# Patient Record
Sex: Male | Born: 1940 | Race: White | Hispanic: No | Marital: Married | Smoking: Former smoker
Health system: Southern US, Community
[De-identification: ages and names within clinical notes are randomized; demographics above are authoritative.]

## PROBLEM LIST (undated history)

## (undated) DIAGNOSIS — E785 Hyperlipidemia, unspecified: Secondary | ICD-10-CM

## (undated) DIAGNOSIS — E669 Obesity, unspecified: Secondary | ICD-10-CM

## (undated) DIAGNOSIS — E119 Type 2 diabetes mellitus without complications: Secondary | ICD-10-CM

## (undated) DIAGNOSIS — I6529 Occlusion and stenosis of unspecified carotid artery: Secondary | ICD-10-CM

## (undated) DIAGNOSIS — T4145XA Adverse effect of unspecified anesthetic, initial encounter: Secondary | ICD-10-CM

## (undated) DIAGNOSIS — I10 Essential (primary) hypertension: Secondary | ICD-10-CM

## (undated) DIAGNOSIS — I2510 Atherosclerotic heart disease of native coronary artery without angina pectoris: Secondary | ICD-10-CM

## (undated) DIAGNOSIS — T8859XA Other complications of anesthesia, initial encounter: Secondary | ICD-10-CM

## (undated) DIAGNOSIS — N529 Male erectile dysfunction, unspecified: Secondary | ICD-10-CM

## (undated) DIAGNOSIS — E1159 Type 2 diabetes mellitus with other circulatory complications: Secondary | ICD-10-CM

## (undated) DIAGNOSIS — I219 Acute myocardial infarction, unspecified: Secondary | ICD-10-CM

## (undated) DIAGNOSIS — I483 Typical atrial flutter: Secondary | ICD-10-CM

## (undated) DIAGNOSIS — I480 Paroxysmal atrial fibrillation: Secondary | ICD-10-CM

## (undated) DIAGNOSIS — G4733 Obstructive sleep apnea (adult) (pediatric): Secondary | ICD-10-CM

## (undated) HISTORY — DX: Obstructive sleep apnea (adult) (pediatric): G47.33

## (undated) HISTORY — DX: Occlusion and stenosis of unspecified carotid artery: I65.29

## (undated) HISTORY — PX: TONSILLECTOMY: SUR1361

## (undated) HISTORY — DX: Typical atrial flutter: I48.3

## (undated) HISTORY — PX: CARDIAC CATHETERIZATION: SHX172

## (undated) HISTORY — DX: Male erectile dysfunction, unspecified: N52.9

## (undated) HISTORY — DX: Atherosclerotic heart disease of native coronary artery without angina pectoris: I25.10

## (undated) HISTORY — DX: Hyperlipidemia, unspecified: E78.5

## (undated) HISTORY — DX: Essential (primary) hypertension: I10

## (undated) HISTORY — DX: Type 2 diabetes mellitus without complications: E11.9

## (undated) HISTORY — DX: Paroxysmal atrial fibrillation: I48.0

## (undated) HISTORY — DX: Obesity, unspecified: E66.9

## (undated) HISTORY — DX: Type 2 diabetes mellitus with other circulatory complications: E11.59

---
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
## 1963-09-03 HISTORY — PX: HAND SURGERY: SHX662

## 1991-09-03 HISTORY — PX: ANKLE FRACTURE SURGERY: SHX122

## 2000-09-02 HISTORY — PX: COLONOSCOPY: SHX174

## 2001-04-13 ENCOUNTER — Ambulatory Visit (HOSPITAL_COMMUNITY): Admission: RE | Admit: 2001-04-13 | Discharge: 2001-04-13 | Payer: Self-pay | Admitting: Internal Medicine

## 2005-05-14 ENCOUNTER — Ambulatory Visit: Payer: Self-pay | Admitting: Internal Medicine

## 2005-06-24 ENCOUNTER — Ambulatory Visit: Payer: Self-pay | Admitting: Internal Medicine

## 2005-09-02 DIAGNOSIS — I219 Acute myocardial infarction, unspecified: Secondary | ICD-10-CM

## 2005-09-02 HISTORY — DX: Acute myocardial infarction, unspecified: I21.9

## 2006-06-09 ENCOUNTER — Ambulatory Visit: Payer: Self-pay | Admitting: Internal Medicine

## 2006-06-09 LAB — CONVERTED CEMR LAB
ALT: 25 units/L (ref 0–40)
AST: 23 units/L (ref 0–37)
Albumin: 4.1 g/dL (ref 3.5–5.2)
Alkaline Phosphatase: 62 units/L (ref 39–117)
BUN: 10 mg/dL (ref 6–23)
Bacteria, U Microscopic: NEGATIVE /hpf
Basophils Absolute: 0 10*3/uL (ref 0.0–0.1)
Basophils Relative: 0.6 % (ref 0.0–1.0)
Bilirubin Urine: NEGATIVE
CO2: 29 meq/L (ref 19–32)
Calcium: 9.6 mg/dL (ref 8.4–10.5)
Chloride: 105 meq/L (ref 96–112)
Chol/HDL Ratio, serum: 4.6
Cholesterol: 181 mg/dL (ref 0–200)
Creatinine, Ser: 1.1 mg/dL (ref 0.4–1.5)
Crystals: NEGATIVE
Eosinophil percent: 1.2 % (ref 0.0–5.0)
GFR calc non Af Amer: 71 mL/min
Glomerular Filtration Rate, Af Am: 86 mL/min/{1.73_m2}
Glucose, Bld: 122 mg/dL — ABNORMAL HIGH (ref 70–99)
HCT: 46.4 % (ref 39.0–52.0)
HDL: 39.7 mg/dL (ref >39.0)
Hemoglobin, Urine: NEGATIVE
Hemoglobin: 15.3 g/dL (ref 13.0–17.0)
Hgb A1c MFr Bld: 6.1 % — ABNORMAL HIGH (ref 4.6–6.0)
Ketones, ur: NEGATIVE mg/dL
LDL Cholesterol: 108 mg/dL — ABNORMAL HIGH (ref 0–99)
Lymphocytes Relative: 31 % (ref 12.0–46.0)
MCHC: 32.9 g/dL (ref 30.0–36.0)
MCV: 88 fL (ref 78.0–100.0)
Monocytes Absolute: 0.7 10*3/uL (ref 0.2–0.7)
Monocytes Relative: 8.7 % (ref 3.0–11.0)
Neutro Abs: 4.7 10*3/uL (ref 1.4–7.7)
Neutrophils Relative %: 58.5 % (ref 43.0–77.0)
Nitrite: NEGATIVE
PSA: 1.52 ng/mL (ref 0.10–4.00)
Platelets: 247 10*3/uL (ref 150–400)
Potassium: 4 meq/L (ref 3.5–5.1)
RBC / HPF: NONE SEEN
RBC: 5.28 M/uL (ref 4.22–5.81)
RDW: 13.6 % (ref 11.5–14.6)
Sodium: 144 meq/L (ref 135–145)
Specific Gravity, Urine: 1.025 (ref 1.000–1.03)
TSH: 1.95 microintl units/mL (ref 0.35–5.50)
Total Bilirubin: 0.7 mg/dL (ref 0.3–1.2)
Total Protein, Urine: NEGATIVE mg/dL
Total Protein: 7.1 g/dL (ref 6.0–8.3)
Triglyceride fasting, serum: 167 mg/dL — ABNORMAL HIGH (ref 0–149)
Urine Glucose: NEGATIVE mg/dL
Urobilinogen, UA: 0.2 (ref 0.0–1.0)
VLDL: 33 mg/dL (ref 0–40)
WBC: 8 10*3/uL (ref 4.5–10.5)
pH: 5.5 (ref 5.0–8.0)

## 2006-06-30 ENCOUNTER — Ambulatory Visit: Payer: Self-pay | Admitting: Internal Medicine

## 2006-07-07 ENCOUNTER — Ambulatory Visit: Payer: Self-pay | Admitting: Internal Medicine

## 2006-07-08 ENCOUNTER — Ambulatory Visit: Payer: Self-pay | Admitting: Internal Medicine

## 2006-07-09 ENCOUNTER — Ambulatory Visit: Payer: Self-pay | Admitting: Internal Medicine

## 2006-07-09 ENCOUNTER — Inpatient Hospital Stay (HOSPITAL_BASED_OUTPATIENT_CLINIC_OR_DEPARTMENT_OTHER): Admission: RE | Admit: 2006-07-09 | Discharge: 2006-07-09 | Payer: Self-pay | Admitting: Internal Medicine

## 2006-07-17 ENCOUNTER — Ambulatory Visit: Payer: Self-pay | Admitting: Cardiology

## 2006-07-22 ENCOUNTER — Encounter (HOSPITAL_COMMUNITY): Admission: RE | Admit: 2006-07-22 | Discharge: 2006-10-20 | Payer: Self-pay | Admitting: Internal Medicine

## 2006-07-28 ENCOUNTER — Ambulatory Visit: Payer: Self-pay | Admitting: Internal Medicine

## 2006-08-11 ENCOUNTER — Ambulatory Visit: Payer: Self-pay | Admitting: Internal Medicine

## 2006-09-09 ENCOUNTER — Ambulatory Visit: Payer: Self-pay | Admitting: Internal Medicine

## 2006-10-13 ENCOUNTER — Ambulatory Visit: Payer: Self-pay | Admitting: Internal Medicine

## 2006-10-13 LAB — CONVERTED CEMR LAB
ALT: 31 units/L (ref 0–40)
AST: 26 units/L (ref 0–37)
Albumin: 3.8 g/dL (ref 3.5–5.2)
Alkaline Phosphatase: 54 units/L (ref 39–117)
Bilirubin, Direct: 0.1 mg/dL (ref 0.0–0.3)
Cholesterol: 100 mg/dL (ref 0–200)
HDL: 37.3 mg/dL — ABNORMAL LOW (ref >39.0)
LDL Cholesterol: 42 mg/dL (ref 0–99)
Total Bilirubin: 0.9 mg/dL (ref 0.3–1.2)
Total CHOL/HDL Ratio: 2.7
Total Protein: 6.6 g/dL (ref 6.0–8.3)
Triglycerides: 103 mg/dL (ref 0–149)
VLDL: 21 mg/dL (ref 0–40)

## 2006-10-20 ENCOUNTER — Ambulatory Visit: Payer: Self-pay | Admitting: Internal Medicine

## 2006-10-21 ENCOUNTER — Encounter (HOSPITAL_COMMUNITY): Admission: RE | Admit: 2006-10-21 | Discharge: 2006-10-27 | Payer: Self-pay | Admitting: Internal Medicine

## 2006-10-24 ENCOUNTER — Ambulatory Visit: Payer: Self-pay | Admitting: Internal Medicine

## 2006-10-24 LAB — CONVERTED CEMR LAB
BUN: 16 mg/dL (ref 6–23)
CO2: 28 meq/L (ref 19–32)
Calcium: 9.1 mg/dL (ref 8.4–10.5)
Chloride: 106 meq/L (ref 96–112)
Creatinine, Ser: 0.9 mg/dL (ref 0.4–1.5)
GFR calc Af Amer: 109 mL/min
GFR calc non Af Amer: 90 mL/min
Glucose, Bld: 115 mg/dL — ABNORMAL HIGH (ref 70–99)
Hgb A1c MFr Bld: 6.2 % — ABNORMAL HIGH (ref 4.6–6.0)
Potassium: 4 meq/L (ref 3.5–5.1)
Sodium: 140 meq/L (ref 135–145)

## 2006-10-28 ENCOUNTER — Encounter (HOSPITAL_COMMUNITY): Admission: RE | Admit: 2006-10-28 | Discharge: 2007-01-26 | Payer: Self-pay | Admitting: Internal Medicine

## 2006-11-03 ENCOUNTER — Ambulatory Visit: Payer: Self-pay | Admitting: Internal Medicine

## 2007-01-20 ENCOUNTER — Ambulatory Visit: Payer: Self-pay | Admitting: Internal Medicine

## 2007-01-21 ENCOUNTER — Ambulatory Visit: Payer: Self-pay | Admitting: Internal Medicine

## 2007-02-02 ENCOUNTER — Encounter (HOSPITAL_COMMUNITY): Admission: RE | Admit: 2007-02-02 | Discharge: 2007-05-03 | Payer: Self-pay | Admitting: Internal Medicine

## 2007-02-08 LAB — CONVERTED CEMR LAB
ALT: 29 units/L (ref 0–40)
AST: 25 units/L (ref 0–37)
Albumin: 4 g/dL (ref 3.5–5.2)
Alkaline Phosphatase: 56 units/L (ref 39–117)
BUN: 12 mg/dL (ref 6–23)
Bilirubin, Direct: 0.1 mg/dL (ref 0.0–0.3)
CO2: 27 meq/L (ref 19–32)
Calcium: 8.8 mg/dL (ref 8.4–10.5)
Chloride: 111 meq/L (ref 96–112)
Cholesterol: 111 mg/dL (ref 0–200)
Creatinine, Ser: 0.9 mg/dL (ref 0.4–1.5)
GFR calc Af Amer: 109 mL/min
GFR calc non Af Amer: 90 mL/min
Glucose, Bld: 100 mg/dL — ABNORMAL HIGH (ref 70–99)
HDL: 33.5 mg/dL — ABNORMAL LOW (ref >39.0)
Hgb A1c MFr Bld: 6.1 % — ABNORMAL HIGH (ref 4.6–6.0)
LDL Cholesterol: 54 mg/dL (ref 0–99)
Potassium: 4.1 meq/L (ref 3.5–5.1)
Sodium: 143 meq/L (ref 135–145)
Total Bilirubin: 0.9 mg/dL (ref 0.3–1.2)
Total CHOL/HDL Ratio: 3.3
Total Protein: 6.8 g/dL (ref 6.0–8.3)
Triglycerides: 118 mg/dL (ref 0–149)
VLDL: 24 mg/dL (ref 0–40)

## 2007-02-19 ENCOUNTER — Ambulatory Visit: Payer: Self-pay | Admitting: Internal Medicine

## 2007-02-19 DIAGNOSIS — E8881 Metabolic syndrome: Secondary | ICD-10-CM | POA: Insufficient documentation

## 2007-02-19 DIAGNOSIS — I2510 Atherosclerotic heart disease of native coronary artery without angina pectoris: Secondary | ICD-10-CM | POA: Insufficient documentation

## 2007-05-04 ENCOUNTER — Encounter (HOSPITAL_COMMUNITY): Admission: RE | Admit: 2007-05-04 | Discharge: 2007-08-02 | Payer: Self-pay | Admitting: Internal Medicine

## 2007-05-07 ENCOUNTER — Ambulatory Visit: Payer: Self-pay | Admitting: Internal Medicine

## 2007-05-07 ENCOUNTER — Ambulatory Visit: Payer: Self-pay

## 2007-05-07 ENCOUNTER — Encounter: Payer: Self-pay | Admitting: Internal Medicine

## 2007-05-11 ENCOUNTER — Ambulatory Visit: Payer: Self-pay | Admitting: Internal Medicine

## 2007-05-11 ENCOUNTER — Ambulatory Visit: Payer: Self-pay

## 2007-05-29 ENCOUNTER — Encounter (INDEPENDENT_AMBULATORY_CARE_PROVIDER_SITE_OTHER): Payer: Self-pay | Admitting: *Deleted

## 2007-05-29 LAB — CONVERTED CEMR LAB
ALT: 35 units/L (ref 0–53)
AST: 29 units/L (ref 0–37)
Albumin: 3.9 g/dL (ref 3.5–5.2)
Alkaline Phosphatase: 61 units/L (ref 39–117)
BUN: 11 mg/dL (ref 6–23)
Bilirubin, Direct: 0.2 mg/dL (ref 0.0–0.3)
CO2: 29 meq/L (ref 19–32)
Calcium: 9.3 mg/dL (ref 8.4–10.5)
Chloride: 108 meq/L (ref 96–112)
Cholesterol: 108 mg/dL (ref 0–200)
Creatinine, Ser: 0.9 mg/dL (ref 0.4–1.5)
GFR calc Af Amer: 109 mL/min
GFR calc non Af Amer: 90 mL/min
Glucose, Bld: 105 mg/dL — ABNORMAL HIGH (ref 70–99)
HDL: 43.3 mg/dL (ref >39.0)
LDL Cholesterol: 47 mg/dL (ref 0–99)
Potassium: 4.1 meq/L (ref 3.5–5.1)
Sodium: 142 meq/L (ref 135–145)
Total Bilirubin: 0.9 mg/dL (ref 0.3–1.2)
Total CHOL/HDL Ratio: 2.5
Total Protein: 6.6 g/dL (ref 6.0–8.3)
Triglycerides: 90 mg/dL (ref 0–149)
VLDL: 18 mg/dL (ref 0–40)

## 2007-06-11 ENCOUNTER — Ambulatory Visit: Payer: Self-pay

## 2007-06-14 LAB — CONVERTED CEMR LAB
Hgb A1c MFr Bld: 6 % (ref 4.6–6.0)
PSA: 1.29 ng/mL (ref 0.10–4.00)

## 2007-06-15 ENCOUNTER — Encounter (INDEPENDENT_AMBULATORY_CARE_PROVIDER_SITE_OTHER): Payer: Self-pay | Admitting: *Deleted

## 2007-07-23 ENCOUNTER — Ambulatory Visit: Payer: Self-pay | Admitting: Internal Medicine

## 2007-07-23 DIAGNOSIS — F528 Other sexual dysfunction not due to a substance or known physiological condition: Secondary | ICD-10-CM

## 2007-07-23 DIAGNOSIS — N410 Acute prostatitis: Secondary | ICD-10-CM | POA: Insufficient documentation

## 2007-07-23 DIAGNOSIS — R300 Dysuria: Secondary | ICD-10-CM | POA: Insufficient documentation

## 2007-07-23 HISTORY — DX: Other sexual dysfunction not due to a substance or known physiological condition: F52.8

## 2007-07-23 LAB — CONVERTED CEMR LAB
Bilirubin Urine: NEGATIVE
Glucose, Urine, Semiquant: NEGATIVE
Ketones, urine, test strip: NEGATIVE
Nitrite: NEGATIVE
Protein, U semiquant: NEGATIVE
Specific Gravity, Urine: 1.02
Urobilinogen, UA: NEGATIVE
pH: 5

## 2007-07-24 ENCOUNTER — Encounter: Payer: Self-pay | Admitting: Internal Medicine

## 2007-07-28 ENCOUNTER — Telehealth (INDEPENDENT_AMBULATORY_CARE_PROVIDER_SITE_OTHER): Payer: Self-pay | Admitting: *Deleted

## 2007-08-03 ENCOUNTER — Encounter (HOSPITAL_COMMUNITY): Admission: RE | Admit: 2007-08-03 | Discharge: 2007-09-01 | Payer: Self-pay | Admitting: Internal Medicine

## 2007-08-10 ENCOUNTER — Ambulatory Visit: Payer: Self-pay | Admitting: Internal Medicine

## 2007-08-10 DIAGNOSIS — N390 Urinary tract infection, site not specified: Secondary | ICD-10-CM | POA: Insufficient documentation

## 2007-08-10 LAB — CONVERTED CEMR LAB

## 2007-08-11 ENCOUNTER — Encounter: Payer: Self-pay | Admitting: Internal Medicine

## 2007-08-17 ENCOUNTER — Ambulatory Visit: Payer: Self-pay | Admitting: Internal Medicine

## 2007-09-03 ENCOUNTER — Encounter (HOSPITAL_COMMUNITY): Admission: RE | Admit: 2007-09-03 | Discharge: 2007-11-26 | Payer: Self-pay | Admitting: Internal Medicine

## 2007-09-09 ENCOUNTER — Ambulatory Visit: Payer: Self-pay | Admitting: Internal Medicine

## 2007-09-09 LAB — CONVERTED CEMR LAB
ALT: 34 units/L (ref 0–53)
AST: 31 units/L (ref 0–37)
Albumin: 4.2 g/dL (ref 3.5–5.2)
Alkaline Phosphatase: 60 units/L (ref 39–117)
BUN: 13 mg/dL (ref 6–23)
Bilirubin, Direct: 0.1 mg/dL (ref 0.0–0.3)
CO2: 27 meq/L (ref 19–32)
Calcium: 9.3 mg/dL (ref 8.4–10.5)
Chloride: 105 meq/L (ref 96–112)
Cholesterol: 121 mg/dL (ref 0–200)
Creatinine, Ser: 1 mg/dL (ref 0.4–1.5)
GFR calc Af Amer: 96 mL/min
GFR calc non Af Amer: 79 mL/min
Glucose, Bld: 102 mg/dL — ABNORMAL HIGH (ref 70–99)
HDL: 44.7 mg/dL (ref >39.0)
Hgb A1c MFr Bld: 6.1 % — ABNORMAL HIGH (ref 4.6–6.0)
LDL Cholesterol: 49 mg/dL (ref 0–99)
Potassium: 3.9 meq/L (ref 3.5–5.1)
Sodium: 140 meq/L (ref 135–145)
Total Bilirubin: 1.2 mg/dL (ref 0.3–1.2)
Total CHOL/HDL Ratio: 2.7
Total Protein: 7 g/dL (ref 6.0–8.3)
Triglycerides: 137 mg/dL (ref 0–149)
VLDL: 27 mg/dL (ref 0–40)

## 2007-09-14 ENCOUNTER — Ambulatory Visit: Payer: Self-pay | Admitting: Internal Medicine

## 2007-09-28 ENCOUNTER — Encounter: Payer: Self-pay | Admitting: Internal Medicine

## 2007-12-02 ENCOUNTER — Encounter (HOSPITAL_COMMUNITY): Admission: RE | Admit: 2007-12-02 | Discharge: 2008-03-01 | Payer: Self-pay | Admitting: Internal Medicine

## 2007-12-14 ENCOUNTER — Ambulatory Visit: Payer: Self-pay | Admitting: Internal Medicine

## 2007-12-14 LAB — CONVERTED CEMR LAB
ALT: 26 units/L (ref 0–53)
AST: 23 units/L (ref 0–37)
Albumin: 3.8 g/dL (ref 3.5–5.2)
Alkaline Phosphatase: 54 units/L (ref 39–117)
BUN: 14 mg/dL (ref 6–23)
Bilirubin, Direct: 0.1 mg/dL (ref 0.0–0.3)
CO2: 28 meq/L (ref 19–32)
Calcium: 9.1 mg/dL (ref 8.4–10.5)
Chloride: 107 meq/L (ref 96–112)
Cholesterol: 96 mg/dL (ref 0–200)
Creatinine, Ser: 1 mg/dL (ref 0.4–1.5)
GFR calc Af Amer: 96 mL/min
GFR calc non Af Amer: 79 mL/min
Glucose, Bld: 111 mg/dL — ABNORMAL HIGH (ref 70–99)
HDL: 38.4 mg/dL — ABNORMAL LOW (ref >39.0)
LDL Cholesterol: 43 mg/dL (ref 0–99)
Potassium: 4.2 meq/L (ref 3.5–5.1)
Sodium: 143 meq/L (ref 135–145)
Total Bilirubin: 0.9 mg/dL (ref 0.3–1.2)
Total CHOL/HDL Ratio: 2.5
Total Protein: 6.6 g/dL (ref 6.0–8.3)
Triglycerides: 74 mg/dL (ref 0–149)
VLDL: 15 mg/dL (ref 0–40)

## 2007-12-18 ENCOUNTER — Ambulatory Visit: Payer: Self-pay | Admitting: Internal Medicine

## 2008-01-08 ENCOUNTER — Ambulatory Visit: Payer: Self-pay | Admitting: Internal Medicine

## 2008-01-08 DIAGNOSIS — S3981XA Other specified injuries of abdomen, initial encounter: Secondary | ICD-10-CM | POA: Insufficient documentation

## 2008-01-08 DIAGNOSIS — K439 Ventral hernia without obstruction or gangrene: Secondary | ICD-10-CM | POA: Insufficient documentation

## 2008-01-08 HISTORY — DX: Ventral hernia without obstruction or gangrene: K43.9

## 2008-03-02 ENCOUNTER — Encounter (HOSPITAL_COMMUNITY): Admission: RE | Admit: 2008-03-02 | Discharge: 2008-05-31 | Payer: Self-pay | Admitting: Internal Medicine

## 2008-03-21 ENCOUNTER — Ambulatory Visit: Payer: Self-pay | Admitting: Internal Medicine

## 2008-03-21 LAB — CONVERTED CEMR LAB
ALT: 32 units/L (ref 0–53)
AST: 26 units/L (ref 0–37)
Albumin: 3.9 g/dL (ref 3.5–5.2)
Alkaline Phosphatase: 67 units/L (ref 39–117)
BUN: 13 mg/dL (ref 6–23)
Bilirubin, Direct: 0.1 mg/dL (ref 0.0–0.3)
CO2: 29 meq/L (ref 19–32)
Calcium: 9 mg/dL (ref 8.4–10.5)
Chloride: 108 meq/L (ref 96–112)
Cholesterol: 109 mg/dL (ref 0–200)
Creatinine, Ser: 0.9 mg/dL (ref 0.4–1.5)
Direct LDL: 50.7 mg/dL
GFR calc Af Amer: 108 mL/min
GFR calc non Af Amer: 89 mL/min
Glucose, Bld: 117 mg/dL — ABNORMAL HIGH (ref 70–99)
HDL: 38.9 mg/dL — ABNORMAL LOW (ref >39.0)
Hgb A1c MFr Bld: 6.1 % — ABNORMAL HIGH (ref 4.6–6.0)
PSA: 1.44 ng/mL (ref 0.10–4.00)
Potassium: 4.1 meq/L (ref 3.5–5.1)
Sodium: 142 meq/L (ref 135–145)
Total Bilirubin: 1.2 mg/dL (ref 0.3–1.2)
Total CHOL/HDL Ratio: 2.8
Total Protein: 6.7 g/dL (ref 6.0–8.3)
Triglycerides: 94 mg/dL (ref 0–149)
VLDL: 19 mg/dL (ref 0–40)

## 2008-03-22 ENCOUNTER — Ambulatory Visit: Payer: Self-pay | Admitting: Internal Medicine

## 2008-03-24 ENCOUNTER — Ambulatory Visit: Payer: Self-pay | Admitting: Internal Medicine

## 2008-06-02 ENCOUNTER — Encounter (HOSPITAL_COMMUNITY): Admission: RE | Admit: 2008-06-02 | Discharge: 2008-08-29 | Payer: Self-pay | Admitting: Internal Medicine

## 2008-07-21 ENCOUNTER — Ambulatory Visit: Payer: Self-pay | Admitting: Internal Medicine

## 2008-07-21 LAB — CONVERTED CEMR LAB
ALT: 27 units/L (ref 0–53)
AST: 24 units/L (ref 0–37)
Albumin: 4 g/dL (ref 3.5–5.2)
Alkaline Phosphatase: 62 units/L (ref 39–117)
BUN: 13 mg/dL (ref 6–23)
Bilirubin, Direct: 0.1 mg/dL (ref 0.0–0.3)
CO2: 29 meq/L (ref 19–32)
Calcium: 9.3 mg/dL (ref 8.4–10.5)
Chloride: 106 meq/L (ref 96–112)
Creatinine, Ser: 0.9 mg/dL (ref 0.4–1.5)
GFR calc Af Amer: 108 mL/min
GFR calc non Af Amer: 89 mL/min
Glucose, Bld: 95 mg/dL (ref 70–99)
Potassium: 4.1 meq/L (ref 3.5–5.1)
Sodium: 141 meq/L (ref 135–145)
Total Bilirubin: 0.9 mg/dL (ref 0.3–1.2)
Total Protein: 6.7 g/dL (ref 6.0–8.3)

## 2008-07-25 ENCOUNTER — Ambulatory Visit: Payer: Self-pay | Admitting: Internal Medicine

## 2008-07-29 LAB — CONVERTED CEMR LAB
ALT: 27 units/L (ref 0–53)
AST: 24 units/L (ref 0–37)
Albumin: 4 g/dL (ref 3.5–5.2)
Alkaline Phosphatase: 62 units/L (ref 39–117)
BUN: 13 mg/dL (ref 6–23)
Bilirubin, Direct: 0.1 mg/dL (ref 0.0–0.3)
CO2: 29 meq/L (ref 19–32)
Calcium: 9.3 mg/dL (ref 8.4–10.5)
Chloride: 106 meq/L (ref 96–112)
Cholesterol: 108 mg/dL (ref 0–200)
Creatinine, Ser: 0.9 mg/dL (ref 0.4–1.5)
Direct LDL: 39.9 mg/dL
GFR calc Af Amer: 108 mL/min
GFR calc non Af Amer: 89 mL/min
Glucose, Bld: 95 mg/dL (ref 70–99)
HDL: 42.4 mg/dL (ref >39.0)
LDL Cholesterol: 41 mg/dL (ref 0–99)
Potassium: 4.1 meq/L (ref 3.5–5.1)
Sodium: 141 meq/L (ref 135–145)
Total Bilirubin: 0.9 mg/dL (ref 0.3–1.2)
Total CHOL/HDL Ratio: 2.5
Total Protein: 6.7 g/dL (ref 6.0–8.3)
Triglycerides: 123 mg/dL (ref 0–149)
VLDL: 25 mg/dL (ref 0–40)

## 2008-09-02 ENCOUNTER — Encounter (HOSPITAL_COMMUNITY): Admission: RE | Admit: 2008-09-02 | Discharge: 2008-12-01 | Payer: Self-pay | Admitting: Internal Medicine

## 2008-10-17 ENCOUNTER — Ambulatory Visit: Payer: Self-pay | Admitting: Internal Medicine

## 2008-10-20 ENCOUNTER — Ambulatory Visit: Payer: Self-pay | Admitting: Internal Medicine

## 2008-10-20 ENCOUNTER — Encounter: Payer: Self-pay | Admitting: Internal Medicine

## 2008-10-20 DIAGNOSIS — I10 Essential (primary) hypertension: Secondary | ICD-10-CM | POA: Insufficient documentation

## 2008-10-20 DIAGNOSIS — I2510 Atherosclerotic heart disease of native coronary artery without angina pectoris: Secondary | ICD-10-CM | POA: Insufficient documentation

## 2008-10-20 DIAGNOSIS — E785 Hyperlipidemia, unspecified: Secondary | ICD-10-CM | POA: Insufficient documentation

## 2008-10-20 HISTORY — DX: Atherosclerotic heart disease of native coronary artery without angina pectoris: I25.10

## 2008-10-20 HISTORY — DX: Hyperlipidemia, unspecified: E78.5

## 2008-11-14 LAB — CONVERTED CEMR LAB
ALT: 23 units/L (ref 0–53)
AST: 24 units/L (ref 0–37)
Albumin: 3.9 g/dL (ref 3.5–5.2)
Alkaline Phosphatase: 53 units/L (ref 39–117)
BUN: 12 mg/dL (ref 6–23)
Bilirubin, Direct: 0.1 mg/dL (ref 0.0–0.3)
CO2: 29 meq/L (ref 19–32)
Calcium: 9.3 mg/dL (ref 8.4–10.5)
Chloride: 108 meq/L (ref 96–112)
Cholesterol: 104 mg/dL (ref 0–200)
Creatinine, Ser: 0.9 mg/dL (ref 0.4–1.5)
Creatinine,U: 170.6 mg/dL
GFR calc Af Amer: 108 mL/min
GFR calc non Af Amer: 89 mL/min
Glucose, Bld: 111 mg/dL — ABNORMAL HIGH (ref 70–99)
HDL: 39.4 mg/dL (ref >39.0)
Hgb A1c MFr Bld: 6.1 % — ABNORMAL HIGH (ref 4.6–6.0)
LDL Cholesterol: 42 mg/dL (ref 0–99)
Microalb Creat Ratio: 4.7 mg/g (ref 0.0–30.0)
Microalb, Ur: 0.8 mg/dL (ref 0.0–1.9)
Potassium: 4.3 meq/L (ref 3.5–5.1)
Sodium: 143 meq/L (ref 135–145)
Total Bilirubin: 0.9 mg/dL (ref 0.3–1.2)
Total CHOL/HDL Ratio: 2.6
Total Protein: 6.8 g/dL (ref 6.0–8.3)
Triglycerides: 111 mg/dL (ref 0–149)
VLDL: 22 mg/dL (ref 0–40)

## 2008-12-01 ENCOUNTER — Ambulatory Visit: Payer: Self-pay | Admitting: Internal Medicine

## 2008-12-01 LAB — CONVERTED CEMR LAB
BUN: 14 mg/dL (ref 6–23)
CO2: 29 meq/L (ref 19–32)
Calcium: 8.8 mg/dL (ref 8.4–10.5)
Chloride: 106 meq/L (ref 96–112)
Creatinine, Ser: 0.9 mg/dL (ref 0.4–1.5)
GFR calc non Af Amer: 89.21 mL/min (ref >60)
Glucose, Bld: 116 mg/dL — ABNORMAL HIGH (ref 70–99)
Potassium: 3.7 meq/L (ref 3.5–5.1)
Sodium: 141 meq/L (ref 135–145)

## 2008-12-02 ENCOUNTER — Encounter (HOSPITAL_COMMUNITY): Admission: RE | Admit: 2008-12-02 | Discharge: 2009-03-02 | Payer: Self-pay | Admitting: Internal Medicine

## 2008-12-10 ENCOUNTER — Encounter: Payer: Self-pay | Admitting: Internal Medicine

## 2009-01-04 ENCOUNTER — Ambulatory Visit: Payer: Self-pay | Admitting: Internal Medicine

## 2009-01-04 LAB — CONVERTED CEMR LAB
ALT: 22 units/L (ref 0–53)
AST: 25 units/L (ref 0–37)
Albumin: 3.8 g/dL (ref 3.5–5.2)
Alkaline Phosphatase: 70 units/L (ref 39–117)
BUN: 12 mg/dL (ref 6–23)
Bilirubin, Direct: 0.1 mg/dL (ref 0.0–0.3)
CO2: 28 meq/L (ref 19–32)
Calcium: 8.8 mg/dL (ref 8.4–10.5)
Chloride: 111 meq/L (ref 96–112)
Cholesterol: 107 mg/dL (ref 0–200)
Creatinine, Ser: 0.8 mg/dL (ref 0.4–1.5)
GFR calc non Af Amer: 102.17 mL/min (ref >60)
Glucose, Bld: 106 mg/dL — ABNORMAL HIGH (ref 70–99)
HDL: 47.4 mg/dL (ref >39.00)
LDL Cholesterol: 42 mg/dL (ref 0–99)
PSA: 1.7 ng/mL (ref 0.10–4.00)
Potassium: 4.2 meq/L (ref 3.5–5.1)
Sodium: 144 meq/L (ref 135–145)
Total Bilirubin: 1.1 mg/dL (ref 0.3–1.2)
Total CHOL/HDL Ratio: 2
Total Protein: 6.9 g/dL (ref 6.0–8.3)
Triglycerides: 88 mg/dL (ref 0.0–149.0)
VLDL: 17.6 mg/dL (ref 0.0–40.0)

## 2009-01-05 ENCOUNTER — Encounter (INDEPENDENT_AMBULATORY_CARE_PROVIDER_SITE_OTHER): Payer: Self-pay | Admitting: *Deleted

## 2009-01-06 ENCOUNTER — Encounter: Payer: Self-pay | Admitting: Internal Medicine

## 2009-01-09 ENCOUNTER — Encounter (INDEPENDENT_AMBULATORY_CARE_PROVIDER_SITE_OTHER): Payer: Self-pay | Admitting: *Deleted

## 2009-01-09 ENCOUNTER — Telehealth (INDEPENDENT_AMBULATORY_CARE_PROVIDER_SITE_OTHER): Payer: Self-pay | Admitting: *Deleted

## 2009-01-09 ENCOUNTER — Ambulatory Visit: Payer: Self-pay | Admitting: Internal Medicine

## 2009-02-16 ENCOUNTER — Encounter: Payer: Self-pay | Admitting: Internal Medicine

## 2009-03-03 ENCOUNTER — Encounter (HOSPITAL_COMMUNITY): Admission: RE | Admit: 2009-03-03 | Discharge: 2009-06-01 | Payer: Self-pay | Admitting: Internal Medicine

## 2009-04-17 ENCOUNTER — Ambulatory Visit: Payer: Self-pay | Admitting: Internal Medicine

## 2009-04-20 LAB — CONVERTED CEMR LAB
ALT: 25 units/L (ref 0–53)
AST: 25 units/L (ref 0–37)
Albumin: 3.8 g/dL (ref 3.5–5.2)
Alkaline Phosphatase: 57 units/L (ref 39–117)
BUN: 16 mg/dL (ref 6–23)
Bilirubin, Direct: 0.1 mg/dL (ref 0.0–0.3)
CO2: 28 meq/L (ref 19–32)
Calcium: 9 mg/dL (ref 8.4–10.5)
Chloride: 109 meq/L (ref 96–112)
Cholesterol: 101 mg/dL (ref 0–200)
Creatinine, Ser: 0.9 mg/dL (ref 0.4–1.5)
GFR calc non Af Amer: 89.11 mL/min (ref >60)
Glucose, Bld: 106 mg/dL — ABNORMAL HIGH (ref 70–99)
HDL: 41.7 mg/dL (ref >39.00)
Hgb A1c MFr Bld: 6 % (ref 4.6–6.5)
LDL Cholesterol: 43 mg/dL (ref 0–99)
Potassium: 4.1 meq/L (ref 3.5–5.1)
Sodium: 142 meq/L (ref 135–145)
Total Bilirubin: 1.2 mg/dL (ref 0.3–1.2)
Total CHOL/HDL Ratio: 2
Total Protein: 6.9 g/dL (ref 6.0–8.3)
Triglycerides: 80 mg/dL (ref 0.0–149.0)
VLDL: 16 mg/dL (ref 0.0–40.0)

## 2009-04-21 ENCOUNTER — Encounter: Payer: Self-pay | Admitting: Internal Medicine

## 2009-05-15 ENCOUNTER — Ambulatory Visit: Payer: Self-pay | Admitting: Internal Medicine

## 2009-06-02 ENCOUNTER — Encounter (HOSPITAL_COMMUNITY): Admission: RE | Admit: 2009-06-02 | Discharge: 2009-08-31 | Payer: Self-pay | Admitting: Internal Medicine

## 2009-08-21 ENCOUNTER — Telehealth (INDEPENDENT_AMBULATORY_CARE_PROVIDER_SITE_OTHER): Payer: Self-pay | Admitting: *Deleted

## 2009-09-02 ENCOUNTER — Encounter (HOSPITAL_COMMUNITY): Admission: RE | Admit: 2009-09-02 | Discharge: 2009-12-01 | Payer: Self-pay | Admitting: Internal Medicine

## 2009-09-04 ENCOUNTER — Ambulatory Visit: Payer: Self-pay | Admitting: Internal Medicine

## 2009-09-11 ENCOUNTER — Ambulatory Visit: Payer: Self-pay | Admitting: Internal Medicine

## 2009-09-15 ENCOUNTER — Ambulatory Visit: Payer: Self-pay | Admitting: Internal Medicine

## 2009-09-15 LAB — CONVERTED CEMR LAB
ALT: 30 units/L (ref 0–53)
AST: 27 units/L (ref 0–37)
Albumin: 3.9 g/dL (ref 3.5–5.2)
Alkaline Phosphatase: 60 units/L (ref 39–117)
BUN: 11 mg/dL (ref 6–23)
Bilirubin, Direct: 0.1 mg/dL (ref 0.0–0.3)
CO2: 27 meq/L (ref 19–32)
Calcium: 9.3 mg/dL (ref 8.4–10.5)
Chloride: 108 meq/L (ref 96–112)
Cholesterol: 113 mg/dL (ref 0–200)
Creatinine, Ser: 1 mg/dL (ref 0.4–1.5)
GFR calc non Af Amer: 78.82 mL/min (ref >60)
Glucose, Bld: 106 mg/dL — ABNORMAL HIGH (ref 70–99)
HDL: 45.2 mg/dL (ref >39.00)
Hgb A1c MFr Bld: 6.2 % (ref 4.6–6.5)
LDL Cholesterol: 43 mg/dL (ref 0–99)
Potassium: 4 meq/L (ref 3.5–5.1)
Sodium: 144 meq/L (ref 135–145)
Total Bilirubin: 1 mg/dL (ref 0.3–1.2)
Total CHOL/HDL Ratio: 3
Total Protein: 7.2 g/dL (ref 6.0–8.3)
Triglycerides: 125 mg/dL (ref 0.0–149.0)
VLDL: 25 mg/dL (ref 0.0–40.0)

## 2009-09-19 ENCOUNTER — Encounter: Payer: Self-pay | Admitting: Internal Medicine

## 2009-11-01 ENCOUNTER — Telehealth (INDEPENDENT_AMBULATORY_CARE_PROVIDER_SITE_OTHER): Payer: Self-pay | Admitting: *Deleted

## 2009-12-02 ENCOUNTER — Encounter (HOSPITAL_COMMUNITY): Admission: RE | Admit: 2009-12-02 | Discharge: 2010-03-02 | Payer: Self-pay | Admitting: Internal Medicine

## 2009-12-25 ENCOUNTER — Ambulatory Visit: Payer: Self-pay | Admitting: Internal Medicine

## 2010-01-01 ENCOUNTER — Ambulatory Visit: Payer: Self-pay | Admitting: Internal Medicine

## 2010-01-01 DIAGNOSIS — I498 Other specified cardiac arrhythmias: Secondary | ICD-10-CM | POA: Insufficient documentation

## 2010-01-01 LAB — CONVERTED CEMR LAB
ALT: 21 units/L (ref 0–53)
AST: 22 units/L (ref 0–37)
Albumin: 4 g/dL (ref 3.5–5.2)
Alkaline Phosphatase: 63 units/L (ref 39–117)
BUN: 12 mg/dL (ref 6–23)
Bilirubin, Direct: 0 mg/dL (ref 0.0–0.3)
CO2: 26 meq/L (ref 19–32)
Calcium: 8.9 mg/dL (ref 8.4–10.5)
Chloride: 108 meq/L (ref 96–112)
Cholesterol: 100 mg/dL (ref 0–200)
Creatinine, Ser: 0.9 mg/dL (ref 0.4–1.5)
GFR calc non Af Amer: 88.93 mL/min (ref >60)
Glucose, Bld: 104 mg/dL — ABNORMAL HIGH (ref 70–99)
HDL: 44.5 mg/dL (ref >39.00)
Hgb A1c MFr Bld: 6.2 % (ref 4.6–6.5)
LDL Cholesterol: 29 mg/dL (ref 0–99)
Potassium: 3.9 meq/L (ref 3.5–5.1)
Sodium: 143 meq/L (ref 135–145)
Total Bilirubin: 0.8 mg/dL (ref 0.3–1.2)
Total CHOL/HDL Ratio: 2
Total Protein: 6.5 g/dL (ref 6.0–8.3)
Triglycerides: 132 mg/dL (ref 0.0–149.0)
VLDL: 26.4 mg/dL (ref 0.0–40.0)

## 2010-01-08 ENCOUNTER — Telehealth (INDEPENDENT_AMBULATORY_CARE_PROVIDER_SITE_OTHER): Payer: Self-pay | Admitting: *Deleted

## 2010-01-09 ENCOUNTER — Encounter (HOSPITAL_COMMUNITY): Admission: RE | Admit: 2010-01-09 | Discharge: 2010-03-02 | Payer: Self-pay | Admitting: Internal Medicine

## 2010-01-09 ENCOUNTER — Encounter: Payer: Self-pay | Admitting: Internal Medicine

## 2010-01-09 ENCOUNTER — Ambulatory Visit: Payer: Self-pay | Admitting: Cardiology

## 2010-01-09 ENCOUNTER — Ambulatory Visit: Payer: Self-pay

## 2010-01-11 ENCOUNTER — Telehealth: Payer: Self-pay | Admitting: Internal Medicine

## 2010-01-11 ENCOUNTER — Encounter: Payer: Self-pay | Admitting: Internal Medicine

## 2010-01-15 ENCOUNTER — Ambulatory Visit: Payer: Self-pay | Admitting: Internal Medicine

## 2010-01-15 LAB — CONVERTED CEMR LAB
BUN: 16 mg/dL (ref 6–23)
Basophils Absolute: 0 10*3/uL (ref 0.0–0.1)
Basophils Relative: 0.6 % (ref 0.0–3.0)
CO2: 29 meq/L (ref 19–32)
Calcium: 9.1 mg/dL (ref 8.4–10.5)
Chloride: 107 meq/L (ref 96–112)
Creatinine, Ser: 0.8 mg/dL (ref 0.4–1.5)
Eosinophils Absolute: 0.2 10*3/uL (ref 0.0–0.7)
Eosinophils Relative: 3 % (ref 0.0–5.0)
GFR calc non Af Amer: 108.07 mL/min (ref >60)
Glucose, Bld: 107 mg/dL — ABNORMAL HIGH (ref 70–99)
HCT: 41 % (ref 39.0–52.0)
Hemoglobin: 14 g/dL (ref 13.0–17.0)
INR: 1.1 — ABNORMAL HIGH (ref 0.8–1.0)
Lymphocytes Relative: 31.2 % (ref 12.0–46.0)
Lymphs Abs: 2.4 10*3/uL (ref 0.7–4.0)
MCHC: 34.2 g/dL (ref 30.0–36.0)
MCV: 89.1 fL (ref 78.0–100.0)
Monocytes Absolute: 0.9 10*3/uL (ref 0.1–1.0)
Monocytes Relative: 12 % (ref 3.0–12.0)
Neutro Abs: 4.1 10*3/uL (ref 1.4–7.7)
Neutrophils Relative %: 53.2 % (ref 43.0–77.0)
Platelets: 189 10*3/uL (ref 150.0–400.0)
Potassium: 4 meq/L (ref 3.5–5.1)
Prothrombin Time: 11.6 s (ref 9.1–11.7)
RBC: 4.6 M/uL (ref 4.22–5.81)
RDW: 14 % (ref 11.5–14.6)
Sodium: 142 meq/L (ref 135–145)
WBC: 7.8 10*3/uL (ref 4.5–10.5)

## 2010-01-16 ENCOUNTER — Telehealth (INDEPENDENT_AMBULATORY_CARE_PROVIDER_SITE_OTHER): Payer: Self-pay | Admitting: *Deleted

## 2010-01-19 ENCOUNTER — Inpatient Hospital Stay (HOSPITAL_BASED_OUTPATIENT_CLINIC_OR_DEPARTMENT_OTHER): Admission: RE | Admit: 2010-01-19 | Discharge: 2010-01-19 | Payer: Self-pay | Admitting: Internal Medicine

## 2010-01-19 ENCOUNTER — Ambulatory Visit: Payer: Self-pay | Admitting: Internal Medicine

## 2010-01-19 ENCOUNTER — Encounter: Payer: Self-pay | Admitting: Internal Medicine

## 2010-01-31 ENCOUNTER — Ambulatory Visit: Payer: Self-pay | Admitting: Internal Medicine

## 2010-01-31 DIAGNOSIS — I4949 Other premature depolarization: Secondary | ICD-10-CM

## 2010-01-31 HISTORY — DX: Other premature depolarization: I49.49

## 2010-03-03 ENCOUNTER — Encounter (HOSPITAL_COMMUNITY): Admission: RE | Admit: 2010-03-03 | Discharge: 2010-06-01 | Payer: Self-pay | Admitting: Internal Medicine

## 2010-04-19 ENCOUNTER — Telehealth (INDEPENDENT_AMBULATORY_CARE_PROVIDER_SITE_OTHER): Payer: Self-pay | Admitting: *Deleted

## 2010-06-01 ENCOUNTER — Ambulatory Visit: Payer: Self-pay | Admitting: Internal Medicine

## 2010-06-02 ENCOUNTER — Encounter (HOSPITAL_COMMUNITY)
Admission: RE | Admit: 2010-06-02 | Discharge: 2010-08-31 | Payer: Self-pay | Source: Home / Self Care | Attending: Internal Medicine | Admitting: Internal Medicine

## 2010-06-05 ENCOUNTER — Encounter: Payer: Self-pay | Admitting: Internal Medicine

## 2010-06-07 ENCOUNTER — Ambulatory Visit: Payer: Self-pay | Admitting: Internal Medicine

## 2010-06-07 DIAGNOSIS — R0609 Other forms of dyspnea: Secondary | ICD-10-CM | POA: Insufficient documentation

## 2010-06-07 DIAGNOSIS — R0989 Other specified symptoms and signs involving the circulatory and respiratory systems: Secondary | ICD-10-CM

## 2010-07-13 LAB — CONVERTED CEMR LAB
ALT: 23 units/L (ref 0–53)
AST: 26 units/L (ref 0–37)
Albumin: 4.1 g/dL (ref 3.5–5.2)
Alkaline Phosphatase: 67 units/L (ref 39–117)
BUN: 17 mg/dL (ref 6–23)
Bilirubin, Direct: 0.2 mg/dL (ref 0.0–0.3)
CO2: 27 meq/L (ref 19–32)
Calcium: 9 mg/dL (ref 8.4–10.5)
Chloride: 108 meq/L (ref 96–112)
Cholesterol: 96 mg/dL (ref 0–200)
Creatinine, Ser: 1 mg/dL (ref 0.4–1.5)
GFR calc non Af Amer: 82.44 mL/min (ref >60)
Glucose, Bld: 109 mg/dL — ABNORMAL HIGH (ref 70–99)
HDL: 41.3 mg/dL (ref >39.00)
Hemoglobin: 13.5 g/dL (ref 13.0–17.0)
LDL Cholesterol: 38 mg/dL (ref 0–99)
Potassium: 4.1 meq/L (ref 3.5–5.1)
Sodium: 142 meq/L (ref 135–145)
Total Bilirubin: 0.9 mg/dL (ref 0.3–1.2)
Total CHOL/HDL Ratio: 2
Total Protein: 6.6 g/dL (ref 6.0–8.3)
Triglycerides: 85 mg/dL (ref 0.0–149.0)
VLDL: 17 mg/dL (ref 0.0–40.0)

## 2010-07-16 ENCOUNTER — Ambulatory Visit: Payer: Self-pay | Admitting: Internal Medicine

## 2010-07-16 LAB — CONVERTED CEMR LAB
Creatinine,U: 165.6 mg/dL
Hgb A1c MFr Bld: 6.2 % (ref 4.6–6.5)
Microalb Creat Ratio: 0.5 mg/g (ref 0.0–30.0)
Microalb, Ur: 0.9 mg/dL (ref 0.0–1.9)

## 2010-07-19 ENCOUNTER — Ambulatory Visit: Payer: Self-pay | Admitting: Internal Medicine

## 2010-07-19 DIAGNOSIS — G4733 Obstructive sleep apnea (adult) (pediatric): Secondary | ICD-10-CM

## 2010-07-19 HISTORY — DX: Obstructive sleep apnea (adult) (pediatric): G47.33

## 2010-07-20 ENCOUNTER — Encounter: Payer: Self-pay | Admitting: Internal Medicine

## 2010-07-20 ENCOUNTER — Ambulatory Visit (HOSPITAL_BASED_OUTPATIENT_CLINIC_OR_DEPARTMENT_OTHER)
Admission: RE | Admit: 2010-07-20 | Discharge: 2010-07-20 | Payer: Self-pay | Source: Home / Self Care | Admitting: Internal Medicine

## 2010-07-23 ENCOUNTER — Ambulatory Visit: Payer: Self-pay | Admitting: Internal Medicine

## 2010-07-23 DIAGNOSIS — R7303 Prediabetes: Secondary | ICD-10-CM | POA: Insufficient documentation

## 2010-07-28 ENCOUNTER — Ambulatory Visit: Payer: Self-pay | Admitting: Internal Medicine

## 2010-08-28 ENCOUNTER — Ambulatory Visit: Payer: Self-pay | Admitting: Internal Medicine

## 2010-09-04 ENCOUNTER — Encounter (HOSPITAL_COMMUNITY)
Admission: RE | Admit: 2010-09-04 | Discharge: 2010-10-02 | Payer: Self-pay | Source: Home / Self Care | Attending: Internal Medicine | Admitting: Internal Medicine

## 2010-09-18 ENCOUNTER — Encounter: Payer: Self-pay | Admitting: Internal Medicine

## 2010-09-24 ENCOUNTER — Encounter: Payer: Self-pay | Admitting: Internal Medicine

## 2010-09-30 LAB — CONVERTED CEMR LAB: Hgb A1c MFr Bld: 6 % (ref 4.6–6.5)

## 2010-10-01 ENCOUNTER — Encounter: Payer: Self-pay | Admitting: Internal Medicine

## 2010-10-04 ENCOUNTER — Encounter (HOSPITAL_COMMUNITY): Payer: Medicare Other | Attending: Internal Medicine

## 2010-10-04 DIAGNOSIS — E663 Overweight: Secondary | ICD-10-CM | POA: Insufficient documentation

## 2010-10-04 DIAGNOSIS — Z7982 Long term (current) use of aspirin: Secondary | ICD-10-CM | POA: Insufficient documentation

## 2010-10-04 DIAGNOSIS — Z9861 Coronary angioplasty status: Secondary | ICD-10-CM | POA: Insufficient documentation

## 2010-10-04 DIAGNOSIS — E785 Hyperlipidemia, unspecified: Secondary | ICD-10-CM | POA: Insufficient documentation

## 2010-10-04 DIAGNOSIS — I209 Angina pectoris, unspecified: Secondary | ICD-10-CM | POA: Insufficient documentation

## 2010-10-04 DIAGNOSIS — Z5189 Encounter for other specified aftercare: Secondary | ICD-10-CM | POA: Insufficient documentation

## 2010-10-04 DIAGNOSIS — I2510 Atherosclerotic heart disease of native coronary artery without angina pectoris: Secondary | ICD-10-CM | POA: Insufficient documentation

## 2010-10-04 DIAGNOSIS — R0602 Shortness of breath: Secondary | ICD-10-CM | POA: Insufficient documentation

## 2010-10-04 DIAGNOSIS — I10 Essential (primary) hypertension: Secondary | ICD-10-CM | POA: Insufficient documentation

## 2010-10-04 DIAGNOSIS — E8881 Metabolic syndrome: Secondary | ICD-10-CM | POA: Insufficient documentation

## 2010-10-04 DIAGNOSIS — Z87891 Personal history of nicotine dependence: Secondary | ICD-10-CM | POA: Insufficient documentation

## 2010-10-04 NOTE — Consult Note (Signed)
[facility]: [facility]   Imported By: Freddy Jaksch 10/09/2007 09:45:48  _____________________________________________________________________  External Attachment:    Type:   Image     Comment:   External Document

## 2010-10-04 NOTE — Progress Notes (Signed)
Summary: Nuclear Pre-Procedure  Phone Note Outgoing Call   Call placed by: Milana Na, EMT-P,  Jan 08, 2010 3:56 PM Summary of Call: Left message with information on [employer] Information Sheet (see scanned document for details).      Nuclear Med Background Indications for Stress Test: Evaluation for Ischemia   History: Echo, Heart Catheterization, Myocardial Perfusion Study  History Comments: 11/07 MPS sig. ischemia inf-lat wall from base to apex EF 49% Heart Cath M-V CAD med Tx '08 ECHO EF 55-65% mild MR     Nuclear Pre-Procedure Cardiac Risk Factors: Carotid Disease, History of Smoking, Hypertension, Lipids, NIDDM Height (in): 68  Nuclear Med Study Referring MD:  D.Bensimhon

## 2010-10-04 NOTE — Progress Notes (Signed)
Summary: returning call   Phone Note Call from Patient Call back at Work Phone [phone]   Caller: Patient Reason for Call: Talk to Nurse Summary of Call: returning call about prescription Initial call taken by: Migdalia Dk,  November 01, 2009 11:53 AM  Follow-up for Phone Call        pt returned call concerning Niaspan -- he is actually taking 1000mg  1/2 once daily... sent prescription thru for 1 tab once daily as directed Follow-up by: Hardin Negus, RMA,  November 01, 2009 3:50 PM

## 2010-10-04 NOTE — Letter (Signed)
Summary: Generic Letter  [facility], Main Office  [address]   [place], [place] [address]   Phone: [phone]  Fax: [phone]        Jan 09, 2009 MRN: [id number]    KIERRE HINTZ [address] [place], [place]  [address]    Dear Mr. Bjorkman,  Your hemoglobin A1C was normal at 6.1    Sincerely,  Meredith Staggers, RN  This letter has been electronically signed by your physician.

## 2010-10-04 NOTE — Assessment & Plan Note (Signed)
Summary: BAD COUGH X2WKS/RH.....   Vital Signs:  Patient profile:   70 year old male Height:      68 inches Weight:      196 pounds O2 Sat:      97 % on Room air Temp:     98.4 degrees F oral Pulse rate:   61 / minute Resp:     16 per minute BP sitting:   150 / 70  (left arm)  Vitals Entered By: Jeremy Johann CMA (September 11, 2009 3:21 PM)  O2 Flow:  Room air CC: dry cough x8days Comments REVIEWED MED LIST, PATIENT AGREED DOSE AND INSTRUCTION CORRECT    Primary Care Provider:  Marga Melnick MD  CC:  dry cough x8days.  History of Present Illness: Onset 09/03/2009 as ST, laryngitis  followed by NP cough. Rx: Neti pot , NSAIDS  Allergies: 1)  ! Tylox  Review of Systems General:  Denies chills, fever, and sweats. ENT:  Complains of nasal congestion and sinus pressure; denies ear discharge and earache; No frontal headache, facial pain , or purulence . Resp:  Complains of wheezing; denies chest pain with inspiration, coughing up blood, shortness of breath, and sputum productive; No PMH of asthma.  Physical Exam  General:  well-nourished,in no acute distress; alert,appropriate and cooperative throughout examination Ears:  TMs dull; small amount wax on R Nose:  External nasal examination shows no deformity or inflammation. Nasal mucosa are  dry without lesions or exudates. Mouth:  Oral mucosa and oropharynx without lesions or exudates.  Hoarse Lungs:  Normal respiratory effort, chest expands symmetrically. Lungs are clear to auscultation, no crackles or wheezes. decreased BS; brassy cough Heart:  regular rhythm and bradycardia.   Cervical Nodes:  No lymphadenopathy noted Axillary Nodes:  No palpable lymphadenopathy   Impression & Recommendations:  Problem # 1:  BRONCHITIS-ACUTE (ICD-466.0)  His updated medication list for this problem includes:    Clarithromycin 500 Mg Xr24h-tab (Clarithromycin) .Marland Kitchen... 2 once daily with a meal(hold simvastatin until completed)  Benzonatate 200 Mg Caps (Benzonatate) .Marland Kitchen... 1 q 6-8 as needed cough  Complete Medication List: 1)  Metformin Hcl 500 Mg Tabs (Metformin hcl) .Marland Kitchen.. 1 tab once daily 2)  Plavix 75 Mg Tabs (Clopidogrel bisulfate) .Marland Kitchen.. 1 by mouth qd 3)  Coreg 12.5 Mg Tabs (Carvedilol) .Marland Kitchen.. 1 1/2 tabs two times a day 4)  Simvastatin 40 Mg Tabs (Simvastatin) .Marland Kitchen.. 1 by mouth qpm 5)  Lisinopril 20 Mg Tabs (Lisinopril) .Marland Kitchen.. 1 by mouth qd 6)  Adult Aspirin Ec Low Strength 81 Mg Tbec (Aspirin) .Marland Kitchen.. 1 by mouth qd 7)  Fish Oil  .... Once daily 8)  Saw Palmetto  .... Two times a day 9)  Niaspan 500 Mg Cr-tabs (Niacin (antihyperlipidemic)) .Marland Kitchen.. 1 tab at bedtime 10)  Clarithromycin 500 Mg Xr24h-tab (Clarithromycin) .... 2 once daily with a meal(hold simvastatin until completed) 11)  Benzonatate 200 Mg Caps (Benzonatate) .Marland Kitchen.. 1 q 6-8 as needed cough  Patient Instructions: 1)  Drink as much fluid as you can tolerate for the next few days. Veramyst 1 spray two times a day after Neti pot use. Stop Simvastatin until antibiotic completed. Prescriptions: BENZONATATE 200 MG CAPS (BENZONATATE) 1 q 6-8 as needed cough  #21 x 0   Entered and Authorized by:   Marga Melnick MD   Signed by:   Marga Melnick MD on 09/11/2009   Method used:   Faxed to ...       [id number]* (retail)  [address] [place], [place]  [address]       Ph: [phone]       Fax: [phone]   RxID:   [id number] CLARITHROMYCIN 500 MG XR24H-TAB (CLARITHROMYCIN) 2 once daily with a meal(HOLD Simvastatin until completed)  #20 x 0   Entered and Authorized by:   Marga Melnick MD   Signed by:   Marga Melnick MD on 09/11/2009   Method used:   Faxed to ...       [facility] [address] (retail)       [address]       [place], [place]  [address]       Ph: [phone]       Fax: [phone]   RxID:   [id number]

## 2010-10-04 NOTE — Assessment & Plan Note (Signed)
Summary: 4 mo f/u      Allergies Added:   Visit Type:  Follow-up Primary Provider:  Marga Melnick MD   History of Present Illness: Jacob Curry is a very pleasant 70 year old male with a history of multiple multivessel coronary artery disease being treated medically. He also has a history of hypertension, hyperlipidemia and diabetes.  He returns today for routine followup. Overall he is doing very well he continues with a maintenance program at cardiac rehabilitation 3 days a week. He has not had any exertional chest pain or dyspnea.  On check in at cardiac rehabilitation his systolic blood pressures usually in the 110-120 range. When he forgot to take meds last week was 150. He is not having orthopnea PND or lower extremity edema. No problem with meds.No further PVCs.   Chol 101 TG 80 HDL 42 LDL 43.  Current Medications (verified): 1)  Metformin Hcl 500 Mg  Tabs (Metformin Hcl) .Marland Kitchen.. 1 Tab Once Daily 2)  Plavix 75 Mg  Tabs (Clopidogrel Bisulfate) .Marland Kitchen.. 1 By Mouth Qd 3)  Coreg 12.5 Mg  Tabs (Carvedilol) .Marland Kitchen.. 1 1/2 Tabs Two Times A Day 4)  Simvastatin 40 Mg  Tabs (Simvastatin) .Marland Kitchen.. 1 By Mouth Qpm 5)  Lisinopril 20 Mg  Tabs (Lisinopril) .Marland Kitchen.. 1 By Mouth Qd 6)  Adult Aspirin Ec Low Strength 81 Mg  Tbec (Aspirin) .Marland Kitchen.. 1 By Mouth Qd 7)  Fish Oil .... Once Daily 8)  Saw Palmetto .... Two Times A Day 9)  Niaspan 500 Mg Cr-Tabs (Niacin (Antihyperlipidemic)) .Marland Kitchen.. 1 Tab At Bedtime  Allergies (verified): 1)  ! Tylox  Past History:  Past Medical History: Last updated: 10/20/2008 1) CAD      --cath11/07: LM 40% LAD: 40%, D1 90% [id number] in midsection filling from L to L collaterals                          RCA: codominant with 80-90& mid; RV branch 90% EF 50-55%. -> Medical rx 2) HTN 3) HL 4) Diabetes 5) h/o UTI and prostatitis 6) Erectile dysfunction 7) Carotid stenosis, very mild      --0-39% bilaterally 9/08 8) Obesity  Review of Systems       As per HPI and past medical history;  otherwise all systems negative.   Vital Signs:  Patient profile:   70 year old male Height:      68 inches Weight:      190 pounds BMI:     28.99 Pulse rate:   66 / minute BP sitting:   128 / 74  (left arm)  Vitals Entered By: Laurance Flatten CMA (May 15, 2009 2:13 PM)  Physical Exam  General:  Well appearing. Ambulates clinic without any respiratory difficulty. HEENT: normal Neck: supple. no JVD. Carotids 2+ bilaterally without bruits. No lymphadenopathy or thryomegaly appreciated. Cor: PMI nondisplaced. Regulare rate and rhythm. No murmurs, rubs or gallops Lungs: clear to auscultation Abdomen: soft, nontender, nondistended. No hepatosplenomegaly. No bruits or masses. Good bowel sounds. Extremities: no cyanosis, clubbing, edema. No rash Neuro: alert and oriented x3, cranial nerves grossly intact. moves all 4 extremities without difficulty. affect pleasant    Impression & Recommendations:  Problem # 1:  CAD, NATIVE VESSEL (ICD-414.01) Curry. No evidence of ischemia. Continue current regimen. Continue cardiac rehab maintenance program. May be worth considering f/u nuke study at some point.  Problem # 2:  HYPERTENSION, BENIGN (ICD-401.1) Well controlled. Continue current regimen.   Problem # 3:  HYPERLIPIDEMIA-MIXED (ICD-272.4) Lipids look great. LDL well under 70. Continue current regimen.  Other Orders: EKG w/ Interpretation (93000)  Patient Instructions: 1)  Labs in 4 months--bmet, liver, lipid, hgb a1c 2)  Follow up in 4 months

## 2010-10-04 NOTE — Medication Information (Signed)
Summary: [facility]  [facility]   Imported By: Sherian Rein 09/24/2010 10:12:40  _____________________________________________________________________  External Attachment:    Type:   Image     Comment:   External Document

## 2010-10-04 NOTE — Progress Notes (Signed)
Summary: CHECK EMAIL  Phone Note Call from Patient Call back at Work Phone [phone]   Caller: Patient Call For: Marga Melnick MD Reason for Call: Talk to Doctor Summary of Call: PATIENT CALLING, STATES HE SENT DR. HOPPER AN EMAIL.  DR. HOPPER, PATIENT REQUESTS YOU PLEASE CHECK THE EMAIL HE SENT YOU. Initial call taken by: Magdalen Spatz [facility],  August 21, 2009 8:53 AM  Follow-up for Phone Call        done Sat 12/18 Follow-up by: Marga Melnick MD,  August 21, 2009 9:09 AM  Additional Follow-up for Phone Call Additional follow up Details #1::        this is different request ; can we work in his wife today?  Additional Follow-up by: Marga Melnick MD,  August 21, 2009 9:20 AM    Additional Follow-up for Phone Call Additional follow up Details #2::    I called Mr.Hemp and informed him Dr.Hopper's schedule is completely full and we will see if another Dr. can see her and he said they prefer to see Dr.Hopper. Patient to see Dr.Hopper tomorrow at 12pm. Patient's husband not sure if patient will have a way because she doesnt drive but he requested that I go ahead and save the appointment date and time for her and if anything changes he will call back to cancle./[facility]  August 21, 2009 9:36 AM

## 2010-10-04 NOTE — Progress Notes (Signed)
   Walk in Patient Form Recieved " Pt wants to schedule labs before Oct 6th appt" sent to [name]  April 19, 2010 1:36 PM

## 2010-10-04 NOTE — Assessment & Plan Note (Signed)
Summary: Cardiology Office Visit   History of Present Illness: Jacob Curry is a very pleasant 70 year old male with a history of multiple multivessel coronary artery disease being treated medically. He also has a history of hypertension, hyperlipidemia and diabetes.  He returns today for routine followup. Overall he is doing very well he continues with a maintenance program at cardiac rehabilitation 3 days a week. He has not had any exertional chest pain or dyspnea. He does note that his energy levels but a little bit low over the past few months. On check in at cardiac rehabilitation his blood pressures usually in the 105-110 range have her checked out he can go up as high as 135 or 140. he has not had problems with bleeding. He is not having orthopnea PND or Lotrimin edema     Prior Medications Reviewed Using: List Brought by Patient  Updated Prior Medication List: METFORMIN HCL 500 MG  TABS (METFORMIN HCL) 1 tab once daily PLAVIX 75 MG  TABS (CLOPIDOGREL BISULFATE) 1 by mouth qd COREG 12.5 MG  TABS (CARVEDILOL) 1 1/2 tabs two times a day SIMVASTATIN 40 MG  TABS (SIMVASTATIN) 1 by mouth qpm LISINOPRIL 20 MG  TABS (LISINOPRIL) 1 by mouth qd ADULT ASPIRIN EC LOW STRENGTH 81 MG  TBEC (ASPIRIN) 1 by mouth qd * FISH OIL once daily * SAW PALMETTO two times a day NIASPAN 500 MG CR-TABS (NIACIN (ANTIHYPERLIPIDEMIC)) 1 tab at bedtime  Current Allergies (reviewed today): ! TYLOX Past Medical History:       1) CAD            --cath11/07: LM 40% LAD: 40%, D1 90% [id number] in midsection filling from L to L collaterals                                RCA: codominant with 80-90& mid; RV branch 90% EF 50-55%. -> Medical rx       2) HTN       3) HL       4) Diabetes       5) h/o UTI and prostatitis       6) Erectile dysfunction       7) Carotid stenosis, very mild            --0-39% bilaterally 9/08       8) Obesity   Review of Systems       As per HPI and past medical history; otherwise all  systems negative.    Vital Signs:  Patient Profile:   70 Years Old Male Weight:      188 pounds Pulse rate:   57 / minute BP sitting:   156 / 78  (right arm)  Vitals Entered By: Meredith Staggers, RN (October 20, 2008 5:11 PM)                Physical Exam  General:     Well appearing. Ambulates clinic without any respiratory difficulty. HEENT: normal Neck: supple. no JVD. Carotids 2+ bilaterally without bruits. No lymphadenopathy or thryomegaly appreciated. Cor: PMI nondisplaced. Regulare rate and rhythm. No murmurs, rubs or gallops Lungs: clear to auscultation Abdomen: soft, nontender, nondistended. No hepatosplenomegaly. No bruits or masses. Good bowel sounds. Extremities: no cyanosis, clubbing, edema. No rash Neuro: alert and oriented x3, cranial nerves grossly intact. moves all 4 extremities without difficulty. affect pleasant     Impression & Recommendations:  Problem # 1:  CAD, NATIVE VESSEL (  ICD-414.01) He continues to do well. There is no evidence of ongoing ischemia. Continue medical therapy.  Problem # 2:  HYPERLIPIDEMIA-MIXED (ICD-272.4) Most recent lipid panel looks great total cholesterol is 104 triglycerides 111 HDL 39 LDL 42 continue current regimen.  Problem # 3:  HYPERTENSION, BENIGN (ICD-401.1) Blood pressure appears reasonably well-controlled at cardiac rehabilitation. However every time he comes here the late afternoon his blood pressure seems to be elevated. I've asked him to check his home blood pressures at night and report this to me. I suspect we may need to increase his antihypertensive regimen. I want to keep his systolic blood pressures under 130.  Medications Added to Medication List This Visit: 1)  Coreg 12.5 Mg Tabs (Carvedilol) .Marland Kitchen.. 1 1/2 tabs two times a day 2)  Saw Palmetto  .... Two times a day 3)  Niaspan 500 Mg Cr-tabs (Niacin (antihyperlipidemic)) .Marland Kitchen.. 1 tab at bedtime   Patient Instructions: 1)  Overall doing well. Will check  nighttime blood pressures. Return to clinic in 3 months.

## 2010-10-04 NOTE — Assessment & Plan Note (Signed)
Summary: glucose monitor instruction.cbs   Vital Signs:  Patient Profile:   70 Years Old Male Weight:      181.13 pounds Pulse rate:   64 / minute Pulse rhythm:   regular BP sitting:   130 / 70  (left arm) Cuff size:   large  Pt. in pain?   no  Vitals Entered By: Wendall Stade (February 19, 2007 1:23 PM)                Chief Complaint:  teaching glucometer and Type 2 diabetes mellitus follow-up.  History of Present Illness: A1c staying @ 6.1%; on metformin  Type 2 Diabetes Mellitus Follow-Up      This is a 70 year old man who presents for Type 2 diabetes mellitus follow-up.  Wt loss 15# with diet; no DM retinopathy.  The patient reports weight loss, but denies polyuria, polydipsia, blurred vision, self managed hypoglycemia, hypoglycemia requiring help, weight gain, and numbness of extremities.  The patient denies the following symptoms: neuropathic pain, chest pain, vomiting, orthostatic symptoms, poor wound healing, intermittent claudication, vision loss, and foot ulcer.  Since the last visit the patient reports good dietary compliance and exercising regularly.  Since the last visit, the patient reports having had eye care by an ophthalmologist.    Current Allergies (reviewed today): ! TYLOX Updated/Current Medications (including changes made in today's visit):  METFORMIN HCL 500 MG  TABS (METFORMIN HCL) 1 tab once daily PLAVIX 75 MG  TABS (CLOPIDOGREL BISULFATE) 1 by mouth qd COREG 12.5 MG  TABS (CARVEDILOL) 1 1/2 tabs daily SIMVASTATIN 40 MG  TABS (SIMVASTATIN) 1 by mouth qpm LISINOPRIL 20 MG  TABS (LISINOPRIL) 1 by mouth qd ADULT ASPIRIN EC LOW STRENGTH 81 MG  TBEC (ASPIRIN) 1 by mouth qd       Physical Exam  General:     Well-developed,well-nourished,in no acute distress; alert,appropriate and cooperative throughout examination Heart:     Normal rate and regular rhythm. S1 and S2 normal without gallop, murmur, click, rub or other extra sounds.No carotid  bruits Pulses:     decreased DPP Extremities:     No clubbing, cyanosis, edema, or deformity noted with normal full range of motion of all joints.   Skin:     Intact without suspicious lesions or rashes    Impression & Recommendations:  Problem # 1:  C A D (ICD-414.00)  His updated medication list for this problem includes:    Plavix 75 Mg Tabs (Clopidogrel bisulfate) .Marland Kitchen... 1 by mouth qd    Coreg 12.5 Mg Tabs (Carvedilol) .Marland Kitchen... 1 1/2 tabs daily    Lisinopril 20 Mg Tabs (Lisinopril) .Marland Kitchen... 1 by mouth qd    Adult Aspirin Ec Low Strength 81 Mg Tbec (Aspirin) .Marland Kitchen... 1 by mouth qd   Problem # 2:  METABOLIC SYNDROME X (ICD-277.7)  Medications Added to Medication List This Visit: 1)  Plavix 75 Mg Tabs (Clopidogrel bisulfate) .Marland Kitchen.. 1 by mouth qd 2)  Coreg 12.5 Mg Tabs (Carvedilol) .Marland Kitchen.. 1 1/2 tabs daily 3)  Simvastatin 40 Mg Tabs (Simvastatin) .Marland Kitchen.. 1 by mouth qpm 4)  Lisinopril 20 Mg Tabs (Lisinopril) .Marland Kitchen.. 1 by mouth qd 5)  Adult Aspirin Ec Low Strength 81 Mg Tbec (Aspirin) .Marland Kitchen.. 1 by mouth qd   Patient Instructions: 1)  Establish baseline glucose control & dietary contribution. Goals as discussed. A1c in 6 mos.

## 2010-10-04 NOTE — Letter (Signed)
Summary: Cardiac Catheterization Instructions- [facility]  [employer], Main Office  [address]   [place], [place] [address]   Phone: [phone]  Fax: [phone]     01/11/2010 MRN: [id number]  Jacob Curry [address] [place], [place]  [address]  Dear Mr. Zellman,   You are scheduled for a Cardiac Catheterization on Friday 01/19/10 with Dr.Eleen Litz  Please arrive to the 1st floor of the Heart and Vascular Center at [facility] at 7:30 am  on the day of your procedure. Please do not arrive before 6:30 a.m. Call the Heart and Vascular Center at [phone] if you are unable to make your appointmnet. The Code to get into the parking garage under the building is 0010. Take the elevators to the 1st floor. You must have someone to drive you home. Someone must be with you for the first 24 hours after you arrive home. Please wear clothes that are easy to get on and off and wear slip-on shoes. Do not eat or drink after midnight except water with your medications that morning. Bring all your medications and current insurance cards with you.  _XX__ DO NOT take these medications before your procedure:  Do not take Metformin am of test or for 2 days after  __xx_ Make sure you take your aspirin.     The usual length of stay after your procedure is 2 to 3 hours. This can vary.  If you have any questions, please call the office at the number listed above.   Meredith Staggers, RN

## 2010-10-04 NOTE — Letter (Signed)
Summary: Results Follow up Letter   at [facility]  [address] [place], [place] [address]   Phone: [phone]  Fax: [phone]    06/15/2007 MRN: [id number]  Jacob Curry [address] [place], [place]  [address]  Dear Mr. Pohle,  The following are the results of your recent test(s):  Test         Result    Pap Smear:        Normal _____  Not Normal _____ Comments: ______________________________________________________ Cholesterol: LDL(Bad cholesterol):         Your goal is less than:         HDL (Good cholesterol):       Your goal is more than: Comments:  ______________________________________________________ Mammogram:        Normal _____  Not Normal _____ Comments:  ___________________________________________________________________ Hemoccult:        Normal _____  Not normal _______ Comments:    _____________________________________________________________________ Other Tests:  Please see attached results and comments   We routinely do not discuss normal results over the telephone.  If you desire a copy of the results, or you have any questions about this information we can discuss them at your next office visit.   Sincerely,

## 2010-10-04 NOTE — Miscellaneous (Signed)
Summary: Orders Update  Clinical Lists Changes  Problems: Added new problem of UTI (ICD-599.0) Orders: Added new Test order of TLB-Udip w/ Micro (81001-URINE) - Signed Added new Test order of T-Culture, Urine ([id number]) - Signed

## 2010-10-04 NOTE — Assessment & Plan Note (Signed)
Summary: shingles inj /cbs  Nurse Visit    Prior Medications: METFORMIN HCL 500 MG  TABS (METFORMIN HCL) 1 tab once daily PLAVIX 75 MG  TABS (CLOPIDOGREL BISULFATE) 1 by mouth qd COREG 12.5 MG  TABS (CARVEDILOL) 1 1/2 tabs daily SIMVASTATIN 40 MG  TABS (SIMVASTATIN) 1 by mouth qpm LISINOPRIL 20 MG  TABS (LISINOPRIL) 1 by mouth qd ADULT ASPIRIN EC LOW STRENGTH 81 MG  TBEC (ASPIRIN) 1 by mouth qd FISH OIL () once daily SAW PALMETTO () once daily LEVITRA 20 MG  TABS (VARDENAFIL HCL) 1/2 -1 once daily prn LEVAQUIN 500 MG  TABS (LEVOFLOXACIN) 1 qd Current Allergies: ! TYLOX   Zostavax # 1    Vaccine Type: Zostavax    Site: right deltoid    Mfr: Merck    Dose: 0.29mL    Route: IM    Given by: Floydene Flock CMA    Exp. Date: 07/25/2009    Lot #: [id number]   Orders Added: 1)  Zoster (Shingles) Vaccine Live [90736] 2)  Admin 1st Vaccine [email or web address]    ]

## 2010-10-04 NOTE — Progress Notes (Signed)
Summary: question regarding cardiac rehab   Phone Note Call from Patient Call back at Home Phone [phone]   Caller: Spouse Reason for Call: Talk to Nurse, Talk to Doctor Summary of Call: pt is to have a cath or ablation on Friday 5/20 spuse wants to know if he needs to still do cardiac rehab on Thursday prior to cath or ablation Initial call taken by: Omer Jack,  Jan 16, 2010 8:46 AM  Follow-up for Phone Call        I spoke with patient's wife about cardiac rehab.  I told her it was okay to do cardiac rehab on Thursday before his cath on Friday 5/20.  He is not having any chest pain or discomfort with rehab.  She also states they have not received their instructions about the cath.  I reviewed the letter with them and will mail it out today. Lisabeth Devoid RN

## 2010-10-04 NOTE — Letter (Signed)
Summary: [facility]  [facility]   Imported By: Lanelle Bal 02/27/2009 11:06:33  _____________________________________________________________________  External Attachment:    Type:   Image     Comment:   External Document

## 2010-10-04 NOTE — Assessment & Plan Note (Signed)
Summary: eph/jml   Primary Provider:  Marga Melnick MD  CC:  ROV after cardiac cath; no complaints.  History of Present Illness: Jacob Curry is a very pleasant 70 year old male with a history of multiple multivessel coronary artery disease being treated medically. He also has a history of hypertension, hyperlipidemia and diabetes.  Recently had a f/u Myoview with large infero-lateral reversible defect. F/u cath:  1. Coronary artery disease with chronic total occlusion of a large     left circumflex, which is collateralized by the left anterior     descending coronary artery.  This is unchanged from previous.     Otherwise catheterization notable for branch vessel coronary artery     disease. 2. Left ventricular ejection fraction was 50-55% with lateral     hypokinesis.  We discussed possibility of opening his CTO.   He returns today very anxious about what tod do. He says he feels great and does not have any CP or dyspnea. Very anxious to get back to cardiac rehab. Max HR in cardiac rehab < 105.    Current Medications (verified): 1)  Metformin Hcl 500 Mg  Tabs (Metformin Hcl) .Marland Kitchen.. 1 Two Times A Day With 2 Largest Meals 2)  Plavix 75 Mg  Tabs (Clopidogrel Bisulfate) .Marland Kitchen.. 1 By Mouth Qd 3)  Coreg 12.5 Mg  Tabs (Carvedilol) .Marland Kitchen.. 1 Tabs Two Times A Day 4)  Simvastatin 40 Mg  Tabs (Simvastatin) .Marland Kitchen.. 1 By Mouth Qpm 5)  Lisinopril 20 Mg  Tabs (Lisinopril) .... 2 By Mouth Once Daily 6)  Adult Aspirin Ec Low Strength 81 Mg  Tbec (Aspirin) .Marland Kitchen.. 1 By Mouth Qd 7)  Fish Oil .... Once Daily 8)  Saw Palmetto .... Two Times A Day 9)  Niaspan 500 Mg Cr-Tabs (Niacin (Antihyperlipidemic)) .Marland Kitchen.. 1 By Mouth Daily 10)  Nitrostat 0.4 Mg Subl (Nitroglycerin) .Marland Kitchen.. 1 Tablet Under Tongue At Onset of Chest Pain; You May Repeat Every 5 Minutes For Up To 3 Doses.  Allergies: 1)  ! Tylox  Past History:  Past Medical History: Last updated: 10/20/2008 1) CAD      --cath11/07: LM 40% LAD: 40%, D1 90% [id number]  in midsection filling from L to L collaterals                          RCA: codominant with 80-90& mid; RV branch 90% EF 50-55%. -> Medical rx 2) HTN 3) HL 4) Diabetes 5) h/o UTI and prostatitis 6) Erectile dysfunction 7) Carotid stenosis, very mild      --0-39% bilaterally 9/08 8) Obesity  Review of Systems       As per HPI and past medical history; otherwise all systems negative.   Vital Signs:  Patient profile:   70 year old male Height:      68 inches Weight:      191.75 pounds BMI:     29.26 Pulse rate:   57 / minute BP sitting:   150 / 68  (left arm) Cuff size:   regular  Vitals Entered By: Stanton Kidney, EMT-P (January 31, 2010 1:41 PM)  Physical Exam  General:  Gen: well appearing. no resp difficulty HEENT: normal Neck: supple. no JVD. Carotids 2+ bilat; no bruits. No lymphadenopathy or thryomegaly appreciated. Cor: PMI nondisplaced. Regular rate & rhythm. No rubs, gallops, murmur. Lungs: clear Abdomen: soft, nontender, nondistended. No hepatosplenomegaly. No bruits or masses. Good bowel sounds. Extremities: no cyanosis, clubbing, rash, edema . groin ok Neuro:  alert & orientedx3, cranial nerves grossly intact. moves all 4 extremities w/o difficulty. affect pleasant    Impression & Recommendations:  Problem # 1:  CAD, NATIVE VESSEL (ICD-414.01) We had a long talk about the options regarding continued medical therapy vs attempt at PCI of his CTO. Given that he is totally asx and has been Curry for 3.5 years would favor continued medical therapy. He is in agreement about this.  Problem # 2:  HYPERTENSION, BENIGN (ICD-401.1) Blood pressure mildly elevated. Increase Coreg back to 18.75 two times a day.   Problem # 3:  PREMATURE VENTRICULAR CONTRACTIONS, FREQUENT (ICD-427.69) Curry. benign. No further w/u at this point.   Patient Instructions: 1)  Increase Carvedilol to 1 & 1/2 tab two times a day  2)  Follow up in 4 months  Prevention & Chronic  Care Immunizations   Influenza vaccine: Not documented    Tetanus booster: Not documented    Pneumococcal vaccine: Pneumovax (Medicare)  (07/23/2007)    H. zoster vaccine: 03/22/2008: Zostavax  Colorectal Screening   Hemoccult: Not documented    Colonoscopy: Not documented  Other Screening   PSA: 1.70  (01/04/2009)   Smoking status: quit  (02/18/2007)  Lipids   Total Cholesterol: 100  (12/25/2009)   LDL: 29  (12/25/2009)   LDL Direct: 39.9  (07/22/2008)   HDL: 44.50  (12/25/2009)   Triglycerides: 132.0  (12/25/2009)    SGOT (AST): 22  (12/25/2009)   SGPT (ALT): 21  (12/25/2009)   Alkaline phosphatase: 63  (12/25/2009)   Total bilirubin: 0.8  (12/25/2009)  Hypertension   Last Blood Pressure: 150 / 68  (01/31/2010)   Serum creatinine: 0.8  (01/15/2010)   Serum potassium 4.0  (01/15/2010)  Self-Management Support :    Hypertension self-management support: Not documented    Lipid self-management support: Not documented

## 2010-10-04 NOTE — Letter (Signed)
Summary: E Mail from Patient Regarding Stents  E Mail from Patient Regarding Stents   Imported By: Lanelle Bal 02/02/2010 09:23:54  _____________________________________________________________________  External Attachment:    Type:   Image     Comment:   External Document

## 2010-10-04 NOTE — Miscellaneous (Signed)
Summary: [facility] Progress Note  [facility] Note   Imported By: Roderic Ovens 09/29/2009 10:05:06  _____________________________________________________________________  External Attachment:    Type:   Image     Comment:   External Document

## 2010-10-04 NOTE — Assessment & Plan Note (Signed)
Summary: 1 month return/mhh   Primary Provider/Referring Provider:  Marga Melnick MD  CC:  1 month follow up visit-[facility].  History of Present Illness:  History of Present Illness: July 19, 2010- 69 yoM here w/ wife on kind referal from Dr Gala Romney concerned about possible sleep apnea. He is noted to snore, occasionally pause, and to be sleepy in the daytime. Wife says snoting has gone on for years, worse when on his back. 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 Does not fall asleep at work, even when quiet and able to sit. Only decaf coffee. Bedtime 11Pm latency 2 minutes/ no meds. Wakes once during night then up at 530AM.  Staff reported he was hard to wake after anesthesia for foot surgery in the past.  Hx MI 2008, no thyroid or lung disease. ENT- only tonsils.  August 28, 2010- [facility] Nurse-CC: 1 month follow up visit-[facility] NPSG 07/20/10- AHI 20.2/hr, to try CPAP 6 Moderate obstructive sleep apnea. He was very uncomfortable with CPAP at the sleep  center because of claustrophobia. He says he has talked to people who don't like or wear their CPAP.     Preventive Screening-Counseling & Management  Alcohol-Tobacco     Smoking Status: quit     Packs/Day: 1.0     Year Started: age 70     Year Quit: 1992  Current Medications (verified): 1)  Metformin Hcl 500 Mg  Tabs (Metformin Hcl) .... Take 1 By Mouth Once Daily 2)  Plavix 75 Mg  Tabs (Clopidogrel Bisulfate) .Marland Kitchen.. 1 By Mouth Qd 3)  Coreg 12.5 Mg  Tabs (Carvedilol) .Marland Kitchen.. 1 & 1/2  Tabs Two Times A Day 4)  Simvastatin 40 Mg  Tabs (Simvastatin) .Marland Kitchen.. 1 By Mouth Qpm 5)  Lisinopril 20 Mg  Tabs (Lisinopril) .... 2 By Mouth Once Daily 6)  Adult Aspirin Ec Low Strength 81 Mg  Tbec (Aspirin) .Marland Kitchen.. 1 By Mouth Qd 7)  Fish Oil 1000 Mg Caps (Omega-3 Fatty Acids) .... Take 1 By Mouth Once Daily 8)  Saw Palmetto .... Two Times A Day 9)  Niaspan 500 Mg Cr-Tabs (Niacin (Antihyperlipidemic)) .Marland Kitchen.. 1 By Mouth Daily 10)  Nitrostat 0.4 Mg Subl (Nitroglycerin) .Marland Kitchen..  1 Tablet Under Tongue At Onset of Chest Pain; You May Repeat Every 5 Minutes For Up To 3 Doses.  Allergies (verified): 1)  ! Tylox 2)  ! 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 -> Medical rx 2) HTN 3) HL 4) Diabetes 5) h/o UTI and prostatitis 6) Erectile dysfunction 7) Carotid stenosis, very mild      --0-39% bilaterally 9/08 8) Obesity 9) Obstructive sleep apnea -NPSG  111/18/11-  AHI 20.2/ hr titrated to cpap 6  Review of Systems      See HPI  The patient denies shortness of breath with activity, shortness of breath at rest, productive cough, non-productive cough, coughing up blood, chest pain, irregular heartbeats, acid heartburn, indigestion, loss of appetite, weight change, abdominal pain, difficulty swallowing, sore throat, tooth/dental problems, headaches, nasal congestion/difficulty breathing through nose, sneezing, itching, anxiety, rash, change in color of mucus, and fever.    Vital Signs:  Patient profile:   70 year old male Height:      68 inches Weight:      195.38 pounds BMI:     29.81 O2 Sat:      98 % on Room air Pulse rate:   61 / minute BP sitting:   138 / 80  (left arm) Cuff size:   regular  Vitals  Entered By: Reynaldo Minium CMA (August 28, 2010 3:08 PM)  O2 Flow:  Room air CC: 1 month follow up visit-[facility]   Physical Exam  Additional Exam:  General: A/Ox3; pleasant and cooperative, NAD, overweight SKIN: no rash, lesions NODES: no lymphadenopathy HEENT: [place]/AT, EOM- WNL, Conjuctivae- clear, PERRLA, TM-WNL, Nose- clear, Throat- clear and wnl, Mallampati  II-III NECK: Supple w/ fair ROM, JVD- none, normal carotid impulses w/o bruits Thyroid- normal to palpation CHEST: Clear to P&A HEART: RRR, no m/g/r heard ABDOMEN:, abdominal obesisty ZDG:UYQI, nl pulses, no edema  NEURO: Grossly intact to observation       Impression & Recommendations:  Problem # 1:  OBSTRUCTIVE SLEEP APNEA (ICD-327.23)  He has moderate OSA and needs to lose weight. He doubts he could learn to tolerate CPAP. We discussed this vs  oral appliance referral and surgeries. he did have some central apneas and limb jerks, whic are not likely to be siginficant therapeutic targets.  Other Orders: Est. Patient Level III (34742)  Patient Instructions: 1)  Please schedule a follow-up appointment as needed. 2)  Consider asking orthodontist Dr Althea Grimmer for information about oral appliances for treating sleep apnea and snoring.  3)  If you decide you would like to meet with a Durable Medical Equipment company about CPAP options, we can help you with that.

## 2010-10-04 NOTE — Letter (Signed)
Summary: Custom - Lipid  [facility], Main Office  [address]   [place], [place] [address]   Phone: [phone]  Fax: [phone]     Jan 05, 2009 MRN: [id number]   Jacob Curry [address] [place], [place]  [address]   Dear [name],  We have reviewed your cholesterol results.  They are as follows:     Total Cholesterol:    107 (Desirable: less than 200)       HDL  Cholesterol:     47.40  (Desirable: greater than 40 for men and 50 for women)       LDL Cholesterol:       42  (Desirable: less than 100 for low risk and less than 70 for moderate to high risk)       Triglycerides:       88.0  (Desirable: less than 150)  Our recommendations include:  Looks [place]!!   Call our office at the number listed above if you have any questions.  Lowering your LDL cholesterol is important, but it is only one of a large number of "risk factors" that may indicate that you are at risk for heart disease, stroke or other complications of hardening of the arteries.  Other risk factors include:   A.  Cigarette Smoking* B.  High Blood Pressure* C.  Obesity* D.   Low HDL Cholesterol (see yours above)* E.   Diabetes Mellitus (higher risk if your is uncontrolled) F.  Family history of premature heart disease G.  Previous history of stroke or cardiovascular disease    *These are risk factors YOU HAVE CONTROL OVER.  For more information, visit .  There is now evidence that lowering the TOTAL CHOLESTEROL AND LDL CHOLESTEROL can reduce the risk of heart disease.  The American Heart Association recommends the following guidelines for the treatment of elevated cholesterol:  1.  If there is now current heart disease and less than two risk factors, TOTAL CHOLESTEROL should be less than 200 and LDL CHOLESTEROL should be less than 100. 2.  If there is current heart disease or two or more risk factors, TOTAL CHOLESTEROL should be less than 200 and LDL CHOLESTEROL should be  less than 70.  A diet low in cholesterol, saturated fat, and calories is the cornerstone of treatment for elevated cholesterol.  Cessation of smoking and exercise are also important in the management of elevated cholesterol and preventing vascular disease.  Studies have shown that 30 to 60 minutes of physical activity most days can help lower blood pressure, lower cholesterol, and keep your weight at a healthy level.  Drug therapy is used when cholesterol levels do not respond to therapeutic lifestyle changes (smoking cessation, diet, and exercise) and remains unacceptably high.  If medication is started, it is important to have you levels checked periodically to evaluate the need for further treatment options.  Thank you,    [employer] Team

## 2010-10-04 NOTE — Assessment & Plan Note (Signed)
Summary: possible uti /cbs   Vital Signs:  Patient Profile:   70 Years Old Male Weight:      185.13 pounds Temp:     97.4 degrees F oral Pulse rate:   60 / minute Pulse rhythm:   regular Resp:     16 per minute BP sitting:   120 / 60  (left arm) Cuff size:   large  Pt. in pain?   no  Vitals Entered By: Wendall Stade (July 23, 2007 2:16 PM)                  Chief Complaint:  possible uti and Dysuria.  History of Present Illness: painful urination, scant amount of urine for five days, denies back pain and no fever,chills, sweats  Dysuria      This is a 70 year old man who presents with Dysuria.  The patient reports burning with urination and urinary frequency, but denies urgency, hematuria, and penile discharge.  The patient denies the following associated symptoms: nausea, vomiting, fever, shaking chills, flank pain, abdominal pain, back pain, pelvic pain, and arthralgias.  Risk factors for urinary tract infection include diabetes.  The patient denies the following risk factors: prior antibiotics, immunosuppression, history of GU anomaly, history of pyelonephritis, history of STD, and analgesic abuse.    Current Allergies (reviewed today): ! TYLOX     Review of Systems  GU      Complains of decreased libido and erectile dysfunction.      Denies hematuria and incontinence.   Physical Exam  Genitalia:     Testes bilaterally descended without nodularity, tenderness. No penis lesions or urethral discharge.R hydrocele.   Prostate:     2+ enlarged. w/o nodules      Impression & Recommendations:  Problem # 1:  DYSURIA (ICD-788.1)  His updated medication list for this problem includes:    Amoxil 500 Mg Caps (Amoxicillin) .Marland Kitchen... 1 three times a day   Problem # 2:  ACUTE PROSTATITIS (ICD-601.0)  Problem # 3:  ERECTILE DYSFUNCTION (ICD-302.72)  His updated medication list for this problem includes:    Levitra 20 Mg Tabs (Vardenafil hcl) .Marland Kitchen... 1/2 -1 once  daily prn   Complete Medication List: 1)  Metformin Hcl 500 Mg Tabs (Metformin hcl) .Marland Kitchen.. 1 tab once daily 2)  Plavix 75 Mg Tabs (Clopidogrel bisulfate) .Marland Kitchen.. 1 by mouth qd 3)  Coreg 12.5 Mg Tabs (Carvedilol) .Marland Kitchen.. 1 1/2 tabs daily 4)  Simvastatin 40 Mg Tabs (Simvastatin) .Marland Kitchen.. 1 by mouth qpm 5)  Lisinopril 20 Mg Tabs (Lisinopril) .Marland Kitchen.. 1 by mouth qd 6)  Adult Aspirin Ec Low Strength 81 Mg Tbec (Aspirin) .Marland Kitchen.. 1 by mouth qd 7)  Fish Oil  .... Once daily 8)  Saw Palmetto  .... Once daily 9)  Amoxil 500 Mg Caps (Amoxicillin) .Marland Kitchen.. 1 three times a day 10)  Levitra 20 Mg Tabs (Vardenafil hcl) .... 1/2 -1 once daily prn  Other Orders: T-Culture, Urine ([id number]) UA Dipstick w/o Micro (85277) T-Culture, Urine ([id number]) Pneumoccal Vaccine Adm- Medicare (G0009) Admin 1st Vaccine (43154)   Patient Instructions: 1)  Complete stool cards    Prescriptions: LEVITRA 20 MG  TABS (VARDENAFIL HCL) 1/2 -1 once daily prn  #6 x 5   Entered and Authorized by:   Marga Melnick MD   Signed by:   Marga Melnick MD on 07/23/2007   Method used:   Print then Give to Patient   RxID:   [id number] AMOXIL 500 MG  CAPS (AMOXICILLIN)  1 three times a day  #30 x 2   Entered and Authorized by:   Marga Melnick MD   Signed by:   Marga Melnick MD on 07/23/2007   Method used:   Print then Give to Patient   RxID:   [id number]  ] Laboratory Results   Urine Tests  Date/Time Recieved: July 23, 2007 2:21 PM   Routine Urinalysis   Color: yellow Appearance: Hazy Glucose: negative   (Normal Range: Negative) Bilirubin: negative   (Normal Range: Negative) Ketone: negative   (Normal Range: Negative) Spec. Gravity: 1.020   (Normal Range: 1.003-1.035) Blood: small   (Normal Range: Negative) pH: 5.0   (Normal Range: 5.0-8.0) Protein: negative   (Normal Range: Negative) Urobilinogen: negative   (Normal Range: 0-1) Nitrite: negative   (Normal Range: Negative) Leukocyte Esterace: small   (Normal  Range: Negative)    Comments: sent for culture      Pneumovax Vaccine    Vaccine Type: Pneumovax (Medicare)    Site: left deltoid    Mfr: Merck    Dose: 0.5 ml    Route: IM    Given by: Wendall Stade    Exp. Date: 01/22/2009    Lot #: [id number]

## 2010-10-04 NOTE — Assessment & Plan Note (Signed)
Summary: Cardiology Nuclear Study  Nuclear Med Background Indications for Stress Test: Evaluation for Ischemia   History: Echo, Heart Catheterization, Myocardial Perfusion Study  History Comments: 11/07 MPS sig. ischemia inf-lat wall from base to apex EF 49% Heart Cath M-V CAD med Tx '08 ECHO EF 55-65% mild MR  Symptoms: DOE, Palpitations, SOB    Nuclear Pre-Procedure Cardiac Risk Factors: Carotid Disease, History of Smoking, Hypertension, Lipids, NIDDM Caffeine/Decaff Intake: none NPO After: 7:00 AM Lungs: clear IV 0.9% NS with Angio Cath: 18g     IV Site: (R) AC IV Started by: Stanton Kidney EMT-P Chest Size (in) 42     Height (in): 68 Weight (lb): 189 BMI: 28.84 Tech Comments: Carvedilol taken last night @ 7:30 pm, per Patient.  Nuclear Med Study 1 or 2 day study:  1 day     Stress Test Type:  Stress Reading MD:  Olga Millers, MD     Referring MD:  D.Bensimhon  Resting Radionuclide:  Technetium 58m Tetrofosmin     Resting Radionuclide Dose:  10.9 mCi  Stress Radionuclide:  Technetium 58m Tetrofosmin     Stress Radionuclide Dose:  33 mCi   Stress Protocol Exercise Time (min):  8:30 min     Max HR:  131 bpm     Predicted Max HR:  151 bpm  Max Systolic BP: 197 mm Hg     Percent Max HR:  86.75 %     METS: 10.10 Rate Pressure Product:  [address]    Stress Test Technologist:  Milana Na EMT-P     Nuclear Technologist:  Domenic Polite CNMT  Rest Procedure  Myocardial perfusion imaging was performed at rest 45 minutes following the intravenous administration of Myoview Technetium 23m Tetrofosmin.  Stress Procedure  The patient exercised for 8:30. The patient stopped due to fatigue, sob, and chest discomfort.  There were no significant ST-T wave changes and occ multifocal pvcs/rare pacs.  Myoview was injected at peak exercise and myocardial perfusion imaging was performed after a brief delay.  QPS Raw Data Images:  Acuisition technically good; normal left ventricular  size. Stress Images:  There is decreased uptake in the inferolateral wall. Rest Images:  There is decreased uptake in the inferolateral wall, less prominent compared to the stress images. Subtraction (SDS):  These findings are consistent with severe ischemia in the inferolateral wall. Transient Ischemic Dilatation:  1.16  (Normal <1.22)  Lung/Heart Ratio:  .32  (Normal <0.45)  Quantitative Gated Spect Images QGS EDV:  114 ml QGS ESV:  51 ml QGS EF:  55 % QGS cine images:  Mild hypokinesis of the inferolateral wall.   Overall Impression  Exercise Capacity: Fair exercise capacity. BP Response: Normal blood pressure response. Clinical Symptoms: There is chest pain ECG Impression: Significant ST abnormalities consistent with ischemia. Overall Impression: Abnormal stress nuclear study with severe ischemia in the inferolateral wall; probable transient ischemic dilatation; this is felt to be a high risk study.  Appended Document: Cardiology Nuclear Study per Dr Gala Romney pt needs cath, have called pt and left mess to call back  Appended Document: Cardiology Nuclear Study pt aware, see phone mess 5/12  Appended Document: Cardiology Nuclear Study agree. planning cath.

## 2010-10-04 NOTE — Assessment & Plan Note (Signed)
Summary: REVIEW LAB/CBS   Vital Signs:  Patient profile:   70 year old male Weight:      189.9 pounds BMI:     28.98 Pulse rate:   56 / minute Resp:     14 per minute BP sitting:   126 / 70  (left arm) Cuff size:   large  Vitals Entered By: Shonna Chock CMA (July 23, 2010 10:28 AM) CC: Review labs (copy given) , Type 2 diabetes mellitus follow-up   Primary Care Hydia Copelin:  Marga Melnick MD  CC:  Review labs (copy given)  and Type 2 diabetes mellitus follow-up.  History of Present Illness: Type 2 Diabetes Mellitus Follow-Up      This is a 70 year old man who presents for Type 2 diabetes mellitus follow-up.  The patient denies polyuria, polydipsia, blurred vision, self managed hypoglycemia, weight loss, weight gain, and numbness of extremities.  The patient denies the following symptoms: neuropathic pain, chest pain, vomiting, orthostatic symptoms, poor wound healing, intermittent claudication, vision loss, and foot ulcer.  Since the last visit the patient reports poor- fair  dietary compliance, exercising regularly as Cardiac Rehab 3x/ week for 75 min, and not monitoring blood glucose.  Since the last visit, the patient reports having had no eye care.   A1c 6.2% ( average sugar 132; risk 24%).  Allergies: 1)  ! Tylox 2)  ! Acetaminophen  Physical Exam  General:  well-nourished;alert,appropriate and cooperative throughout examination Lungs:  Normal respiratory effort, chest expands symmetrically. Lungs are clear to auscultation, no crackles or wheezes. Heart:  regular rhythm, no murmur, no gallop, no rub, no JVD, and bradycardia.  S4 with slurring Pulses:  R and L carotid,radial  and posterior tibial pulses are full and equal bilaterally. Decreased DPP w/o ischemia Extremities:  No clubbing, cyanosis, edema. Good nail health Neurologic:  alert & oriented X3 and sensation intact to light touch over feet.   Skin:  Intact without suspicious lesions or rashes Psych:  memory intact  for recent and remote, normally interactive, and good eye contact.     Impression & Recommendations:  Problem # 1:  DIABETES MELLITUS, CONTROLLED (ICD-250.00) Assessment Unchanged A1c 6.2% His updated medication list for this problem includes:    Metformin Hcl 500 Mg Tabs (Metformin hcl) .Marland Kitchen... Take 1 by mouth once daily    Lisinopril 20 Mg Tabs (Lisinopril) .Marland Kitchen... 2 by mouth once daily    Adult Aspirin Ec Low Strength 81 Mg Tbec (Aspirin) .Marland Kitchen... 1 by mouth qd  Complete Medication List: 1)  Metformin Hcl 500 Mg Tabs (Metformin hcl) .... Take 1 by mouth once daily 2)  Plavix 75 Mg Tabs (Clopidogrel bisulfate) .Marland Kitchen.. 1 by mouth qd 3)  Coreg 12.5 Mg Tabs (Carvedilol) .Marland Kitchen.. 1 & 1/2  tabs two times a day 4)  Simvastatin 40 Mg Tabs (Simvastatin) .Marland Kitchen.. 1 by mouth qpm 5)  Lisinopril 20 Mg Tabs (Lisinopril) .... 2 by mouth once daily 6)  Adult Aspirin Ec Low Strength 81 Mg Tbec (Aspirin) .Marland Kitchen.. 1 by mouth qd 7)  Fish Oil 1000 Mg Caps (Omega-3 fatty acids) .... Take 1 by mouth once daily 8)  Saw Palmetto  .... Two times a day 9)  Niaspan 500 Mg Cr-tabs (Niacin (antihyperlipidemic)) .Marland Kitchen.. 1 by mouth daily 10)  Nitrostat 0.4 Mg Subl (Nitroglycerin) .Marland Kitchen.. 1 tablet under tongue at onset of chest pain; you may repeat every 5 minutes for up to 3 doses.  Patient Instructions: 1)  It is important that your Diabetic  A1c level is checked every  6 months. Consume as little High Fructose Corn Syrup sugar as possible. 2)  See your eye doctor yearly to check for diabetic eye damage. 3)  Check your feet each night for sore areas, calluses or signs of infection. Prescriptions: METFORMIN HCL 500 MG  TABS (METFORMIN HCL) Take 1 by mouth once daily  #90 x 1   Entered and Authorized by:   Marga Melnick MD   Signed by:   Marga Melnick MD on 07/23/2010   Method used:   Print then Give to Patient   RxID:   [id number]    Orders Added: 1)  Est. Patient Level III [46962]

## 2010-10-04 NOTE — Cardiovascular Report (Signed)
Summary: Outpatient Coinsurance Notice   Outpatient Coinsurance Notice   Imported By: Roderic Ovens 01/15/2010 12:29:40  _____________________________________________________________________  External Attachment:    Type:   Image     Comment:   External Document

## 2010-10-04 NOTE — Letter (Signed)
Summary: Results Follow up Letter  [place] [facility]  [address] [place], [place] [address]   Phone: [phone]  Fax: [phone]    05/29/2007 MRN: [id number]  Jacob Curry [address] [place], [place]  [address]  Dear Jacob Curry,  The following are the results of your recent test(s):  Test         Result    Pap Smear:        Normal _____  Not Normal _____ Comments: ______________________________________________________ Cholesterol: LDL(Bad cholesterol):         Your goal is less than:         HDL (Good cholesterol):       Your goal is more than: Comments:  ______________________________________________________ Mammogram:        Normal _____  Not Normal _____ Comments:  ___________________________________________________________________ Hemoccult:        Normal _____  Not normal _______ Comments:    _____________________________________________________________________ Other Tests:  Please see attached results and comments   We routinely do not discuss normal results over the telephone.  If you desire a copy of the results, or you have any questions about this information we can discuss them at your next office visit.   Sincerely,

## 2010-10-04 NOTE — Progress Notes (Signed)
Summary: CELL # [id number]  Phone Note Other Incoming   Call placed by: Daine Gip,  July 28, 2007 10:00 AM Summary of Call: CELL PHONE # [phone]  ANOTHER CONTACT #  INSERTED IN IDX ALSO...................................................................Marland KitchenDaine Gip  July 28, 2007 10:01 AM Initial call taken by: Daine Gip,  July 28, 2007 10:01 AM

## 2010-10-04 NOTE — Assessment & Plan Note (Signed)
Summary: f53m  Medications Added SIMVASTATIN 40 MG  TABS (SIMVASTATIN) 1 by mouth qpm --- on hold while on antibiotic LISINOPRIL 20 MG  TABS (LISINOPRIL) 2 by mouth once daily PREDNISONE 20 MG TABS (PREDNISONE) once daily VERAMYST 27.5 MCG/SPRAY SUSP (FLUTICASONE FUROATE) once daily      Allergies Added:   Primary Provider:  Marga Melnick MD  CC:  f/u.  History of Present Illness: Jacob Curry is a very pleasant 70 year old male with a history of multiple multivessel coronary artery disease being treated medically. He also has a history of hypertension, hyperlipidemia and diabetes.  He returns today for routine followup. Overall he is doing very well he continues with a maintenance program at cardiac rehabilitation 3 days a week. Recently had bad URI and started on abx and prednisone.   He has not had any exertional chest pain or dyspnea.  On check in at cardiac rehabilitation his systolic blood pressures usually in the 110-130 range.  He is not having orthopnea PND or lower extremity edema. No problem with meds.  TC 113 TG 125 HDL 45 LDL 43 HgbA1c 6.2    Current Medications (verified): 1)  Metformin Hcl 500 Mg  Tabs (Metformin Hcl) .Marland Kitchen.. 1 Tab Once Daily 2)  Plavix 75 Mg  Tabs (Clopidogrel Bisulfate) .Marland Kitchen.. 1 By Mouth Qd 3)  Coreg 12.5 Mg  Tabs (Carvedilol) .Marland Kitchen.. 1 1/2 Tabs Two Times A Day 4)  Simvastatin 40 Mg  Tabs (Simvastatin) .Marland Kitchen.. 1 By Mouth Qpm --- On Hold While On Antibiotic 5)  Lisinopril 20 Mg  Tabs (Lisinopril) .... 2 By Mouth Once Daily 6)  Adult Aspirin Ec Low Strength 81 Mg  Tbec (Aspirin) .Marland Kitchen.. 1 By Mouth Qd 7)  Fish Oil .... Once Daily 8)  Saw Palmetto .... Two Times A Day 9)  Niaspan 500 Mg Cr-Tabs (Niacin (Antihyperlipidemic)) .Marland Kitchen.. 1 Tab At Bedtime 10)  Clarithromycin 500 Mg Xr24h-Tab (Clarithromycin) .... 2 Once Daily With A Meal(Hold Simvastatin Until Completed) 11)  Prednisone 20 Mg Tabs (Prednisone) .... Once Daily 12)  Veramyst 27.5 Mcg/spray Susp (Fluticasone  Furoate) .... Once Daily  Allergies (verified): 1)  ! Tylox  Past History:  Past Medical History: Last updated: 10/20/2008 1) CAD      --cath11/07: LM 40% LAD: 40%, D1 90% ZOX:WRUEAVWU in midsection filling from L to L collaterals                          RCA: codominant with 80-90& mid; RV branch 90% EF 50-55%. -> Medical rx 2) HTN 3) HL 4) Diabetes 5) h/o UTI and prostatitis 6) Erectile dysfunction 7) Carotid stenosis, very mild      --0-39% bilaterally 9/08 8) Obesity  Review of Systems       As per HPI and past medical history; otherwise all systems negative.   Vital Signs:  Patient profile:   70 year old male Height:      68 inches Weight:      192 pounds BMI:     29.30 Pulse rate:   63 / minute BP sitting:   154 / 82  (right arm) Cuff size:   regular  Vitals Entered By: Hardin Negus, RMA (September 15, 2009 10:34 AM)  Physical Exam  General:  Gen: well appearing. no resp difficulty HEENT: normal Neck: supple. no JVD. Carotids 2+ bilat; no bruits. No lymphadenopathy or thryomegaly appreciated. Cor: PMI nondisplaced. Regular rate & rhythm. No rubs, gallops, murmur. Lungs: clear Abdomen: soft,  nontender, nondistended. No hepatosplenomegaly. No bruits or masses. Good bowel sounds. Extremities: no cyanosis, clubbing, rash, edema Neuro: alert & orientedx3, cranial nerves grossly intact. moves all 4 extremities w/o difficulty. affect pleasant    Impression & Recommendations:  Problem # 1:  CAD, NATIVE VESSEL (ICD-414.01) Curry. No evidence of ischemia. Continue current regimen.  Problem # 2:  HYPERTENSION, BENIGN (ICD-401.1) BP doing very well. Has white coat syndrome. Continue current regimen.  Problem # 3:  HYPERLIPIDEMIA-MIXED (ICD-272.4) Lipids followed by [facility]. Looks great. Goal LDL < 70.Continue current regimen.  Other Orders: EKG w/ Interpretation (93000)  Patient Instructions: 1)  Follow up in 4 months

## 2010-10-04 NOTE — Progress Notes (Signed)
Summary: [facility]---RX  Phone Note Refill Request   Refills Requested: Medication #1:  AMOXIL 500 MG [facility]--[phone]  Initial call taken by: Freddy Jaksch,  Jan 09, 2009 9:30 AM  Follow-up for Phone Call        PT DID NOT REQUEST THIS RX PT SAYS Follow-up by: Kandice Hams,  Jan 10, 2009 12:03 PM

## 2010-10-04 NOTE — Letter (Signed)
[facility]   Imported By: Marylou Mccoy 06/20/2010 11:57:56  _____________________________________________________________________  External Attachment:    Type:   Image     Comment:   External Document

## 2010-10-04 NOTE — Assessment & Plan Note (Signed)
Summary: PER DR.HOPPER//VGJ   Vital Signs:  Patient Profile:   70 Years Old Male Weight:      185.50 pounds Temp:     97.2 degrees F oral Pulse rate:   72 / minute Pulse rhythm:   regular Resp:     18 per minute BP sitting:   146 / 82  (left arm)  Pt. in pain?   yes    Location:   R mid abd    Intensity:   4-10    Type:       tender to touch  Vitals Entered By: Wendall Stade (Jan 08, 2008 8:09 AM)                  Chief Complaint:  pt has hernia in his abdomen - pain 4/10- pt very aware of its presence - pt taking no otc for pain .  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 TYLOX     Review of Systems  General      Denies chills, fever, sweats, and weight loss.  GI      See HPI      Denies bloody stools, change in bowel habits, dark tarry stools, and indigestion.      Diarrhea from antibiotics better with Align   Physical Exam  General:     Well-developed,well-nourished,in no acute distress; alert,appropriate and cooperative throughout examination Lungs:     Normal respiratory effort, chest expands symmetrically. Lungs are clear to auscultation, no crackles or wheezes. Heart:     Normal rate and regular rhythm. S1 and S2 normal without gallop, murmur, click, rub or other extra sounds. Abdomen:     Bowel sounds positive,abdomen soft and minimally tender to R of midline below umbilical line without masses, organomegaly. Ventral hernia. 8X8.5 cm faint ecchymosis above tender area. 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 1 tab  once daily 2)  Plavix 75 Mg Tabs (Clopidogrel bisulfate) .Marland Kitchen.. 1 by mouth qd 3)  Coreg 12.5 Mg Tabs (Carvedilol) .Marland Kitchen.. 1 1/2 tabs daily 4)  Simvastatin 40 Mg Tabs (Simvastatin) .Marland Kitchen.. 1 by mouth qpm 5)  Lisinopril 20 Mg Tabs (Lisinopril) .Marland Kitchen.. 1 by mouth qd 6)  Adult Aspirin Ec Low Strength 81 Mg Tbec (Aspirin) .Marland Kitchen.. 1 by mouth qd 7)  Fish Oil  .... Once daily 8)  Saw Palmetto  .... Once daily 9)  Levitra 20 Mg Tabs (Vardenafil hcl) .... 1/2 -1 once daily prn 10)  Levaquin 500 Mg Tabs (Levofloxacin) .Marland Kitchen.. 1 qd   Patient Instructions: 1)  Avoid straining . Warm moist compresses three times a day as needed.   ]

## 2010-10-04 NOTE — Assessment & Plan Note (Signed)
Summary: f27m/need hbg a1c was not drawn on 01-05-09    Primary Diany Formosa:  Marga Melnick MD   History of Present Illness: Jacob Curry is a very pleasant 70 year old male with a history of multiple multivessel coronary artery disease being treated medically. He also has a history of hypertension, hyperlipidemia and diabetes.  He returns today for routine followup. Overall he is doing very well he continues with a maintenance program at cardiac rehabilitation 3 days a week. He has not had any exertional chest pain or dyspnea. He does note that his energy levels but a little bit low over the past few months. On check in at cardiac rehabilitation his blood pressures usually in the 105-110 range have her checked out he can go up as high as 135 or 140. he has not had problems with bleeding. He is not having orthopnea PND or lower extremity edema. No problem with meds. Occasional PVCs at rehab.  Chol 107 TG 88 HDL 47 LDL 42.  Current Medications (verified): 1)  Metformin Hcl 500 Mg  Tabs (Metformin Hcl) .Marland Kitchen.. 1 Tab Once Daily 2)  Plavix 75 Mg  Tabs (Clopidogrel Bisulfate) .Marland Kitchen.. 1 By Mouth Qd 3)  Coreg 12.5 Mg  Tabs (Carvedilol) .Marland Kitchen.. 1 1/2 Tabs Two Times A Day 4)  Simvastatin 40 Mg  Tabs (Simvastatin) .Marland Kitchen.. 1 By Mouth Qpm 5)  Lisinopril 20 Mg  Tabs (Lisinopril) .Marland Kitchen.. 1 By Mouth Qd 6)  Adult Aspirin Ec Low Strength 81 Mg  Tbec (Aspirin) .Marland Kitchen.. 1 By Mouth Qd 7)  Fish Oil .... Once Daily 8)  Saw Palmetto .... Two Times A Day 9)  Niaspan 500 Mg Cr-Tabs (Niacin (Antihyperlipidemic)) .Marland Kitchen.. 1 Tab At Bedtime  Allergies: 1)  ! Tylox  Past History:  Past Medical History:    1) CAD         --cath11/07: LM 40% LAD: 40%, D1 90% ZOX:WRUEAVWU in midsection filling from L to L collaterals                             RCA: codominant with 80-90& mid; RV branch 90% EF 50-55%. -> Medical rx    2) HTN    3) HL    4) Diabetes    5) h/o UTI and prostatitis    6) Erectile dysfunction    7) Carotid stenosis, very mild   --0-39% bilaterally 9/08    8) Obesity     (10/20/2008)  Review of Systems       As per HPI and past medical history; otherwise all systems negative.   Vital Signs:  Patient profile:   70 year old male Height:      68 inches Weight:      185 pounds BMI:     28.23 Pulse rate:   56 / minute BP sitting:   126 / 82  (right arm)  Physical Exam  General:  Well appearing. Ambulates clinic without any respiratory difficulty. HEENT: normal Neck: supple. no JVD. Carotids 2+ bilaterally without bruits. No lymphadenopathy or thryomegaly appreciated. Cor: PMI nondisplaced. Regulare rate and rhythm. No murmurs, rubs or gallops Lungs: clear to auscultation Abdomen: soft, nontender, nondistended. No hepatosplenomegaly. No bruits or masses. Good bowel sounds. Extremities: no cyanosis, clubbing, edema. No rash Neuro: alert and oriented x3, cranial nerves grossly intact. moves all 4 extremities without difficulty. affect pleasant    Impression & Recommendations:  Problem # 1:  CAD, NATIVE VESSEL (ICD-414.01) Curry. No evidence of ischemia.  Continue current regimen.  Problem # 2:  HYPERTENSION, BENIGN (ICD-401.1) BP mildly elevated here but well controlled at cardiac rehab. Continue current regimen.   Problem # 3:  HYPERLIPIDEMIA-MIXED (ICD-272.4) Looks great. Continue current regimen.  Other Orders: TLB-A1C / Hgb A1C (Glycohemoglobin) (83036-A1C)  Patient Instructions: 1)  Labs today-hgbA1C 2)  Labs in 3months- bmet, liver, lipid, hgbA1C 414.01, 401.1, 272.0, v58.69 3)  Follow up in 4 months

## 2010-10-04 NOTE — Assessment & Plan Note (Signed)
Summary: followup on u/a  /alr   Vital Signs:  Patient Profile:   70 Years Old Male Weight:      186 pounds Pulse rate:   64 / minute Pulse rhythm:   regular Resp:     14 per minute BP sitting:   112 / 60  (left arm) Cuff size:   large  Pt. in pain?   no  Vitals Entered By: Wendall Stade (August 17, 2007 10:48 AM)                  Chief Complaint:  follow up.  History of Present Illness: No dysuria but having frequency, 8-10 X/day. BMs X 5 08/16/07 w/o diarrhea.No F,C, sweats. Glucoses not checked. S/P 10 days of Macrobid & 7 days of Levaquin , 2 days left.  Current Allergies (reviewed today): ! TYLOX  Past Medical History:    Reviewed history and no changes required:       UTI       acute prostatitis       erectile dysfunction       dysuria       metabolic syndrome       CAD       FAMILY HISTORY 1st degree relative  Past Surgical History:    Reviewed history from 02/18/2007 and no changes required:       Appendectomy-age 35       Broken right ankle sx x 11-1991       Hand trauma-1965       Colonoscopy- neg -2002 due 2012    Family History:    Reviewed history from 02/18/2007 and no changes required:       Family History Diabetes 1st degree relative       Fam hx Colon CA       FAther -bypass age 64  Social History:    Reviewed history from 02/18/2007 and no changes required:       Former Smoker       Alcohol use-no    Review of Systems  GU      Complains of urinary frequency and urinary hesitancy.      Denies discharge, dysuria, genital sores, hematuria, incontinence, and nocturia.   Physical Exam  General:     Well-developed,well-nourished,in no acute distress; alert,appropriate and cooperative throughout examination Abdomen:     Bowel sounds positive,abdomen soft and non-tender without masses, organomegaly or hernias noted. No flank tenderness Skin:     Minor excoriated lesions of chest Cervical Nodes:     No lymphadenopathy  noted Axillary Nodes:     No palpable lymphadenopathy    Impression & Recommendations:  Problem # 1:  UTI (ICD-599.0)  The following medications were removed from the medication list:    Amoxil 500 Mg Caps (Amoxicillin) .Marland Kitchen... 1 three times a day  His updated medication list for this problem includes:    Levaquin 500 Mg Tabs (Levofloxacin) .Marland Kitchen... 1 qd  Orders: Urology Referral (Urology)   Complete Medication List: 1)  Metformin Hcl 500 Mg Tabs (Metformin hcl) .Marland Kitchen.. 1 tab once daily 2)  Plavix 75 Mg Tabs (Clopidogrel bisulfate) .Marland Kitchen.. 1 by mouth qd 3)  Coreg 12.5 Mg Tabs (Carvedilol) .Marland Kitchen.. 1 1/2 tabs daily 4)  Simvastatin 40 Mg Tabs (Simvastatin) .Marland Kitchen.. 1 by mouth qpm 5)  Lisinopril 20 Mg Tabs (Lisinopril) .Marland Kitchen.. 1 by mouth qd 6)  Adult Aspirin Ec Low Strength 81 Mg Tbec (Aspirin) .Marland Kitchen.. 1 by mouth qd 7)  Fish Oil  .Marland KitchenMarland KitchenMarland Kitchen  Once daily 8)  Saw Palmetto  .... Once daily 9)  Levitra 20 Mg Tabs (Vardenafil hcl) .... 1/2 -1 once daily prn 10)  Levaquin 500 Mg Tabs (Levofloxacin) .Marland Kitchen.. 1 qd   Patient Instructions: 1)  Monitor glucoses. Report diarrhea; take Probiotic once daily  2)  Drink as much fluid as you can tolerate for the next few days.    ]

## 2010-10-04 NOTE — Cardiovascular Report (Signed)
Summary: Pre Cath Orders   Pre Cath Orders   Imported By: Roderic Ovens 02/07/2010 15:25:18  _____________________________________________________________________  External Attachment:    Type:   Image     Comment:   External Document

## 2010-10-04 NOTE — Assessment & Plan Note (Signed)
Summary: snoring, sleep evaluation//jwr   Primary Provider/Referring Provider:  Marga Melnick MD  CC:  Sleep Consult-Dr. Gala Romney; pt's wife states that the patient snores and ? stop breathing at times(few seconds at a time).  History of Present Illness: July 19, 2010- 70 yoM here w/ wife on kind referal from Dr Gala Romney concerned about possible sleep apnea. He is noted to snore, occasionally pause, and to be sleepy in the daytime. Wife says snoting has gone on for years, worse when on his back. 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 Does not fall asleep at work, even when quiet and able to sit. Only decaf coffee. Bedtime 11Pm latency 2 minutes/ no meds. Wakes once during night then up at 530AM.  Staff reported he was hard to wake after anesthesia for foot surgery in the past.  Hx MI 2008, no thyroid or lung disease. ENT- only tonsils.  Preventive Screening-Counseling & Management  Alcohol-Tobacco     Smoking Status: quit     Packs/Day: 1.0     Year Started: age 70     Year Quit: 1992  Current Medications (verified): 1)  Metformin Hcl 500 Mg  Tabs (Metformin Hcl) .... Take 1 By Mouth Once Daily 2)  Plavix 75 Mg  Tabs (Clopidogrel Bisulfate) .Marland Kitchen.. 1 By Mouth Qd 3)  Coreg 12.5 Mg  Tabs (Carvedilol) .Marland Kitchen.. 1 & 1/2  Tabs Two Times A Day 4)  Simvastatin 40 Mg  Tabs (Simvastatin) .Marland Kitchen.. 1 By Mouth Qpm 5)  Lisinopril 20 Mg  Tabs (Lisinopril) .... 2 By Mouth Once Daily 6)  Adult Aspirin Ec Low Strength 81 Mg  Tbec (Aspirin) .Marland Kitchen.. 1 By Mouth Qd 7)  Fish Oil 1000 Mg Caps (Omega-3 Fatty Acids) .... Take 1 By Mouth Once Daily 8)  Saw Palmetto .... Two Times A Day 9)  Niaspan 500 Mg Cr-Tabs (Niacin (Antihyperlipidemic)) .Marland Kitchen.. 1 By Mouth Daily 10)  Nitrostat 0.4 Mg Subl (Nitroglycerin) .Marland Kitchen.. 1 Tablet Under Tongue At Onset of Chest Pain; You May Repeat Every 5 Minutes For Up To 3 Doses.  Allergies: 1)  ! Tylox 2)  ! Acetaminophen  Past History:  Past Medical History: 1) CAD      -- Mi 2008  --cath11/07: LM 40% LAD: 40%, D1 90% [id number] in midsection filling from L to L collaterals                          RCA: codominant with 80-90& mid; RV branch 90% EF 50-55%. 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  The patient denies shortness of breath at rest, productive cough, non-productive cough, coughing up blood, chest pain, irregular heartbeats, acid heartburn, indigestion, loss of appetite, weight change, abdominal pain, difficulty swallowing, sore throat, tooth/dental problems, headaches, nasal congestion/difficulty breathing through nose, sneezing, itching, ear ache, anxiety,  depression, hand/feet swelling, joint stiffness or pain, rash, change in color of mucus, and fever.    Vital Signs:  Patient profile:   70 year old male Height:      68 inches Weight:      194.38 pounds BMI:     29.66 O2 Sat:      97 % on Room air Pulse rate:   60 / minute BP sitting:   122 / 76  (left arm) Cuff size:   regular  Vitals Entered By: Reynaldo Minium CMA (July 19, 2010 2:43 PM)  O2 Flow:  Room air CC: Sleep Consult-Dr. Bensimhon; pt's wife states that the patient snores and ? stop breathing at times(few seconds at a time)   Physical Exam  Additional Exam:  General: A/Ox3; pleasant and cooperative, NAD, overweight SKIN: no rash, lesions NODES: no  lymphadenopathy HEENT: [place]/AT, EOM- WNL, Conjuctivae- clear, PERRLA, TM-WNL, Nose- clear, Throat- clear and wnl, Mallampati  II-III NECK: Supple w/ fair ROM, JVD- none, normal carotid impulses w/o bruits Thyroid- normal to palpation CHEST: Clear to P&A HEART: RRR, no m/g/r heard ABDOMEN: Soft and nl; nml bowel sounds; no organomegaly or masses noted, abdominal obesisty JYN:WGNF, nl pulses, no edema  NEURO: Grossly intact to observation       Impression & Recommendations:  Problem # 1:  SNORING (ICD-786.09)  Probable obstructive sleep apnea, but hard to estimate severity. We discussed the physiology, medical concerns and diagnostic evaluation. He agrees to go forward with a sleep study. His updated medication list for this problem includes:    Coreg 12.5 Mg Tabs (Carvedilol) .Marland Kitchen... 1 & 1/2  tabs two times a day    Lisinopril 20 Mg Tabs (Lisinopril) .Marland Kitchen... 2 by mouth once daily  Medications Added to Medication List This Visit: 1)  Fish Oil 1000 Mg Caps (Omega-3 fatty acids) .... Take 1 by mouth once daily  Other Orders: Consultation Level III (62130) Sleep Disorder Referral (Sleep Disorder)  Patient Instructions: 1)  Please schedule a follow-up appointment in 1 month. 2)  See [facility] to schedule sleep study

## 2010-10-04 NOTE — Letter (Signed)
Summary: [facility]  [facility]   Imported By: Lanelle Bal 08/03/2010 11:07:25  _____________________________________________________________________  External Attachment:    Type:   Image     Comment:   External Document

## 2010-10-04 NOTE — Assessment & Plan Note (Signed)
Summary: 4 month rov/sl  Medications Added COREG 12.5 MG  TABS (CARVEDILOL) 1 tabs two times a day SIMVASTATIN 40 MG  TABS (SIMVASTATIN) 1 by mouth qpm NIASPAN 500 MG CR-TABS (NIACIN (ANTIHYPERLIPIDEMIC)) 1 by mouth daily NITROSTAT 0.4 MG SUBL (NITROGLYCERIN) 1 tablet under tongue at onset of chest pain; you may repeat every 5 minutes for up to 3 doses.      Allergies Added:   Visit Type:  Follow-up Primary Provider:  Marga Melnick MD  CC:  no complaints.  History of Present Illness: Jacob Curry is a very pleasant 70 year old male with a history of multiple multivessel coronary artery disease being treated medically. He also has a history of hypertension, hyperlipidemia and diabetes.  He returns today for routine followup. Overall he is doing very well he continues with a maintenance program at cardiac rehabilitation 3 days a week.  He has not had any exertional chest pain or dyspnea.   When exercising cannot get his HR over 95 despite good effort. On check in at cardiac rehabilitation his systolic blood pressures usually in the 110-120 range.  Otherwise very active can do anything he wants. Using chainsaw, planting a garden, etc. He is not having orthopnea PND or lower extremity edema. No problem with meds.  TC 100 TG 132 HDL 45 LDL 29 HgbA1c 6.2   Current Medications (verified): 1)  Metformin Hcl 500 Mg  Tabs (Metformin Hcl) .Marland Kitchen.. 1 Two Times A Day With 2 Largest Meals 2)  Plavix 75 Mg  Tabs (Clopidogrel Bisulfate) .Marland Kitchen.. 1 By Mouth Qd 3)  Coreg 12.5 Mg  Tabs (Carvedilol) .Marland Kitchen.. 1 1/2 Tabs Two Times A Day 4)  Simvastatin 40 Mg  Tabs (Simvastatin) .Marland Kitchen.. 1 By Mouth Qpm 5)  Lisinopril 20 Mg  Tabs (Lisinopril) .... 2 By Mouth Once Daily 6)  Adult Aspirin Ec Low Strength 81 Mg  Tbec (Aspirin) .Marland Kitchen.. 1 By Mouth Qd 7)  Fish Oil .... Once Daily 8)  Saw Palmetto .... Two Times A Day 9)  Niaspan 500 Mg Cr-Tabs (Niacin (Antihyperlipidemic)) .Marland Kitchen.. 1 By Mouth Daily  Allergies (verified): 1)  !  Tylox  Past History:  Past Medical History: Last updated: 10/20/2008 1) CAD      --cath11/07: LM 40% LAD: 40%, D1 90% [id number] in midsection filling from L to L collaterals                          RCA: codominant with 80-90& mid; RV branch 90% EF 50-55%. -> Medical rx 2) HTN 3) HL 4) Diabetes 5) h/o UTI and prostatitis 6) Erectile dysfunction 7) Carotid stenosis, very mild      --0-39% bilaterally 9/08 8) Obesity  Review of Systems       As per HPI and past medical history; otherwise all systems negative.   Vital Signs:  Patient profile:   70 year old male Height:      68 inches Weight:      190 pounds BMI:     28.99 Pulse rate:   62 / minute Resp:     16 per minute BP sitting:   130 / 70  (right arm)  Vitals Entered By: Marrion Coy, CNA (Jan 01, 2010 9:51 AM)  Physical Exam  General:  Gen: well appearing. no resp difficulty HEENT: normal Neck: supple. no JVD. Carotids 2+ bilat; no bruits. No lymphadenopathy or thryomegaly appreciated. Cor: PMI nondisplaced. Regular rate & rhythm. No rubs, gallops, murmur. Lungs: clear Abdomen: soft,  nontender, nondistended. No hepatosplenomegaly. No bruits or masses. Good bowel sounds. Extremities: no cyanosis, clubbing, rash, edema Neuro: alert & orientedx3, cranial nerves grossly intact. moves all 4 extremities w/o difficulty. affect pleasant    Impression & Recommendations:  Problem # 1:  CAD, NATIVE VESSEL (ICD-414.01) Doing very well. Previously CAD was relatively asx. Now almost 4 years out from cath, will get ETT/Myoview to re-evalaute ischemic burden.   Problem # 2:  HYPERTENSION, BENIGN (ICD-401.1) Blood pressure well controlled. Continue current regimen.  Problem # 3:  HYPERLIPIDEMIA-MIXED (ICD-272.4) Lipids look very good. Reassured him that LDL is not too low.  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 Would like to see HR > 110-110 with exercise. Cut coreg back to 12.5 two  times a day and do not take before exercise. If BP drifts up may need to titrate other agents.  Other Orders: Nuclear Stress Test (Nuc Stress Test)  Patient Instructions: 1)  Decrease Carvedilol to 12.5mg  two times a day  2)  Your physician has requested that you have an exercise stress myoview.  For further information please visit [email or web address]  Please follow instruction sheet, as given. 3)  Follow up in 4 months Prescriptions: NITROSTAT 0.4 MG SUBL (NITROGLYCERIN) 1 tablet under tongue at onset of chest pain; you may repeat every 5 minutes for up to 3 doses.  #25 x 3   Entered by:   Meredith Staggers, RN   Authorized by:   Dolores Patty, MD, [facility]   Signed by:   Meredith Staggers, RN on 01/01/2010   Method used:   Electronically to        [facility] [address]* (retail)       [address]       [place], [place]  [address]       Ph: [phone]       Fax: [phone]   RxID:   [id number]

## 2010-10-04 NOTE — Letter (Signed)
Summary: Custom - Lipid  [facility], Main Office  [address]   [place], [place] [address]   Phone: [phone]  Fax: [phone]     April 21, 2009 MRN: [id number]   [name] [address] [place], [place]  [address]   Dear Mr. Hagerman,  We have reviewed your cholesterol results.  They are as follows:     Total Cholesterol:    101 (Desirable: less than 200)       HDL  Cholesterol:     41.70  (Desirable: greater than 40 for men and 50 for women)       LDL Cholesterol:       43  (Desirable: less than 100 for low risk and less than 70 for moderate to high risk)       Triglycerides:       80.0  (Desirable: less than 150)  Our recommendations include:  Looks Great, we will see you 8/24   Call our office at the number listed above if you have any questions.  Lowering your LDL cholesterol is important, but it is only one of a large number of "risk factors" that may indicate that you are at risk for heart disease, stroke or other complications of hardening of the arteries.  Other risk factors include:   A.  Cigarette Smoking* B.  High Blood Pressure* C.  Obesity* D.   Low HDL Cholesterol (see yours above)* E.   Diabetes Mellitus (higher risk if your is uncontrolled) F.  Family history of premature heart disease G.  Previous history of stroke or cardiovascular disease    *These are risk factors YOU HAVE CONTROL OVER.  For more information, visit .  There is now evidence that lowering the TOTAL CHOLESTEROL AND LDL CHOLESTEROL can reduce the risk of heart disease.  The American Heart Association recommends the following guidelines for the treatment of elevated cholesterol:  1.  If there is now current heart disease and less than two risk factors, TOTAL CHOLESTEROL should be less than 200 and LDL CHOLESTEROL should be less than 100. 2.  If there is current heart disease or two or more risk factors, TOTAL CHOLESTEROL should be less than 200 and  LDL CHOLESTEROL should be less than 70.  A diet low in cholesterol, saturated fat, and calories is the cornerstone of treatment for elevated cholesterol.  Cessation of smoking and exercise are also important in the management of elevated cholesterol and preventing vascular disease.  Studies have shown that 30 to 60 minutes of physical activity most days can help lower blood pressure, lower cholesterol, and keep your weight at a healthy level.  Drug therapy is used when cholesterol levels do not respond to therapeutic lifestyle changes (smoking cessation, diet, and exercise) and remains unacceptably high.  If medication is started, it is important to have you levels checked periodically to evaluate the need for further treatment options.  Thank you,    [employer] Team

## 2010-10-04 NOTE — Assessment & Plan Note (Signed)
Summary: 4 mo f/u ./cy      Allergies Added:   Visit Type:  Follow-up Primary Provider:  Marga Melnick MD  CC:  no compliants.  History of Present Illness: Jacob Curry is a very pleasant 70 year old male with a history of multiple multivessel coronary artery disease being treated medically. He also has a history of hypertension, hyperlipidemia and diabetes.  Recently had a f/u Myoview with large infero-lateral reversible defect. F/u cath in 5/11:  1. Coronary artery disease with chronic total occlusion of a large     left circumflex, which is collateralized by the left anterior     descending coronary artery.  This is unchanged from previous.     Otherwise catheterization notable for branch vessel coronary artery     disease. 2. Left ventricular ejection fraction was 50-55% with lateral     hypokinesis.  We discussed possibility of opening his CTO. But felt it was high risk and given lack of symptoms we are proceeding with medical therapy.   Doing great. Continues to go to [facility]. No CP. Heart rate stays 70-80 with exercise with RPE 12. Has stopped cardvedilol and made no difference. On stress test able to get up to 128 bpm. No orthopnea or PND.  Recent lipids TC 96 TG 85 HDL 41 LDL 38. Having problems with his prostate. Snores alot and wife says he stops breathing. fall asleep at any time.   Current Medications (verified): 1)  Metformin Hcl 500 Mg  Tabs (Metformin Hcl) .... Take 1 By Mouth Once Daily 2)  Plavix 75 Mg  Tabs (Clopidogrel Bisulfate) .Marland Kitchen.. 1 By Mouth Qd 3)  Coreg 12.5 Mg  Tabs (Carvedilol) .Marland Kitchen.. 1 & 1/2  Tabs Two Times A Day 4)  Simvastatin 40 Mg  Tabs (Simvastatin) .Marland Kitchen.. 1 By Mouth Qpm 5)  Lisinopril 20 Mg  Tabs (Lisinopril) .... 2 By Mouth Once Daily 6)  Adult Aspirin Ec Low Strength 81 Mg  Tbec (Aspirin) .Marland Kitchen.. 1 By Mouth Qd 7)  Fish Oil .... Once Daily 8)  Saw Palmetto .... Two Times A Day 9)  Niaspan 500 Mg Cr-Tabs (Niacin (Antihyperlipidemic)) .Marland Kitchen.. 1 By Mouth Daily 10)   Nitrostat 0.4 Mg Subl (Nitroglycerin) .Marland Kitchen.. 1 Tablet Under Tongue At Onset of Chest Pain; You May Repeat Every 5 Minutes For Up To 3 Doses.  Allergies (verified): 1)  ! Tylox  Review of Systems       As per HPI and past medical history; otherwise all systems negative.    Vital Signs:  Patient profile:   70 year old male Height:      68 inches Weight:      192 pounds BMI:     29.30 Pulse rate:   55 / minute BP sitting:   122 / 64  (left arm) Cuff size:   regular  Vitals Entered By: Hardin Negus, RMA (June 07, 2010 8:24 AM)  Physical Exam  General:  Well appearing. no resp difficulty HEENT: normal Neck: supple. no JVD. Carotids 2+ bilat; no bruits. No lymphadenopathy or thryomegaly appreciated. Cor: PMI nondisplaced. Regular rate & rhythm. No rubs, gallops, murmur. Lungs: clear Abdomen: soft, nontender, nondistended. No hepatosplenomegaly. No bruits or masses. Good bowel sounds. Extremities: no cyanosis, clubbing, rash, edema . groin ok Neuro: alert & orientedx3, cranial nerves grossly intact. moves all 4 extremities w/o difficulty. affect pleasant    Impression & Recommendations:  Problem # 1:  CAD, NATIVE VESSEL (ICD-414.01) Curry. Doing well. Continue medical rx. Asked him to try  to walk on TM 1-2x/week to see if he can get HR up to 90-100 range.   Problem # 2:  HYPERLIPIDEMIA-MIXED (ICD-272.4) Lipids look very good. Reassured him that LDL is not too low.  Problem # 3:  Snoring Very concerning for OSA. Refer to Dr. Maple Hudson for sleep eval.  Other Orders: EKG w/ Interpretation (93000) Misc. Referral (Misc. Ref)  Patient Instructions: 1)  Your physician recommends that you schedule a follow-up appointment in: 6 months 2)  Your physician recommends that you continue on your current medications as directed. Please refer to the Current Medication list given to you today. 3)  You have been referred to [facility] for evaluation for your pulmonary function and for a  sleep study.

## 2010-10-04 NOTE — Progress Notes (Signed)
Summary: returning call**LMTCB*** cath 01-19-10   Phone Note Call from Patient Call back at Work Phone [phone]   Caller: Patient Reason for Call: Talk to Nurse Summary of Call: returning call Initial call taken by: Migdalia Dk,  Jan 11, 2010 3:57 PM  Follow-up for Phone Call        pt aware of myoview results, will plan cath for Fri 5/20, will have mess nurse tom sch time and call pt w/time and instructions Meredith Staggers, RN  May 12, 2:011 4:10 PM  JV cath scheduled for 01/19/10 8:30am--LMTCB Katina Dung, RN, BSN  Jan 12, 2010 8:41 AM  need to schedule lab prior to cath Katina Dung, RN, BSN  Jan 12, 2010 8:41 AM   Additional Follow-up for Phone Call Additional follow up Details #1::        talked with pt by telephone--JV cath 01/19/10 at 8:30am--lab 01-15-10

## 2010-10-05 ENCOUNTER — Encounter (HOSPITAL_COMMUNITY): Payer: Medicare Other

## 2010-10-09 ENCOUNTER — Encounter (HOSPITAL_COMMUNITY): Payer: Medicare Other

## 2010-10-11 ENCOUNTER — Encounter (HOSPITAL_COMMUNITY): Payer: Medicare Other

## 2010-10-12 ENCOUNTER — Encounter (HOSPITAL_COMMUNITY): Payer: Medicare Other

## 2010-10-16 ENCOUNTER — Encounter (HOSPITAL_COMMUNITY): Payer: Medicare Other

## 2010-10-18 ENCOUNTER — Encounter (HOSPITAL_COMMUNITY): Payer: Medicare Other

## 2010-10-18 NOTE — Letter (Signed)
Summary: Eye Exam/[facility]  Eye Exam/[facility]   Imported By: Maryln Gottron 10/09/2010 10:45:03  _____________________________________________________________________  External Attachment:    Type:   Image     Comment:   External Document

## 2010-10-18 NOTE — Miscellaneous (Signed)
Summary: [facility]   [facility]   Imported By: Roderic Ovens 10/08/2010 14:26:15  _____________________________________________________________________  External Attachment:    Type:   Image     Comment:   External Document

## 2010-10-19 ENCOUNTER — Encounter (HOSPITAL_COMMUNITY): Payer: Medicare Other

## 2010-10-23 ENCOUNTER — Encounter (HOSPITAL_COMMUNITY): Payer: Medicare Other

## 2010-10-25 ENCOUNTER — Encounter (HOSPITAL_COMMUNITY): Payer: Medicare Other

## 2010-10-26 ENCOUNTER — Encounter (HOSPITAL_COMMUNITY): Payer: Medicare Other

## 2010-10-30 ENCOUNTER — Encounter (HOSPITAL_COMMUNITY): Payer: Medicare Other

## 2010-11-01 ENCOUNTER — Encounter (HOSPITAL_COMMUNITY): Payer: Medicare Other | Attending: Internal Medicine

## 2010-11-01 DIAGNOSIS — Z87891 Personal history of nicotine dependence: Secondary | ICD-10-CM | POA: Insufficient documentation

## 2010-11-01 DIAGNOSIS — E663 Overweight: Secondary | ICD-10-CM | POA: Insufficient documentation

## 2010-11-01 DIAGNOSIS — Z9861 Coronary angioplasty status: Secondary | ICD-10-CM | POA: Insufficient documentation

## 2010-11-01 DIAGNOSIS — I2510 Atherosclerotic heart disease of native coronary artery without angina pectoris: Secondary | ICD-10-CM | POA: Insufficient documentation

## 2010-11-01 DIAGNOSIS — Z5189 Encounter for other specified aftercare: Secondary | ICD-10-CM | POA: Insufficient documentation

## 2010-11-01 DIAGNOSIS — R0602 Shortness of breath: Secondary | ICD-10-CM | POA: Insufficient documentation

## 2010-11-01 DIAGNOSIS — I10 Essential (primary) hypertension: Secondary | ICD-10-CM | POA: Insufficient documentation

## 2010-11-01 DIAGNOSIS — E8881 Metabolic syndrome: Secondary | ICD-10-CM | POA: Insufficient documentation

## 2010-11-01 DIAGNOSIS — I209 Angina pectoris, unspecified: Secondary | ICD-10-CM | POA: Insufficient documentation

## 2010-11-01 DIAGNOSIS — E785 Hyperlipidemia, unspecified: Secondary | ICD-10-CM | POA: Insufficient documentation

## 2010-11-01 DIAGNOSIS — Z7982 Long term (current) use of aspirin: Secondary | ICD-10-CM | POA: Insufficient documentation

## 2010-11-02 ENCOUNTER — Encounter (HOSPITAL_COMMUNITY): Payer: Medicare Other

## 2010-11-06 ENCOUNTER — Encounter (HOSPITAL_COMMUNITY): Payer: Medicare Other

## 2010-11-08 ENCOUNTER — Encounter (HOSPITAL_COMMUNITY): Payer: Medicare Other

## 2010-11-09 ENCOUNTER — Encounter (HOSPITAL_COMMUNITY): Payer: Medicare Other

## 2010-11-13 ENCOUNTER — Encounter (HOSPITAL_COMMUNITY): Payer: Medicare Other

## 2010-11-15 ENCOUNTER — Encounter (HOSPITAL_COMMUNITY): Payer: Medicare Other

## 2010-11-16 ENCOUNTER — Encounter (HOSPITAL_COMMUNITY): Payer: Medicare Other

## 2010-11-19 LAB — POCT I-STAT GLUCOSE
Glucose, Bld: 138 mg/dL — ABNORMAL HIGH (ref 70–99)
Operator id: 133881

## 2010-11-20 ENCOUNTER — Encounter (HOSPITAL_COMMUNITY): Payer: Medicare Other

## 2010-11-22 ENCOUNTER — Encounter (HOSPITAL_COMMUNITY): Payer: Medicare Other

## 2010-11-23 ENCOUNTER — Encounter (HOSPITAL_COMMUNITY): Payer: Medicare Other

## 2010-11-27 ENCOUNTER — Telehealth: Payer: Self-pay | Admitting: Internal Medicine

## 2010-11-27 ENCOUNTER — Encounter (HOSPITAL_COMMUNITY): Payer: Medicare Other

## 2010-11-27 ENCOUNTER — Encounter: Payer: Self-pay | Admitting: Internal Medicine

## 2010-11-27 NOTE — Telephone Encounter (Signed)
Left message to call back  

## 2010-11-28 ENCOUNTER — Telehealth: Payer: Self-pay | Admitting: Internal Medicine

## 2010-11-28 NOTE — Telephone Encounter (Signed)
Spoke w/pt wants to know if we can check hgb A1C for him and his wife w/there labwork b/c they are seeing pcp soon also, lab added

## 2010-11-28 NOTE — Telephone Encounter (Signed)
Done in phone mess dated 3/28

## 2010-11-29 ENCOUNTER — Encounter (HOSPITAL_COMMUNITY): Payer: Medicare Other

## 2010-11-30 ENCOUNTER — Encounter (HOSPITAL_COMMUNITY): Payer: Medicare Other

## 2010-12-04 ENCOUNTER — Encounter (HOSPITAL_COMMUNITY): Payer: Medicare Other | Attending: Internal Medicine

## 2010-12-04 DIAGNOSIS — Z7982 Long term (current) use of aspirin: Secondary | ICD-10-CM | POA: Insufficient documentation

## 2010-12-04 DIAGNOSIS — I2510 Atherosclerotic heart disease of native coronary artery without angina pectoris: Secondary | ICD-10-CM | POA: Insufficient documentation

## 2010-12-04 DIAGNOSIS — E8881 Metabolic syndrome: Secondary | ICD-10-CM | POA: Insufficient documentation

## 2010-12-04 DIAGNOSIS — Z5189 Encounter for other specified aftercare: Secondary | ICD-10-CM | POA: Insufficient documentation

## 2010-12-04 DIAGNOSIS — R0602 Shortness of breath: Secondary | ICD-10-CM | POA: Insufficient documentation

## 2010-12-04 DIAGNOSIS — I209 Angina pectoris, unspecified: Secondary | ICD-10-CM | POA: Insufficient documentation

## 2010-12-04 DIAGNOSIS — Z9861 Coronary angioplasty status: Secondary | ICD-10-CM | POA: Insufficient documentation

## 2010-12-04 DIAGNOSIS — E663 Overweight: Secondary | ICD-10-CM | POA: Insufficient documentation

## 2010-12-04 DIAGNOSIS — I10 Essential (primary) hypertension: Secondary | ICD-10-CM | POA: Insufficient documentation

## 2010-12-04 DIAGNOSIS — Z87891 Personal history of nicotine dependence: Secondary | ICD-10-CM | POA: Insufficient documentation

## 2010-12-04 DIAGNOSIS — E785 Hyperlipidemia, unspecified: Secondary | ICD-10-CM | POA: Insufficient documentation

## 2010-12-06 ENCOUNTER — Encounter (HOSPITAL_COMMUNITY): Payer: Medicare Other

## 2010-12-07 ENCOUNTER — Encounter (HOSPITAL_COMMUNITY): Payer: Medicare Other

## 2010-12-10 ENCOUNTER — Other Ambulatory Visit (INDEPENDENT_AMBULATORY_CARE_PROVIDER_SITE_OTHER): Payer: Medicare Other | Admitting: *Deleted

## 2010-12-10 DIAGNOSIS — I2510 Atherosclerotic heart disease of native coronary artery without angina pectoris: Secondary | ICD-10-CM

## 2010-12-10 DIAGNOSIS — Z79899 Other long term (current) drug therapy: Secondary | ICD-10-CM

## 2010-12-10 DIAGNOSIS — E7800 Pure hypercholesterolemia, unspecified: Secondary | ICD-10-CM

## 2010-12-10 LAB — BASIC METABOLIC PANEL
BUN: 14 mg/dL (ref 6–23)
CO2: 26 mEq/L (ref 19–32)
Calcium: 8.8 mg/dL (ref 8.4–10.5)
Chloride: 106 mEq/L (ref 96–112)
Creatinine, Ser: 0.9 mg/dL (ref 0.4–1.5)
GFR: 85.39 mL/min (ref >60.00)
Glucose, Bld: 101 mg/dL — ABNORMAL HIGH (ref 70–99)
Potassium: 4.1 mEq/L (ref 3.5–5.1)
Sodium: 139 mEq/L (ref 135–145)

## 2010-12-10 LAB — HEPATIC FUNCTION PANEL
ALT: 24 U/L (ref 0–53)
AST: 23 U/L (ref 0–37)
Albumin: 3.8 g/dL (ref 3.5–5.2)
Alkaline Phosphatase: 59 U/L (ref 39–117)
Bilirubin, Direct: 0.2 mg/dL (ref 0.0–0.3)
Total Bilirubin: 0.9 mg/dL (ref 0.3–1.2)
Total Protein: 6.5 g/dL (ref 6.0–8.3)

## 2010-12-10 LAB — LIPID PANEL
Cholesterol: 102 mg/dL (ref 0–200)
HDL: 41.3 mg/dL (ref >39.00)
LDL Cholesterol: 41 mg/dL (ref 0–99)
Total CHOL/HDL Ratio: 2
Triglycerides: 100 mg/dL (ref 0.0–149.0)
VLDL: 20 mg/dL (ref 0.0–40.0)

## 2010-12-10 LAB — HEMOGLOBIN A1C: Hgb A1c MFr Bld: 6.3 % (ref 4.6–6.5)

## 2010-12-11 ENCOUNTER — Encounter (HOSPITAL_COMMUNITY): Payer: Medicare Other

## 2010-12-13 ENCOUNTER — Encounter (HOSPITAL_COMMUNITY): Payer: Medicare Other

## 2010-12-14 ENCOUNTER — Encounter (HOSPITAL_COMMUNITY): Payer: Medicare Other

## 2010-12-15 ENCOUNTER — Encounter: Payer: Self-pay | Admitting: Internal Medicine

## 2010-12-18 ENCOUNTER — Ambulatory Visit (INDEPENDENT_AMBULATORY_CARE_PROVIDER_SITE_OTHER): Payer: Medicare Other | Admitting: Internal Medicine

## 2010-12-18 ENCOUNTER — Encounter: Payer: Self-pay | Admitting: Internal Medicine

## 2010-12-18 ENCOUNTER — Encounter (HOSPITAL_COMMUNITY): Payer: Medicare Other

## 2010-12-18 VITALS — BP 134/68 | HR 59 | Ht 68.0 in | Wt 184.0 lb

## 2010-12-18 DIAGNOSIS — E785 Hyperlipidemia, unspecified: Secondary | ICD-10-CM

## 2010-12-18 DIAGNOSIS — I2510 Atherosclerotic heart disease of native coronary artery without angina pectoris: Secondary | ICD-10-CM

## 2010-12-18 DIAGNOSIS — I10 Essential (primary) hypertension: Secondary | ICD-10-CM

## 2010-12-18 MED ORDER — NITROGLYCERIN 0.4 MG SL SUBL: 0.4000 mg | SUBLINGUAL_TABLET | SUBLINGUAL | Status: DC | PRN

## 2010-12-18 NOTE — Assessment & Plan Note (Signed)
Lipids look great. Continue current regimen.

## 2010-12-18 NOTE — Patient Instructions (Signed)
Follow up in 6 months  Continue current medications

## 2010-12-18 NOTE — Assessment & Plan Note (Signed)
No evidence of ischemia. Continue current regimen including cardiac rehab.

## 2010-12-18 NOTE — Assessment & Plan Note (Signed)
Blood pressure well controlled. Continue current regimen.  

## 2010-12-18 NOTE — Progress Notes (Signed)
HPI:  Jacob Curry is a very pleasant 70 year old male with a history of multiple multivessel coronary artery disease being treated medically. He also has a history of hypertension, hyperlipidemia and diabetes.  Recently had a f/u Myoview with large infero-lateral reversible defect. F/u cath in 5/11:  1. Coronary artery disease with chronic total occlusion of a large     left circumflex, which is collateralized by the left anterior     descending coronary artery.  This is unchanged from previous.     Otherwise catheterization notable for branch vessel coronary artery     disease. 2. Left ventricular ejection fraction was 50-55% with lateral     hypokinesis.  We discussed possibility of opening his CTO. But felt it was high risk and given lack of symptoms we are proceeding with medical therapy. At the end of 2011, diagnosed with moderate OSA (AHI 20). Unable to tolerate CPAP due to claustrophobia so now using oral appliance and snoring essentially resolved. Less fatigued in the afternoons.  Doing well from a cardiac standpoint. Continues to go to CR. No CP. Heart rate stays 80-90 with exercise with RPE 12. BP 102-125/60-70 Recent lipids TC 102 TG 100 HDL 41 LDL 40.   Doing Weight [employer] online and has lost 11 pounds.  ROS: All systems negative except as listed in HPI, PMH and Problem List.  Past Medical History  Diagnosis Date  . CAD (coronary artery disease)   . HTN (hypertension)   . HLD (hyperlipidemia)   . DM (diabetes mellitus)   . History of UTI   . ED (erectile dysfunction)   . Carotid stenosis   . Obesity   . OSA on CPAP     Current Outpatient Prescriptions  Medication Sig Dispense Refill  . aspirin 81 MG tablet Take 81 mg by mouth daily.        . carvedilol (COREG) 12.5 MG tablet Take 18.75 mg by mouth 2 (two) times daily with a meal.        . clopidogrel (PLAVIX) 75 MG tablet Take 75 mg by mouth daily.        . fish oil-omega-3 fatty acids 1000 MG capsule Take 2 g by mouth  daily.        Marland Kitchen lisinopril (PRINIVIL,ZESTRIL) 20 MG tablet Take 40 mg by mouth daily.        . metFORMIN (GLUCOPHAGE) 500 MG tablet Take 500 mg by mouth daily with breakfast.        . Multiple Vitamin (MULTIVITAMIN) capsule Take 1 capsule by mouth daily.        . niacin (NIASPAN) 500 MG CR tablet Take 500 mg by mouth at bedtime.        . nitroGLYCERIN (NITROSTAT) 0.4 MG SL tablet Place 0.4 mg under the tongue every 5 (five) minutes as needed.        . Saw Palmetto, Serenoa repens, (SAW PALMETTO PO) Take 1 tablet by mouth daily.        . simvastatin (ZOCOR) 40 MG tablet Take 40 mg by mouth at bedtime.           PHYSICAL EXAM: Filed Vitals:   12/18/10 1111  BP: 142/82  Pulse: 59   General:  Well appearing. No resp difficulty HEENT: normal Neck: supple. JVP flat. Carotids 2+ bilaterally; no bruits. No lymphadenopathy or thryomegaly appreciated. Cor: PMI normal. Regular rate & rhythm. No rubs, gallops or murmurs. Lungs: clear Abdomen: soft, nontender, nondistended. No hepatosplenomegaly. No bruits or masses. Good bowel sounds.  Extremities: no cyanosis, clubbing, rash, edema Neuro: alert & orientedx3, cranial nerves grossly intact. Moves all 4 extremities w/o difficulty. Affect pleasant.    ECG: SB 59 No ST-T wave abnormalities.     ASSESSMENT & PLAN:

## 2010-12-20 ENCOUNTER — Encounter (HOSPITAL_COMMUNITY): Payer: Medicare Other

## 2010-12-21 ENCOUNTER — Encounter (HOSPITAL_COMMUNITY): Payer: Medicare Other

## 2010-12-25 ENCOUNTER — Encounter (HOSPITAL_COMMUNITY): Payer: Medicare Other

## 2010-12-27 ENCOUNTER — Encounter (HOSPITAL_COMMUNITY): Payer: Medicare Other

## 2010-12-28 ENCOUNTER — Encounter (HOSPITAL_COMMUNITY): Payer: Medicare Other

## 2011-01-01 ENCOUNTER — Encounter (HOSPITAL_COMMUNITY): Payer: Medicare Other | Attending: Internal Medicine

## 2011-01-01 DIAGNOSIS — R0602 Shortness of breath: Secondary | ICD-10-CM | POA: Insufficient documentation

## 2011-01-01 DIAGNOSIS — Z7982 Long term (current) use of aspirin: Secondary | ICD-10-CM | POA: Insufficient documentation

## 2011-01-01 DIAGNOSIS — I2510 Atherosclerotic heart disease of native coronary artery without angina pectoris: Secondary | ICD-10-CM | POA: Insufficient documentation

## 2011-01-01 DIAGNOSIS — I10 Essential (primary) hypertension: Secondary | ICD-10-CM | POA: Insufficient documentation

## 2011-01-01 DIAGNOSIS — Z5189 Encounter for other specified aftercare: Secondary | ICD-10-CM | POA: Insufficient documentation

## 2011-01-01 DIAGNOSIS — E8881 Metabolic syndrome: Secondary | ICD-10-CM | POA: Insufficient documentation

## 2011-01-01 DIAGNOSIS — E785 Hyperlipidemia, unspecified: Secondary | ICD-10-CM | POA: Insufficient documentation

## 2011-01-01 DIAGNOSIS — E663 Overweight: Secondary | ICD-10-CM | POA: Insufficient documentation

## 2011-01-01 DIAGNOSIS — Z87891 Personal history of nicotine dependence: Secondary | ICD-10-CM | POA: Insufficient documentation

## 2011-01-01 DIAGNOSIS — Z9861 Coronary angioplasty status: Secondary | ICD-10-CM | POA: Insufficient documentation

## 2011-01-01 DIAGNOSIS — I209 Angina pectoris, unspecified: Secondary | ICD-10-CM | POA: Insufficient documentation

## 2011-01-03 ENCOUNTER — Encounter (HOSPITAL_COMMUNITY): Payer: Medicare Other

## 2011-01-04 ENCOUNTER — Encounter (HOSPITAL_COMMUNITY): Payer: Medicare Other

## 2011-01-08 ENCOUNTER — Encounter (HOSPITAL_COMMUNITY): Payer: Medicare Other

## 2011-01-10 ENCOUNTER — Encounter (HOSPITAL_COMMUNITY): Payer: Medicare Other

## 2011-01-11 ENCOUNTER — Encounter (HOSPITAL_COMMUNITY): Payer: Medicare Other

## 2011-01-15 ENCOUNTER — Encounter (HOSPITAL_COMMUNITY): Payer: Medicare Other

## 2011-01-15 NOTE — Assessment & Plan Note (Signed)
[facility] HEALTHCARE                            CARDIOLOGY OFFICE NOTE   Jacob Curry, Jacob Curry                    MRN:          [id number]  DATE:09/14/2007                            DOB:          [birth year]    PRIMARY CARE PHYSICIAN:  Titus Dubin. Alwyn Ren, MD,FACP,FCCP.   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  He also has a history of hypertension, hyperlipidemia, and metabolic  syndrome with elevated fasting glucose.  He returns today for routine  follow-up.   He is doing well.  He continues to go to the maintenance program of  cardiac rehab.  He is also doing his own walking program.  He did have  one episode of shortness of breath when walking up a hill, but this has  not recurred.  He stopped and it went away without any problems.  He has  not had any chest pressure.  Blood pressures have been well controlled  at cardiac rehab.   CURRENT MEDICATIONS:  1. Aspirin 81 mg.  2. Fish Oil.  3. Plavix 75 mg.  4. Simvastatin 40 mg.  5. Metformin 500 mg.  6. Niaspan 500 mg.  7. Carvedilol 18.75 mg b.i.d.  8. Lisinopril 20 mg a day.   PHYSICAL EXAMINATION:  GENERAL:  He is well-appearing in no acute  distress.  He ambulates around the clinic without any respiratory  difficulty.  VITAL SIGNS:  Blood pressure 148/78, heart rate 59, weight 186.  HEENT:  Normal.  NECK:  Supple.  No JVD.  Carotids are 2+ bilaterally without bruits.  No  lymphadenopathy or thyromegaly.  HEART:  PMI is nondisplaced, regular rate and rhythm, no murmurs, rubs,  or gallops.  LUNGS:  Clear.  ABDOMEN:  Soft, nontender, and nondistended.  No hepatosplenomegaly, no  bruits, no masses.  Good bowel sounds.  EXTREMITIES:  Warm with no cyanosis, clubbing, or edema.  No rash.  NEUROLOGY:  Alert and oriented x3.  Cranial nerves II-XII grossly  intact.  Moves all four extremities  without difficulty.  Affect is very  pleasant.   EKG shows sinus bradycardia at a rate of 59.  No significant ST-T wave  abnormalities.   ASSESSMENT:  1. Coronary artery disease.  He is doing good with medical therapy.      Continue aggressive risk factor modification.  2. Hypertension.  Blood pressure is elevated here, but has been doing      well at cardiac rehab.  I suspect he has a component of white coat      hypertension.  3. Hyperlipidemia.  Most recent lipid panel looked excellent.  Total      cholesterol was 121, triglycerides 137, HDL 45, and LDL 49 which      was at goal.   DISPOSITION:  I will see him back for routine follow-up in four months.     Bevelyn Buckles. Bensimhon, MD  Electronically Signed    DRB/MedQ  DD: 09/14/2007  DT: 09/15/2007  Job #: [id number]   cc:   Titus Dubin.  Alwyn Ren, MD,FACP,FCCP

## 2011-01-15 NOTE — Assessment & Plan Note (Signed)
[facility] HEALTHCARE                            CARDIOLOGY OFFICE NOTE   ROBERT, SPERL                    MRN:          [id number]  DATE:12/18/2007                            DOB:          [birth year]    PRIMARY CARE PHYSICIAN:  Dr. Marga Melnick.   INTERVAL HISTORY:  Mr. Schoenherr is a delightful 70 year old male with a  history of multivessel coronary artery disease being treated medically.  He also has a history of hypertension, hyperlipidemia and metabolic  syndrome with elevated fasting glucose. He returns today for routine  follow-up.   He is doing well. He continues to go to the maintenance program for  cardiac rehab and also doing his own walking program.  No chest pain or  shortness of breath.  Blood pressure has been well-controlled at cardiac  rehab.   CURRENT MEDICATIONS:  1. Aspirin 81.  2. Saw palmetto.  3. Fish oil 500 a day.  4. Plavix 75.  5. Simvastatin 40.  6. Metformin 500 a day.  7. Niaspan 500.  8. Carvedilol 18.75 b.i.d.  9. Lisinopril 20 a day.   PHYSICAL EXAM:  Well-appearing in no acute distress. He ambulates around  the clinic without any respiratory difficulty.  Blood pressure is 122/76, heart rate 60.  HEENT:  Normal.  NECK:  Supple.  No JVD.  Carotids 2+ bilaterally without any bruits.  There is no lymphadenopathy or thyromegaly.  CARDIAC:  PMI is nondisplaced.  Regular rate and rhythm.  No murmurs,  rubs or gallops.  LUNGS:  Clear.  ABDOMEN:  Soft, nontender, nondistended, no hepatosplenomegaly, no  bruits, no masses.  Good bowel sounds.  EXTREMITIES:  Warm with no cyanosis, clubbing or edema.  No rash.  NEURO:  Alert and oriented x3.  Cranial nerves II-XII are intact.  Moves  all four extremities without difficulty.  Affect is pleasant.   Total cholesterol is 96, triglycerides 74, HDL is 38, LDL is 43.  LFTs  are normal.   ASSESSMENT/PLAN:  1. Coronary artery disease.  He is doing well.  Continue  medical      therapy and aggressive risk factor modification.  His coronary      disease is really not amenable to percutaneous intervention given      the bifurcation lesion.  2. Hypertension.  Blood pressure is well-controlled.  3. Hyperlipidemia.  Lipids look great.  If LDL remains low could      consider bumping up his Niaspan a little bit.   DISPOSITION:  Return to clinic in 3 months.     Bevelyn Buckles. Bensimhon, MD  Electronically Signed    DRB/MedQ  DD: 12/18/2007  DT: 12/18/2007  Job #: [id number]

## 2011-01-15 NOTE — Assessment & Plan Note (Signed)
[facility] HEALTHCARE                            CARDIOLOGY OFFICE NOTE   [name], [name]                    MRN:          [id number]  DATE:01/20/2007                            DOB:          18-Sep-1940    PRIMARY CARE PHYSICIAN:  Titus Dubin. Alwyn Ren, M.D.   INTERVAL HISTORY:  Mr. Jacob Curry is a delightful 70 year old male with a  history of multivessel coronary artery disease being treated medically  who presents for routine followup.  Medical history is also notable for  hypertension, hyperlipidemia, and metabolic syndrome with elevated  fasting glucose.   He is doing Adult nurse.  He completed cardiac rehab and has now joined  the maintenance program, and is exercising 4 or 5 days a week on his own  without any chest pain or shortness of breath.  He has been taking all  his medications as prescribed and has not had any orthopnea, PND, or  lower extremity edema.  He has been following his blood pressure and  these have been well controlled as well.   CURRENT MEDICATIONS:  1. Aspirin 81 a day.  2. Fish oil.  3. Plavix 75 a day.  4. Simvastatin 40 a day.  5. Coreg 18.75 mg a day.  6. Lisinopril 20 a day.  7. Metformin 500 mg a day.   PHYSICAL EXAMINATION:  GENERAL:  He is well-appearing, no acute  distress.  Ambulates around the clinic without any respiratory  difficulty.  VITAL SIGNS:  Blood pressure is 140/70 manually with a pulse of 54.  Weight is 185, which is down 3 pounds.  HEENT:  Normal.  NECK:  Supple, no JVD.  Carotids are 2+ bilaterally without any bruits.  There is no lymphadenopathy or thyromegaly.  CARDIAC:  He is bradycardic and regular, no murmur.  There is an S4.  LUNGS:  Clear.  ABDOMEN:  Obese, nontender, nondistended.  No hepatosplenomegaly, no  bruits, no masses, good bowel sounds.  EXTREMITIES:  Warm with no cyanosis, clubbing or edema, no rash.  NEUROLOGIC:  He is alert and oriented x3.  Cranial nerves II-XII are  grossly intact.  Moves all four extremities without difficulty.  Affect  is pleasant.   EKG shows sinus bradycardia, rate of 57, with no significant ST-T wave  abnormalities.   ASSESSMENT AND PLAN:  1. Coronary artery disease.  This is quite stable.  He is doing very      well with medical therapy.  Would continue current regimen.  2. Hypertension.  This is better controlled but may not yet be      optimal.  He will continue to keep a blood pressure log for [place] and      we can increase his lisinopril to 40 mg as needed.  I would like to      see his systolic blood pressure under 409.  3. Hyperlipidemia, followed by Dr. Alwyn Ren.  LDL is at goal, although      HDL is somewhat low.  We may consider Niaspan.  4. Glucose intolerance.  He has been recently started on metformin by  Dr. Alwyn Ren.  We will check a hemoglobin A1c at his request.  5. Cardiovascular risk screening.  Given his evidence of coronary      artery disease, I do think it is reasonable to screen him for      carotid artery stenosis as well and we will get a carotid      ultrasound.   DISPOSITION:  Return to clinic in 3 months for routine followup.     Bevelyn Buckles. Bensimhon, MD  Electronically Signed    DRB/MedQ  DD: 01/20/2007  DT: 01/21/2007  Job #: [id number]   cc:   Titus Dubin. Alwyn Ren, MD,FACP,FCCP

## 2011-01-15 NOTE — Assessment & Plan Note (Signed)
[facility] HEALTHCARE                            CARDIOLOGY OFFICE NOTE   MARKHAM, DUMLAO                    MRN:          [id number]  DATE:05/11/2007                            DOB:          1941/05/25    PRIMARY CARE PHYSICIAN:  Jacob Dubin. Alwyn Ren, MD,FACP,FCCP.   INTERVAL HISTORY:  Jacob Curry is a delightful 70 year old male with a  history of multivessel coronary artery disease being treated medically  who presents today for routine followup.   PAST MEDICAL HISTORY:  1. Hypertension.  2. Hyperlipidemia.  3. Metabolic syndrome with elevated fasting glucose.   He comes back today with his wife.  He is doing quite well.  He  continues to go to cardiac rehab in the maintenance program and is also  working out on his own with a walking program.  He denies any chest pain  or shortness of breath.  He has not had any heart failure symptoms.  Blood pressure has been well controlled.   CURRENT MEDICATIONS:  1. Aspirin 81.  2. Fish oil 500 mg a day.  3. Plavix 75 a day.  4. Simvastatin 40.  5. Coreg 18.75 b.i.d.  6. Metformin 500 a day.  7. Lisinopril 20.  8. Niaspan 500.   PHYSICAL EXAMINATION:  GENERAL:  He is well-appearing, no acute  distress, ambulates around the clinic without any respiratory  difficulty.  VITAL SIGNS:  Blood pressure is 142/62, heart rate 65, weight is 180.  HEENT:  Normal.  NECK:  Supple.  No JVD.  Carotids are 2 plus bilaterally.  There is a  question of a soft left bruit.  There is no lymphadenopathy or  thyromegaly.  CARDIAC:  PMI is nondisplaced.  He has a regular rate and rhythm.  No  murmurs, rubs, or gallops.  LUNGS:  Clear.  ABDOMEN:  Soft, nontender, nondistended.  No hepatosplenomegaly.  No  bruits.  No masses.  Good bowel sounds.  EXTREMITIES:  Warm with no cyanosis, clubbing, or edema.  No rash.  NEUROLOGIC:  Alert and oriented x3.  Cranial nerves II-XII are intact.  He moves all 4 extremities without  difficulty.  Affect is very pleasant.   EKG shows normal sinus rhythm at a rate 65, no ST-T wave abnormalities.  Echocardiogram shows an ejection fraction of 55-60% with mild mitral  regurgitation.  Total cholesterol is 108, HDL is 43, LDL is 47.  Liver  functions are normal.  Glucose is 105.   ASSESSMENT/PLAN:  1. Coronary artery disease.  He is doing quite well with medical      therapy.  His disease is really not amenable to percutaneous or      surgical revascularization at this point.  We will continue      aggressive medical therapy.  I have congratulated him on his      exercise program.  2. Hypertension.  Blood pressure is mildly elevated.  Increase Coreg      to 25 mg b.i.d.  3. Hyperlipidemia.  Lipids are at goal.  Continue current therapy.  4. Glucose intolerance.  This is followed by Dr. Alwyn Curry.  5. Questionable left carotid bruit.  We will get a carotid ultrasound      to further evaluate.   DISPOSITION:  I will see him back in 3 months for routine followup.     Bevelyn Buckles. Bensimhon, MD  Electronically Signed    DRB/MedQ  DD: 05/11/2007  DT: 05/12/2007  Job #: [id number]   cc:   Jacob Dubin. Alwyn Ren, MD,FACP,FCCP

## 2011-01-15 NOTE — Assessment & Plan Note (Signed)
[facility] HEALTHCARE                            CARDIOLOGY OFFICE NOTE   TRIGG, DELAROCHA                    MRN:          [id number]  DATE:07/25/2008                            DOB:          05/10/1941    PRIMARY CARE PHYSICIAN:  Titus Dubin. Alwyn Ren, MD,FACP,FCCP   INTERVAL HISTORY:  Jacob Curry is a very pleasant 70 year old male with a  history of multivessel coronary artery disease being treated medically.  He has a history of hypertension, hyperlipidemia, metabolic syndrome.  He returns today for routine followup.   Overall, he is doing great, continues with the maintenance program for  cardiac rehab and also do his own walking program.  He has not had any  chest pain or shortness of breath.  He does have some muscle aches.  His  blood pressure has been very well controlled at  cardiac rehab.  He has  had chronic dry cough, this is not really bothering him, but his wife  does notice it.   CURRENT MEDICATIONS:  Fish oil, saw palmetto, Plavix 75 a day,  carvedilol 18.75 b.i.d., simvastatin 40 a day, metformin 500 daily,  Niaspan 500 daily, and lisinopril 20 mg a day.   PHYSICAL EXAMINATION:  GENERAL:  He is well appearing, no acute  distress.  He ambulates around the clinic without any respiratory  difficulty.  VITAL SIGNS:  Blood pressure initially 122/74, manual recheck 138/82,  heart rate is 54, weight is 189.  HEENT:  Normal.  NECK:  Supple.  No JVD.  Carotids are 2+ bilaterally without any bruits.  There is no lymphadenopathy or thyromegaly.  CARDIAC:  PMI is nondisplaced.  He is bradycardic and regular with no  murmur.  There is a soft S4.  LUNGS:  Clear.  ABDOMEN:  Obese, nontender, nondistended.  No hepatosplenomegaly, no  bruits, no masses.  Good bowel sounds.  EXTREMITIES:  Warm with no cyanosis, clubbing, or edema.  No rash.  NEUROLOGIC:  Alert and oriented x3.  Cranial nerves II through XII are  intact.  Moves all 4 extremities without  difficulty.  Affect is  pleasant.   EKG shows sinus bradycardia at a rate of 54.  No ST-T wave  abnormalities.  Cholesterol panel, total cholesterol 108, triglycerides  123, HDL 42 which is up from 38, and LDL is 41.   ASSESSMENT AND PLAN:  1. Coronary artery disease.  He is doing great.  Continue medical      therapy.  Coronary artery disease was not amenable to      revascularization on a previous catheterization.  2. Hypertension, well controlled.  3. Hyperlipidemia.  Lipids look great.  4. Dry cough.  I suspect this may be related to his lisinopril.  We      did discuss the option of      holding and seeing if it gets better or possibly just switching to      an ARB.  He says it is actually quite tolerable at this point, so      we will not make any changes.     Bevelyn Buckles. Bensimhon, MD  Electronically Signed    DRB/MedQ  DD: 07/25/2008  DT: 07/26/2008  Job #: [id number]   cc:   Titus Dubin. Alwyn Ren, MD,FACP,FCCP

## 2011-01-15 NOTE — Assessment & Plan Note (Signed)
[facility] HEALTHCARE                            CARDIOLOGY OFFICE NOTE   Jacob, Curry                    MRN:          [id number]  DATE:03/24/2008                            DOB:          [birth year]    PRIMARY CARE PHYSICIAN:  Titus Dubin. Alwyn Ren, MD, FACP, [facility]   INTERVAL HISTORY:  Jacob Curry is a delightful 70 year old male with a history  of multivessel coronary artery disease, being treated medically.  He has  a history of hypertension, hyperlipidemia, metabolic syndrome with  elevated fasting glucose.  He returns today for routine followup.   He is doing great.  He continues to go to the maintenance program for  cardiac rehab and also does his own walking program.  He has not had any  chest pain or shortness of breath.  His blood pressure has been very  well controlled at cardiac rehab with systolics in the 110-120 range.   CURRENT MEDICATIONS:  1. Aspirin 81.  2. Saw Palmetto.  3. Fish oil.  4. Plavix 75 a day.  5. Simvastatin 40 a day.  6. Niaspan 500.  7. Metformin 500 a day.  8. Coreg 18.75 mg b.i.d.  9. Lisinopril 20 a day.  10.Flomax 0.4 daily.   PHYSICAL EXAMINATION:  GENERAL:  He is well appearing, in no acute  distress.  He ambulates in the clinic without any respiratory  difficulty.  VITAL SIGNS:  Blood pressure here is 138/90, heart rate 66, and weight  is 187.  HEENT:  Normal.  NECK:  Supple.  No JVD.  Carotids are 2+ bilaterally without any bruits.  There is no lymphadenopathy or thyromegaly.  CARDIAC:  PMI is nondisplaced.  He is bradycardic and regular with no  murmurs, rubs, or gallops.  LUNGS:  Clear.  ABDOMEN:  Obese, nontender, and nondistended.  No hepatosplenomegaly.  No bruits.  No masses.  Good bowel sounds.  EXTREMITIES:  Warm with no cyanosis, clubbing, or edema.  No rash.  NEURO:  Alert and oriented x3.  Cranial nerves II-XII are intact.  Moves  all four extremities without difficulty.  Affect is pleasant.   LABORATORY DATA:  Total cholesterol of 109, triglycerides 94, and HDL is  39.  His LDL is 51.  Hemoglobin A1c is 6.1.   ASSESSMENT AND PLAN:  1. Coronary artery disease.  He is doing great.  Continue medical      therapy with aggressive risk factor modification.  2. Chronic hypertension.  Blood pressure is a little bit elevated      here, but has been normal at cardiac rehabilitation.  I suspect, he      may have a little bit of white coat syndrome.  I will continue him      where he is at.  3. Hyperlipidemia.  Lipids look great.  Continue current therapy.  We      could consider bumping his Niaspan to 1 g a day in the future.   DISPOSITION:  We will see him back in 4 months for a routine followup.     Bevelyn Buckles. Bensimhon, MD  Electronically Signed  DRB/MedQ  DD: 03/24/2008  DT: 03/25/2008  Job #: [id number]

## 2011-01-17 ENCOUNTER — Encounter (HOSPITAL_COMMUNITY): Payer: Medicare Other

## 2011-01-18 ENCOUNTER — Encounter (HOSPITAL_COMMUNITY): Payer: Medicare Other

## 2011-01-18 NOTE — Consult Note (Signed)
NAME:  LAVAL, CAFARO NO.:  [id number]   MEDICAL RECORD NO.:  [id number]            PATIENT TYPE:   LOCATION:                                 FACILITY:   PHYSICIAN:  Bevelyn Buckles. Bensimhon, MD     DATE OF BIRTH:   DATE OF CONSULTATION:  07/08/2006  DATE OF DISCHARGE:                                   CONSULTATION   REFERRING PHYSICIAN:  Dr. Lona Kettle. He is new to [facility].   REASON FOR CONSULTATION:  Abnormal stress test.   HISTORY OF PRESENT ILLNESS:  Jacob Curry is a very pleasant 70 year old male  with a history of metabolic syndrome including dyslipidemia, obesity,  hypertension, and borderline diabetes. He has no known history of heart  disease. He has never had a stress test or cardiac catheterization. He tells  me that he walks a mile to a mile and half with his wife about five days a  week without any chest pain or shortness of breath. Over the summer, he did  have some episodes of exertional dyspnea but related this to the heat. This  has resolved since the cooler weather. He was recently referred for a  screening adenosine Myoview. This showed a significant anterolateral  ischemia, and he was contacted to follow up with [place]. He denies any heart  failure symptoms. He has not had any melena or bright red blood per rectum.  He is not allergic to shellfish.   REVIEW OF SYSTEMS:  Notable for arthritis pain. Otherwise, review of  systems, all systems are negative except for HPI and problem list.   PAST MEDICAL HISTORY:  1. Metabolic syndrome.  2. Obesity.  3. Hyperlipidemia.  4. Hypertension with questionable white-coat component.  5. Borderline diabetes.  6. Previous right ankle fracture requiring multiple surgical repairs.  7. 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   MEDICATIONS:  1. Aspirin 81 a day.  2. Saw palmetto.  3. Fish oil 500 mg a day.   SOCIAL HISTORY:  He lives in [place]. He is a Paediatric nurse. He has a  history of  tobacco use, 1 pack per day x25 years, quit in 1992. No alcoholic beverages.   FAMILY HISTORY:  Mother is alive and well at 59. No coronary disease. Father  died at age 56 with history of coronary disease status post CABG in 39.  Sister is 54 and brother is 93, and they are healthy.   PHYSICAL EXAMINATION:  He is well appearing in no acute distress. He  ambulates around the clinic without any respiratory difficulty. He is a bit  anxious.  Blood pressure is 174/90, heart rate is 73.  HEENT:  Sclerae is anicteric. EOMI. There is no xanthelasma. Mucous  membranes are moist.  NECK:  Supple. No JVD. Carotids are 2+ bilaterally without bruits. There is  no lymphadenopathy or thyromegaly.  CARDIAC:  Regular rate and rhythm, positive S4. No murmur.  LUNGS:  Clear.  ABDOMEN:  Obese with small ventral hernia. There is no hepatosplenomegaly.  No bruits. No masses. Abdominal aortic impulse does not appear widened.  Good  bowel sounds.  EXTREMITIES:  Warm with no clubbing, cyanosis, or edema. He has some mild  deformity of the right ankle. DT pulses are 1+ bilaterally. Femoral pulses  are 2+ bilaterally without bruits. There is rashes.  NEUROLOGICAL:  He is alert and oriented x3. Cranial nerves II-XII are  grossly intact. He moves all four extremities without any difficulty. Affect  is bright.   EKG shows normal sinus rhythm at a rate of 73.   Adenosine Myoview shows an EF of 49% with significant anterolateral  ischemia.   ASSESSMENT AND PLAN:  Mr. Pierson Vantol is very concerning for  significant underlying coronary artery disease, though he remains  asymptomatic. We have discussed the risks and benefits of cardiac  catheterization with him and his wife at length, and they agreed to proceed.  We will perform this in the outpatient laboratory tomorrow. I told him to  continue his aspirin. We will also start him on Coreg 3.125 for his high  blood pressure as well as  simvastatin 20 mg a day. We will not start Plavix  at this time until we know his anatomy clearly. We have also given him a  prescription for p.r.n. nitroglycerin, and he is advised to call 911 should  he develop significant chest discomfort.      Bevelyn Buckles. Bensimhon, MD  Electronically Signed     DRB/MEDQ  D:  07/08/2006  T:  07/08/2006  Job:  [id number]

## 2011-01-18 NOTE — Cardiovascular Report (Signed)
NAME:  Jacob Curry, Jacob Curry NO.:  [id number]   MEDICAL RECORD NO.:  [id number]          PATIENT TYPE:  OIB   LOCATION:  1963                         FACILITY:  [facility]   PHYSICIAN:  Bevelyn Buckles. Bensimhon, MDDATE OF BIRTH:  [birth year]   DATE OF PROCEDURE:  DATE OF DISCHARGE:                              CARDIAC CATHETERIZATION   PRIMARY CARE PHYSICIAN:  Dvonte Gatliff. Alwyn Ren, MD,FACP,FCCP.   CARDIOLOGIST:  Bevelyn Buckles. Bensimhon, M.D.   PATIENT IDENTIFICATION:  Mr. Coffelt is a very pleasant 70 year old male  with a history of hypertension, hyperlipidemia and borderline diabetes.  He  was referred for routine screening Cardiolite.  This showed an EF of about  50% with severe inferolateral ischemia.  In talking with him, he walks about  a mile to a mile and a half a day without any symptoms.  He says over the  summer in the head, he did have some shortness of breath and maybe some  chest tightness but has not had any in the cooler weather.  He is referred  for catheterization in the outpatient cardiovascular lab.   PROCEDURES PERFORMED:  1. Selective coronary angiography.  2. Left heart cath.  3. Left ventriculogram.  4. Abdominal aortogram.   DESCRIPTION OF PROCEDURE:  The risks and benefits of catheterization were  explained.  Consent was signed and placed in the chart.  A 4-French sheath  was placed in the right femoral artery using modified Seldinger technique.  Standard catheters including JL-4, 3-D RCA, and angled pigtail were used for  procedure.  All catheter exchanges were made over a wire.  There were no  apparent complications.   1. Central aortic pressure is 163/85 with a mean 115.  2. LV pressure is 164/7 with an EDP of 18.  3. There is no aortic stenosis.   1. The left main was heavily calcified with a 40% stenosis throughout its      entire course and extending into the ostial LAD.  2. LAD was a long vessel wrapping the apex.  There was a significant  calcification throughout the proximal and mid portion.  There is a 40%      stenosis at the ostium contiguous with the left main lesion.  There is      a 40% in the proximal section of the takeoff of a large diagonal.      There was a single large branching first diagonal with a 40% tubular      lesion proximally and a 90% lesion in the midsection.  The distal LAD      was free of critical disease.  3. The left circumflex was a large system, also with heavy calcification.      It gave off a moderate-sized OM-1, a small branching OM-2,  and then      was totally occluded in the midsection.  There is evidence of left-to-      left collaterals filling in two large posterolaterals.  There is a 40%      lesion in the ostial OM-1.  4. The right coronary artery was a moderate-sized codominant vessel.  There was heavy calcification proximally.  In the midsection there was      an 80-90% lesion at the takeoff of the RV branch.  There was a small      PDA and a small PL.  The RV branch was a moderate-sized branching      vessel with a 90% lesion in its midsection.   LEFT VENTRICULOGRAM:  1. In the RAO position, showed an EF of 50-55% with inferior basilar      hypokinesis.  2. In the lateral position showed an EF of 55% with posterolateral      akinesis.   ABDOMINAL AORTOGRAM:  Showed patent renal arteries bilaterally with moderate  abdominal iliac plaquing with no evidence of aneurysm or high-grade  stenosis.   ASSESSMENT:  1. Severe three-vessel disease as described above with chronically      occluded left circumflex.  2. Normal ejection fraction with inferior posterolateral wall motion      abnormalities.  3. Heavy vascular calcifications.   PLAN/DISCUSSION:  Mr. Topel has severe multivessel coronary artery disease  but given that there is not a critical lesion in the LAD, I think coronary  bypass grafting is a less appealing option given the risk of his LIMA  becoming atretic.   I will review this with our interventional colleagues  regarding the possibility of bypass surgery versus multivessel stenting  versus medical therapy.      Bevelyn Buckles. Bensimhon, MD  Electronically Signed     DRB/MEDQ  D:  07/09/2006  T:  07/09/2006  Job:  [id number]   cc:   Titus Dubin. Alwyn Ren, MD,FACP,FCCP

## 2011-01-18 NOTE — Procedures (Signed)
[facility]  Patient:    Jacob Curry, Jacob Curry              MRN: [id number] Proc. Date: 04/13/01 Adm. Date:  [id number] Attending:  Mervin Hack CC:         Titus Dubin. Alwyn Ren, M.D. [facility]   Procedure Report  PROCEDURE:  Colonoscopy.  INDICATIONS:  This 70 year old white male has a positive family history of colon cancer in a paternal uncle. He himself had an episode of rectal bleeding. His bowel habits are regular. His Hemoccults were negative. Recent exam by Dr. Alwyn Ren was normal. He is undergoing his first colonoscopy for neoplastic screening.  ENDOSCOPE:  Olympus single-channel video endoscope.  SEDATION:  Versed 5 mg IV, Demerol 50 mg IV.  FINDINGS:  Olympus single-channel video endoscope passed routinely through rectum to the sigmoid colon complex. Patient was monitored by pulse oximetry. His oxygen saturations were normal.  There were small hemorrhoids in the anal canal which did not prolapse. There was no evidence of bleeding from the hemorrhoids. Rectal ampulla was normal. There were a few scattered diverticula of the sigmoid colon. ______ were normal. Mucosa was unremarkable. Colonoscope was passed easily through the descending colon, splenic flexure, transverse colon, hepatic flexure, to the ascending colon. There was some difficulty in advancing the scope around the hepatic flexure but cecal pouch was reached without difficulty and showed normal cecal pouch and ileocecal valve. The colonoscope was then retracted, the colon decompressed. The patient tolerated the procedure well.  IMPRESSION: 1. Minimal diverticulosis of the left colon. 2. No evidence of polyps. 3. Small hemorrhoids.  PLAN: 1. High fiber diet. 2. Hemoccults on a yearly basis. 3. Follow up with Dr. Alwyn Ren. Suggest repeat colonoscopy in 10 years. DD:  04/13/01 TD:  04/13/01 Job: [id number]

## 2011-01-22 ENCOUNTER — Encounter (HOSPITAL_COMMUNITY): Payer: Medicare Other

## 2011-01-23 ENCOUNTER — Other Ambulatory Visit: Payer: Self-pay | Admitting: Internal Medicine

## 2011-01-24 ENCOUNTER — Telehealth: Payer: Self-pay | Admitting: Internal Medicine

## 2011-01-24 ENCOUNTER — Encounter (HOSPITAL_COMMUNITY): Payer: Medicare Other

## 2011-01-24 NOTE — Telephone Encounter (Signed)
Pt wanted to make sure it was ok to take Clopidogrel, that is what he was given at pharmacy advised ok

## 2011-01-24 NOTE — Telephone Encounter (Signed)
Pt calling re question on med

## 2011-01-25 ENCOUNTER — Encounter (HOSPITAL_COMMUNITY): Payer: Medicare Other

## 2011-01-29 ENCOUNTER — Encounter (HOSPITAL_COMMUNITY): Payer: Medicare Other

## 2011-01-31 ENCOUNTER — Encounter (HOSPITAL_COMMUNITY): Payer: Medicare Other

## 2011-02-01 ENCOUNTER — Encounter (HOSPITAL_COMMUNITY): Payer: Medicare Other | Attending: Internal Medicine

## 2011-02-01 DIAGNOSIS — Z87891 Personal history of nicotine dependence: Secondary | ICD-10-CM | POA: Insufficient documentation

## 2011-02-01 DIAGNOSIS — E785 Hyperlipidemia, unspecified: Secondary | ICD-10-CM | POA: Insufficient documentation

## 2011-02-01 DIAGNOSIS — I10 Essential (primary) hypertension: Secondary | ICD-10-CM | POA: Insufficient documentation

## 2011-02-01 DIAGNOSIS — E663 Overweight: Secondary | ICD-10-CM | POA: Insufficient documentation

## 2011-02-01 DIAGNOSIS — Z5189 Encounter for other specified aftercare: Secondary | ICD-10-CM | POA: Insufficient documentation

## 2011-02-01 DIAGNOSIS — E8881 Metabolic syndrome: Secondary | ICD-10-CM | POA: Insufficient documentation

## 2011-02-01 DIAGNOSIS — Z9861 Coronary angioplasty status: Secondary | ICD-10-CM | POA: Insufficient documentation

## 2011-02-01 DIAGNOSIS — R0602 Shortness of breath: Secondary | ICD-10-CM | POA: Insufficient documentation

## 2011-02-01 DIAGNOSIS — I209 Angina pectoris, unspecified: Secondary | ICD-10-CM | POA: Insufficient documentation

## 2011-02-01 DIAGNOSIS — I2510 Atherosclerotic heart disease of native coronary artery without angina pectoris: Secondary | ICD-10-CM | POA: Insufficient documentation

## 2011-02-01 DIAGNOSIS — Z7982 Long term (current) use of aspirin: Secondary | ICD-10-CM | POA: Insufficient documentation

## 2011-02-05 ENCOUNTER — Encounter (HOSPITAL_COMMUNITY): Payer: Medicare Other

## 2011-02-07 ENCOUNTER — Encounter (HOSPITAL_COMMUNITY): Payer: Medicare Other

## 2011-02-08 ENCOUNTER — Encounter (HOSPITAL_COMMUNITY): Payer: Medicare Other

## 2011-02-12 ENCOUNTER — Encounter (HOSPITAL_COMMUNITY): Payer: Medicare Other

## 2011-02-14 ENCOUNTER — Encounter (HOSPITAL_COMMUNITY): Payer: Medicare Other

## 2011-02-15 ENCOUNTER — Encounter (HOSPITAL_COMMUNITY): Payer: Medicare Other

## 2011-02-19 ENCOUNTER — Encounter (HOSPITAL_COMMUNITY): Payer: Medicare Other

## 2011-02-21 ENCOUNTER — Encounter (HOSPITAL_COMMUNITY): Payer: Medicare Other

## 2011-02-22 ENCOUNTER — Encounter (HOSPITAL_COMMUNITY): Payer: Medicare Other

## 2011-02-26 ENCOUNTER — Encounter (HOSPITAL_COMMUNITY): Payer: Medicare Other

## 2011-02-28 ENCOUNTER — Encounter (HOSPITAL_COMMUNITY): Payer: Medicare Other

## 2011-03-01 ENCOUNTER — Encounter (HOSPITAL_COMMUNITY): Payer: Medicare Other

## 2011-03-04 ENCOUNTER — Encounter (HOSPITAL_COMMUNITY): Payer: Medicare Other | Attending: Internal Medicine

## 2011-03-04 DIAGNOSIS — I2510 Atherosclerotic heart disease of native coronary artery without angina pectoris: Secondary | ICD-10-CM | POA: Insufficient documentation

## 2011-03-04 DIAGNOSIS — I10 Essential (primary) hypertension: Secondary | ICD-10-CM | POA: Insufficient documentation

## 2011-03-04 DIAGNOSIS — E663 Overweight: Secondary | ICD-10-CM | POA: Insufficient documentation

## 2011-03-04 DIAGNOSIS — E8881 Metabolic syndrome: Secondary | ICD-10-CM | POA: Insufficient documentation

## 2011-03-04 DIAGNOSIS — I209 Angina pectoris, unspecified: Secondary | ICD-10-CM | POA: Insufficient documentation

## 2011-03-04 DIAGNOSIS — R0602 Shortness of breath: Secondary | ICD-10-CM | POA: Insufficient documentation

## 2011-03-04 DIAGNOSIS — Z87891 Personal history of nicotine dependence: Secondary | ICD-10-CM | POA: Insufficient documentation

## 2011-03-04 DIAGNOSIS — Z5189 Encounter for other specified aftercare: Secondary | ICD-10-CM | POA: Insufficient documentation

## 2011-03-04 DIAGNOSIS — E785 Hyperlipidemia, unspecified: Secondary | ICD-10-CM | POA: Insufficient documentation

## 2011-03-04 DIAGNOSIS — Z9861 Coronary angioplasty status: Secondary | ICD-10-CM | POA: Insufficient documentation

## 2011-03-04 DIAGNOSIS — Z7982 Long term (current) use of aspirin: Secondary | ICD-10-CM | POA: Insufficient documentation

## 2011-03-05 ENCOUNTER — Encounter (HOSPITAL_COMMUNITY): Payer: Self-pay | Attending: Internal Medicine

## 2011-03-05 DIAGNOSIS — I209 Angina pectoris, unspecified: Secondary | ICD-10-CM | POA: Insufficient documentation

## 2011-03-05 DIAGNOSIS — Z7982 Long term (current) use of aspirin: Secondary | ICD-10-CM | POA: Insufficient documentation

## 2011-03-05 DIAGNOSIS — Z87891 Personal history of nicotine dependence: Secondary | ICD-10-CM | POA: Insufficient documentation

## 2011-03-05 DIAGNOSIS — Z9861 Coronary angioplasty status: Secondary | ICD-10-CM | POA: Insufficient documentation

## 2011-03-05 DIAGNOSIS — I10 Essential (primary) hypertension: Secondary | ICD-10-CM | POA: Insufficient documentation

## 2011-03-05 DIAGNOSIS — E8881 Metabolic syndrome: Secondary | ICD-10-CM | POA: Insufficient documentation

## 2011-03-05 DIAGNOSIS — E663 Overweight: Secondary | ICD-10-CM | POA: Insufficient documentation

## 2011-03-05 DIAGNOSIS — E785 Hyperlipidemia, unspecified: Secondary | ICD-10-CM | POA: Insufficient documentation

## 2011-03-05 DIAGNOSIS — Z5189 Encounter for other specified aftercare: Secondary | ICD-10-CM | POA: Insufficient documentation

## 2011-03-05 DIAGNOSIS — R0602 Shortness of breath: Secondary | ICD-10-CM | POA: Insufficient documentation

## 2011-03-05 DIAGNOSIS — I2510 Atherosclerotic heart disease of native coronary artery without angina pectoris: Secondary | ICD-10-CM | POA: Insufficient documentation

## 2011-03-07 ENCOUNTER — Encounter (HOSPITAL_COMMUNITY): Payer: Self-pay

## 2011-03-08 ENCOUNTER — Encounter (HOSPITAL_COMMUNITY): Payer: Self-pay

## 2011-03-11 ENCOUNTER — Other Ambulatory Visit: Payer: Self-pay | Admitting: Internal Medicine

## 2011-03-12 ENCOUNTER — Encounter (HOSPITAL_COMMUNITY): Payer: Self-pay

## 2011-03-14 ENCOUNTER — Encounter (HOSPITAL_COMMUNITY): Payer: Self-pay

## 2011-03-15 ENCOUNTER — Encounter (HOSPITAL_COMMUNITY): Payer: Self-pay

## 2011-03-19 ENCOUNTER — Encounter (HOSPITAL_COMMUNITY): Payer: Self-pay

## 2011-03-21 ENCOUNTER — Encounter (HOSPITAL_COMMUNITY): Payer: Self-pay

## 2011-03-22 ENCOUNTER — Encounter (HOSPITAL_COMMUNITY): Payer: Self-pay

## 2011-03-26 ENCOUNTER — Encounter (HOSPITAL_COMMUNITY): Payer: Self-pay

## 2011-03-28 ENCOUNTER — Encounter (HOSPITAL_COMMUNITY): Payer: Self-pay

## 2011-03-29 ENCOUNTER — Encounter (HOSPITAL_COMMUNITY): Payer: Self-pay

## 2011-04-02 ENCOUNTER — Encounter (HOSPITAL_COMMUNITY): Payer: Self-pay

## 2011-04-04 ENCOUNTER — Encounter (HOSPITAL_COMMUNITY): Payer: Self-pay | Attending: Internal Medicine

## 2011-04-04 DIAGNOSIS — Z9861 Coronary angioplasty status: Secondary | ICD-10-CM | POA: Insufficient documentation

## 2011-04-04 DIAGNOSIS — I209 Angina pectoris, unspecified: Secondary | ICD-10-CM | POA: Insufficient documentation

## 2011-04-04 DIAGNOSIS — E785 Hyperlipidemia, unspecified: Secondary | ICD-10-CM | POA: Insufficient documentation

## 2011-04-04 DIAGNOSIS — I10 Essential (primary) hypertension: Secondary | ICD-10-CM | POA: Insufficient documentation

## 2011-04-04 DIAGNOSIS — Z7982 Long term (current) use of aspirin: Secondary | ICD-10-CM | POA: Insufficient documentation

## 2011-04-04 DIAGNOSIS — Z5189 Encounter for other specified aftercare: Secondary | ICD-10-CM | POA: Insufficient documentation

## 2011-04-04 DIAGNOSIS — E663 Overweight: Secondary | ICD-10-CM | POA: Insufficient documentation

## 2011-04-04 DIAGNOSIS — Z87891 Personal history of nicotine dependence: Secondary | ICD-10-CM | POA: Insufficient documentation

## 2011-04-04 DIAGNOSIS — E8881 Metabolic syndrome: Secondary | ICD-10-CM | POA: Insufficient documentation

## 2011-04-04 DIAGNOSIS — R0602 Shortness of breath: Secondary | ICD-10-CM | POA: Insufficient documentation

## 2011-04-04 DIAGNOSIS — I2510 Atherosclerotic heart disease of native coronary artery without angina pectoris: Secondary | ICD-10-CM | POA: Insufficient documentation

## 2011-04-05 ENCOUNTER — Encounter (HOSPITAL_COMMUNITY): Payer: Self-pay

## 2011-04-09 ENCOUNTER — Encounter (HOSPITAL_COMMUNITY): Payer: Self-pay

## 2011-04-11 ENCOUNTER — Encounter (HOSPITAL_COMMUNITY): Payer: Self-pay

## 2011-04-12 ENCOUNTER — Encounter (HOSPITAL_COMMUNITY): Payer: Self-pay

## 2011-04-16 ENCOUNTER — Encounter (HOSPITAL_COMMUNITY): Payer: Self-pay

## 2011-04-18 ENCOUNTER — Encounter (HOSPITAL_COMMUNITY): Payer: Self-pay

## 2011-04-19 ENCOUNTER — Encounter (HOSPITAL_COMMUNITY): Payer: Self-pay

## 2011-04-23 ENCOUNTER — Encounter (HOSPITAL_COMMUNITY): Payer: Self-pay

## 2011-04-25 ENCOUNTER — Encounter (HOSPITAL_COMMUNITY): Payer: Self-pay

## 2011-04-26 ENCOUNTER — Encounter (HOSPITAL_COMMUNITY): Payer: Self-pay

## 2011-04-30 ENCOUNTER — Encounter (HOSPITAL_COMMUNITY): Payer: Self-pay

## 2011-05-02 ENCOUNTER — Encounter (HOSPITAL_COMMUNITY): Payer: Self-pay

## 2011-05-03 ENCOUNTER — Encounter (HOSPITAL_COMMUNITY): Payer: Self-pay

## 2011-05-07 ENCOUNTER — Encounter (HOSPITAL_COMMUNITY): Payer: Self-pay | Attending: Internal Medicine

## 2011-05-07 DIAGNOSIS — Z5189 Encounter for other specified aftercare: Secondary | ICD-10-CM | POA: Insufficient documentation

## 2011-05-07 DIAGNOSIS — E8881 Metabolic syndrome: Secondary | ICD-10-CM | POA: Insufficient documentation

## 2011-05-07 DIAGNOSIS — Z9861 Coronary angioplasty status: Secondary | ICD-10-CM | POA: Insufficient documentation

## 2011-05-07 DIAGNOSIS — I10 Essential (primary) hypertension: Secondary | ICD-10-CM | POA: Insufficient documentation

## 2011-05-07 DIAGNOSIS — E663 Overweight: Secondary | ICD-10-CM | POA: Insufficient documentation

## 2011-05-07 DIAGNOSIS — E785 Hyperlipidemia, unspecified: Secondary | ICD-10-CM | POA: Insufficient documentation

## 2011-05-07 DIAGNOSIS — I209 Angina pectoris, unspecified: Secondary | ICD-10-CM | POA: Insufficient documentation

## 2011-05-07 DIAGNOSIS — R0602 Shortness of breath: Secondary | ICD-10-CM | POA: Insufficient documentation

## 2011-05-07 DIAGNOSIS — Z87891 Personal history of nicotine dependence: Secondary | ICD-10-CM | POA: Insufficient documentation

## 2011-05-07 DIAGNOSIS — Z7982 Long term (current) use of aspirin: Secondary | ICD-10-CM | POA: Insufficient documentation

## 2011-05-07 DIAGNOSIS — I2510 Atherosclerotic heart disease of native coronary artery without angina pectoris: Secondary | ICD-10-CM | POA: Insufficient documentation

## 2011-05-09 ENCOUNTER — Encounter (HOSPITAL_COMMUNITY): Payer: Self-pay

## 2011-05-10 ENCOUNTER — Encounter (HOSPITAL_COMMUNITY): Payer: Self-pay

## 2011-05-13 ENCOUNTER — Encounter: Payer: Self-pay | Admitting: Internal Medicine

## 2011-05-14 ENCOUNTER — Encounter (HOSPITAL_COMMUNITY): Payer: Self-pay

## 2011-05-16 ENCOUNTER — Encounter (HOSPITAL_COMMUNITY): Payer: Self-pay

## 2011-05-17 ENCOUNTER — Encounter (HOSPITAL_COMMUNITY): Payer: Self-pay

## 2011-05-21 ENCOUNTER — Encounter (HOSPITAL_COMMUNITY): Payer: Self-pay

## 2011-05-23 ENCOUNTER — Encounter (HOSPITAL_COMMUNITY): Payer: Self-pay

## 2011-05-24 ENCOUNTER — Encounter (HOSPITAL_COMMUNITY): Payer: Self-pay

## 2011-05-28 ENCOUNTER — Encounter (HOSPITAL_COMMUNITY): Payer: Self-pay

## 2011-05-30 ENCOUNTER — Encounter (HOSPITAL_COMMUNITY): Payer: Self-pay

## 2011-05-31 ENCOUNTER — Encounter (HOSPITAL_COMMUNITY): Payer: Self-pay

## 2011-06-04 ENCOUNTER — Encounter (HOSPITAL_COMMUNITY): Payer: Self-pay | Attending: Internal Medicine

## 2011-06-04 DIAGNOSIS — Z7982 Long term (current) use of aspirin: Secondary | ICD-10-CM | POA: Insufficient documentation

## 2011-06-04 DIAGNOSIS — E8881 Metabolic syndrome: Secondary | ICD-10-CM | POA: Insufficient documentation

## 2011-06-04 DIAGNOSIS — Z87891 Personal history of nicotine dependence: Secondary | ICD-10-CM | POA: Insufficient documentation

## 2011-06-04 DIAGNOSIS — I209 Angina pectoris, unspecified: Secondary | ICD-10-CM | POA: Insufficient documentation

## 2011-06-04 DIAGNOSIS — E785 Hyperlipidemia, unspecified: Secondary | ICD-10-CM | POA: Insufficient documentation

## 2011-06-04 DIAGNOSIS — R0602 Shortness of breath: Secondary | ICD-10-CM | POA: Insufficient documentation

## 2011-06-04 DIAGNOSIS — Z5189 Encounter for other specified aftercare: Secondary | ICD-10-CM | POA: Insufficient documentation

## 2011-06-04 DIAGNOSIS — I10 Essential (primary) hypertension: Secondary | ICD-10-CM | POA: Insufficient documentation

## 2011-06-04 DIAGNOSIS — I2510 Atherosclerotic heart disease of native coronary artery without angina pectoris: Secondary | ICD-10-CM | POA: Insufficient documentation

## 2011-06-04 DIAGNOSIS — E663 Overweight: Secondary | ICD-10-CM | POA: Insufficient documentation

## 2011-06-04 DIAGNOSIS — Z9861 Coronary angioplasty status: Secondary | ICD-10-CM | POA: Insufficient documentation

## 2011-06-05 ENCOUNTER — Encounter (HOSPITAL_COMMUNITY): Payer: Self-pay

## 2011-06-06 ENCOUNTER — Encounter (HOSPITAL_COMMUNITY): Payer: Self-pay

## 2011-06-07 ENCOUNTER — Encounter (HOSPITAL_COMMUNITY): Payer: Self-pay

## 2011-06-11 ENCOUNTER — Encounter (HOSPITAL_COMMUNITY): Payer: Self-pay

## 2011-06-13 ENCOUNTER — Encounter (HOSPITAL_COMMUNITY): Payer: Self-pay

## 2011-06-14 ENCOUNTER — Encounter (HOSPITAL_COMMUNITY): Payer: Self-pay

## 2011-06-18 ENCOUNTER — Encounter (HOSPITAL_COMMUNITY): Payer: Self-pay

## 2011-06-20 ENCOUNTER — Encounter (HOSPITAL_COMMUNITY): Payer: Self-pay

## 2011-06-21 ENCOUNTER — Encounter (HOSPITAL_COMMUNITY): Payer: Self-pay

## 2011-06-24 ENCOUNTER — Other Ambulatory Visit: Payer: Self-pay | Admitting: Internal Medicine

## 2011-06-25 ENCOUNTER — Encounter (HOSPITAL_COMMUNITY): Payer: Self-pay

## 2011-06-26 ENCOUNTER — Other Ambulatory Visit: Payer: Self-pay | Admitting: Internal Medicine

## 2011-06-27 ENCOUNTER — Encounter (HOSPITAL_COMMUNITY): Payer: Self-pay

## 2011-06-28 ENCOUNTER — Encounter (HOSPITAL_COMMUNITY): Payer: Self-pay

## 2011-07-02 ENCOUNTER — Encounter (HOSPITAL_COMMUNITY): Payer: Self-pay

## 2011-07-04 ENCOUNTER — Encounter (HOSPITAL_COMMUNITY): Payer: Self-pay

## 2011-07-04 DIAGNOSIS — Z9861 Coronary angioplasty status: Secondary | ICD-10-CM | POA: Insufficient documentation

## 2011-07-04 DIAGNOSIS — Z87891 Personal history of nicotine dependence: Secondary | ICD-10-CM | POA: Insufficient documentation

## 2011-07-04 DIAGNOSIS — R0602 Shortness of breath: Secondary | ICD-10-CM | POA: Insufficient documentation

## 2011-07-04 DIAGNOSIS — I10 Essential (primary) hypertension: Secondary | ICD-10-CM | POA: Insufficient documentation

## 2011-07-04 DIAGNOSIS — Z5189 Encounter for other specified aftercare: Secondary | ICD-10-CM | POA: Insufficient documentation

## 2011-07-04 DIAGNOSIS — E8881 Metabolic syndrome: Secondary | ICD-10-CM | POA: Insufficient documentation

## 2011-07-04 DIAGNOSIS — I209 Angina pectoris, unspecified: Secondary | ICD-10-CM | POA: Insufficient documentation

## 2011-07-04 DIAGNOSIS — Z7982 Long term (current) use of aspirin: Secondary | ICD-10-CM | POA: Insufficient documentation

## 2011-07-04 DIAGNOSIS — I2510 Atherosclerotic heart disease of native coronary artery without angina pectoris: Secondary | ICD-10-CM | POA: Insufficient documentation

## 2011-07-04 DIAGNOSIS — E785 Hyperlipidemia, unspecified: Secondary | ICD-10-CM | POA: Insufficient documentation

## 2011-07-04 DIAGNOSIS — E663 Overweight: Secondary | ICD-10-CM | POA: Insufficient documentation

## 2011-07-05 ENCOUNTER — Encounter (HOSPITAL_COMMUNITY): Payer: Self-pay

## 2011-07-09 ENCOUNTER — Encounter (HOSPITAL_COMMUNITY): Payer: Self-pay

## 2011-07-11 ENCOUNTER — Encounter (HOSPITAL_COMMUNITY): Payer: Self-pay

## 2011-07-12 ENCOUNTER — Encounter (HOSPITAL_COMMUNITY): Payer: Self-pay

## 2011-07-16 ENCOUNTER — Encounter (HOSPITAL_COMMUNITY): Payer: Self-pay

## 2011-07-18 ENCOUNTER — Encounter (HOSPITAL_COMMUNITY): Payer: Self-pay

## 2011-07-19 ENCOUNTER — Encounter (HOSPITAL_COMMUNITY)
Admission: RE | Admit: 2011-07-19 | Discharge: 2011-07-19 | Disposition: A | Discharge status: Home / Self Care | Payer: Self-pay | Source: Ambulatory Visit | Attending: Internal Medicine | Admitting: Internal Medicine

## 2011-07-23 ENCOUNTER — Encounter (HOSPITAL_COMMUNITY)
Admission: RE | Admit: 2011-07-23 | Discharge: 2011-07-23 | Disposition: A | Discharge status: Home / Self Care | Payer: Self-pay | Source: Ambulatory Visit | Attending: Internal Medicine | Admitting: Internal Medicine

## 2011-07-24 ENCOUNTER — Encounter (HOSPITAL_COMMUNITY)
Admission: RE | Admit: 2011-07-24 | Discharge: 2011-07-24 | Disposition: A | Discharge status: Home / Self Care | Payer: Self-pay | Source: Ambulatory Visit | Attending: Internal Medicine | Admitting: Internal Medicine

## 2011-07-25 ENCOUNTER — Encounter (HOSPITAL_COMMUNITY): Payer: Self-pay

## 2011-07-26 ENCOUNTER — Encounter (HOSPITAL_COMMUNITY): Payer: Self-pay

## 2011-07-30 ENCOUNTER — Encounter (HOSPITAL_COMMUNITY): Payer: Self-pay

## 2011-08-01 ENCOUNTER — Encounter (HOSPITAL_COMMUNITY): Payer: Self-pay

## 2011-08-02 ENCOUNTER — Encounter (HOSPITAL_COMMUNITY): Payer: Self-pay

## 2011-08-06 ENCOUNTER — Encounter (HOSPITAL_COMMUNITY): Payer: Self-pay

## 2011-08-06 DIAGNOSIS — I10 Essential (primary) hypertension: Secondary | ICD-10-CM | POA: Insufficient documentation

## 2011-08-06 DIAGNOSIS — Z9861 Coronary angioplasty status: Secondary | ICD-10-CM | POA: Insufficient documentation

## 2011-08-06 DIAGNOSIS — Z7982 Long term (current) use of aspirin: Secondary | ICD-10-CM | POA: Insufficient documentation

## 2011-08-06 DIAGNOSIS — E663 Overweight: Secondary | ICD-10-CM | POA: Insufficient documentation

## 2011-08-06 DIAGNOSIS — E8881 Metabolic syndrome: Secondary | ICD-10-CM | POA: Insufficient documentation

## 2011-08-06 DIAGNOSIS — Z5189 Encounter for other specified aftercare: Secondary | ICD-10-CM | POA: Insufficient documentation

## 2011-08-06 DIAGNOSIS — R0602 Shortness of breath: Secondary | ICD-10-CM | POA: Insufficient documentation

## 2011-08-06 DIAGNOSIS — E785 Hyperlipidemia, unspecified: Secondary | ICD-10-CM | POA: Insufficient documentation

## 2011-08-06 DIAGNOSIS — I2510 Atherosclerotic heart disease of native coronary artery without angina pectoris: Secondary | ICD-10-CM | POA: Insufficient documentation

## 2011-08-06 DIAGNOSIS — Z87891 Personal history of nicotine dependence: Secondary | ICD-10-CM | POA: Insufficient documentation

## 2011-08-06 DIAGNOSIS — I209 Angina pectoris, unspecified: Secondary | ICD-10-CM | POA: Insufficient documentation

## 2011-08-08 ENCOUNTER — Encounter (HOSPITAL_COMMUNITY)
Admission: RE | Admit: 2011-08-08 | Discharge: 2011-08-08 | Disposition: A | Discharge status: Home / Self Care | Payer: Self-pay | Source: Ambulatory Visit | Attending: Internal Medicine | Admitting: Internal Medicine

## 2011-08-09 ENCOUNTER — Encounter (HOSPITAL_COMMUNITY)
Admission: RE | Admit: 2011-08-09 | Discharge: 2011-08-09 | Disposition: A | Discharge status: Home / Self Care | Payer: Self-pay | Source: Ambulatory Visit | Attending: Internal Medicine | Admitting: Internal Medicine

## 2011-08-13 ENCOUNTER — Encounter (HOSPITAL_COMMUNITY): Payer: Self-pay

## 2011-08-15 ENCOUNTER — Other Ambulatory Visit: Payer: Medicare Other | Admitting: *Deleted

## 2011-08-15 ENCOUNTER — Encounter (HOSPITAL_COMMUNITY): Payer: Self-pay

## 2011-08-16 ENCOUNTER — Encounter (HOSPITAL_COMMUNITY): Payer: Self-pay

## 2011-08-19 ENCOUNTER — Ambulatory Visit (INDEPENDENT_AMBULATORY_CARE_PROVIDER_SITE_OTHER): Payer: Medicare Other | Admitting: *Deleted

## 2011-08-19 DIAGNOSIS — E785 Hyperlipidemia, unspecified: Secondary | ICD-10-CM

## 2011-08-19 DIAGNOSIS — E119 Type 2 diabetes mellitus without complications: Secondary | ICD-10-CM

## 2011-08-19 DIAGNOSIS — I10 Essential (primary) hypertension: Secondary | ICD-10-CM

## 2011-08-19 DIAGNOSIS — I2510 Atherosclerotic heart disease of native coronary artery without angina pectoris: Secondary | ICD-10-CM

## 2011-08-19 LAB — LIPID PANEL
Cholesterol: 117 mg/dL (ref 0–200)
HDL: 52.5 mg/dL (ref >39.00)
LDL Cholesterol: 35 mg/dL (ref 0–99)
Total CHOL/HDL Ratio: 2
Triglycerides: 146 mg/dL (ref 0.0–149.0)
VLDL: 29.2 mg/dL (ref 0.0–40.0)

## 2011-08-19 LAB — HEPATIC FUNCTION PANEL
ALT: 25 U/L (ref 0–53)
AST: 24 U/L (ref 0–37)
Albumin: 4.1 g/dL (ref 3.5–5.2)
Alkaline Phosphatase: 67 U/L (ref 39–117)
Bilirubin, Direct: 0.1 mg/dL (ref 0.0–0.3)
Total Bilirubin: 0.7 mg/dL (ref 0.3–1.2)
Total Protein: 7 g/dL (ref 6.0–8.3)

## 2011-08-19 LAB — HEMOGLOBIN A1C: Hgb A1c MFr Bld: 6.1 % (ref 4.6–6.5)

## 2011-08-19 LAB — BASIC METABOLIC PANEL
BUN: 14 mg/dL (ref 6–23)
CO2: 26 mEq/L (ref 19–32)
Calcium: 9.2 mg/dL (ref 8.4–10.5)
Chloride: 108 mEq/L (ref 96–112)
Creatinine, Ser: 0.8 mg/dL (ref 0.4–1.5)
GFR: 101.39 mL/min (ref >60.00)
Glucose, Bld: 114 mg/dL — ABNORMAL HIGH (ref 70–99)
Potassium: 4 mEq/L (ref 3.5–5.1)
Sodium: 143 mEq/L (ref 135–145)

## 2011-08-20 ENCOUNTER — Encounter (HOSPITAL_COMMUNITY): Payer: Self-pay

## 2011-08-22 ENCOUNTER — Encounter (HOSPITAL_COMMUNITY): Payer: Self-pay

## 2011-08-23 ENCOUNTER — Encounter (HOSPITAL_COMMUNITY): Payer: Self-pay

## 2011-08-27 ENCOUNTER — Encounter (HOSPITAL_COMMUNITY): Payer: Self-pay

## 2011-08-29 ENCOUNTER — Ambulatory Visit (INDEPENDENT_AMBULATORY_CARE_PROVIDER_SITE_OTHER): Payer: Medicare Other | Admitting: Internal Medicine

## 2011-08-29 ENCOUNTER — Encounter (HOSPITAL_COMMUNITY): Payer: Self-pay

## 2011-08-29 ENCOUNTER — Encounter: Payer: Self-pay | Admitting: Internal Medicine

## 2011-08-29 VITALS — BP 138/78 | HR 60 | Ht 68.0 in | Wt 189.1 lb

## 2011-08-29 DIAGNOSIS — I10 Essential (primary) hypertension: Secondary | ICD-10-CM

## 2011-08-29 DIAGNOSIS — I498 Other specified cardiac arrhythmias: Secondary | ICD-10-CM

## 2011-08-29 DIAGNOSIS — E119 Type 2 diabetes mellitus without complications: Secondary | ICD-10-CM

## 2011-08-29 DIAGNOSIS — E785 Hyperlipidemia, unspecified: Secondary | ICD-10-CM

## 2011-08-29 DIAGNOSIS — I2510 Atherosclerotic heart disease of native coronary artery without angina pectoris: Secondary | ICD-10-CM

## 2011-08-29 NOTE — Patient Instructions (Signed)
Your physician wants you to follow-up in: 6 months with Dr Swaziland.   You will receive a reminder letter in the mail two months in advance. If you don't receive a letter, please call our office to schedule the follow-up appointment.  Your physician recommends that you return for lab work in: 6 months (CMET, Lipid, HgbA1C)

## 2011-08-29 NOTE — Progress Notes (Signed)
HPI:  Jacob Curry is a very pleasant 70 year old male with a history of multiple multivessel coronary artery disease being treated medically. He also has a history of hypertension, hyperlipidemia and diabetes.  Recently had a f/u Myoview with large infero-lateral reversible defect. F/u cath in 5/11:  1. Coronary artery disease with chronic total occlusion of a large     left circumflex, which is collateralized by the left anterior     descending coronary artery.  This is unchanged from previous.     Otherwise catheterization notable for branch vessel coronary artery     disease. 2. Left ventricular ejection fraction was 50-55% with lateral     hypokinesis.  We discussed possibility of opening his CTO. But felt it was high risk and given lack of symptoms we are proceeding with medical therapy. At the end of 2011, diagnosed with moderate OSA (AHI 20). Unable to tolerate CPAP due to claustrophobia so now using oral appliance and snoring essentially resolved. Less fatigued in the afternoons.   Here for f/u. Since we last saw him he has retired. 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 Doing well from a cardiac standpoint. Has not gone to cardiac rehab [place] No [place]. BP well controlled. HgBA1c down to 6.1.   Last lipids look good;  Lab Results  Component Value Date   CHOL 117 08/19/2011   HDL 52.50 08/19/2011   LDLCALC 35 08/19/2011   LDLDIRECT 39.9 07/22/2008   TRIG 146.0 08/19/2011   CHOLHDL 2 08/19/2011     ROS: All systems negative except as listed in HPI, PMH and Problem List.  Past Medical History  Diagnosis Date  . CAD (coronary artery disease)   . HTN (hypertension)   . HLD (hyperlipidemia)   . DM (diabetes mellitus)   . History of UTI   . ED (erectile dysfunction)   . Carotid stenosis   . Obesity   . OSA on CPAP     Current Outpatient Prescriptions  Medication Sig Dispense Refill  . aspirin 81 MG tablet Take 81 mg by mouth daily.        . carvedilol  (COREG) 12.5 MG tablet Take 18.75 mg by mouth 2 (two) times daily with a meal.        . clopidogrel (PLAVIX) 75 MG tablet TAKE ONE TABLET BY MOUTH ONE TIME DAILY  30 tablet  8  . fish oil-omega-3 fatty acids 1000 MG capsule Take 2 g by mouth daily.        Marland Kitchen lisinopril (PRINIVIL,ZESTRIL) 20 MG tablet TAKE 2 TABLETS ONCE DAILY  180 tablet  1  . metFORMIN (GLUCOPHAGE) 500 MG tablet TAKE ONE TABLET BY MOUTH TWO TIMES A DAY WITH 2 LARGEST MEALS  180 tablet  2  . Multiple Vitamin (MULTIVITAMIN) capsule Take 1 capsule by mouth daily.        . niacin (NIASPAN) 500 MG CR tablet Take 500 mg by mouth at bedtime.        . nitroGLYCERIN (NITROSTAT) 0.4 MG SL tablet Place 1 tablet (0.4 mg total) under the tongue every 5 (five) minutes as needed.  25 tablet  prn  . Saw Palmetto, Serenoa repens, (SAW PALMETTO PO) Take 1 tablet by mouth daily.        . simvastatin (ZOCOR) 40 MG tablet TAKE 1 TABLET EVERY EVENING  90 tablet  2     PHYSICAL EXAM: Filed Vitals:   08/29/11 0843  BP: 140/76  Pulse: 60  General:  Well appearing. No resp difficulty HEENT: normal Neck: supple. JVP flat. Carotids 2+ bilaterally; no bruits. No lymphadenopathy or thryomegaly appreciated. Cor: PMI normal. Regular rate & rhythm. No rubs, gallops or murmurs. Lungs: clear Abdomen: soft, nontender, nondistended. No hepatosplenomegaly. No bruits or masses. Good bowel sounds. Extremities: no cyanosis, clubbing, rash, edema. Abrasion on LLE Neuro: alert & orientedx3, cranial nerves grossly intact. Moves all 4 extremities w/o difficulty. Affect pleasant.   ECG: SB 60 No ST-T wave abnormalities.     ASSESSMENT & PLAN:

## 2011-08-29 NOTE — Assessment & Plan Note (Signed)
No evidence of ischemia. Continue current regimen. Encouraged him to continue CR. Will follow with Dr. Swaziland.

## 2011-08-29 NOTE — Assessment & Plan Note (Signed)
Lipids look good. Continue statin. Reinforced need for weight loss.

## 2011-08-29 NOTE — Assessment & Plan Note (Signed)
BP mildly elevated here but o/w well controlled.

## 2011-08-30 ENCOUNTER — Encounter (HOSPITAL_COMMUNITY): Payer: Self-pay

## 2011-09-03 ENCOUNTER — Encounter (HOSPITAL_COMMUNITY): Payer: Self-pay

## 2011-09-05 ENCOUNTER — Encounter (HOSPITAL_COMMUNITY)
Admission: RE | Admit: 2011-09-05 | Discharge: 2011-09-05 | Disposition: A | Discharge status: Home / Self Care | Payer: Self-pay | Source: Ambulatory Visit | Attending: Internal Medicine | Admitting: Internal Medicine

## 2011-09-05 DIAGNOSIS — E8881 Metabolic syndrome: Secondary | ICD-10-CM | POA: Insufficient documentation

## 2011-09-05 DIAGNOSIS — Z87891 Personal history of nicotine dependence: Secondary | ICD-10-CM | POA: Insufficient documentation

## 2011-09-05 DIAGNOSIS — I2510 Atherosclerotic heart disease of native coronary artery without angina pectoris: Secondary | ICD-10-CM | POA: Insufficient documentation

## 2011-09-05 DIAGNOSIS — Z5189 Encounter for other specified aftercare: Secondary | ICD-10-CM | POA: Insufficient documentation

## 2011-09-05 DIAGNOSIS — E663 Overweight: Secondary | ICD-10-CM | POA: Insufficient documentation

## 2011-09-05 DIAGNOSIS — E785 Hyperlipidemia, unspecified: Secondary | ICD-10-CM | POA: Insufficient documentation

## 2011-09-05 DIAGNOSIS — I10 Essential (primary) hypertension: Secondary | ICD-10-CM | POA: Insufficient documentation

## 2011-09-05 DIAGNOSIS — Z9861 Coronary angioplasty status: Secondary | ICD-10-CM | POA: Insufficient documentation

## 2011-09-05 DIAGNOSIS — Z7982 Long term (current) use of aspirin: Secondary | ICD-10-CM | POA: Insufficient documentation

## 2011-09-05 DIAGNOSIS — R0602 Shortness of breath: Secondary | ICD-10-CM | POA: Insufficient documentation

## 2011-09-05 DIAGNOSIS — I209 Angina pectoris, unspecified: Secondary | ICD-10-CM | POA: Insufficient documentation

## 2011-09-06 ENCOUNTER — Encounter (HOSPITAL_COMMUNITY): Payer: Self-pay

## 2011-09-10 ENCOUNTER — Encounter (HOSPITAL_COMMUNITY)
Admission: RE | Admit: 2011-09-10 | Discharge: 2011-09-10 | Disposition: A | Discharge status: Home / Self Care | Payer: Self-pay | Source: Ambulatory Visit | Attending: Internal Medicine | Admitting: Internal Medicine

## 2011-09-12 ENCOUNTER — Encounter (HOSPITAL_COMMUNITY): Payer: Self-pay

## 2011-09-13 ENCOUNTER — Encounter (HOSPITAL_COMMUNITY): Payer: Self-pay

## 2011-09-17 ENCOUNTER — Encounter (HOSPITAL_COMMUNITY): Payer: Self-pay

## 2011-09-19 ENCOUNTER — Encounter (HOSPITAL_COMMUNITY)
Admission: RE | Admit: 2011-09-19 | Discharge: 2011-09-19 | Disposition: A | Discharge status: Home / Self Care | Payer: Self-pay | Source: Ambulatory Visit | Attending: Internal Medicine | Admitting: Internal Medicine

## 2011-09-20 ENCOUNTER — Encounter (HOSPITAL_COMMUNITY): Payer: Self-pay

## 2011-09-20 ENCOUNTER — Other Ambulatory Visit: Payer: Self-pay | Admitting: Cardiology

## 2011-09-20 MED ORDER — CARVEDILOL 12.5 MG PO TABS: 18.7500 mg | ORAL_TABLET | Freq: Two times a day (BID) | ORAL | Status: DC

## 2011-09-20 NOTE — Telephone Encounter (Signed)
Refilled carvedilol to [facility]

## 2011-09-20 NOTE — Telephone Encounter (Signed)
Refill   Patient would like prescription for CARVEDILOL 12.5 MG PO TABS Filled via [facility] mail order pharmacy  First refill possibly went to [facility] by mistake        Patient can be reached at hm# for any other questions

## 2011-09-24 ENCOUNTER — Encounter (HOSPITAL_COMMUNITY)
Admission: RE | Admit: 2011-09-24 | Discharge: 2011-09-24 | Disposition: A | Discharge status: Home / Self Care | Payer: Self-pay | Source: Ambulatory Visit | Attending: Internal Medicine | Admitting: Internal Medicine

## 2011-09-26 ENCOUNTER — Encounter (HOSPITAL_COMMUNITY)
Admission: RE | Admit: 2011-09-26 | Discharge: 2011-09-26 | Disposition: A | Discharge status: Home / Self Care | Payer: Self-pay | Source: Ambulatory Visit | Attending: Internal Medicine | Admitting: Internal Medicine

## 2011-09-26 ENCOUNTER — Telehealth: Payer: Self-pay | Admitting: Internal Medicine

## 2011-09-26 NOTE — Telephone Encounter (Signed)
New Problem: ° ° ° °Patient called in needing a refill of his carvedilol (COREG) 12.5 MG tablet. °

## 2011-09-27 ENCOUNTER — Encounter (HOSPITAL_COMMUNITY)
Admission: RE | Admit: 2011-09-27 | Discharge: 2011-09-27 | Disposition: A | Discharge status: Home / Self Care | Payer: Self-pay | Source: Ambulatory Visit | Attending: Internal Medicine | Admitting: Internal Medicine

## 2011-10-01 ENCOUNTER — Encounter (HOSPITAL_COMMUNITY): Payer: Self-pay

## 2011-10-03 ENCOUNTER — Encounter (HOSPITAL_COMMUNITY)
Admission: RE | Admit: 2011-10-03 | Discharge: 2011-10-03 | Disposition: A | Discharge status: Home / Self Care | Payer: Self-pay | Source: Ambulatory Visit | Attending: Internal Medicine | Admitting: Internal Medicine

## 2011-10-04 ENCOUNTER — Encounter (HOSPITAL_COMMUNITY)
Admission: RE | Admit: 2011-10-04 | Discharge: 2011-10-04 | Disposition: A | Discharge status: Home / Self Care | Payer: Self-pay | Source: Ambulatory Visit | Attending: Internal Medicine | Admitting: Internal Medicine

## 2011-10-04 DIAGNOSIS — R0602 Shortness of breath: Secondary | ICD-10-CM | POA: Insufficient documentation

## 2011-10-04 DIAGNOSIS — I2510 Atherosclerotic heart disease of native coronary artery without angina pectoris: Secondary | ICD-10-CM | POA: Insufficient documentation

## 2011-10-04 DIAGNOSIS — Z9861 Coronary angioplasty status: Secondary | ICD-10-CM | POA: Insufficient documentation

## 2011-10-04 DIAGNOSIS — E663 Overweight: Secondary | ICD-10-CM | POA: Insufficient documentation

## 2011-10-04 DIAGNOSIS — E785 Hyperlipidemia, unspecified: Secondary | ICD-10-CM | POA: Insufficient documentation

## 2011-10-04 DIAGNOSIS — E8881 Metabolic syndrome: Secondary | ICD-10-CM | POA: Insufficient documentation

## 2011-10-04 DIAGNOSIS — I10 Essential (primary) hypertension: Secondary | ICD-10-CM | POA: Insufficient documentation

## 2011-10-04 DIAGNOSIS — Z7982 Long term (current) use of aspirin: Secondary | ICD-10-CM | POA: Insufficient documentation

## 2011-10-04 DIAGNOSIS — Z87891 Personal history of nicotine dependence: Secondary | ICD-10-CM | POA: Insufficient documentation

## 2011-10-04 DIAGNOSIS — Z5189 Encounter for other specified aftercare: Secondary | ICD-10-CM | POA: Insufficient documentation

## 2011-10-04 DIAGNOSIS — I209 Angina pectoris, unspecified: Secondary | ICD-10-CM | POA: Insufficient documentation

## 2011-10-08 ENCOUNTER — Encounter (HOSPITAL_COMMUNITY)
Admission: RE | Admit: 2011-10-08 | Discharge: 2011-10-08 | Disposition: A | Discharge status: Home / Self Care | Payer: Self-pay | Source: Ambulatory Visit | Attending: Internal Medicine | Admitting: Internal Medicine

## 2011-10-10 ENCOUNTER — Encounter (HOSPITAL_COMMUNITY)
Admission: RE | Admit: 2011-10-10 | Discharge: 2011-10-10 | Disposition: A | Discharge status: Home / Self Care | Payer: Self-pay | Source: Ambulatory Visit | Attending: Internal Medicine | Admitting: Internal Medicine

## 2011-10-11 ENCOUNTER — Encounter (HOSPITAL_COMMUNITY)
Admission: RE | Admit: 2011-10-11 | Discharge: 2011-10-11 | Disposition: A | Discharge status: Home / Self Care | Payer: Self-pay | Source: Ambulatory Visit | Attending: Internal Medicine | Admitting: Internal Medicine

## 2011-10-15 ENCOUNTER — Encounter (HOSPITAL_COMMUNITY)
Admission: RE | Admit: 2011-10-15 | Discharge: 2011-10-15 | Disposition: A | Discharge status: Home / Self Care | Payer: Self-pay | Source: Ambulatory Visit | Attending: Internal Medicine | Admitting: Internal Medicine

## 2011-10-17 ENCOUNTER — Encounter (HOSPITAL_COMMUNITY): Payer: Self-pay

## 2011-10-18 ENCOUNTER — Encounter (HOSPITAL_COMMUNITY): Payer: Self-pay

## 2011-10-21 DIAGNOSIS — E119 Type 2 diabetes mellitus without complications: Secondary | ICD-10-CM | POA: Diagnosis not present

## 2011-10-21 DIAGNOSIS — H2510 Age-related nuclear cataract, unspecified eye: Secondary | ICD-10-CM | POA: Diagnosis not present

## 2011-10-22 ENCOUNTER — Encounter (HOSPITAL_COMMUNITY)
Admission: RE | Admit: 2011-10-22 | Discharge: 2011-10-22 | Disposition: A | Discharge status: Home / Self Care | Payer: Self-pay | Source: Ambulatory Visit | Attending: Internal Medicine | Admitting: Internal Medicine

## 2011-10-24 ENCOUNTER — Encounter (HOSPITAL_COMMUNITY)
Admission: RE | Admit: 2011-10-24 | Discharge: 2011-10-24 | Disposition: A | Discharge status: Home / Self Care | Payer: Self-pay | Source: Ambulatory Visit | Attending: Internal Medicine | Admitting: Internal Medicine

## 2011-10-25 ENCOUNTER — Encounter (HOSPITAL_COMMUNITY): Payer: Self-pay

## 2011-10-29 ENCOUNTER — Encounter (HOSPITAL_COMMUNITY)
Admission: RE | Admit: 2011-10-29 | Discharge: 2011-10-29 | Disposition: A | Discharge status: Home / Self Care | Payer: Self-pay | Source: Ambulatory Visit | Attending: Internal Medicine | Admitting: Internal Medicine

## 2011-10-31 ENCOUNTER — Other Ambulatory Visit: Payer: Self-pay | Admitting: Internal Medicine

## 2011-10-31 ENCOUNTER — Encounter (HOSPITAL_COMMUNITY)
Admission: RE | Admit: 2011-10-31 | Discharge: 2011-10-31 | Disposition: A | Discharge status: Home / Self Care | Payer: Self-pay | Source: Ambulatory Visit | Attending: Internal Medicine | Admitting: Internal Medicine

## 2011-11-01 ENCOUNTER — Encounter (HOSPITAL_COMMUNITY)
Admission: RE | Admit: 2011-11-01 | Discharge: 2011-11-01 | Disposition: A | Discharge status: Home / Self Care | Payer: Self-pay | Source: Ambulatory Visit | Attending: Internal Medicine | Admitting: Internal Medicine

## 2011-11-01 DIAGNOSIS — I209 Angina pectoris, unspecified: Secondary | ICD-10-CM | POA: Insufficient documentation

## 2011-11-01 DIAGNOSIS — Z5189 Encounter for other specified aftercare: Secondary | ICD-10-CM | POA: Insufficient documentation

## 2011-11-01 DIAGNOSIS — E785 Hyperlipidemia, unspecified: Secondary | ICD-10-CM | POA: Insufficient documentation

## 2011-11-01 DIAGNOSIS — Z9861 Coronary angioplasty status: Secondary | ICD-10-CM | POA: Insufficient documentation

## 2011-11-01 DIAGNOSIS — I10 Essential (primary) hypertension: Secondary | ICD-10-CM | POA: Insufficient documentation

## 2011-11-01 DIAGNOSIS — E8881 Metabolic syndrome: Secondary | ICD-10-CM | POA: Insufficient documentation

## 2011-11-01 DIAGNOSIS — Z87891 Personal history of nicotine dependence: Secondary | ICD-10-CM | POA: Insufficient documentation

## 2011-11-01 DIAGNOSIS — Z7982 Long term (current) use of aspirin: Secondary | ICD-10-CM | POA: Insufficient documentation

## 2011-11-01 DIAGNOSIS — E663 Overweight: Secondary | ICD-10-CM | POA: Insufficient documentation

## 2011-11-01 DIAGNOSIS — R0602 Shortness of breath: Secondary | ICD-10-CM | POA: Insufficient documentation

## 2011-11-01 DIAGNOSIS — I2510 Atherosclerotic heart disease of native coronary artery without angina pectoris: Secondary | ICD-10-CM | POA: Insufficient documentation

## 2011-11-04 ENCOUNTER — Encounter: Payer: Self-pay | Admitting: Internal Medicine

## 2011-11-05 ENCOUNTER — Encounter (HOSPITAL_COMMUNITY)
Admission: RE | Admit: 2011-11-05 | Discharge: 2011-11-05 | Disposition: A | Discharge status: Home / Self Care | Payer: Self-pay | Source: Ambulatory Visit | Attending: Internal Medicine | Admitting: Internal Medicine

## 2011-11-07 ENCOUNTER — Encounter (HOSPITAL_COMMUNITY)
Admission: RE | Admit: 2011-11-07 | Discharge: 2011-11-07 | Disposition: A | Discharge status: Home / Self Care | Payer: Self-pay | Source: Ambulatory Visit | Attending: Internal Medicine | Admitting: Internal Medicine

## 2011-11-08 ENCOUNTER — Encounter (HOSPITAL_COMMUNITY)
Admission: RE | Admit: 2011-11-08 | Discharge: 2011-11-08 | Disposition: A | Discharge status: Home / Self Care | Payer: Self-pay | Source: Ambulatory Visit | Attending: Internal Medicine | Admitting: Internal Medicine

## 2011-11-12 ENCOUNTER — Encounter (HOSPITAL_COMMUNITY): Payer: Self-pay

## 2011-11-14 ENCOUNTER — Encounter (HOSPITAL_COMMUNITY)
Admission: RE | Admit: 2011-11-14 | Discharge: 2011-11-14 | Disposition: A | Discharge status: Home / Self Care | Payer: Self-pay | Source: Ambulatory Visit | Attending: Internal Medicine | Admitting: Internal Medicine

## 2011-11-14 ENCOUNTER — Other Ambulatory Visit (HOSPITAL_COMMUNITY): Payer: Self-pay | Admitting: Internal Medicine

## 2011-11-15 ENCOUNTER — Encounter (HOSPITAL_COMMUNITY)
Admission: RE | Admit: 2011-11-15 | Discharge: 2011-11-15 | Disposition: A | Discharge status: Home / Self Care | Payer: Self-pay | Source: Ambulatory Visit | Attending: Internal Medicine | Admitting: Internal Medicine

## 2011-11-18 ENCOUNTER — Telehealth: Payer: Self-pay | Admitting: *Deleted

## 2011-11-18 ENCOUNTER — Other Ambulatory Visit: Payer: Self-pay | Admitting: *Deleted

## 2011-11-18 MED ORDER — NIACIN ER (ANTIHYPERLIPIDEMIC) 500 MG PO TBCR: 1000.0000 mg | EXTENDED_RELEASE_TABLET | Freq: Every day | ORAL | Status: DC

## 2011-11-18 NOTE — Telephone Encounter (Signed)
11/18/11--pt walked in  stating he needs his niacin refilled and he is having problems as he was a bensimhon pt and since he does not have CHF he has been transferred to dr jordan--he has a f/u call for appoint in June with dr Mathis Dad in to [facility] [place] --niacin 1000mg  take 1 tab po qd--#90--refill x3--pt agrees--nt

## 2011-11-19 ENCOUNTER — Encounter (HOSPITAL_COMMUNITY): Payer: Self-pay

## 2011-11-20 ENCOUNTER — Ambulatory Visit (INDEPENDENT_AMBULATORY_CARE_PROVIDER_SITE_OTHER): Payer: Medicare Other | Admitting: Internal Medicine

## 2011-11-20 VITALS — BP 144/80 | HR 70 | Temp 98.2°F | Wt 193.0 lb

## 2011-11-20 DIAGNOSIS — J069 Acute upper respiratory infection, unspecified: Secondary | ICD-10-CM

## 2011-11-20 MED ORDER — AZITHROMYCIN 250 MG PO TABS: ORAL_TABLET | ORAL | Status: AC

## 2011-11-20 NOTE — Progress Notes (Signed)
  Subjective:    Patient ID: Jacob Curry, male    DOB: 1940-09-28, 71 y.o.   MRN: [id number]  HPI Acute visit sick for the last 2 days with dry cough and sore throat.  Has been exposed to strep throat, patient is concerned.  Past Medical History  Diagnosis Date  . CAD (coronary artery disease)   . HTN (hypertension)   . HLD (hyperlipidemia)   . DM (diabetes mellitus)   . History of UTI   . ED (erectile dysfunction)   . Carotid stenosis   . Obesity   . OSA on CPAP       Review of Systems No fever chills, cough is sometimes preventing him from sleep. No nausea, vomiting, diarrhea. Mild sinus congestion, using a netty pot. No shortness of breath or wheezing.     Objective:   Physical Exam  General -- alert, well-developed, and well-nourished. NAD  Neck --no LADs HEENT -- L TM normal, R slt obscure by wax (unable to remove w/ a spoon), throat w/o redness, face symmetric and not tender to palpation. Lungs -- normal respiratory effort, no intercostal retractions, no accessory muscle use, and normal breath sounds except for few ronchi w/cough.   Heart-- normal rate, regular rhythm, no murmur, and no gallop.   Extremities-- no pretibial edema bilaterally     Assessment & Plan:  URI, Symptoms consistent with a URI, will treat conservatively. The patient has Lawyer at home, okay to use them in addition to Mucinex DM, see instructions. Will provide a Z-Pak in case it is not getting better in few days. He is allergic to oxycodone, has never tried codeine consequently will not prescribe a stronger cough syrup at this point.

## 2011-11-20 NOTE — Patient Instructions (Signed)
Rest, fluids  For cough, take Mucinex DM twice a day as needed  If the cough continue, use tessalon perles up to every 8 hours as needed If you are not improving in the next few days, then take the antibiotic as prescribed (zithromax) Call if no better in few days Call anytime if the symptoms are severe

## 2011-11-21 ENCOUNTER — Encounter: Payer: Self-pay | Admitting: Internal Medicine

## 2011-11-21 ENCOUNTER — Encounter (HOSPITAL_COMMUNITY): Payer: Medicare Other

## 2011-11-22 ENCOUNTER — Encounter (HOSPITAL_COMMUNITY): Payer: Medicare Other

## 2011-11-26 ENCOUNTER — Encounter (HOSPITAL_COMMUNITY): Payer: Medicare Other

## 2011-11-28 ENCOUNTER — Encounter (HOSPITAL_COMMUNITY): Payer: Medicare Other

## 2011-11-29 ENCOUNTER — Encounter (HOSPITAL_COMMUNITY): Payer: Medicare Other

## 2011-12-03 ENCOUNTER — Encounter (HOSPITAL_COMMUNITY): Payer: Self-pay

## 2011-12-03 DIAGNOSIS — Z87891 Personal history of nicotine dependence: Secondary | ICD-10-CM | POA: Insufficient documentation

## 2011-12-03 DIAGNOSIS — E8881 Metabolic syndrome: Secondary | ICD-10-CM | POA: Insufficient documentation

## 2011-12-03 DIAGNOSIS — R0602 Shortness of breath: Secondary | ICD-10-CM | POA: Insufficient documentation

## 2011-12-03 DIAGNOSIS — Z9861 Coronary angioplasty status: Secondary | ICD-10-CM | POA: Insufficient documentation

## 2011-12-03 DIAGNOSIS — E785 Hyperlipidemia, unspecified: Secondary | ICD-10-CM | POA: Insufficient documentation

## 2011-12-03 DIAGNOSIS — Z7982 Long term (current) use of aspirin: Secondary | ICD-10-CM | POA: Insufficient documentation

## 2011-12-03 DIAGNOSIS — I2510 Atherosclerotic heart disease of native coronary artery without angina pectoris: Secondary | ICD-10-CM | POA: Insufficient documentation

## 2011-12-03 DIAGNOSIS — I209 Angina pectoris, unspecified: Secondary | ICD-10-CM | POA: Insufficient documentation

## 2011-12-03 DIAGNOSIS — E663 Overweight: Secondary | ICD-10-CM | POA: Insufficient documentation

## 2011-12-03 DIAGNOSIS — Z5189 Encounter for other specified aftercare: Secondary | ICD-10-CM | POA: Insufficient documentation

## 2011-12-03 DIAGNOSIS — I10 Essential (primary) hypertension: Secondary | ICD-10-CM | POA: Insufficient documentation

## 2011-12-05 ENCOUNTER — Encounter (HOSPITAL_COMMUNITY)
Admission: RE | Admit: 2011-12-05 | Discharge: 2011-12-05 | Disposition: A | Discharge status: Home / Self Care | Payer: Self-pay | Source: Ambulatory Visit | Attending: Internal Medicine | Admitting: Internal Medicine

## 2011-12-06 ENCOUNTER — Encounter (HOSPITAL_COMMUNITY)
Admission: RE | Admit: 2011-12-06 | Discharge: 2011-12-06 | Disposition: A | Discharge status: Home / Self Care | Payer: Self-pay | Source: Ambulatory Visit | Attending: Internal Medicine | Admitting: Internal Medicine

## 2011-12-09 ENCOUNTER — Ambulatory Visit (INDEPENDENT_AMBULATORY_CARE_PROVIDER_SITE_OTHER): Payer: Medicare Other | Admitting: Internal Medicine

## 2011-12-09 ENCOUNTER — Ambulatory Visit (HOSPITAL_BASED_OUTPATIENT_CLINIC_OR_DEPARTMENT_OTHER)
Admission: RE | Admit: 2011-12-09 | Discharge: 2011-12-09 | Disposition: A | Discharge status: Home / Self Care | Payer: Medicare Other | Source: Ambulatory Visit | Attending: Internal Medicine | Admitting: Internal Medicine

## 2011-12-09 ENCOUNTER — Encounter: Payer: Self-pay | Admitting: Internal Medicine

## 2011-12-09 VITALS — BP 108/56 | HR 70 | Temp 98.5°F | Wt 190.8 lb

## 2011-12-09 DIAGNOSIS — R05 Cough: Secondary | ICD-10-CM | POA: Diagnosis not present

## 2011-12-09 DIAGNOSIS — R059 Cough, unspecified: Secondary | ICD-10-CM | POA: Diagnosis not present

## 2011-12-09 DIAGNOSIS — I10 Essential (primary) hypertension: Secondary | ICD-10-CM

## 2011-12-09 DIAGNOSIS — J209 Acute bronchitis, unspecified: Secondary | ICD-10-CM

## 2011-12-09 DIAGNOSIS — R918 Other nonspecific abnormal finding of lung field: Secondary | ICD-10-CM

## 2011-12-09 MED ORDER — PREDNISONE 20 MG PO TABS: 20.0000 mg | ORAL_TABLET | Freq: Two times a day (BID) | ORAL | Status: AC

## 2011-12-09 MED ORDER — HYDROCODONE-HOMATROPINE 5-1.5 MG/5ML PO SYRP: ORAL_SOLUTION | ORAL | Status: DC

## 2011-12-09 NOTE — Progress Notes (Signed)
  Subjective:    Patient ID: Jacob Curry, male    DOB: November 08, 1940, 71 y.o.   MRN: [id number]  HPI He was seen 11/20/11 for upper respiratory tract infection. He never filled the Z-Pak but felt that he had improved significantly with a cough suppressant, Mucinex  and time.  The cough exacerbated 12/06/11; his cough to the point that he has some chest wall discomfort.     Review of Systems Exposures (illness/environmental/extrinsic):no Treatments/response:Neti pot Fever/chills/sweats:temp to 101.5 this am Frontal headache:no Facial pain:no Nasal purulence:no Sore throat:no Dental pain:no Lymphadenopathy:no Wheezing/shortness of breath:no Cough/sputum/hemoptysis:scant thick clear sputum last night  Associated extrinsic/allergic symptoms:itchy eyes/ sneezing:no Past medical history: Seasonal allergies : no/asthma:no             Objective:   Physical Exam General appearance:good health ;well nourished; no acute distress or increased work of breathing is present.  No  lymphadenopathy about the head, neck, or axilla noted.   Eyes: No conjunctival inflammation or lid edema is present.   Ears:  External ear exam shows no significant lesions or deformities.  Otoscopic examination reveals clear canals, tympanic membranes are intact bilaterally without bulging, retraction, inflammation or discharge.  Nose:  External nasal examination shows no deformity or inflammation. Nasal mucosa are pink and moist without lesions or exudates. No septal dislocation or deviation.No obstruction to airflow.   Oral exam: Dental hygiene is good; lips and gums are healthy appearing.There is no oropharyngeal erythema or exudate noted.      Heart:  Normal rate and regular rhythm. S1 and S2 normal without gallop, murmur, click, rub . S 4.   Lungs:Chest clear to auscultation; no wheezes, rhonchi,rales ,or rubs present.No increased work of breathing.  Paroxysmal dry cough  Extremities:  No cyanosis,  edema, or clubbing  noted    Skin: Warm & dry          Assessment & Plan:  #1 acute bronchitis w/o bronchospasm Plan: See orders and recommendations

## 2011-12-09 NOTE — Patient Instructions (Addendum)
Nasal cleansing in the shower as discussed. Make sure that all residual soap is removed to prevent irritation. Plain Mucinex for thick secretions ;force NON dairy fluids. Use a Neti pot daily as needed for sinus congestion . Fill prescription for Z-Pak Order for x-rays entered into  the computer; these will be performed at [facility]. No appointment is necessary.

## 2011-12-10 ENCOUNTER — Encounter (HOSPITAL_COMMUNITY): Payer: Self-pay

## 2011-12-10 ENCOUNTER — Telehealth: Payer: Self-pay | Admitting: Internal Medicine

## 2011-12-10 NOTE — Telephone Encounter (Signed)
Pharmacy called & stated there were no instruction on the prescription for  Hydrocodone-Homatropine (Syrup) HYCODAN 5-1.5 MG/5ML He did go ahead & give the patient the medication without instructions, but wanted to let us know so we could call patient with instructions on how he should take Patient home# [phone]

## 2011-12-10 NOTE — Telephone Encounter (Signed)
Patient walked in this morning and I gave instructions per Dr.Hopper: 1 teaspoon every 6 hours as needed

## 2011-12-12 ENCOUNTER — Telehealth: Payer: Self-pay

## 2011-12-12 ENCOUNTER — Encounter (HOSPITAL_COMMUNITY): Payer: Self-pay

## 2011-12-12 DIAGNOSIS — R9389 Abnormal findings on diagnostic imaging of other specified body structures: Secondary | ICD-10-CM

## 2011-12-12 NOTE — Telephone Encounter (Signed)
Message copied by Maurice Small on Thu Dec 12, 2011  9:42 AM ------      Message from: Pecola Lawless      Created: Tue Dec 10, 2011  6:15 PM       The chest x-ray should be repeated 7-10 days after the antibiotics have been finished. If these upper lobe changes do not resolve; then a CT scan would be indicated. [employer]

## 2011-12-12 NOTE — Telephone Encounter (Signed)
Patient aware of instructions. Dr.Hopper spoke with patient and stated possible walking pneumonia, need to follow closely

## 2011-12-13 ENCOUNTER — Encounter (HOSPITAL_COMMUNITY): Payer: Self-pay

## 2011-12-16 ENCOUNTER — Ambulatory Visit (INDEPENDENT_AMBULATORY_CARE_PROVIDER_SITE_OTHER): Payer: Medicare Other | Admitting: Internal Medicine

## 2011-12-16 ENCOUNTER — Encounter: Payer: Self-pay | Admitting: Internal Medicine

## 2011-12-16 ENCOUNTER — Ambulatory Visit (HOSPITAL_BASED_OUTPATIENT_CLINIC_OR_DEPARTMENT_OTHER)
Admission: RE | Admit: 2011-12-16 | Discharge: 2011-12-16 | Disposition: A | Discharge status: Home / Self Care | Payer: Medicare Other | Source: Ambulatory Visit | Attending: Internal Medicine | Admitting: Internal Medicine

## 2011-12-16 VITALS — BP 158/80 | HR 67 | Temp 98.1°F | Wt 191.4 lb

## 2011-12-16 DIAGNOSIS — I10 Essential (primary) hypertension: Secondary | ICD-10-CM | POA: Diagnosis not present

## 2011-12-16 DIAGNOSIS — R9389 Abnormal findings on diagnostic imaging of other specified body structures: Secondary | ICD-10-CM

## 2011-12-16 DIAGNOSIS — J189 Pneumonia, unspecified organism: Secondary | ICD-10-CM

## 2011-12-16 DIAGNOSIS — E119 Type 2 diabetes mellitus without complications: Secondary | ICD-10-CM | POA: Diagnosis not present

## 2011-12-16 DIAGNOSIS — R918 Other nonspecific abnormal finding of lung field: Secondary | ICD-10-CM

## 2011-12-16 DIAGNOSIS — R059 Cough, unspecified: Secondary | ICD-10-CM | POA: Insufficient documentation

## 2011-12-16 DIAGNOSIS — R05 Cough: Secondary | ICD-10-CM | POA: Insufficient documentation

## 2011-12-16 MED ORDER — AZITHROMYCIN 250 MG PO TABS: ORAL_TABLET | ORAL | Status: AC

## 2011-12-16 MED ORDER — CEFUROXIME AXETIL 500 MG PO TABS: 500.0000 mg | ORAL_TABLET | Freq: Two times a day (BID) | ORAL | Status: AC

## 2011-12-16 NOTE — Patient Instructions (Signed)
Advair one inhalation every 12 hours; gargle and spit after use . Order for x-rays will be  entered into  the computer; these will be performed at [facility]. No appointment is necessary.

## 2011-12-16 NOTE — Progress Notes (Signed)
  Subjective:    Patient ID: Jacob Curry, male    DOB: 1941/01/21, 71 y.o.   MRN: [id number]  HPI He states his cough has improved but persists, mainly at night. He has completed the Zithromax. He continues to have intermittent sweats without fever or chills .   He denies sputum induction, hemoptysis, dyspnea, or wheezing.  Chest x-ray does show a persistent, irregular inhomogenous infiltrate in the right upper lobe. The density is decreased compared to the previous study but is not cleared totally.  He smoked for 28 years but  a pack a day or less. There is no personal or family history of asthma, bronchitis, tuberculosis, lung cancer.  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 He does not check his sugars . He denies polydipsia, polyphagia, or polyuria.   He's had a 10 pound weight loss which was intentional over 3 months     Objective:   Physical Exam General appearance:good health ;well nourished; no acute distress or increased work of breathing is present.  No  lymphadenopathy about the head, neck, or axilla noted.   Eyes: No conjunctival inflammation or lid edema is present. There is no scleral icterus.  Ears:  External ear exam shows no significant lesions or deformities.  Otoscopic examination reveals clear canals, tympanic membranes are intact bilaterally without bulging, retraction, inflammation or discharge.  Nose:  External nasal examination shows no deformity or inflammation. Nasal mucosa are pink and moist without lesions or exudates. No septal dislocation or deviation.No obstruction to airflow. Hyponasal speech  Oral exam: Dental hygiene is good; lips and gums are healthy appearing.There is no oropharyngeal erythema or exudate noted.      Heart:  Normal rate and regular rhythm. S1 and S2 normal without gallop, murmur, click, rub . S 4 Lungs:Chest  clear to auscultation; no wheezes, rhonchi,rales ,or rubs present.No increased work of breathing.    Extremities:  No cyanosis, edema, or clubbing  noted    Skin: Warm & dry w/o jaundice or tenting.          Assessment & Plan:  #1 community-acquired pneumonia right upper lobe; some improvement after course of Zithromax. He will be given samples of Avelox to take for one week with repeat film at that time. CAT scan will be deferred as he states that he is significantly improved and there has been some improvement radiographically.

## 2011-12-17 ENCOUNTER — Encounter (HOSPITAL_COMMUNITY): Payer: Self-pay

## 2011-12-19 ENCOUNTER — Encounter (HOSPITAL_COMMUNITY): Payer: Self-pay

## 2011-12-20 ENCOUNTER — Encounter (HOSPITAL_COMMUNITY): Payer: Self-pay

## 2011-12-23 ENCOUNTER — Ambulatory Visit (HOSPITAL_BASED_OUTPATIENT_CLINIC_OR_DEPARTMENT_OTHER)
Admission: RE | Admit: 2011-12-23 | Discharge: 2011-12-23 | Disposition: A | Discharge status: Home / Self Care | Payer: Medicare Other | Source: Ambulatory Visit | Attending: Internal Medicine | Admitting: Internal Medicine

## 2011-12-23 ENCOUNTER — Ambulatory Visit: Payer: Medicare Other | Admitting: Internal Medicine

## 2011-12-23 ENCOUNTER — Ambulatory Visit (INDEPENDENT_AMBULATORY_CARE_PROVIDER_SITE_OTHER): Payer: Medicare Other | Admitting: Internal Medicine

## 2011-12-23 ENCOUNTER — Other Ambulatory Visit: Payer: Self-pay | Admitting: Internal Medicine

## 2011-12-23 VITALS — BP 144/70 | HR 65 | Temp 97.9°F | Wt 186.0 lb

## 2011-12-23 DIAGNOSIS — J189 Pneumonia, unspecified organism: Secondary | ICD-10-CM

## 2011-12-23 DIAGNOSIS — R918 Other nonspecific abnormal finding of lung field: Secondary | ICD-10-CM

## 2011-12-23 DIAGNOSIS — R9389 Abnormal findings on diagnostic imaging of other specified body structures: Secondary | ICD-10-CM

## 2011-12-23 NOTE — Patient Instructions (Signed)
Please  blowup at least 10  balloons a day to enhance inflation of the lungs and prevent atelectasis as we discussed. Please schedule followup Medicare physical in 2-3 months

## 2011-12-23 NOTE — Progress Notes (Signed)
  Subjective:    Patient ID: Jacob Curry, male    DOB: 03/04/1941, 71 y.o.   MRN: [id number]  HPI He states his cough is "95%" improved. He does have some residual fatigue but has been sleeping well.  He specifically denies fever, chills, sweats, chest pain, dyspnea,pleuritic pain , hemoptysis or paroxysmal nocturnal dyspnea  Chest x-ray report describes a scarlike opacity in the right upper lobe. All 3 chest x-rays 4/8 -12/23/11 were reviewed with him. There had initially been an ill-defined infiltrate in right upper lobe with questionable central lucency suggestive of possible lung abscess. There appears to be linear scarring/atelectasis remaining.      Review of Systems He denies any frontal headache, facial pain, or nasal purulence. He's  developed a lesion at the base of the right naree which has scabbed over; he's been treating this with topical over-the-counter medication.     Objective:   Physical Exam General appearance:good health ;well nourished; no acute distress or increased work of breathing is present.  No  lymphadenopathy about the head, neck, or axilla noted.   Eyes: No conjunctival inflammation or lid edema is present.   Ears:  External ear exam shows no significant lesions or deformities.  Otoscopic examination reveals clear canals, tympanic membranes are intact bilaterally without bulging, retraction, inflammation or discharge.  Nose:  External nasal examination reveals healing simplex @ R nare. Nasal mucosa are dry without lesions or exudates. No septal dislocation or deviation.No obstruction to airflow.   Oral exam: Dental hygiene is good; lips and gums are healthy appearing.There is no oropharyngeal erythema or exudate noted.     Heart:  Normal rate and regular rhythm. S1 and S2 normal without gallop, murmur, click, rub or other extra sounds.   Lungs:Chest clear to auscultation; no wheezes, rhonchi,rales ,or rubs present.No increased work of breathing.     Extremities:  No cyanosis, edema, or clubbing  noted    Skin: Warm & dry         Assessment & Plan:  #1 right upper lobe community-acquired pneumonia, resolving. Residual atelectasis/scar suggested  Plan: Modified Incentive Spirometry will be recommended.

## 2011-12-24 ENCOUNTER — Encounter (HOSPITAL_COMMUNITY)
Admission: RE | Admit: 2011-12-24 | Discharge: 2011-12-24 | Disposition: A | Discharge status: Home / Self Care | Payer: Self-pay | Source: Ambulatory Visit | Attending: Internal Medicine | Admitting: Internal Medicine

## 2011-12-26 ENCOUNTER — Encounter (HOSPITAL_COMMUNITY)
Admission: RE | Admit: 2011-12-26 | Discharge: 2011-12-26 | Disposition: A | Discharge status: Home / Self Care | Payer: Self-pay | Source: Ambulatory Visit | Attending: Internal Medicine | Admitting: Internal Medicine

## 2011-12-27 ENCOUNTER — Encounter (HOSPITAL_COMMUNITY)
Admission: RE | Admit: 2011-12-27 | Discharge: 2011-12-27 | Disposition: A | Discharge status: Home / Self Care | Payer: Self-pay | Source: Ambulatory Visit | Attending: Internal Medicine | Admitting: Internal Medicine

## 2011-12-31 ENCOUNTER — Encounter (HOSPITAL_COMMUNITY)
Admission: RE | Admit: 2011-12-31 | Discharge: 2011-12-31 | Disposition: A | Discharge status: Home / Self Care | Payer: Self-pay | Source: Ambulatory Visit | Attending: Internal Medicine | Admitting: Internal Medicine

## 2012-01-02 ENCOUNTER — Encounter (HOSPITAL_COMMUNITY): Payer: Self-pay

## 2012-01-02 DIAGNOSIS — R0602 Shortness of breath: Secondary | ICD-10-CM | POA: Insufficient documentation

## 2012-01-02 DIAGNOSIS — I10 Essential (primary) hypertension: Secondary | ICD-10-CM | POA: Insufficient documentation

## 2012-01-02 DIAGNOSIS — Z9861 Coronary angioplasty status: Secondary | ICD-10-CM | POA: Insufficient documentation

## 2012-01-02 DIAGNOSIS — Z5189 Encounter for other specified aftercare: Secondary | ICD-10-CM | POA: Insufficient documentation

## 2012-01-02 DIAGNOSIS — I2510 Atherosclerotic heart disease of native coronary artery without angina pectoris: Secondary | ICD-10-CM | POA: Insufficient documentation

## 2012-01-02 DIAGNOSIS — I209 Angina pectoris, unspecified: Secondary | ICD-10-CM | POA: Insufficient documentation

## 2012-01-02 DIAGNOSIS — Z87891 Personal history of nicotine dependence: Secondary | ICD-10-CM | POA: Insufficient documentation

## 2012-01-02 DIAGNOSIS — E785 Hyperlipidemia, unspecified: Secondary | ICD-10-CM | POA: Insufficient documentation

## 2012-01-02 DIAGNOSIS — E8881 Metabolic syndrome: Secondary | ICD-10-CM | POA: Insufficient documentation

## 2012-01-02 DIAGNOSIS — E663 Overweight: Secondary | ICD-10-CM | POA: Insufficient documentation

## 2012-01-02 DIAGNOSIS — Z7982 Long term (current) use of aspirin: Secondary | ICD-10-CM | POA: Insufficient documentation

## 2012-01-03 ENCOUNTER — Encounter (HOSPITAL_COMMUNITY): Payer: Self-pay

## 2012-01-07 ENCOUNTER — Encounter (HOSPITAL_COMMUNITY): Payer: Self-pay

## 2012-01-09 ENCOUNTER — Encounter (HOSPITAL_COMMUNITY): Payer: Self-pay

## 2012-01-10 ENCOUNTER — Encounter (HOSPITAL_COMMUNITY): Payer: Self-pay

## 2012-01-14 ENCOUNTER — Encounter (HOSPITAL_COMMUNITY)
Admission: RE | Admit: 2012-01-14 | Discharge: 2012-01-14 | Disposition: A | Discharge status: Home / Self Care | Payer: Self-pay | Source: Ambulatory Visit | Attending: Internal Medicine | Admitting: Internal Medicine

## 2012-01-16 ENCOUNTER — Encounter (HOSPITAL_COMMUNITY)
Admission: RE | Admit: 2012-01-16 | Discharge: 2012-01-16 | Disposition: A | Discharge status: Home / Self Care | Payer: Self-pay | Source: Ambulatory Visit | Attending: Internal Medicine | Admitting: Internal Medicine

## 2012-01-17 ENCOUNTER — Encounter (HOSPITAL_COMMUNITY)
Admission: RE | Admit: 2012-01-17 | Discharge: 2012-01-17 | Disposition: A | Discharge status: Home / Self Care | Payer: Self-pay | Source: Ambulatory Visit | Attending: Internal Medicine | Admitting: Internal Medicine

## 2012-01-21 ENCOUNTER — Encounter (HOSPITAL_COMMUNITY): Payer: Self-pay

## 2012-01-23 ENCOUNTER — Encounter (HOSPITAL_COMMUNITY): Payer: Self-pay

## 2012-01-24 ENCOUNTER — Encounter (HOSPITAL_COMMUNITY): Payer: Self-pay

## 2012-01-28 ENCOUNTER — Encounter (HOSPITAL_COMMUNITY): Payer: Self-pay

## 2012-01-30 ENCOUNTER — Encounter (HOSPITAL_COMMUNITY): Payer: Self-pay

## 2012-01-31 ENCOUNTER — Encounter (HOSPITAL_COMMUNITY): Payer: Self-pay

## 2012-02-04 ENCOUNTER — Encounter (HOSPITAL_COMMUNITY)
Admission: RE | Admit: 2012-02-04 | Discharge: 2012-02-04 | Disposition: A | Discharge status: Home / Self Care | Payer: Self-pay | Source: Ambulatory Visit | Attending: Internal Medicine | Admitting: Internal Medicine

## 2012-02-04 DIAGNOSIS — E8881 Metabolic syndrome: Secondary | ICD-10-CM | POA: Insufficient documentation

## 2012-02-04 DIAGNOSIS — Z5189 Encounter for other specified aftercare: Secondary | ICD-10-CM | POA: Insufficient documentation

## 2012-02-04 DIAGNOSIS — I10 Essential (primary) hypertension: Secondary | ICD-10-CM | POA: Insufficient documentation

## 2012-02-04 DIAGNOSIS — I2510 Atherosclerotic heart disease of native coronary artery without angina pectoris: Secondary | ICD-10-CM | POA: Insufficient documentation

## 2012-02-04 DIAGNOSIS — Z87891 Personal history of nicotine dependence: Secondary | ICD-10-CM | POA: Insufficient documentation

## 2012-02-04 DIAGNOSIS — I209 Angina pectoris, unspecified: Secondary | ICD-10-CM | POA: Insufficient documentation

## 2012-02-04 DIAGNOSIS — R0602 Shortness of breath: Secondary | ICD-10-CM | POA: Insufficient documentation

## 2012-02-04 DIAGNOSIS — Z7982 Long term (current) use of aspirin: Secondary | ICD-10-CM | POA: Insufficient documentation

## 2012-02-04 DIAGNOSIS — E663 Overweight: Secondary | ICD-10-CM | POA: Insufficient documentation

## 2012-02-04 DIAGNOSIS — Z9861 Coronary angioplasty status: Secondary | ICD-10-CM | POA: Insufficient documentation

## 2012-02-04 DIAGNOSIS — E785 Hyperlipidemia, unspecified: Secondary | ICD-10-CM | POA: Insufficient documentation

## 2012-02-06 ENCOUNTER — Encounter (HOSPITAL_COMMUNITY): Payer: Self-pay

## 2012-02-07 ENCOUNTER — Encounter (HOSPITAL_COMMUNITY): Payer: Self-pay

## 2012-02-11 ENCOUNTER — Other Ambulatory Visit (HOSPITAL_COMMUNITY): Payer: Self-pay | Admitting: Internal Medicine

## 2012-02-11 ENCOUNTER — Encounter (HOSPITAL_COMMUNITY): Payer: Self-pay

## 2012-02-13 ENCOUNTER — Encounter (HOSPITAL_COMMUNITY)
Admission: RE | Admit: 2012-02-13 | Discharge: 2012-02-13 | Disposition: A | Discharge status: Home / Self Care | Payer: Self-pay | Source: Ambulatory Visit | Attending: Internal Medicine | Admitting: Internal Medicine

## 2012-02-14 ENCOUNTER — Encounter (HOSPITAL_COMMUNITY)
Admission: RE | Admit: 2012-02-14 | Discharge: 2012-02-14 | Disposition: A | Discharge status: Home / Self Care | Payer: Self-pay | Source: Ambulatory Visit | Attending: Internal Medicine | Admitting: Internal Medicine

## 2012-02-18 ENCOUNTER — Encounter (HOSPITAL_COMMUNITY): Payer: Self-pay

## 2012-02-20 ENCOUNTER — Encounter (HOSPITAL_COMMUNITY): Payer: Self-pay

## 2012-02-21 ENCOUNTER — Encounter (HOSPITAL_COMMUNITY)
Admission: RE | Admit: 2012-02-21 | Discharge: 2012-02-21 | Disposition: A | Discharge status: Home / Self Care | Payer: Self-pay | Source: Ambulatory Visit | Attending: Internal Medicine | Admitting: Internal Medicine

## 2012-02-25 ENCOUNTER — Encounter (HOSPITAL_COMMUNITY): Payer: Self-pay

## 2012-02-27 ENCOUNTER — Encounter (HOSPITAL_COMMUNITY): Payer: Self-pay

## 2012-02-28 ENCOUNTER — Encounter (HOSPITAL_COMMUNITY): Payer: Self-pay

## 2012-03-02 ENCOUNTER — Encounter: Payer: Self-pay | Admitting: Internal Medicine

## 2012-03-02 ENCOUNTER — Encounter (HOSPITAL_COMMUNITY)
Admission: RE | Admit: 2012-03-02 | Discharge: 2012-03-02 | Disposition: A | Discharge status: Home / Self Care | Payer: Self-pay | Source: Ambulatory Visit | Attending: Cardiology | Admitting: Cardiology

## 2012-03-02 ENCOUNTER — Ambulatory Visit (INDEPENDENT_AMBULATORY_CARE_PROVIDER_SITE_OTHER): Payer: Medicare Other | Admitting: Internal Medicine

## 2012-03-02 ENCOUNTER — Ambulatory Visit (HOSPITAL_BASED_OUTPATIENT_CLINIC_OR_DEPARTMENT_OTHER)
Admission: RE | Admit: 2012-03-02 | Discharge: 2012-03-02 | Disposition: A | Discharge status: Home / Self Care | Payer: Medicare Other | Source: Ambulatory Visit | Attending: Internal Medicine | Admitting: Internal Medicine

## 2012-03-02 ENCOUNTER — Other Ambulatory Visit: Payer: Self-pay | Admitting: Internal Medicine

## 2012-03-02 VITALS — BP 130/72 | HR 65 | Temp 97.5°F | Resp 12 | Ht 66.0 in | Wt 189.2 lb

## 2012-03-02 DIAGNOSIS — Z9861 Coronary angioplasty status: Secondary | ICD-10-CM | POA: Insufficient documentation

## 2012-03-02 DIAGNOSIS — I10 Essential (primary) hypertension: Secondary | ICD-10-CM | POA: Diagnosis not present

## 2012-03-02 DIAGNOSIS — Z0000 Encounter for general adult medical examination without abnormal findings: Secondary | ICD-10-CM

## 2012-03-02 DIAGNOSIS — E8881 Metabolic syndrome: Secondary | ICD-10-CM | POA: Insufficient documentation

## 2012-03-02 DIAGNOSIS — Z87891 Personal history of nicotine dependence: Secondary | ICD-10-CM | POA: Insufficient documentation

## 2012-03-02 DIAGNOSIS — E785 Hyperlipidemia, unspecified: Secondary | ICD-10-CM

## 2012-03-02 DIAGNOSIS — R918 Other nonspecific abnormal finding of lung field: Secondary | ICD-10-CM | POA: Insufficient documentation

## 2012-03-02 DIAGNOSIS — R9389 Abnormal findings on diagnostic imaging of other specified body structures: Secondary | ICD-10-CM

## 2012-03-02 DIAGNOSIS — Z5189 Encounter for other specified aftercare: Secondary | ICD-10-CM | POA: Insufficient documentation

## 2012-03-02 DIAGNOSIS — E663 Overweight: Secondary | ICD-10-CM | POA: Insufficient documentation

## 2012-03-02 DIAGNOSIS — R0602 Shortness of breath: Secondary | ICD-10-CM | POA: Insufficient documentation

## 2012-03-02 DIAGNOSIS — Z7982 Long term (current) use of aspirin: Secondary | ICD-10-CM | POA: Insufficient documentation

## 2012-03-02 DIAGNOSIS — I2510 Atherosclerotic heart disease of native coronary artery without angina pectoris: Secondary | ICD-10-CM | POA: Insufficient documentation

## 2012-03-02 DIAGNOSIS — I209 Angina pectoris, unspecified: Secondary | ICD-10-CM | POA: Insufficient documentation

## 2012-03-02 NOTE — Patient Instructions (Addendum)
Preventive Health Care: Exercise at least 30-45 minutes a day,  3-4 days a week.  Eat a low-fat diet with lots of fruits and vegetables, up to 7-9 servings per day.  Consume less than 40 grams of sugar per day from foods & drinks with High Fructose Corn Sugar as #1,2,3 or # 4 on label. Eye Doctor - have an eye exam @ least annually.                                                         Please  schedule fasting Labs : BMET,Lipids, hepatic panel, CBC & dif, TSH,A1c, urine microalbumin.PLEASE BRING THESE INSTRUCTIONS TO FOLLOW UP  LAB APPOINTMENT.This will guarantee correct labs are drawn, eliminating need for repeat blood sampling ( needle sticks ! ). Diagnoses /Codes: 401.9,272.4,790.29,995.20.   Please try to go on My Chart within the next 24 hours to allow me to release the results directly to you.

## 2012-03-02 NOTE — Progress Notes (Signed)
Subjective:    Patient ID: Jacob Curry, male    DOB: 02/21/1941, 71 y.o.   MRN: [id number]  HPI Medicare Wellness Visit:  The following psychosocial & medical history were reviewed as required by Medicare.   Social history: caffeine: 2 Diet [employer] , alcohol:  no ,  tobacco use : quit 1992  & exercise : building cabin.   Home & personal  safety / fall risk: no issues, activities of daily living: no limitations , seatbelt use : yes , and smoke alarm employment : yes .  Power of Attorney/Living Will status : yes  Vision ( as recorded per Nurse) & Hearing  evaluation :  Ophth exam 4/13; see exam. Orientation :oriented X 3 , memory & recall :good,  math testing: missed one,and mood & affect : normal . Depression / anxiety: denied Travel history : [place] , immunization status :up to date , transfusion history:  no, and preventive health surveillance ( colonoscopies, BMD , etc as per protocol/ [facility]): colonoscopy due, Dental care:  Seen every 4-5 mos . Chart reviewed &  Updated. Active issues reviewed & addressed.       Review of Systems HYPERTENSION: Disease Monitoring: Blood pressure range-114/58-130/80 @ Rehab  Chest pain, palpitations- no       Dyspnea- no Medications: Compliance- yes  Lightheadedness,Syncope- no   Edema- no  FASTING HYPERGLYCEMIA, PMH of:  Disease Monitoring: Blood Sugar ranges-not monitored  Polyuria/phagia/dipsia-no       Visual problems-no Medications: Compliance- yes Hypoglycemic symptoms- no  HYPERLIPIDEMIA: Disease Monitoring: See symptoms for Hypertension Medications: Compliance- yes  Abd pain, bowel changes- no  Muscle aches- no          Objective:   Physical Exam Gen.:  well-nourished in appearance. Alert, appropriate and cooperative throughout exam. Head: Normocephalic without obvious abnormalities Eyes: No corneal or conjunctival inflammation noted. Pupils equal round reactive to light and accommodation. Fundal exam is benign  without hemorrhages, exudate, papilledema. Extraocular motion intact. Vision grossly normal. Ears: External  ear exam reveals no significant lesions or deformities. Canals clear .TMs normal. Hearing is grossly normal bilaterally. Nose: External nasal exam reveals no deformity or inflammation. Nasal mucosa are pink and moist. No lesions or exudates noted.  Mouth: Oral mucosa and oropharynx reveal no lesions or exudates. Teeth in good repair. Neck: No deformities, masses, or tenderness noted. Range of motion & Thyroid normal. Lungs: Normal respiratory effort; chest expands symmetrically. Lungs are clear to auscultation without rales, wheezes, or increased work of breathing. Heart: Normal rate and rhythm. Normal S1 and S2. No gallop, click, or rub. S4 w/o murmur. Abdomen: Bowel sounds normal; abdomen soft and nontender. No masses, organomegaly . Ventral  hernia noted. Genitalia: Dr Darvin Neighbours  .                                                                                   Musculoskeletal/extremities: Minor lordosis noted of  the thoracic  spine. No clubbing, cyanosis, edema, or deformity noted. Range of motion  normal .Tone & strength  normal.Joints normal. Nail health  good. Vascular: Carotid, radial artery,  and  posterior tibial pulses are full and equal. Decreased dorsalis  pedis pulses.No bruits present. Neurologic: Alert and oriented x3. Deep tendon reflexes symmetrical and normal.          Skin: Intact without suspicious lesions or rashes. Lymph: No cervical, axillary lymphadenopathy present. Psych: Mood and affect are normal. Normally interactive                                                                                         Assessment & Plan:  #1 Medicare Wellness Exam; criteria met ; data entered #2 Problem List reviewed ; Assessment/ Recommendations made Plan: see Orders

## 2012-03-03 ENCOUNTER — Encounter (HOSPITAL_COMMUNITY): Payer: Self-pay

## 2012-03-05 ENCOUNTER — Encounter (HOSPITAL_COMMUNITY): Payer: Self-pay

## 2012-03-06 ENCOUNTER — Encounter (HOSPITAL_COMMUNITY): Payer: Self-pay

## 2012-03-09 ENCOUNTER — Encounter (HOSPITAL_COMMUNITY)
Admission: RE | Admit: 2012-03-09 | Discharge: 2012-03-09 | Disposition: A | Discharge status: Home / Self Care | Payer: Self-pay | Source: Ambulatory Visit | Attending: Internal Medicine | Admitting: Internal Medicine

## 2012-03-09 ENCOUNTER — Other Ambulatory Visit (INDEPENDENT_AMBULATORY_CARE_PROVIDER_SITE_OTHER): Payer: Medicare Other

## 2012-03-09 DIAGNOSIS — E119 Type 2 diabetes mellitus without complications: Secondary | ICD-10-CM

## 2012-03-09 DIAGNOSIS — E785 Hyperlipidemia, unspecified: Secondary | ICD-10-CM | POA: Diagnosis not present

## 2012-03-09 DIAGNOSIS — I10 Essential (primary) hypertension: Secondary | ICD-10-CM

## 2012-03-09 DIAGNOSIS — I498 Other specified cardiac arrhythmias: Secondary | ICD-10-CM | POA: Diagnosis not present

## 2012-03-09 DIAGNOSIS — I2510 Atherosclerotic heart disease of native coronary artery without angina pectoris: Secondary | ICD-10-CM | POA: Diagnosis not present

## 2012-03-09 LAB — COMPREHENSIVE METABOLIC PANEL
ALT: 29 U/L (ref 0–53)
AST: 25 U/L (ref 0–37)
Albumin: 3.9 g/dL (ref 3.5–5.2)
Alkaline Phosphatase: 66 U/L (ref 39–117)
BUN: 11 mg/dL (ref 6–23)
CO2: 29 mEq/L (ref 19–32)
Calcium: 8.8 mg/dL (ref 8.4–10.5)
Chloride: 107 mEq/L (ref 96–112)
Creatinine, Ser: 0.8 mg/dL (ref 0.4–1.5)
GFR: 101.23 mL/min (ref >60.00)
Glucose, Bld: 127 mg/dL — ABNORMAL HIGH (ref 70–99)
Potassium: 3.7 mEq/L (ref 3.5–5.1)
Sodium: 142 mEq/L (ref 135–145)
Total Bilirubin: 0.7 mg/dL (ref 0.3–1.2)
Total Protein: 6.8 g/dL (ref 6.0–8.3)

## 2012-03-09 LAB — LIPID PANEL
Cholesterol: 122 mg/dL (ref 0–200)
HDL: 54.1 mg/dL (ref >39.00)
LDL Cholesterol: 52 mg/dL (ref 0–99)
Total CHOL/HDL Ratio: 2
Triglycerides: 79 mg/dL (ref 0.0–149.0)
VLDL: 15.8 mg/dL (ref 0.0–40.0)

## 2012-03-09 LAB — CBC WITH DIFFERENTIAL/PLATELET
Basophils Absolute: 0.1 10*3/uL (ref 0.0–0.1)
Basophils Relative: 0.7 % (ref 0.0–3.0)
Eosinophils Absolute: 0.2 10*3/uL (ref 0.0–0.7)
Eosinophils Relative: 2 % (ref 0.0–5.0)
HCT: 43.2 % (ref 39.0–52.0)
Hemoglobin: 14.2 g/dL (ref 13.0–17.0)
Lymphocytes Relative: 27.2 % (ref 12.0–46.0)
Lymphs Abs: 2.2 10*3/uL (ref 0.7–4.0)
MCHC: 33 g/dL (ref 30.0–36.0)
MCV: 89.5 fl (ref 78.0–100.0)
Monocytes Absolute: 0.7 10*3/uL (ref 0.1–1.0)
Monocytes Relative: 9.2 % (ref 3.0–12.0)
Neutro Abs: 4.8 10*3/uL (ref 1.4–7.7)
Neutrophils Relative %: 60.9 % (ref 43.0–77.0)
Platelets: 194 10*3/uL (ref 150.0–400.0)
RBC: 4.82 Mil/uL (ref 4.22–5.81)
RDW: 14.6 % (ref 11.5–14.6)
WBC: 7.9 10*3/uL (ref 4.5–10.5)

## 2012-03-09 LAB — MICROALBUMIN / CREATININE URINE RATIO
Creatinine,U: 79.6 mg/dL
Microalb Creat Ratio: 0.5 mg/g (ref 0.0–30.0)
Microalb, Ur: 0.4 mg/dL (ref 0.0–1.9)

## 2012-03-09 LAB — HEMOGLOBIN A1C: Hgb A1c MFr Bld: 6.2 % (ref 4.6–6.5)

## 2012-03-09 LAB — BASIC METABOLIC PANEL
BUN: 11 mg/dL (ref 6–23)
CO2: 29 mEq/L (ref 19–32)
Calcium: 8.8 mg/dL (ref 8.4–10.5)
Chloride: 107 mEq/L (ref 96–112)
Creatinine, Ser: 0.8 mg/dL (ref 0.4–1.5)
GFR: 101.23 mL/min (ref >60.00)
Glucose, Bld: 127 mg/dL — ABNORMAL HIGH (ref 70–99)
Potassium: 3.7 mEq/L (ref 3.5–5.1)
Sodium: 142 mEq/L (ref 135–145)

## 2012-03-09 LAB — TSH: TSH: 2.27 u[IU]/mL (ref 0.35–5.50)

## 2012-03-10 ENCOUNTER — Encounter (HOSPITAL_COMMUNITY): Payer: Self-pay

## 2012-03-10 ENCOUNTER — Telehealth: Payer: Self-pay | Admitting: Internal Medicine

## 2012-03-10 NOTE — Progress Notes (Signed)
Lab only 

## 2012-03-10 NOTE — Telephone Encounter (Signed)
Spoke with pt, aware of lab results. 

## 2012-03-10 NOTE — Telephone Encounter (Signed)
Pt rtn call he thinks to get lab work results

## 2012-03-12 ENCOUNTER — Encounter (HOSPITAL_COMMUNITY): Payer: Self-pay

## 2012-03-13 ENCOUNTER — Encounter (HOSPITAL_COMMUNITY): Payer: Self-pay

## 2012-03-16 ENCOUNTER — Encounter (HOSPITAL_COMMUNITY)
Admission: RE | Admit: 2012-03-16 | Discharge: 2012-03-16 | Disposition: A | Discharge status: Home / Self Care | Payer: Self-pay | Source: Ambulatory Visit | Attending: Internal Medicine | Admitting: Internal Medicine

## 2012-03-17 ENCOUNTER — Encounter (HOSPITAL_COMMUNITY)
Admission: RE | Admit: 2012-03-17 | Discharge: 2012-03-17 | Disposition: A | Discharge status: Home / Self Care | Payer: Self-pay | Source: Ambulatory Visit | Attending: Internal Medicine | Admitting: Internal Medicine

## 2012-03-19 ENCOUNTER — Encounter (HOSPITAL_COMMUNITY)
Admission: RE | Admit: 2012-03-19 | Discharge: 2012-03-19 | Disposition: A | Discharge status: Home / Self Care | Payer: Self-pay | Source: Ambulatory Visit | Attending: Internal Medicine | Admitting: Internal Medicine

## 2012-03-20 ENCOUNTER — Encounter (HOSPITAL_COMMUNITY): Payer: Self-pay

## 2012-03-24 ENCOUNTER — Encounter (HOSPITAL_COMMUNITY): Payer: Self-pay

## 2012-03-25 ENCOUNTER — Other Ambulatory Visit (HOSPITAL_COMMUNITY): Payer: Self-pay | Admitting: Internal Medicine

## 2012-03-25 NOTE — Telephone Encounter (Signed)
..   Requested Prescriptions   Pending Prescriptions Disp Refills  . carvedilol (COREG) 12.5 MG tablet [Pharmacy Med Name: CARVEDILOL   TAB 12.5MG ] 270 tablet 0    Sig: TAKE 1 AND 1/2 TABLETS TWICE DAILY WITH A MEAL  Please call the office to make an appointment

## 2012-03-26 ENCOUNTER — Encounter (HOSPITAL_COMMUNITY)
Admission: RE | Admit: 2012-03-26 | Discharge: 2012-03-26 | Disposition: A | Discharge status: Home / Self Care | Payer: Self-pay | Source: Ambulatory Visit | Attending: Internal Medicine | Admitting: Internal Medicine

## 2012-03-27 ENCOUNTER — Encounter (HOSPITAL_COMMUNITY)
Admission: RE | Admit: 2012-03-27 | Discharge: 2012-03-27 | Disposition: A | Discharge status: Home / Self Care | Payer: Self-pay | Source: Ambulatory Visit | Attending: Internal Medicine | Admitting: Internal Medicine

## 2012-03-31 ENCOUNTER — Encounter (HOSPITAL_COMMUNITY): Payer: Self-pay

## 2012-04-02 ENCOUNTER — Encounter (HOSPITAL_COMMUNITY): Payer: Self-pay

## 2012-04-02 DIAGNOSIS — E785 Hyperlipidemia, unspecified: Secondary | ICD-10-CM | POA: Insufficient documentation

## 2012-04-02 DIAGNOSIS — E8881 Metabolic syndrome: Secondary | ICD-10-CM | POA: Insufficient documentation

## 2012-04-02 DIAGNOSIS — Z9861 Coronary angioplasty status: Secondary | ICD-10-CM | POA: Insufficient documentation

## 2012-04-02 DIAGNOSIS — I10 Essential (primary) hypertension: Secondary | ICD-10-CM | POA: Insufficient documentation

## 2012-04-02 DIAGNOSIS — R0602 Shortness of breath: Secondary | ICD-10-CM | POA: Insufficient documentation

## 2012-04-02 DIAGNOSIS — Z87891 Personal history of nicotine dependence: Secondary | ICD-10-CM | POA: Insufficient documentation

## 2012-04-02 DIAGNOSIS — Z5189 Encounter for other specified aftercare: Secondary | ICD-10-CM | POA: Insufficient documentation

## 2012-04-02 DIAGNOSIS — Z7982 Long term (current) use of aspirin: Secondary | ICD-10-CM | POA: Insufficient documentation

## 2012-04-02 DIAGNOSIS — I2510 Atherosclerotic heart disease of native coronary artery without angina pectoris: Secondary | ICD-10-CM | POA: Insufficient documentation

## 2012-04-02 DIAGNOSIS — E663 Overweight: Secondary | ICD-10-CM | POA: Insufficient documentation

## 2012-04-02 DIAGNOSIS — I209 Angina pectoris, unspecified: Secondary | ICD-10-CM | POA: Insufficient documentation

## 2012-04-03 ENCOUNTER — Encounter (HOSPITAL_COMMUNITY): Payer: Self-pay

## 2012-04-07 ENCOUNTER — Encounter (HOSPITAL_COMMUNITY): Payer: Self-pay

## 2012-04-09 ENCOUNTER — Encounter (HOSPITAL_COMMUNITY)
Admission: RE | Admit: 2012-04-09 | Discharge: 2012-04-09 | Disposition: A | Discharge status: Home / Self Care | Payer: Self-pay | Source: Ambulatory Visit | Attending: Internal Medicine | Admitting: Internal Medicine

## 2012-04-09 ENCOUNTER — Encounter: Payer: Self-pay | Admitting: Cardiology

## 2012-04-09 ENCOUNTER — Ambulatory Visit (INDEPENDENT_AMBULATORY_CARE_PROVIDER_SITE_OTHER): Payer: Medicare Other | Admitting: Cardiology

## 2012-04-09 VITALS — BP 158/76 | HR 56 | Ht 65.0 in | Wt 186.6 lb

## 2012-04-09 DIAGNOSIS — E785 Hyperlipidemia, unspecified: Secondary | ICD-10-CM

## 2012-04-09 DIAGNOSIS — I2510 Atherosclerotic heart disease of native coronary artery without angina pectoris: Secondary | ICD-10-CM | POA: Diagnosis not present

## 2012-04-09 DIAGNOSIS — I10 Essential (primary) hypertension: Secondary | ICD-10-CM | POA: Diagnosis not present

## 2012-04-09 MED ORDER — NITROGLYCERIN 0.4 MG SL SUBL: 0.4000 mg | SUBLINGUAL_TABLET | SUBLINGUAL | Status: DC | PRN

## 2012-04-09 MED ORDER — CLOPIDOGREL BISULFATE 75 MG PO TABS: 75.0000 mg | ORAL_TABLET | Freq: Every day | ORAL | Status: DC

## 2012-04-09 NOTE — Assessment & Plan Note (Signed)
Blood pressure is mildly elevated today but has been normal at cardiac rehabilitation. I made no changes in his current therapy.

## 2012-04-09 NOTE — Progress Notes (Signed)
Jacob Curry Date of Birth: July 22, 1941 Medical Record #[id number]  History of Present Illness: Jacob Curry is a 71 year old white male seen to establish cardiac care. He is a former patient of Dr. Gala Curry. He has a known history of coronary disease with cardiac catheterization in May of 2011 showing total occlusion of a large left circumflex vessel with good left to left collaterals. Ejection fraction was 50-55%. He has been managed medically. He has a history of hypertension, hyperlipidemia, and diabetes. He has a history of moderate obstructive sleep apnea. This is managed with an oral appliance. He was unable to tolerate CPAP therapy. On followup today he is doing very well. He denies any chest pain, shortness of breath, or palpitations. He continues to go to cardiac rehabilitation 3 days a week if he is in town. He is currently building a cabin in [place]. He was treated for pneumonia earlier this spring.  Current Outpatient Prescriptions on File Prior to Visit  Medication Sig Dispense Refill  . aspirin 81 MG tablet Take 81 mg by mouth daily.        . carvedilol (COREG) 12.5 MG tablet TAKE 1 AND 1/2 TABLETS TWICE DAILY WITH A MEAL  270 tablet  0  . fish oil-omega-3 fatty acids 1000 MG capsule Take 2 g by mouth daily.        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 lisinopril (PRINIVIL,ZESTRIL) 20 MG tablet TAKE 2 TABLETS ONE TIME DAILY  60 tablet  6  . metFORMIN (GLUCOPHAGE) 500 MG tablet TAKE ONE TABLET BY MOUTH TWO TIMES A DAY WITH 2 LARGEST MEALS  180 tablet  2  . niacin (NIASPAN) 500 MG CR tablet Take 2 tablets (1,000 mg total) by mouth at bedtime.  90 tablet  3  . Saw Palmetto, Serenoa repens, (SAW PALMETTO PO) Take 1 tablet by mouth daily.        . simvastatin (ZOCOR) 40 MG tablet TAKE 1 TABLET EVERY EVENING  90 tablet  1  . DISCONTD: lisinopril (PRINIVIL,ZESTRIL) 40 MG tablet Take 40 mg by  mouth daily.      Marland Kitchen DISCONTD: nitroGLYCERIN (NITROSTAT) 0.4 MG SL tablet Place 1 tablet (0.4 mg total) under the tongue every 5 (five) minutes as needed.  25 tablet  prn    Allergies  Allergen Reactions  . Acetaminophen     Drug induced hepatitis  . Oxycodone-Acetaminophen     Drug induced hepatitis    Past Medical History  Diagnosis Date  . CAD (coronary artery disease)     Dr Jacob Curry  . HTN (hypertension)   . HLD (hyperlipidemia)   . DM (diabetes mellitus)   . ED (erectile dysfunction)   . Carotid stenosis   . Obesity   . OSA (obstructive sleep apnea)     oral appliance, Dr Jacob Curry    Past Surgical History  Procedure Date  . Appendectomy 1958  . Ankle fracture surgery 1993  . Hand surgery 1965  . Tonsillectomy   . Cardiac catheterization 2007 & 2011  . Colonoscopy 2002    negative; Dr Jacob Curry    History  Smoking status  . Former Smoker -- 1.0 packs/day  . Quit date: 09/02/1990  Smokeless tobacco  . Never Used  Comment: onset age [age] up to 1 ppd    History  Alcohol Use No    Family History  Problem Relation Age of Onset  .  Diabetes Mother   . Colon cancer Paternal Uncle   . Heart attack Father      4 vessel CBAG @ 88  . Diabetes Brother   . Stroke Neg Hx     Review of Systems: As noted in history of present illness.  All other systems were reviewed and are negative.  Physical Exam: BP 158/76  Pulse 56  Ht 5\' 5"  (1.651 m)  Wt 84.641 kg (186 lb 9.6 oz)  BMI 31.05 kg/m2 He is a pleasant white male in no acute distress. HEENT exam is unremarkable. He has no jugular venous distention or bruits. There is no adenopathy or thyromegaly. Cardiac exam reveals a regular rate and rhythm without gallop, murmur, or click. PMI is normal. Lungs are clear. Abdomen is soft and nontender without masses or bruits. He has normal bowel sounds. Extremities are without cyanosis or edema. Pedal pulses are 2+ and symmetric. He is alert and oriented x3. Cranial nerves II  through XII are intact. He has no focal findings. LABORATORY DATA: ECG demonstrates sinus bradycardia with a rate of 56 beats per minute. It is otherwise normal. His most recent blood work on July 8 showed a hemoglobin A1c of 6.2%. Complete chemistry panel was normal. Total cholesterol 122, triglycerides 79, HDL 54, LDL 52. TSH and CBC were normal. Assessment / Plan:

## 2012-04-09 NOTE — Patient Instructions (Signed)
Continue your current therapy.   Keep up your exercise  I will see you again in 6 months

## 2012-04-09 NOTE — Assessment & Plan Note (Signed)
He has known occlusion of the left circumflex coronary with good collateral flow. He is asymptomatic on medical therapy. We will continue with his medical treatment and risk factor modification. Have refilled his Plavix and sublingual nitroglycerin today. I'll followup again in 6 months. He is to report if he has any anginal symptoms.

## 2012-04-09 NOTE — Assessment & Plan Note (Signed)
Lipids are in excellent control on combination of niacin and simvastatin.

## 2012-04-10 ENCOUNTER — Encounter (HOSPITAL_COMMUNITY)
Admission: RE | Admit: 2012-04-10 | Discharge: 2012-04-10 | Disposition: A | Discharge status: Home / Self Care | Payer: Self-pay | Source: Ambulatory Visit | Attending: Internal Medicine | Admitting: Internal Medicine

## 2012-04-14 ENCOUNTER — Encounter (HOSPITAL_COMMUNITY)
Admission: RE | Admit: 2012-04-14 | Discharge: 2012-04-14 | Disposition: A | Discharge status: Home / Self Care | Payer: Self-pay | Source: Ambulatory Visit | Attending: Internal Medicine | Admitting: Internal Medicine

## 2012-04-16 ENCOUNTER — Encounter (HOSPITAL_COMMUNITY): Payer: Self-pay

## 2012-04-17 ENCOUNTER — Encounter (HOSPITAL_COMMUNITY): Payer: Self-pay

## 2012-04-21 ENCOUNTER — Encounter (HOSPITAL_COMMUNITY): Payer: Self-pay

## 2012-04-23 ENCOUNTER — Encounter (HOSPITAL_COMMUNITY)
Admission: RE | Admit: 2012-04-23 | Discharge: 2012-04-23 | Disposition: A | Discharge status: Home / Self Care | Payer: Self-pay | Source: Ambulatory Visit | Attending: Internal Medicine | Admitting: Internal Medicine

## 2012-04-24 ENCOUNTER — Encounter (HOSPITAL_COMMUNITY)
Admission: RE | Admit: 2012-04-24 | Discharge: 2012-04-24 | Disposition: A | Discharge status: Home / Self Care | Payer: Self-pay | Source: Ambulatory Visit | Attending: Internal Medicine | Admitting: Internal Medicine

## 2012-04-28 ENCOUNTER — Encounter (HOSPITAL_COMMUNITY): Payer: Self-pay

## 2012-04-30 ENCOUNTER — Encounter (HOSPITAL_COMMUNITY): Payer: Self-pay

## 2012-05-01 ENCOUNTER — Encounter (HOSPITAL_COMMUNITY)
Admission: RE | Admit: 2012-05-01 | Discharge: 2012-05-01 | Disposition: A | Discharge status: Home / Self Care | Payer: Self-pay | Source: Ambulatory Visit | Attending: Internal Medicine | Admitting: Internal Medicine

## 2012-05-05 ENCOUNTER — Encounter (HOSPITAL_COMMUNITY): Payer: Self-pay

## 2012-05-05 DIAGNOSIS — I209 Angina pectoris, unspecified: Secondary | ICD-10-CM | POA: Insufficient documentation

## 2012-05-05 DIAGNOSIS — R0602 Shortness of breath: Secondary | ICD-10-CM | POA: Insufficient documentation

## 2012-05-05 DIAGNOSIS — Z9861 Coronary angioplasty status: Secondary | ICD-10-CM | POA: Insufficient documentation

## 2012-05-05 DIAGNOSIS — E785 Hyperlipidemia, unspecified: Secondary | ICD-10-CM | POA: Insufficient documentation

## 2012-05-05 DIAGNOSIS — E8881 Metabolic syndrome: Secondary | ICD-10-CM | POA: Insufficient documentation

## 2012-05-05 DIAGNOSIS — I10 Essential (primary) hypertension: Secondary | ICD-10-CM | POA: Insufficient documentation

## 2012-05-05 DIAGNOSIS — Z7982 Long term (current) use of aspirin: Secondary | ICD-10-CM | POA: Insufficient documentation

## 2012-05-05 DIAGNOSIS — Z5189 Encounter for other specified aftercare: Secondary | ICD-10-CM | POA: Insufficient documentation

## 2012-05-05 DIAGNOSIS — E663 Overweight: Secondary | ICD-10-CM | POA: Insufficient documentation

## 2012-05-05 DIAGNOSIS — I2510 Atherosclerotic heart disease of native coronary artery without angina pectoris: Secondary | ICD-10-CM | POA: Insufficient documentation

## 2012-05-05 DIAGNOSIS — Z87891 Personal history of nicotine dependence: Secondary | ICD-10-CM | POA: Insufficient documentation

## 2012-05-07 ENCOUNTER — Encounter (HOSPITAL_COMMUNITY): Payer: Self-pay

## 2012-05-08 ENCOUNTER — Encounter (HOSPITAL_COMMUNITY): Payer: Self-pay

## 2012-05-12 ENCOUNTER — Encounter (HOSPITAL_COMMUNITY): Payer: Self-pay

## 2012-05-14 ENCOUNTER — Encounter (HOSPITAL_COMMUNITY): Payer: Self-pay

## 2012-05-15 ENCOUNTER — Encounter (HOSPITAL_COMMUNITY)
Admission: RE | Admit: 2012-05-15 | Discharge: 2012-05-15 | Disposition: A | Discharge status: Home / Self Care | Payer: Self-pay | Source: Ambulatory Visit | Attending: Internal Medicine | Admitting: Internal Medicine

## 2012-05-19 ENCOUNTER — Encounter (HOSPITAL_COMMUNITY): Payer: Self-pay

## 2012-05-21 ENCOUNTER — Encounter (HOSPITAL_COMMUNITY): Payer: Self-pay

## 2012-05-21 ENCOUNTER — Encounter: Payer: Self-pay | Admitting: Internal Medicine

## 2012-05-22 ENCOUNTER — Encounter (HOSPITAL_COMMUNITY)
Admission: RE | Admit: 2012-05-22 | Discharge: 2012-05-22 | Disposition: A | Discharge status: Home / Self Care | Payer: Self-pay | Source: Ambulatory Visit | Attending: Internal Medicine | Admitting: Internal Medicine

## 2012-05-25 ENCOUNTER — Encounter (HOSPITAL_COMMUNITY)
Admission: RE | Admit: 2012-05-25 | Discharge: 2012-05-25 | Disposition: A | Discharge status: Home / Self Care | Payer: Self-pay | Source: Ambulatory Visit | Attending: Internal Medicine | Admitting: Internal Medicine

## 2012-05-26 ENCOUNTER — Encounter (HOSPITAL_COMMUNITY): Payer: Self-pay

## 2012-05-28 ENCOUNTER — Encounter (HOSPITAL_COMMUNITY): Payer: Self-pay

## 2012-05-29 ENCOUNTER — Encounter (HOSPITAL_COMMUNITY): Payer: Self-pay

## 2012-06-02 ENCOUNTER — Encounter (HOSPITAL_COMMUNITY)
Admission: RE | Admit: 2012-06-02 | Discharge: 2012-06-02 | Disposition: A | Discharge status: Home / Self Care | Payer: Self-pay | Source: Ambulatory Visit | Attending: Internal Medicine | Admitting: Internal Medicine

## 2012-06-02 DIAGNOSIS — I2510 Atherosclerotic heart disease of native coronary artery without angina pectoris: Secondary | ICD-10-CM | POA: Insufficient documentation

## 2012-06-02 DIAGNOSIS — R0602 Shortness of breath: Secondary | ICD-10-CM | POA: Insufficient documentation

## 2012-06-02 DIAGNOSIS — Z87891 Personal history of nicotine dependence: Secondary | ICD-10-CM | POA: Insufficient documentation

## 2012-06-02 DIAGNOSIS — E785 Hyperlipidemia, unspecified: Secondary | ICD-10-CM | POA: Insufficient documentation

## 2012-06-02 DIAGNOSIS — E8881 Metabolic syndrome: Secondary | ICD-10-CM | POA: Insufficient documentation

## 2012-06-02 DIAGNOSIS — E663 Overweight: Secondary | ICD-10-CM | POA: Insufficient documentation

## 2012-06-02 DIAGNOSIS — Z9861 Coronary angioplasty status: Secondary | ICD-10-CM | POA: Insufficient documentation

## 2012-06-02 DIAGNOSIS — I209 Angina pectoris, unspecified: Secondary | ICD-10-CM | POA: Insufficient documentation

## 2012-06-02 DIAGNOSIS — Z5189 Encounter for other specified aftercare: Secondary | ICD-10-CM | POA: Insufficient documentation

## 2012-06-02 DIAGNOSIS — Z7982 Long term (current) use of aspirin: Secondary | ICD-10-CM | POA: Insufficient documentation

## 2012-06-02 DIAGNOSIS — I10 Essential (primary) hypertension: Secondary | ICD-10-CM | POA: Insufficient documentation

## 2012-06-03 ENCOUNTER — Other Ambulatory Visit: Payer: Self-pay

## 2012-06-03 MED ORDER — LISINOPRIL 20 MG PO TABS: 20.0000 mg | ORAL_TABLET | Freq: Two times a day (BID) | ORAL | Status: DC

## 2012-06-04 ENCOUNTER — Other Ambulatory Visit: Payer: Self-pay | Admitting: Cardiology

## 2012-06-04 ENCOUNTER — Other Ambulatory Visit: Payer: Self-pay | Admitting: Internal Medicine

## 2012-06-04 ENCOUNTER — Encounter (HOSPITAL_COMMUNITY)
Admission: RE | Admit: 2012-06-04 | Discharge: 2012-06-04 | Disposition: A | Discharge status: Home / Self Care | Payer: Self-pay | Source: Ambulatory Visit | Attending: Internal Medicine | Admitting: Internal Medicine

## 2012-06-04 MED ORDER — CARVEDILOL 12.5 MG PO TABS: ORAL_TABLET | ORAL | Status: DC

## 2012-06-04 MED ORDER — LISINOPRIL 20 MG PO TABS: 20.0000 mg | ORAL_TABLET | Freq: Two times a day (BID) | ORAL | Status: DC

## 2012-06-05 ENCOUNTER — Encounter (HOSPITAL_COMMUNITY): Payer: Self-pay

## 2012-06-09 ENCOUNTER — Encounter (HOSPITAL_COMMUNITY): Payer: Self-pay

## 2012-06-10 ENCOUNTER — Encounter (HOSPITAL_COMMUNITY)
Admission: RE | Admit: 2012-06-10 | Discharge: 2012-06-10 | Disposition: A | Discharge status: Home / Self Care | Payer: Self-pay | Source: Ambulatory Visit | Attending: Cardiology | Admitting: Cardiology

## 2012-06-10 ENCOUNTER — Other Ambulatory Visit: Payer: Self-pay

## 2012-06-10 MED ORDER — CARVEDILOL 12.5 MG PO TABS: ORAL_TABLET | ORAL | Status: DC

## 2012-06-11 ENCOUNTER — Encounter (HOSPITAL_COMMUNITY): Payer: Self-pay

## 2012-06-12 ENCOUNTER — Encounter (HOSPITAL_COMMUNITY)
Admission: RE | Admit: 2012-06-12 | Discharge: 2012-06-12 | Disposition: A | Discharge status: Home / Self Care | Payer: Self-pay | Source: Ambulatory Visit | Attending: Internal Medicine | Admitting: Internal Medicine

## 2012-06-16 ENCOUNTER — Encounter (HOSPITAL_COMMUNITY): Payer: Self-pay

## 2012-06-18 ENCOUNTER — Encounter (HOSPITAL_COMMUNITY): Payer: Self-pay

## 2012-06-19 ENCOUNTER — Other Ambulatory Visit: Payer: Self-pay

## 2012-06-19 ENCOUNTER — Encounter (HOSPITAL_COMMUNITY)
Admission: RE | Admit: 2012-06-19 | Discharge: 2012-06-19 | Disposition: A | Discharge status: Home / Self Care | Payer: Self-pay | Source: Ambulatory Visit | Attending: Internal Medicine | Admitting: Internal Medicine

## 2012-06-19 MED ORDER — LISINOPRIL 20 MG PO TABS: 20.0000 mg | ORAL_TABLET | Freq: Two times a day (BID) | ORAL | Status: DC

## 2012-06-23 ENCOUNTER — Encounter (HOSPITAL_COMMUNITY)
Admission: RE | Admit: 2012-06-23 | Discharge: 2012-06-23 | Disposition: A | Discharge status: Home / Self Care | Payer: Self-pay | Source: Ambulatory Visit | Attending: Internal Medicine | Admitting: Internal Medicine

## 2012-06-25 ENCOUNTER — Encounter (HOSPITAL_COMMUNITY): Payer: Self-pay

## 2012-06-26 ENCOUNTER — Encounter (HOSPITAL_COMMUNITY): Payer: Self-pay

## 2012-06-30 ENCOUNTER — Encounter (HOSPITAL_COMMUNITY)
Admission: RE | Admit: 2012-06-30 | Discharge: 2012-06-30 | Disposition: A | Discharge status: Home / Self Care | Payer: Self-pay | Source: Ambulatory Visit | Attending: Internal Medicine | Admitting: Internal Medicine

## 2012-07-02 ENCOUNTER — Encounter (HOSPITAL_COMMUNITY)
Admission: RE | Admit: 2012-07-02 | Discharge: 2012-07-02 | Disposition: A | Discharge status: Home / Self Care | Payer: Self-pay | Source: Ambulatory Visit | Attending: Internal Medicine | Admitting: Internal Medicine

## 2012-07-03 ENCOUNTER — Encounter (HOSPITAL_COMMUNITY): Payer: Self-pay

## 2012-07-03 DIAGNOSIS — E785 Hyperlipidemia, unspecified: Secondary | ICD-10-CM | POA: Insufficient documentation

## 2012-07-03 DIAGNOSIS — I2510 Atherosclerotic heart disease of native coronary artery without angina pectoris: Secondary | ICD-10-CM | POA: Insufficient documentation

## 2012-07-03 DIAGNOSIS — Z5189 Encounter for other specified aftercare: Secondary | ICD-10-CM | POA: Insufficient documentation

## 2012-07-03 DIAGNOSIS — Z7982 Long term (current) use of aspirin: Secondary | ICD-10-CM | POA: Insufficient documentation

## 2012-07-03 DIAGNOSIS — R0602 Shortness of breath: Secondary | ICD-10-CM | POA: Insufficient documentation

## 2012-07-03 DIAGNOSIS — I10 Essential (primary) hypertension: Secondary | ICD-10-CM | POA: Insufficient documentation

## 2012-07-03 DIAGNOSIS — Z9861 Coronary angioplasty status: Secondary | ICD-10-CM | POA: Insufficient documentation

## 2012-07-03 DIAGNOSIS — E8881 Metabolic syndrome: Secondary | ICD-10-CM | POA: Insufficient documentation

## 2012-07-03 DIAGNOSIS — Z87891 Personal history of nicotine dependence: Secondary | ICD-10-CM | POA: Insufficient documentation

## 2012-07-03 DIAGNOSIS — E663 Overweight: Secondary | ICD-10-CM | POA: Insufficient documentation

## 2012-07-03 DIAGNOSIS — I209 Angina pectoris, unspecified: Secondary | ICD-10-CM | POA: Insufficient documentation

## 2012-07-07 ENCOUNTER — Encounter (HOSPITAL_COMMUNITY): Payer: Self-pay

## 2012-07-09 ENCOUNTER — Other Ambulatory Visit: Payer: Self-pay | Admitting: Internal Medicine

## 2012-07-09 ENCOUNTER — Encounter (HOSPITAL_COMMUNITY)
Admission: RE | Admit: 2012-07-09 | Discharge: 2012-07-09 | Disposition: A | Discharge status: Home / Self Care | Payer: Self-pay | Source: Ambulatory Visit | Attending: Internal Medicine | Admitting: Internal Medicine

## 2012-07-10 ENCOUNTER — Encounter (HOSPITAL_COMMUNITY)
Admission: RE | Admit: 2012-07-10 | Discharge: 2012-07-10 | Disposition: A | Discharge status: Home / Self Care | Payer: Self-pay | Source: Ambulatory Visit | Attending: Internal Medicine | Admitting: Internal Medicine

## 2012-07-10 DIAGNOSIS — Z23 Encounter for immunization: Secondary | ICD-10-CM | POA: Diagnosis not present

## 2012-07-14 ENCOUNTER — Encounter (HOSPITAL_COMMUNITY): Payer: Self-pay

## 2012-07-16 ENCOUNTER — Encounter (HOSPITAL_COMMUNITY)
Admission: RE | Admit: 2012-07-16 | Discharge: 2012-07-16 | Disposition: A | Discharge status: Home / Self Care | Payer: Self-pay | Source: Ambulatory Visit | Attending: Internal Medicine | Admitting: Internal Medicine

## 2012-07-17 ENCOUNTER — Encounter (HOSPITAL_COMMUNITY)
Admission: RE | Admit: 2012-07-17 | Discharge: 2012-07-17 | Disposition: A | Discharge status: Home / Self Care | Payer: Self-pay | Source: Ambulatory Visit | Attending: Internal Medicine | Admitting: Internal Medicine

## 2012-07-21 ENCOUNTER — Encounter (HOSPITAL_COMMUNITY): Payer: Self-pay

## 2012-07-23 ENCOUNTER — Encounter (HOSPITAL_COMMUNITY): Payer: Self-pay

## 2012-07-24 ENCOUNTER — Encounter (HOSPITAL_COMMUNITY)
Admission: RE | Admit: 2012-07-24 | Discharge: 2012-07-24 | Disposition: A | Discharge status: Home / Self Care | Payer: Self-pay | Source: Ambulatory Visit | Attending: Internal Medicine | Admitting: Internal Medicine

## 2012-07-28 ENCOUNTER — Encounter (HOSPITAL_COMMUNITY)
Admission: RE | Admit: 2012-07-28 | Discharge: 2012-07-28 | Disposition: A | Discharge status: Home / Self Care | Payer: Self-pay | Source: Ambulatory Visit | Attending: Internal Medicine | Admitting: Internal Medicine

## 2012-07-29 ENCOUNTER — Encounter (HOSPITAL_COMMUNITY)
Admission: RE | Admit: 2012-07-29 | Discharge: 2012-07-29 | Disposition: A | Discharge status: Home / Self Care | Payer: Self-pay | Source: Ambulatory Visit | Attending: Internal Medicine | Admitting: Internal Medicine

## 2012-07-30 ENCOUNTER — Encounter (HOSPITAL_COMMUNITY): Payer: Self-pay

## 2012-07-31 ENCOUNTER — Encounter (HOSPITAL_COMMUNITY): Payer: Self-pay

## 2012-08-04 ENCOUNTER — Encounter (HOSPITAL_COMMUNITY): Payer: Self-pay

## 2012-08-04 DIAGNOSIS — E8881 Metabolic syndrome: Secondary | ICD-10-CM | POA: Insufficient documentation

## 2012-08-04 DIAGNOSIS — R0602 Shortness of breath: Secondary | ICD-10-CM | POA: Insufficient documentation

## 2012-08-04 DIAGNOSIS — E785 Hyperlipidemia, unspecified: Secondary | ICD-10-CM | POA: Insufficient documentation

## 2012-08-04 DIAGNOSIS — Z87891 Personal history of nicotine dependence: Secondary | ICD-10-CM | POA: Insufficient documentation

## 2012-08-04 DIAGNOSIS — Z7982 Long term (current) use of aspirin: Secondary | ICD-10-CM | POA: Insufficient documentation

## 2012-08-04 DIAGNOSIS — E663 Overweight: Secondary | ICD-10-CM | POA: Insufficient documentation

## 2012-08-04 DIAGNOSIS — I10 Essential (primary) hypertension: Secondary | ICD-10-CM | POA: Insufficient documentation

## 2012-08-04 DIAGNOSIS — Z5189 Encounter for other specified aftercare: Secondary | ICD-10-CM | POA: Insufficient documentation

## 2012-08-04 DIAGNOSIS — I209 Angina pectoris, unspecified: Secondary | ICD-10-CM | POA: Insufficient documentation

## 2012-08-04 DIAGNOSIS — I2510 Atherosclerotic heart disease of native coronary artery without angina pectoris: Secondary | ICD-10-CM | POA: Insufficient documentation

## 2012-08-04 DIAGNOSIS — Z9861 Coronary angioplasty status: Secondary | ICD-10-CM | POA: Insufficient documentation

## 2012-08-06 ENCOUNTER — Encounter (HOSPITAL_COMMUNITY): Payer: Self-pay

## 2012-08-07 ENCOUNTER — Encounter (HOSPITAL_COMMUNITY): Payer: Self-pay

## 2012-08-11 ENCOUNTER — Encounter (HOSPITAL_COMMUNITY)
Admission: RE | Admit: 2012-08-11 | Discharge: 2012-08-11 | Disposition: A | Discharge status: Home / Self Care | Payer: Self-pay | Source: Ambulatory Visit | Attending: Internal Medicine | Admitting: Internal Medicine

## 2012-08-13 ENCOUNTER — Encounter (HOSPITAL_COMMUNITY)
Admission: RE | Admit: 2012-08-13 | Discharge: 2012-08-13 | Disposition: A | Discharge status: Home / Self Care | Payer: Self-pay | Source: Ambulatory Visit | Attending: Internal Medicine | Admitting: Internal Medicine

## 2012-08-14 ENCOUNTER — Encounter (HOSPITAL_COMMUNITY)
Admission: RE | Admit: 2012-08-14 | Discharge: 2012-08-14 | Disposition: A | Discharge status: Home / Self Care | Payer: Self-pay | Source: Ambulatory Visit | Attending: Internal Medicine | Admitting: Internal Medicine

## 2012-08-18 ENCOUNTER — Encounter (HOSPITAL_COMMUNITY): Payer: Self-pay

## 2012-08-20 ENCOUNTER — Encounter (HOSPITAL_COMMUNITY): Payer: Self-pay

## 2012-08-21 ENCOUNTER — Encounter (HOSPITAL_COMMUNITY)
Admission: RE | Admit: 2012-08-21 | Discharge: 2012-08-21 | Disposition: A | Discharge status: Home / Self Care | Payer: Self-pay | Source: Ambulatory Visit | Attending: Internal Medicine | Admitting: Internal Medicine

## 2012-08-25 ENCOUNTER — Encounter (HOSPITAL_COMMUNITY)
Admission: RE | Admit: 2012-08-25 | Discharge: 2012-08-25 | Disposition: A | Discharge status: Home / Self Care | Payer: Self-pay | Source: Ambulatory Visit | Attending: Internal Medicine | Admitting: Internal Medicine

## 2012-08-27 ENCOUNTER — Encounter (HOSPITAL_COMMUNITY)
Admission: RE | Admit: 2012-08-27 | Discharge: 2012-08-27 | Disposition: A | Discharge status: Home / Self Care | Payer: Self-pay | Source: Ambulatory Visit | Attending: Internal Medicine | Admitting: Internal Medicine

## 2012-08-28 ENCOUNTER — Encounter (HOSPITAL_COMMUNITY)
Admission: RE | Admit: 2012-08-28 | Discharge: 2012-08-28 | Disposition: A | Discharge status: Home / Self Care | Payer: Self-pay | Source: Ambulatory Visit | Attending: Internal Medicine | Admitting: Internal Medicine

## 2012-09-01 ENCOUNTER — Encounter (HOSPITAL_COMMUNITY): Payer: Self-pay

## 2012-09-03 ENCOUNTER — Encounter (HOSPITAL_COMMUNITY)
Admission: RE | Admit: 2012-09-03 | Discharge: 2012-09-03 | Disposition: A | Discharge status: Home / Self Care | Payer: Self-pay | Source: Ambulatory Visit | Attending: Internal Medicine | Admitting: Internal Medicine

## 2012-09-03 DIAGNOSIS — R0602 Shortness of breath: Secondary | ICD-10-CM | POA: Insufficient documentation

## 2012-09-03 DIAGNOSIS — Z9861 Coronary angioplasty status: Secondary | ICD-10-CM | POA: Insufficient documentation

## 2012-09-03 DIAGNOSIS — I209 Angina pectoris, unspecified: Secondary | ICD-10-CM | POA: Insufficient documentation

## 2012-09-03 DIAGNOSIS — Z7982 Long term (current) use of aspirin: Secondary | ICD-10-CM | POA: Insufficient documentation

## 2012-09-03 DIAGNOSIS — Z87891 Personal history of nicotine dependence: Secondary | ICD-10-CM | POA: Insufficient documentation

## 2012-09-03 DIAGNOSIS — I2510 Atherosclerotic heart disease of native coronary artery without angina pectoris: Secondary | ICD-10-CM | POA: Insufficient documentation

## 2012-09-03 DIAGNOSIS — E8881 Metabolic syndrome: Secondary | ICD-10-CM | POA: Insufficient documentation

## 2012-09-03 DIAGNOSIS — E663 Overweight: Secondary | ICD-10-CM | POA: Insufficient documentation

## 2012-09-03 DIAGNOSIS — E785 Hyperlipidemia, unspecified: Secondary | ICD-10-CM | POA: Insufficient documentation

## 2012-09-03 DIAGNOSIS — I10 Essential (primary) hypertension: Secondary | ICD-10-CM | POA: Insufficient documentation

## 2012-09-03 DIAGNOSIS — Z5189 Encounter for other specified aftercare: Secondary | ICD-10-CM | POA: Insufficient documentation

## 2012-09-04 ENCOUNTER — Encounter (HOSPITAL_COMMUNITY)
Admission: RE | Admit: 2012-09-04 | Discharge: 2012-09-04 | Disposition: A | Discharge status: Home / Self Care | Payer: Self-pay | Source: Ambulatory Visit | Attending: Internal Medicine | Admitting: Internal Medicine

## 2012-09-08 ENCOUNTER — Encounter (HOSPITAL_COMMUNITY)
Admission: RE | Admit: 2012-09-08 | Discharge: 2012-09-08 | Disposition: A | Discharge status: Home / Self Care | Payer: Self-pay | Source: Ambulatory Visit | Attending: Internal Medicine | Admitting: Internal Medicine

## 2012-09-09 ENCOUNTER — Other Ambulatory Visit: Payer: Self-pay | Admitting: *Deleted

## 2012-09-09 MED ORDER — LISINOPRIL 20 MG PO TABS: 20.0000 mg | ORAL_TABLET | Freq: Two times a day (BID) | ORAL | Status: DC

## 2012-09-09 MED ORDER — SIMVASTATIN 40 MG PO TABS: 20.0000 mg | ORAL_TABLET | Freq: Every day | ORAL | Status: DC

## 2012-09-10 ENCOUNTER — Encounter (HOSPITAL_COMMUNITY)
Admission: RE | Admit: 2012-09-10 | Discharge: 2012-09-10 | Disposition: A | Discharge status: Home / Self Care | Payer: Self-pay | Source: Ambulatory Visit | Attending: Internal Medicine | Admitting: Internal Medicine

## 2012-09-11 ENCOUNTER — Encounter (HOSPITAL_COMMUNITY)
Admission: RE | Admit: 2012-09-11 | Discharge: 2012-09-11 | Disposition: A | Discharge status: Home / Self Care | Payer: Self-pay | Source: Ambulatory Visit | Attending: Internal Medicine | Admitting: Internal Medicine

## 2012-09-11 DIAGNOSIS — N433 Hydrocele, unspecified: Secondary | ICD-10-CM | POA: Diagnosis not present

## 2012-09-11 DIAGNOSIS — N529 Male erectile dysfunction, unspecified: Secondary | ICD-10-CM | POA: Diagnosis not present

## 2012-09-11 DIAGNOSIS — N320 Bladder-neck obstruction: Secondary | ICD-10-CM | POA: Diagnosis not present

## 2012-09-15 ENCOUNTER — Encounter (HOSPITAL_COMMUNITY): Payer: Self-pay

## 2012-09-16 ENCOUNTER — Telehealth: Payer: Self-pay

## 2012-09-16 DIAGNOSIS — E785 Hyperlipidemia, unspecified: Secondary | ICD-10-CM

## 2012-09-16 DIAGNOSIS — I10 Essential (primary) hypertension: Secondary | ICD-10-CM

## 2012-09-16 DIAGNOSIS — E119 Type 2 diabetes mellitus without complications: Secondary | ICD-10-CM

## 2012-09-16 DIAGNOSIS — E8881 Metabolic syndrome: Secondary | ICD-10-CM

## 2012-09-16 MED ORDER — LISINOPRIL 20 MG PO TABS: 20.0000 mg | ORAL_TABLET | Freq: Two times a day (BID) | ORAL | Status: DC

## 2012-09-16 MED ORDER — SIMVASTATIN 40 MG PO TABS: 40.0000 mg | ORAL_TABLET | Freq: Every day | ORAL | Status: DC

## 2012-09-16 NOTE — Telephone Encounter (Signed)
Patient walked in office this morning 09/15/12 at 11:00 am.When I went to call patient back he had already left.Patient was called he stated he needed refills on simvastatin and lisinopril.Refills sent to [facility] day supply.Patient also stated he would like to have fasting lab work before he sees Dr.Jordan 10/22/12.Appointment scheduled for lab work 10/19/12.

## 2012-09-17 ENCOUNTER — Encounter (HOSPITAL_COMMUNITY)
Admission: RE | Admit: 2012-09-17 | Discharge: 2012-09-17 | Disposition: A | Discharge status: Home / Self Care | Payer: Self-pay | Source: Ambulatory Visit | Attending: Internal Medicine | Admitting: Internal Medicine

## 2012-09-18 ENCOUNTER — Encounter (HOSPITAL_COMMUNITY)
Admission: RE | Admit: 2012-09-18 | Discharge: 2012-09-18 | Disposition: A | Discharge status: Home / Self Care | Payer: Self-pay | Source: Ambulatory Visit | Attending: Internal Medicine | Admitting: Internal Medicine

## 2012-09-22 ENCOUNTER — Encounter (HOSPITAL_COMMUNITY)
Admission: RE | Admit: 2012-09-22 | Discharge: 2012-09-22 | Disposition: A | Discharge status: Home / Self Care | Payer: Self-pay | Source: Ambulatory Visit | Attending: Internal Medicine | Admitting: Internal Medicine

## 2012-09-24 ENCOUNTER — Encounter (HOSPITAL_COMMUNITY)
Admission: RE | Admit: 2012-09-24 | Discharge: 2012-09-24 | Disposition: A | Discharge status: Home / Self Care | Payer: Self-pay | Source: Ambulatory Visit | Attending: Internal Medicine | Admitting: Internal Medicine

## 2012-09-25 ENCOUNTER — Encounter (HOSPITAL_COMMUNITY)
Admission: RE | Admit: 2012-09-25 | Discharge: 2012-09-25 | Disposition: A | Discharge status: Home / Self Care | Payer: Self-pay | Source: Ambulatory Visit | Attending: Internal Medicine | Admitting: Internal Medicine

## 2012-09-29 ENCOUNTER — Encounter (HOSPITAL_COMMUNITY)
Admission: RE | Admit: 2012-09-29 | Discharge: 2012-09-29 | Disposition: A | Discharge status: Home / Self Care | Payer: Self-pay | Source: Ambulatory Visit | Attending: Internal Medicine | Admitting: Internal Medicine

## 2012-10-01 ENCOUNTER — Encounter (HOSPITAL_COMMUNITY)
Admission: RE | Admit: 2012-10-01 | Discharge: 2012-10-01 | Disposition: A | Discharge status: Home / Self Care | Payer: Self-pay | Source: Ambulatory Visit | Attending: Internal Medicine | Admitting: Internal Medicine

## 2012-10-02 ENCOUNTER — Encounter (HOSPITAL_COMMUNITY)
Admission: RE | Admit: 2012-10-02 | Discharge: 2012-10-02 | Disposition: A | Discharge status: Home / Self Care | Payer: Self-pay | Source: Ambulatory Visit | Attending: Internal Medicine | Admitting: Internal Medicine

## 2012-10-06 ENCOUNTER — Encounter (HOSPITAL_COMMUNITY): Payer: Self-pay

## 2012-10-06 DIAGNOSIS — R0602 Shortness of breath: Secondary | ICD-10-CM | POA: Insufficient documentation

## 2012-10-06 DIAGNOSIS — Z9861 Coronary angioplasty status: Secondary | ICD-10-CM | POA: Insufficient documentation

## 2012-10-06 DIAGNOSIS — E785 Hyperlipidemia, unspecified: Secondary | ICD-10-CM | POA: Insufficient documentation

## 2012-10-06 DIAGNOSIS — Z5189 Encounter for other specified aftercare: Secondary | ICD-10-CM | POA: Insufficient documentation

## 2012-10-06 DIAGNOSIS — Z7982 Long term (current) use of aspirin: Secondary | ICD-10-CM | POA: Insufficient documentation

## 2012-10-06 DIAGNOSIS — E8881 Metabolic syndrome: Secondary | ICD-10-CM | POA: Insufficient documentation

## 2012-10-06 DIAGNOSIS — E663 Overweight: Secondary | ICD-10-CM | POA: Insufficient documentation

## 2012-10-06 DIAGNOSIS — I10 Essential (primary) hypertension: Secondary | ICD-10-CM | POA: Insufficient documentation

## 2012-10-06 DIAGNOSIS — I2510 Atherosclerotic heart disease of native coronary artery without angina pectoris: Secondary | ICD-10-CM | POA: Insufficient documentation

## 2012-10-06 DIAGNOSIS — I209 Angina pectoris, unspecified: Secondary | ICD-10-CM | POA: Insufficient documentation

## 2012-10-08 ENCOUNTER — Encounter (HOSPITAL_COMMUNITY)
Admission: RE | Admit: 2012-10-08 | Discharge: 2012-10-08 | Disposition: A | Discharge status: Home / Self Care | Payer: Self-pay | Source: Ambulatory Visit | Attending: Internal Medicine | Admitting: Internal Medicine

## 2012-10-09 ENCOUNTER — Encounter (HOSPITAL_COMMUNITY)
Admission: RE | Admit: 2012-10-09 | Discharge: 2012-10-09 | Disposition: A | Discharge status: Home / Self Care | Payer: Self-pay | Source: Ambulatory Visit | Attending: Internal Medicine | Admitting: Internal Medicine

## 2012-10-13 ENCOUNTER — Encounter (HOSPITAL_COMMUNITY)
Admission: RE | Admit: 2012-10-13 | Discharge: 2012-10-13 | Disposition: A | Discharge status: Home / Self Care | Payer: Self-pay | Source: Ambulatory Visit | Attending: Internal Medicine | Admitting: Internal Medicine

## 2012-10-14 ENCOUNTER — Encounter (HOSPITAL_COMMUNITY)
Admission: RE | Admit: 2012-10-14 | Discharge: 2012-10-14 | Disposition: A | Discharge status: Home / Self Care | Payer: Self-pay | Source: Ambulatory Visit | Attending: Internal Medicine | Admitting: Internal Medicine

## 2012-10-15 ENCOUNTER — Encounter (HOSPITAL_COMMUNITY): Payer: Self-pay

## 2012-10-16 ENCOUNTER — Encounter (HOSPITAL_COMMUNITY): Payer: Self-pay

## 2012-10-17 ENCOUNTER — Other Ambulatory Visit: Payer: Self-pay

## 2012-10-18 ENCOUNTER — Emergency Department (INDEPENDENT_AMBULATORY_CARE_PROVIDER_SITE_OTHER)
Admission: EM | Admit: 2012-10-18 | Discharge: 2012-10-18 | Disposition: A | Discharge status: Home / Self Care | Payer: Medicare Other | Source: Home / Self Care | Attending: Emergency Medicine | Admitting: Emergency Medicine

## 2012-10-18 ENCOUNTER — Emergency Department (INDEPENDENT_AMBULATORY_CARE_PROVIDER_SITE_OTHER): Payer: Medicare Other

## 2012-10-18 DIAGNOSIS — R05 Cough: Secondary | ICD-10-CM

## 2012-10-18 DIAGNOSIS — R059 Cough, unspecified: Secondary | ICD-10-CM | POA: Diagnosis not present

## 2012-10-18 DIAGNOSIS — J209 Acute bronchitis, unspecified: Secondary | ICD-10-CM | POA: Diagnosis not present

## 2012-10-18 MED ORDER — PROMETHAZINE-CODEINE 6.25-10 MG/5ML PO SYRP: ORAL_SOLUTION | ORAL | Status: DC

## 2012-10-18 MED ORDER — AZITHROMYCIN 250 MG PO TABS: ORAL_TABLET | ORAL | Status: DC

## 2012-10-18 NOTE — ED Notes (Signed)
Sathvik complains of dry cough which is worse at night, sneezing, body aches and fever. Denies chills or sweats. He has a past history of pneumonia.

## 2012-10-18 NOTE — ED Provider Notes (Addendum)
History     CSN: [id number]  Arrival date & time 10/18/12  1118   First MD Initiated Contact with Patient 10/18/12 1124      Chief Complaint  Patient presents with  . Cough    Dry cough x 5 days     Patient is a 72 y.o. male presenting with cough. The history is provided by the patient and the spouse.  Cough URI HISTORY  Gevork is a 72 y.o. male who complains of onset of cold symptoms for 5 days.  Have been using over-the-counter treatment which helps a little bit. Here with wife. Has severe hacking cough throughout the day , nonproductive, worse at night.  He and wife state they're especially concerned because about a year ago, he had right lower lobe pneumonia that resolved with antibiotics. Past medical history of CAD and hypertension that's controlled. Denies exertional chest pain or dyspnea  No chills/sweats +  Fever, which reduces with ibuprofen.  + Mild Nasal congestion No  Discolored Post-nasal drainage No sinus pain/pressure No sore throat  +  cough No wheezing No chest congestion No hemoptysis No shortness of breath No pleuritic pain  No itchy/red eyes No earache  No nausea No vomiting No abdominal pain No diarrhea  No skin rashes +  Fatigue No myalgias No headache   Past Medical History  Diagnosis Date  . CAD (coronary artery disease)     Dr Peter [place]  . HTN (hypertension)   . HLD (hyperlipidemia)   . DM (diabetes mellitus)   . ED (erectile dysfunction)   . Carotid stenosis   . Obesity   . OSA (obstructive sleep apnea)     oral appliance, Dr Myrtis Ser    Past Surgical History  Procedure Laterality Date  . Appendectomy  1958  . Ankle fracture surgery  1993  . Hand surgery  1965  . Tonsillectomy    . Cardiac catheterization  2007 & 2011  . Colonoscopy  2002    negative; Dr Juanda Chance    Family History  Problem Relation Age of Onset  . Diabetes Mother   . Colon cancer Paternal Uncle   . Heart attack Father      4 vessel CBAG @ 88   . Diabetes Brother   . Stroke Neg Hx     History  Substance Use Topics  . Smoking status: Former Smoker -- 1.00 packs/day    Quit date: 09/02/1990  . Smokeless tobacco: Never Used     Comment: onset age [age] up to 1 ppd  . Alcohol Use: No      Review of Systems  Respiratory: Positive for cough.   All other systems reviewed and are negative.   see above  Allergies  Acetaminophen and Oxycodone-acetaminophen  Home Medications   Current Outpatient Rx  Name  Route  Sig  Dispense  Refill  . aspirin 81 MG tablet   Oral   Take 81 mg by mouth daily.           . carvedilol (COREG) 12.5 MG tablet      Take 1 & 1/2 Tablets twice daily with meals   270 tablet   2   . clopidogrel (PLAVIX) 75 MG tablet   Oral   Take 1 tablet (75 mg total) by mouth daily.   90 tablet   3   . fish oil-omega-3 fatty acids 1000 MG capsule   Oral   Take 2 g by mouth daily.           Marland Kitchen  HYDROcodone-homatropine (HYDROMET) 5-1.5 MG/5ML syrup      His hepatitis was due to Tylenol, not the codeine component   120 mL   0   . lisinopril (PRINIVIL,ZESTRIL) 20 MG tablet   Oral   Take 1 tablet (20 mg total) by mouth 2 (two) times daily.   180 tablet   3   . metFORMIN (GLUCOPHAGE) 500 MG tablet      TAKE ONE TABLET BY MOUTH TWO TIMES A DAY WITH 2 LARGEST MEALS   180 tablet   1   . Multiple Vitamin (MULTIVITAMIN) tablet   Oral   Take 1 tablet by mouth daily.         . niacin (NIASPAN) 500 MG CR tablet   Oral   Take 2 tablets (1,000 mg total) by mouth at bedtime.   90 tablet   3   . nitroGLYCERIN (NITROSTAT) 0.4 MG SL tablet   Sublingual   Place 1 tablet (0.4 mg total) under the tongue every 5 (five) minutes as needed.   25 tablet   prn   . Saw Palmetto, Serenoa repens, (SAW PALMETTO PO)   Oral   Take 1 tablet by mouth daily.           . simvastatin (ZOCOR) 40 MG tablet   Oral   Take 1 tablet (40 mg total) by mouth at bedtime.   90 tablet   3   . azithromycin  (ZITHROMAX Z-PAK) 250 MG tablet      Use as directed   1 each   0   . promethazine-codeine (PHENERGAN WITH CODEINE) 6.25-10 MG/5ML syrup      Take 1-2 teaspoons every 4-6 hours as needed for cough. May cause drowsiness.   120 mL   0     BP 128/69  Pulse 75  Temp(Src) 98.8 F (37.1 C) (Oral)  Ht 5\' 8"  (1.727 m)  Wt 195 lb (88.451 kg)  BMI 29.66 kg/m2  SpO2 96%  Physical Exam  Nursing note and vitals reviewed. Constitutional: He is oriented to person, place, and time. He appears well-developed and well-nourished. No distress.  He is fatigued, appears moderately ill, but nontoxic. No acute cardiorespiratory distress. Here with wife.  HENT:  Head: Normocephalic and atraumatic.  Right Ear: Tympanic membrane and external ear normal.  Left Ear: Tympanic membrane and external ear normal.  Nose: Nose normal.  Mouth/Throat: Oropharynx is clear and moist. No oropharyngeal exudate.  Eyes: Conjunctivae are normal. Pupils are equal, round, and reactive to light. Right eye exhibits no discharge. Left eye exhibits no discharge. No scleral icterus.  Neck: Neck supple.  Cardiovascular: Normal rate, regular rhythm and normal heart sounds.   Pulmonary/Chest: Effort normal. No respiratory distress. He has no wheezes (Few wheezes bilateral anterior and posterior lung fields.--Good air movement bilaterally .). He has rhonchi. Rales: minimal, faint, bibasilar rales, which clear after coughing. He exhibits no tenderness.  Rhonchi are present throughout bilateral upper and lower posterior lung fields  and bilateral anterior lung fields.  Lymphadenopathy:    He has no cervical adenopathy.  Neurological: He is alert and oriented to person, place, and time. No cranial nerve deficit.  Skin: Skin is warm and dry. He is not diaphoretic.    ED Course  Procedures (including critical care time)  Labs Reviewed - No data to display Dg Chest 2 View  10/18/2012  *RADIOLOGY REPORT*  Clinical Data: Cough.   CHEST - 2 VIEW  Comparison: 03/02/2012  Findings: Heart is upper limits normal  in size.  Lungs are clear. No effusions.  No acute bony abnormality.  IMPRESSION: No active cardiopulmonary disease.   Original Report Authenticated By: Charlett Nose, M.D.      1. Acute bronchitis       MDM  We reviewed negative chest x-ray. No evidence of any pneumonia or any active cardiopulmonary disease. Diagnosis consistent with acute bronchitis. O2 saturation normal 96% on room air. We discussed treatment options. I offered to start with a shot of IM Rocephin, but he declined this. Discussed the option of a bronchodilator, but his wheezing is minimal, and he declined the option of bronchodilator. After risks, benefits, alternatives discussed, and Zithromax Z-Pak prescribed to cover bacterial and atypical causes. At his request for a cough med, prescribed promethazine with codeine, 1 or 2 teaspoons at bedtime. Although he's had some type of intolerance of oxycodone and Tylenol, he states he's taken promethazine with codeine in the past without problems. Red flags discussed. Followup with Dr. Alwyn Ren, his PCP, if no better one week, or sooner if worse or new symptoms.        Lajean Manes, MD 10/18/12 1329  Lajean Manes, MD 10/18/12 1330

## 2012-10-19 ENCOUNTER — Other Ambulatory Visit (INDEPENDENT_AMBULATORY_CARE_PROVIDER_SITE_OTHER): Payer: Medicare Other

## 2012-10-19 DIAGNOSIS — E119 Type 2 diabetes mellitus without complications: Secondary | ICD-10-CM

## 2012-10-19 DIAGNOSIS — I10 Essential (primary) hypertension: Secondary | ICD-10-CM | POA: Diagnosis not present

## 2012-10-19 DIAGNOSIS — E785 Hyperlipidemia, unspecified: Secondary | ICD-10-CM | POA: Diagnosis not present

## 2012-10-19 LAB — HEPATIC FUNCTION PANEL
ALT: 26 U/L (ref 0–53)
AST: 30 U/L (ref 0–37)
Albumin: 3.8 g/dL (ref 3.5–5.2)
Alkaline Phosphatase: 58 U/L (ref 39–117)
Bilirubin, Direct: 0.1 mg/dL (ref 0.0–0.3)
Total Bilirubin: 0.7 mg/dL (ref 0.3–1.2)
Total Protein: 6.8 g/dL (ref 6.0–8.3)

## 2012-10-19 LAB — BASIC METABOLIC PANEL
BUN: 13 mg/dL (ref 6–23)
CO2: 29 mEq/L (ref 19–32)
Calcium: 8.8 mg/dL (ref 8.4–10.5)
Chloride: 103 mEq/L (ref 96–112)
Creatinine, Ser: 0.9 mg/dL (ref 0.4–1.5)
GFR: 90.53 mL/min (ref >60.00)
Glucose, Bld: 103 mg/dL — ABNORMAL HIGH (ref 70–99)
Potassium: 4.2 mEq/L (ref 3.5–5.1)
Sodium: 140 mEq/L (ref 135–145)

## 2012-10-19 LAB — HEMOGLOBIN A1C: Hgb A1c MFr Bld: 6.5 % (ref 4.6–6.5)

## 2012-10-19 LAB — LIPID PANEL
Cholesterol: 92 mg/dL (ref 0–200)
HDL: 39.8 mg/dL (ref >39.00)
LDL Cholesterol: 32 mg/dL (ref 0–99)
Total CHOL/HDL Ratio: 2
Triglycerides: 101 mg/dL (ref 0.0–149.0)
VLDL: 20.2 mg/dL (ref 0.0–40.0)

## 2012-10-20 ENCOUNTER — Encounter (HOSPITAL_COMMUNITY): Payer: Self-pay

## 2012-10-22 ENCOUNTER — Encounter (HOSPITAL_COMMUNITY): Payer: Self-pay

## 2012-10-22 ENCOUNTER — Encounter: Payer: Self-pay | Admitting: Cardiology

## 2012-10-22 ENCOUNTER — Ambulatory Visit (INDEPENDENT_AMBULATORY_CARE_PROVIDER_SITE_OTHER): Payer: Medicare Other | Admitting: Cardiology

## 2012-10-22 VITALS — BP 140/76 | HR 63 | Ht 68.0 in | Wt 193.8 lb

## 2012-10-22 DIAGNOSIS — I2510 Atherosclerotic heart disease of native coronary artery without angina pectoris: Secondary | ICD-10-CM

## 2012-10-22 DIAGNOSIS — E785 Hyperlipidemia, unspecified: Secondary | ICD-10-CM | POA: Diagnosis not present

## 2012-10-22 DIAGNOSIS — I10 Essential (primary) hypertension: Secondary | ICD-10-CM

## 2012-10-22 DIAGNOSIS — E119 Type 2 diabetes mellitus without complications: Secondary | ICD-10-CM

## 2012-10-22 NOTE — Patient Instructions (Signed)
Continue your current therapy.  I will see you back in 6 months.   

## 2012-10-23 ENCOUNTER — Encounter (HOSPITAL_COMMUNITY): Payer: Self-pay

## 2012-10-23 NOTE — Progress Notes (Signed)
Jacob Curry Date of Birth: Oct 19, 1940 Medical Record #[id number]  History of Present Illness: Jacob Curry is seen for followup. He has a known history of coronary disease with cardiac catheterization in May of 2011 showing total occlusion of a large left circumflex vessel with good left to left collaterals. Ejection fraction was 50-55%. He has been managed medically. He has a history of hypertension, hyperlipidemia, and diabetes. He has a history of moderate obstructive sleep apnea. This is managed with an oral appliance. He was unable to tolerate CPAP therapy. He reports that he had a recent bronchitis. He has been on a Z-Pak since Sunday. He denies any fever. His cough is improving but he still has a nonproductive cough. He denies any significant chest pain.  Current Outpatient Prescriptions on File Prior to Visit  Medication Sig Dispense Refill  . aspirin 81 MG tablet Take 81 mg by mouth daily.        Marland Kitchen azithromycin (ZITHROMAX Z-PAK) 250 MG tablet Use as directed  1 each  0  . carvedilol (COREG) 12.5 MG tablet Take 1 & 1/2 Tablets twice daily with meals  270 tablet  2  . clopidogrel (PLAVIX) 75 MG tablet Take 1 tablet (75 mg total) by mouth daily.  90 tablet  3  . fish oil-omega-3 fatty acids 1000 MG capsule Take 2 g by mouth daily.        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 metFORMIN (GLUCOPHAGE) 500 MG tablet TAKE ONE TABLET BY MOUTH TWO TIMES A DAY WITH 2 LARGEST MEALS  180 tablet  1  . Multiple Vitamin (MULTIVITAMIN) tablet Take 1 tablet by mouth daily.      . niacin (NIASPAN) 500 MG CR tablet Take 2 tablets (1,000 mg total) by mouth at bedtime.  90 tablet  3  . nitroGLYCERIN (NITROSTAT) 0.4 MG SL tablet Place 1 tablet (0.4 mg total) under the tongue every 5 (five) minutes as needed.  25 tablet  prn  . promethazine-codeine (PHENERGAN WITH CODEINE) 6.25-10 MG/5ML syrup Take 1-2 teaspoons every 4-6  hours as needed for cough. May cause drowsiness.  120 mL  0  . Saw Palmetto, Serenoa repens, (SAW PALMETTO PO) Take 1 tablet by mouth daily.        . simvastatin (ZOCOR) 40 MG tablet Take 1 tablet (40 mg total) by mouth at bedtime.  90 tablet  3   No current facility-administered medications on file prior to visit.    Allergies  Allergen Reactions  . Acetaminophen     Drug induced hepatitis  . Oxycodone-Acetaminophen     Drug induced hepatitis    Past Medical History  Diagnosis Date  . CAD (coronary artery disease)     Dr Cherylann Hobday [place]  . HTN (hypertension)   . HLD (hyperlipidemia)   . DM (diabetes mellitus)   . ED (erectile dysfunction)   . Carotid stenosis   . Obesity   . OSA (obstructive sleep apnea)     oral appliance, Dr Myrtis Ser    Past Surgical History  Procedure Laterality Date  . Appendectomy  1958  . Ankle fracture surgery  1993  . Hand surgery  1965  . Tonsillectomy    . Cardiac catheterization  2007 & 2011  . Colonoscopy  2002    negative; Dr Juanda Chance    History  Smoking status  . Former Smoker -- 1.00 packs/day  . Quit date:  09/02/1990  Smokeless tobacco  . Never Used    Comment: onset age 72-50 up to 1 ppd    History  Alcohol Use No    Family History  Problem Relation Age of Onset  . Diabetes Mother   . Colon cancer Paternal Uncle   . Heart attack Father      4 vessel CBAG @ 88  . Diabetes Brother   . Stroke Neg Hx     Review of Systems: As noted in history of present illness.  All other systems were reviewed and are negative.  Physical Exam: BP 140/76  Pulse 63  Ht 5\' 8"  (1.727 m)  Wt 193 lb 12.8 oz (87.907 kg)  BMI 29.47 kg/m2  SpO2 99% He is a pleasant white male in no acute distress. HEENT exam is unremarkable. He has no jugular venous distention or bruits. There is no adenopathy or thyromegaly. Cardiac exam reveals a regular rate and rhythm without gallop, murmur, or click. PMI is normal. Lungs are clear. Abdomen is soft and  nontender without masses or bruits. He has normal bowel sounds. Extremities are without cyanosis or edema. Pedal pulses are 2+ and symmetric. He is alert and oriented x3. Cranial nerves II through XII are intact. He has no focal findings. LABORATORY DATA: Lab Results  Component Value Date   WBC 7.9 03/09/2012   HGB 14.2 03/09/2012   HCT 43.2 03/09/2012   PLT 194.0 03/09/2012   GLUCOSE 103* 10/19/2012   CHOL 92 10/19/2012   TRIG 101.0 10/19/2012   HDL 39.80 10/19/2012   LDLDIRECT 39.9 07/22/2008   LDLCALC 32 10/19/2012   ALT 26 10/19/2012   AST 30 10/19/2012   NA 140 10/19/2012   K 4.2 10/19/2012   CL 103 10/19/2012   CREATININE 0.9 10/19/2012   BUN 13 10/19/2012   CO2 29 10/19/2012   TSH 2.27 03/09/2012   PSA 1.70 01/04/2009   INR 1.1 ratio* 01/15/2010   HGBA1C 6.5 10/19/2012   MICROALBUR 0.4 03/09/2012    Assessment / Plan: 1. 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 Patient is asymptomatic on medical therapy. We may want to consider a followup stress test next year but we know from previous evaluation and a stress test was abnormal.  2. Hypertension, controlled.  3. Hyperlipidemia well controlled on medication.  4. Recent bronchitis improving.

## 2012-10-26 ENCOUNTER — Ambulatory Visit (INDEPENDENT_AMBULATORY_CARE_PROVIDER_SITE_OTHER): Payer: Medicare Other | Admitting: Internal Medicine

## 2012-10-26 ENCOUNTER — Other Ambulatory Visit: Payer: Self-pay | Admitting: *Deleted

## 2012-10-26 ENCOUNTER — Encounter: Payer: Self-pay | Admitting: Internal Medicine

## 2012-10-26 VITALS — BP 138/80 | HR 82 | Temp 97.6°F | Wt 193.0 lb

## 2012-10-26 DIAGNOSIS — I10 Essential (primary) hypertension: Secondary | ICD-10-CM

## 2012-10-26 DIAGNOSIS — J209 Acute bronchitis, unspecified: Secondary | ICD-10-CM

## 2012-10-26 MED ORDER — DOXYCYCLINE HYCLATE 100 MG PO TABS: 100.0000 mg | ORAL_TABLET | Freq: Two times a day (BID) | ORAL | Status: DC

## 2012-10-26 MED ORDER — LOSARTAN POTASSIUM 100 MG PO TABS
ORAL_TABLET | ORAL | Status: DC
Start: 2012-10-26 — End: 2013-05-17

## 2012-10-26 MED ORDER — BENZONATATE 200 MG PO CAPS: 200.0000 mg | ORAL_CAPSULE | Freq: Three times a day (TID) | ORAL | Status: DC | PRN

## 2012-10-26 NOTE — Patient Instructions (Addendum)
Stay well hydrated. Drink to thirst up to 32 ounces of fluids daily.

## 2012-10-26 NOTE — Progress Notes (Signed)
  Subjective:    Patient ID: Jacob Curry, male    DOB: March 12, 1941, 72 y.o.   MRN: [id number]  HPI The acute bronchitis was treatted with Zpack; but dry cough persists.CXray 10/18/12 @ [facility] reviewed ; no PNA. No exposures reported  to sick family,friends,  allergens,or environmental factors as triggers .  Significant active  associated symptoms include some rib pain from coughing. Cough is not associated with sputum production ,wheezing  & dyspnea .  There is no fever or chills,but night sweats were present     Flu shot current   Treatment with   NSAIDS & Rx cough syrup  was partially effective. There is no history of asthma ,  seasonal or perennial allergies.  The patient had quit smoking in 1992                Review of Systems Symptoms not present include frontal headache,facial pain, dental pain,  sore throat, nasal purulence,  earache, &  otic discharge.    Myalgias and arthralgias were not present.      Objective:   Physical Exam  General appearance:good health ;well nourished; no acute distress or increased work of breathing is present.   Eyes: No conjunctival inflammation or lid edema is present.  Ears:  External ear exam shows no significant lesions or deformities.  Otoscopic examination reveals clear canals, tympanic membranes are intact bilaterally without bulging, retraction, inflammation or discharge. Nose:  External nasal examination shows no deformity or inflammation. Nasal mucosa are pink and moist without lesions or exudates. No septal dislocation or deviation.No obstruction to airflow.  Oral exam: Dental hygiene is good; lips and gums are healthy appearing.There is no oropharyngeal erythema or exudate noted. Slightly hoarse Neck:  No deformities, masses, or tenderness noted.   Heart:  Normal rate and regular rhythm. S1 and S2 normal without gallop, click, rub or murmur.  Grade 1 murmur Lungs:Chest clear to auscultation; no wheezes, rhonchi,rales ,or  rubs present.No increased work of breathing.  Dry cough Extremities:  No cyanosis, edema, or clubbing  noted  No  lymphadenopathy about the head, neck, or axilla noted.  Skin: Warm & dry         Assessment & Plan:  #1 acute bronchitis w/o bronchospasm. The ACE inhibitor may be aggravating the cough; a trial of losartan will be initiated  Plan: See orders and recommendations

## 2012-10-27 ENCOUNTER — Encounter (HOSPITAL_COMMUNITY): Payer: Self-pay

## 2012-10-29 ENCOUNTER — Encounter (HOSPITAL_COMMUNITY): Payer: Self-pay

## 2012-10-30 ENCOUNTER — Encounter (HOSPITAL_COMMUNITY): Payer: Self-pay

## 2012-11-03 ENCOUNTER — Encounter (HOSPITAL_COMMUNITY): Payer: Self-pay

## 2012-11-03 DIAGNOSIS — E8881 Metabolic syndrome: Secondary | ICD-10-CM | POA: Insufficient documentation

## 2012-11-03 DIAGNOSIS — R0602 Shortness of breath: Secondary | ICD-10-CM | POA: Insufficient documentation

## 2012-11-03 DIAGNOSIS — I209 Angina pectoris, unspecified: Secondary | ICD-10-CM | POA: Insufficient documentation

## 2012-11-03 DIAGNOSIS — Z9861 Coronary angioplasty status: Secondary | ICD-10-CM | POA: Insufficient documentation

## 2012-11-03 DIAGNOSIS — Z5189 Encounter for other specified aftercare: Secondary | ICD-10-CM | POA: Insufficient documentation

## 2012-11-03 DIAGNOSIS — E663 Overweight: Secondary | ICD-10-CM | POA: Insufficient documentation

## 2012-11-03 DIAGNOSIS — I10 Essential (primary) hypertension: Secondary | ICD-10-CM | POA: Insufficient documentation

## 2012-11-03 DIAGNOSIS — E785 Hyperlipidemia, unspecified: Secondary | ICD-10-CM | POA: Insufficient documentation

## 2012-11-03 DIAGNOSIS — Z7982 Long term (current) use of aspirin: Secondary | ICD-10-CM | POA: Insufficient documentation

## 2012-11-03 DIAGNOSIS — I2510 Atherosclerotic heart disease of native coronary artery without angina pectoris: Secondary | ICD-10-CM | POA: Insufficient documentation

## 2012-11-05 ENCOUNTER — Encounter (HOSPITAL_COMMUNITY)
Admission: RE | Admit: 2012-11-05 | Discharge: 2012-11-05 | Disposition: A | Discharge status: Home / Self Care | Payer: Self-pay | Source: Ambulatory Visit | Attending: Internal Medicine | Admitting: Internal Medicine

## 2012-11-06 ENCOUNTER — Encounter (HOSPITAL_COMMUNITY): Payer: Self-pay

## 2012-11-09 DIAGNOSIS — H26019 Infantile and juvenile cortical, lamellar, or zonular cataract, unspecified eye: Secondary | ICD-10-CM | POA: Diagnosis not present

## 2012-11-09 DIAGNOSIS — E119 Type 2 diabetes mellitus without complications: Secondary | ICD-10-CM | POA: Diagnosis not present

## 2012-11-09 DIAGNOSIS — H2510 Age-related nuclear cataract, unspecified eye: Secondary | ICD-10-CM | POA: Diagnosis not present

## 2012-11-10 ENCOUNTER — Encounter (HOSPITAL_COMMUNITY)
Admission: RE | Admit: 2012-11-10 | Discharge: 2012-11-10 | Disposition: A | Discharge status: Home / Self Care | Payer: Self-pay | Source: Ambulatory Visit | Attending: Internal Medicine | Admitting: Internal Medicine

## 2012-11-12 ENCOUNTER — Encounter (HOSPITAL_COMMUNITY)
Admission: RE | Admit: 2012-11-12 | Discharge: 2012-11-12 | Disposition: A | Discharge status: Home / Self Care | Payer: Self-pay | Source: Ambulatory Visit | Attending: Internal Medicine | Admitting: Internal Medicine

## 2012-11-13 ENCOUNTER — Encounter (HOSPITAL_COMMUNITY): Payer: Self-pay

## 2012-11-17 ENCOUNTER — Encounter (HOSPITAL_COMMUNITY): Payer: Self-pay

## 2012-11-19 ENCOUNTER — Encounter (HOSPITAL_COMMUNITY)
Admission: RE | Admit: 2012-11-19 | Discharge: 2012-11-19 | Disposition: A | Discharge status: Home / Self Care | Payer: Self-pay | Source: Ambulatory Visit | Attending: Internal Medicine | Admitting: Internal Medicine

## 2012-11-20 ENCOUNTER — Other Ambulatory Visit: Payer: Self-pay | Admitting: Cardiology

## 2012-11-20 ENCOUNTER — Encounter (HOSPITAL_COMMUNITY)
Admission: RE | Admit: 2012-11-20 | Discharge: 2012-11-20 | Disposition: A | Discharge status: Home / Self Care | Payer: Self-pay | Source: Ambulatory Visit | Attending: Internal Medicine | Admitting: Internal Medicine

## 2012-11-24 ENCOUNTER — Encounter (HOSPITAL_COMMUNITY)
Admission: RE | Admit: 2012-11-24 | Discharge: 2012-11-24 | Disposition: A | Discharge status: Home / Self Care | Payer: Self-pay | Source: Ambulatory Visit | Attending: Internal Medicine | Admitting: Internal Medicine

## 2012-11-26 ENCOUNTER — Encounter (HOSPITAL_COMMUNITY)
Admission: RE | Admit: 2012-11-26 | Discharge: 2012-11-26 | Disposition: A | Discharge status: Home / Self Care | Payer: Self-pay | Source: Ambulatory Visit | Attending: Internal Medicine | Admitting: Internal Medicine

## 2012-11-27 ENCOUNTER — Encounter (HOSPITAL_COMMUNITY)
Admission: RE | Admit: 2012-11-27 | Discharge: 2012-11-27 | Disposition: A | Discharge status: Home / Self Care | Payer: Self-pay | Source: Ambulatory Visit | Attending: Internal Medicine | Admitting: Internal Medicine

## 2012-12-01 ENCOUNTER — Encounter (HOSPITAL_COMMUNITY)
Admission: RE | Admit: 2012-12-01 | Discharge: 2012-12-01 | Disposition: A | Discharge status: Home / Self Care | Payer: Self-pay | Source: Ambulatory Visit | Attending: Internal Medicine | Admitting: Internal Medicine

## 2012-12-01 DIAGNOSIS — Z9861 Coronary angioplasty status: Secondary | ICD-10-CM | POA: Insufficient documentation

## 2012-12-01 DIAGNOSIS — Z7982 Long term (current) use of aspirin: Secondary | ICD-10-CM | POA: Insufficient documentation

## 2012-12-01 DIAGNOSIS — R0602 Shortness of breath: Secondary | ICD-10-CM | POA: Insufficient documentation

## 2012-12-01 DIAGNOSIS — E8881 Metabolic syndrome: Secondary | ICD-10-CM | POA: Insufficient documentation

## 2012-12-01 DIAGNOSIS — I10 Essential (primary) hypertension: Secondary | ICD-10-CM | POA: Insufficient documentation

## 2012-12-01 DIAGNOSIS — Z5189 Encounter for other specified aftercare: Secondary | ICD-10-CM | POA: Insufficient documentation

## 2012-12-01 DIAGNOSIS — E785 Hyperlipidemia, unspecified: Secondary | ICD-10-CM | POA: Insufficient documentation

## 2012-12-01 DIAGNOSIS — E663 Overweight: Secondary | ICD-10-CM | POA: Insufficient documentation

## 2012-12-01 DIAGNOSIS — I209 Angina pectoris, unspecified: Secondary | ICD-10-CM | POA: Insufficient documentation

## 2012-12-01 DIAGNOSIS — I2510 Atherosclerotic heart disease of native coronary artery without angina pectoris: Secondary | ICD-10-CM | POA: Insufficient documentation

## 2012-12-03 ENCOUNTER — Encounter (HOSPITAL_COMMUNITY)
Admission: RE | Admit: 2012-12-03 | Discharge: 2012-12-03 | Disposition: A | Discharge status: Home / Self Care | Payer: Self-pay | Source: Ambulatory Visit | Attending: Internal Medicine | Admitting: Internal Medicine

## 2012-12-04 ENCOUNTER — Encounter (HOSPITAL_COMMUNITY)
Admission: RE | Admit: 2012-12-04 | Discharge: 2012-12-04 | Disposition: A | Discharge status: Home / Self Care | Payer: Self-pay | Source: Ambulatory Visit | Attending: Internal Medicine | Admitting: Internal Medicine

## 2012-12-08 ENCOUNTER — Encounter (HOSPITAL_COMMUNITY)
Admission: RE | Admit: 2012-12-08 | Discharge: 2012-12-08 | Disposition: A | Discharge status: Home / Self Care | Payer: Self-pay | Source: Ambulatory Visit | Attending: Internal Medicine | Admitting: Internal Medicine

## 2012-12-10 ENCOUNTER — Encounter (HOSPITAL_COMMUNITY): Payer: Self-pay

## 2012-12-11 ENCOUNTER — Encounter (HOSPITAL_COMMUNITY)
Admission: RE | Admit: 2012-12-11 | Discharge: 2012-12-11 | Disposition: A | Discharge status: Home / Self Care | Payer: Self-pay | Source: Ambulatory Visit | Attending: Internal Medicine | Admitting: Internal Medicine

## 2012-12-15 ENCOUNTER — Encounter (HOSPITAL_COMMUNITY): Payer: Self-pay

## 2012-12-17 ENCOUNTER — Encounter (HOSPITAL_COMMUNITY)
Admission: RE | Admit: 2012-12-17 | Discharge: 2012-12-17 | Disposition: A | Discharge status: Home / Self Care | Payer: Self-pay | Source: Ambulatory Visit | Attending: Internal Medicine | Admitting: Internal Medicine

## 2012-12-18 ENCOUNTER — Encounter (HOSPITAL_COMMUNITY)
Admission: RE | Admit: 2012-12-18 | Discharge: 2012-12-18 | Disposition: A | Discharge status: Home / Self Care | Payer: Self-pay | Source: Ambulatory Visit | Attending: Internal Medicine | Admitting: Internal Medicine

## 2012-12-22 ENCOUNTER — Encounter (HOSPITAL_COMMUNITY)
Admission: RE | Admit: 2012-12-22 | Discharge: 2012-12-22 | Disposition: A | Discharge status: Home / Self Care | Payer: Self-pay | Source: Ambulatory Visit | Attending: Internal Medicine | Admitting: Internal Medicine

## 2012-12-24 ENCOUNTER — Encounter (HOSPITAL_COMMUNITY)
Admission: RE | Admit: 2012-12-24 | Discharge: 2012-12-24 | Disposition: A | Discharge status: Home / Self Care | Payer: Self-pay | Source: Ambulatory Visit | Attending: Internal Medicine | Admitting: Internal Medicine

## 2012-12-25 ENCOUNTER — Encounter (HOSPITAL_COMMUNITY)
Admission: RE | Admit: 2012-12-25 | Discharge: 2012-12-25 | Disposition: A | Discharge status: Home / Self Care | Payer: Self-pay | Source: Ambulatory Visit | Attending: Internal Medicine | Admitting: Internal Medicine

## 2012-12-29 ENCOUNTER — Encounter (HOSPITAL_COMMUNITY)
Admission: RE | Admit: 2012-12-29 | Discharge: 2012-12-29 | Disposition: A | Discharge status: Home / Self Care | Payer: Self-pay | Source: Ambulatory Visit | Attending: Internal Medicine | Admitting: Internal Medicine

## 2012-12-31 ENCOUNTER — Encounter (HOSPITAL_COMMUNITY)
Admission: RE | Admit: 2012-12-31 | Discharge: 2012-12-31 | Disposition: A | Discharge status: Home / Self Care | Payer: Self-pay | Source: Ambulatory Visit | Attending: Internal Medicine | Admitting: Internal Medicine

## 2012-12-31 DIAGNOSIS — Z5189 Encounter for other specified aftercare: Secondary | ICD-10-CM | POA: Insufficient documentation

## 2012-12-31 DIAGNOSIS — R0602 Shortness of breath: Secondary | ICD-10-CM | POA: Insufficient documentation

## 2012-12-31 DIAGNOSIS — I209 Angina pectoris, unspecified: Secondary | ICD-10-CM | POA: Insufficient documentation

## 2012-12-31 DIAGNOSIS — E8881 Metabolic syndrome: Secondary | ICD-10-CM | POA: Insufficient documentation

## 2012-12-31 DIAGNOSIS — E785 Hyperlipidemia, unspecified: Secondary | ICD-10-CM | POA: Insufficient documentation

## 2012-12-31 DIAGNOSIS — E663 Overweight: Secondary | ICD-10-CM | POA: Insufficient documentation

## 2012-12-31 DIAGNOSIS — Z9861 Coronary angioplasty status: Secondary | ICD-10-CM | POA: Insufficient documentation

## 2012-12-31 DIAGNOSIS — I10 Essential (primary) hypertension: Secondary | ICD-10-CM | POA: Insufficient documentation

## 2012-12-31 DIAGNOSIS — I2510 Atherosclerotic heart disease of native coronary artery without angina pectoris: Secondary | ICD-10-CM | POA: Insufficient documentation

## 2012-12-31 DIAGNOSIS — Z7982 Long term (current) use of aspirin: Secondary | ICD-10-CM | POA: Insufficient documentation

## 2013-01-01 ENCOUNTER — Encounter (HOSPITAL_COMMUNITY): Payer: Self-pay

## 2013-01-05 ENCOUNTER — Encounter (HOSPITAL_COMMUNITY)
Admission: RE | Admit: 2013-01-05 | Discharge: 2013-01-05 | Disposition: A | Discharge status: Home / Self Care | Payer: Self-pay | Source: Ambulatory Visit | Attending: Internal Medicine | Admitting: Internal Medicine

## 2013-01-07 ENCOUNTER — Encounter (HOSPITAL_COMMUNITY): Payer: Self-pay

## 2013-01-08 ENCOUNTER — Encounter (HOSPITAL_COMMUNITY)
Admission: RE | Admit: 2013-01-08 | Discharge: 2013-01-08 | Disposition: A | Discharge status: Home / Self Care | Payer: Self-pay | Source: Ambulatory Visit | Attending: Internal Medicine | Admitting: Internal Medicine

## 2013-01-12 ENCOUNTER — Encounter (HOSPITAL_COMMUNITY): Payer: Self-pay

## 2013-01-14 ENCOUNTER — Encounter (HOSPITAL_COMMUNITY): Payer: Self-pay

## 2013-01-15 ENCOUNTER — Encounter (HOSPITAL_COMMUNITY)
Admission: RE | Admit: 2013-01-15 | Discharge: 2013-01-15 | Disposition: A | Discharge status: Home / Self Care | Payer: Self-pay | Source: Ambulatory Visit | Attending: Internal Medicine | Admitting: Internal Medicine

## 2013-01-19 ENCOUNTER — Encounter (HOSPITAL_COMMUNITY): Payer: Self-pay

## 2013-01-21 ENCOUNTER — Encounter (HOSPITAL_COMMUNITY)
Admission: RE | Admit: 2013-01-21 | Discharge: 2013-01-21 | Disposition: A | Discharge status: Home / Self Care | Payer: Self-pay | Source: Ambulatory Visit | Attending: Internal Medicine | Admitting: Internal Medicine

## 2013-01-22 ENCOUNTER — Encounter (HOSPITAL_COMMUNITY)
Admission: RE | Admit: 2013-01-22 | Discharge: 2013-01-22 | Disposition: A | Discharge status: Home / Self Care | Payer: Self-pay | Source: Ambulatory Visit | Attending: Cardiology | Admitting: Cardiology

## 2013-01-26 ENCOUNTER — Encounter (HOSPITAL_COMMUNITY): Payer: Self-pay

## 2013-01-28 ENCOUNTER — Encounter (HOSPITAL_COMMUNITY)
Admission: RE | Admit: 2013-01-28 | Discharge: 2013-01-28 | Disposition: A | Discharge status: Home / Self Care | Payer: Self-pay | Source: Ambulatory Visit | Attending: Internal Medicine | Admitting: Internal Medicine

## 2013-01-29 ENCOUNTER — Encounter (HOSPITAL_COMMUNITY)
Admission: RE | Admit: 2013-01-29 | Discharge: 2013-01-29 | Disposition: A | Discharge status: Home / Self Care | Payer: Self-pay | Source: Ambulatory Visit | Attending: Internal Medicine | Admitting: Internal Medicine

## 2013-02-02 ENCOUNTER — Encounter (HOSPITAL_COMMUNITY)
Admission: RE | Admit: 2013-02-02 | Discharge: 2013-02-02 | Disposition: A | Discharge status: Home / Self Care | Payer: Self-pay | Source: Ambulatory Visit | Attending: Internal Medicine | Admitting: Internal Medicine

## 2013-02-02 DIAGNOSIS — Z7982 Long term (current) use of aspirin: Secondary | ICD-10-CM | POA: Insufficient documentation

## 2013-02-02 DIAGNOSIS — I209 Angina pectoris, unspecified: Secondary | ICD-10-CM | POA: Insufficient documentation

## 2013-02-02 DIAGNOSIS — Z9861 Coronary angioplasty status: Secondary | ICD-10-CM | POA: Insufficient documentation

## 2013-02-02 DIAGNOSIS — E8881 Metabolic syndrome: Secondary | ICD-10-CM | POA: Insufficient documentation

## 2013-02-02 DIAGNOSIS — Z5189 Encounter for other specified aftercare: Secondary | ICD-10-CM | POA: Insufficient documentation

## 2013-02-02 DIAGNOSIS — R0602 Shortness of breath: Secondary | ICD-10-CM | POA: Insufficient documentation

## 2013-02-02 DIAGNOSIS — E663 Overweight: Secondary | ICD-10-CM | POA: Insufficient documentation

## 2013-02-02 DIAGNOSIS — I2510 Atherosclerotic heart disease of native coronary artery without angina pectoris: Secondary | ICD-10-CM | POA: Insufficient documentation

## 2013-02-02 DIAGNOSIS — E785 Hyperlipidemia, unspecified: Secondary | ICD-10-CM | POA: Insufficient documentation

## 2013-02-02 DIAGNOSIS — I10 Essential (primary) hypertension: Secondary | ICD-10-CM | POA: Insufficient documentation

## 2013-02-04 ENCOUNTER — Encounter (HOSPITAL_COMMUNITY)
Admission: RE | Admit: 2013-02-04 | Discharge: 2013-02-04 | Disposition: A | Discharge status: Home / Self Care | Payer: Self-pay | Source: Ambulatory Visit | Attending: Internal Medicine | Admitting: Internal Medicine

## 2013-02-05 ENCOUNTER — Encounter (HOSPITAL_COMMUNITY)
Admission: RE | Admit: 2013-02-05 | Discharge: 2013-02-05 | Disposition: A | Discharge status: Home / Self Care | Payer: Self-pay | Source: Ambulatory Visit | Attending: Internal Medicine | Admitting: Internal Medicine

## 2013-02-09 ENCOUNTER — Encounter (HOSPITAL_COMMUNITY): Payer: Self-pay

## 2013-02-11 ENCOUNTER — Encounter (HOSPITAL_COMMUNITY)
Admission: RE | Admit: 2013-02-11 | Discharge: 2013-02-11 | Disposition: A | Discharge status: Home / Self Care | Payer: Self-pay | Source: Ambulatory Visit | Attending: Internal Medicine | Admitting: Internal Medicine

## 2013-02-12 ENCOUNTER — Encounter (HOSPITAL_COMMUNITY)
Admission: RE | Admit: 2013-02-12 | Discharge: 2013-02-12 | Disposition: A | Discharge status: Home / Self Care | Payer: Self-pay | Source: Ambulatory Visit | Attending: Internal Medicine | Admitting: Internal Medicine

## 2013-02-16 ENCOUNTER — Encounter (HOSPITAL_COMMUNITY)
Admission: RE | Admit: 2013-02-16 | Discharge: 2013-02-16 | Disposition: A | Discharge status: Home / Self Care | Payer: Self-pay | Source: Ambulatory Visit | Attending: Internal Medicine | Admitting: Internal Medicine

## 2013-02-18 ENCOUNTER — Encounter (HOSPITAL_COMMUNITY)
Admission: RE | Admit: 2013-02-18 | Discharge: 2013-02-18 | Disposition: A | Discharge status: Home / Self Care | Payer: Self-pay | Source: Ambulatory Visit | Attending: Internal Medicine | Admitting: Internal Medicine

## 2013-02-19 ENCOUNTER — Encounter (HOSPITAL_COMMUNITY): Payer: Self-pay

## 2013-02-23 ENCOUNTER — Encounter (HOSPITAL_COMMUNITY)
Admission: RE | Admit: 2013-02-23 | Discharge: 2013-02-23 | Disposition: A | Discharge status: Home / Self Care | Payer: Self-pay | Source: Ambulatory Visit | Attending: Internal Medicine | Admitting: Internal Medicine

## 2013-02-25 ENCOUNTER — Encounter (HOSPITAL_COMMUNITY)
Admission: RE | Admit: 2013-02-25 | Discharge: 2013-02-25 | Disposition: A | Discharge status: Home / Self Care | Payer: Self-pay | Source: Ambulatory Visit | Attending: Internal Medicine | Admitting: Internal Medicine

## 2013-02-26 ENCOUNTER — Encounter (HOSPITAL_COMMUNITY)
Admission: RE | Admit: 2013-02-26 | Discharge: 2013-02-26 | Disposition: A | Discharge status: Home / Self Care | Payer: Self-pay | Source: Ambulatory Visit | Attending: Internal Medicine | Admitting: Internal Medicine

## 2013-03-02 ENCOUNTER — Encounter (HOSPITAL_COMMUNITY): Payer: Self-pay

## 2013-03-02 DIAGNOSIS — Z9861 Coronary angioplasty status: Secondary | ICD-10-CM | POA: Insufficient documentation

## 2013-03-02 DIAGNOSIS — I209 Angina pectoris, unspecified: Secondary | ICD-10-CM | POA: Insufficient documentation

## 2013-03-02 DIAGNOSIS — I10 Essential (primary) hypertension: Secondary | ICD-10-CM | POA: Insufficient documentation

## 2013-03-02 DIAGNOSIS — Z7982 Long term (current) use of aspirin: Secondary | ICD-10-CM | POA: Insufficient documentation

## 2013-03-02 DIAGNOSIS — E8881 Metabolic syndrome: Secondary | ICD-10-CM | POA: Insufficient documentation

## 2013-03-02 DIAGNOSIS — R0602 Shortness of breath: Secondary | ICD-10-CM | POA: Insufficient documentation

## 2013-03-02 DIAGNOSIS — I2510 Atherosclerotic heart disease of native coronary artery without angina pectoris: Secondary | ICD-10-CM | POA: Insufficient documentation

## 2013-03-02 DIAGNOSIS — E663 Overweight: Secondary | ICD-10-CM | POA: Insufficient documentation

## 2013-03-02 DIAGNOSIS — Z5189 Encounter for other specified aftercare: Secondary | ICD-10-CM | POA: Insufficient documentation

## 2013-03-02 DIAGNOSIS — E785 Hyperlipidemia, unspecified: Secondary | ICD-10-CM | POA: Insufficient documentation

## 2013-03-04 ENCOUNTER — Encounter (HOSPITAL_COMMUNITY)
Admission: RE | Admit: 2013-03-04 | Discharge: 2013-03-04 | Disposition: A | Discharge status: Home / Self Care | Payer: Self-pay | Source: Ambulatory Visit | Attending: Internal Medicine | Admitting: Internal Medicine

## 2013-03-09 ENCOUNTER — Encounter (HOSPITAL_COMMUNITY)
Admission: RE | Admit: 2013-03-09 | Discharge: 2013-03-09 | Disposition: A | Discharge status: Home / Self Care | Payer: Self-pay | Source: Ambulatory Visit | Attending: Internal Medicine | Admitting: Internal Medicine

## 2013-03-11 ENCOUNTER — Encounter (HOSPITAL_COMMUNITY): Payer: Self-pay

## 2013-03-12 ENCOUNTER — Encounter (HOSPITAL_COMMUNITY): Payer: Self-pay

## 2013-03-16 ENCOUNTER — Encounter (HOSPITAL_COMMUNITY)
Admission: RE | Admit: 2013-03-16 | Discharge: 2013-03-16 | Disposition: A | Discharge status: Home / Self Care | Payer: Self-pay | Source: Ambulatory Visit | Attending: Internal Medicine | Admitting: Internal Medicine

## 2013-03-18 ENCOUNTER — Encounter (HOSPITAL_COMMUNITY)
Admission: RE | Admit: 2013-03-18 | Discharge: 2013-03-18 | Disposition: A | Discharge status: Home / Self Care | Payer: Self-pay | Source: Ambulatory Visit | Attending: Internal Medicine | Admitting: Internal Medicine

## 2013-03-19 ENCOUNTER — Encounter (HOSPITAL_COMMUNITY): Payer: Self-pay

## 2013-03-23 ENCOUNTER — Encounter (HOSPITAL_COMMUNITY): Payer: Self-pay

## 2013-03-25 ENCOUNTER — Encounter (HOSPITAL_COMMUNITY)
Admission: RE | Admit: 2013-03-25 | Discharge: 2013-03-25 | Disposition: A | Discharge status: Home / Self Care | Payer: Self-pay | Source: Ambulatory Visit | Attending: Internal Medicine | Admitting: Internal Medicine

## 2013-03-26 ENCOUNTER — Encounter (HOSPITAL_COMMUNITY)
Admission: RE | Admit: 2013-03-26 | Discharge: 2013-03-26 | Disposition: A | Discharge status: Home / Self Care | Payer: Self-pay | Source: Ambulatory Visit | Attending: Internal Medicine | Admitting: Internal Medicine

## 2013-03-30 ENCOUNTER — Encounter (HOSPITAL_COMMUNITY)
Admission: RE | Admit: 2013-03-30 | Discharge: 2013-03-30 | Disposition: A | Discharge status: Home / Self Care | Payer: Self-pay | Source: Ambulatory Visit | Attending: Internal Medicine | Admitting: Internal Medicine

## 2013-04-01 ENCOUNTER — Encounter (HOSPITAL_COMMUNITY)
Admission: RE | Admit: 2013-04-01 | Discharge: 2013-04-01 | Disposition: A | Discharge status: Home / Self Care | Payer: Self-pay | Source: Ambulatory Visit | Attending: Internal Medicine | Admitting: Internal Medicine

## 2013-04-02 ENCOUNTER — Encounter (HOSPITAL_COMMUNITY): Payer: Self-pay

## 2013-04-02 DIAGNOSIS — E8881 Metabolic syndrome: Secondary | ICD-10-CM | POA: Insufficient documentation

## 2013-04-02 DIAGNOSIS — R0602 Shortness of breath: Secondary | ICD-10-CM | POA: Insufficient documentation

## 2013-04-02 DIAGNOSIS — Z7982 Long term (current) use of aspirin: Secondary | ICD-10-CM | POA: Insufficient documentation

## 2013-04-02 DIAGNOSIS — E785 Hyperlipidemia, unspecified: Secondary | ICD-10-CM | POA: Insufficient documentation

## 2013-04-02 DIAGNOSIS — E663 Overweight: Secondary | ICD-10-CM | POA: Insufficient documentation

## 2013-04-02 DIAGNOSIS — Z9861 Coronary angioplasty status: Secondary | ICD-10-CM | POA: Insufficient documentation

## 2013-04-02 DIAGNOSIS — I10 Essential (primary) hypertension: Secondary | ICD-10-CM | POA: Insufficient documentation

## 2013-04-02 DIAGNOSIS — I209 Angina pectoris, unspecified: Secondary | ICD-10-CM | POA: Insufficient documentation

## 2013-04-02 DIAGNOSIS — I2510 Atherosclerotic heart disease of native coronary artery without angina pectoris: Secondary | ICD-10-CM | POA: Insufficient documentation

## 2013-04-02 DIAGNOSIS — Z5189 Encounter for other specified aftercare: Secondary | ICD-10-CM | POA: Insufficient documentation

## 2013-04-06 ENCOUNTER — Encounter (HOSPITAL_COMMUNITY)
Admission: RE | Admit: 2013-04-06 | Discharge: 2013-04-06 | Disposition: A | Discharge status: Home / Self Care | Payer: Self-pay | Source: Ambulatory Visit | Attending: Internal Medicine | Admitting: Internal Medicine

## 2013-04-08 ENCOUNTER — Encounter (HOSPITAL_COMMUNITY)
Admission: RE | Admit: 2013-04-08 | Discharge: 2013-04-08 | Disposition: A | Discharge status: Home / Self Care | Payer: Self-pay | Source: Ambulatory Visit | Attending: Internal Medicine | Admitting: Internal Medicine

## 2013-04-09 ENCOUNTER — Encounter (HOSPITAL_COMMUNITY)
Admission: RE | Admit: 2013-04-09 | Discharge: 2013-04-09 | Disposition: A | Discharge status: Home / Self Care | Payer: Self-pay | Source: Ambulatory Visit | Attending: Internal Medicine | Admitting: Internal Medicine

## 2013-04-13 ENCOUNTER — Encounter (HOSPITAL_COMMUNITY): Payer: Self-pay

## 2013-04-14 ENCOUNTER — Other Ambulatory Visit: Payer: Self-pay | Admitting: Cardiology

## 2013-04-15 ENCOUNTER — Encounter: Payer: Self-pay | Admitting: Cardiology

## 2013-04-15 ENCOUNTER — Encounter (HOSPITAL_COMMUNITY)
Admission: RE | Admit: 2013-04-15 | Discharge: 2013-04-15 | Disposition: A | Discharge status: Home / Self Care | Payer: Self-pay | Source: Ambulatory Visit | Attending: Internal Medicine | Admitting: Internal Medicine

## 2013-04-15 ENCOUNTER — Other Ambulatory Visit: Payer: Self-pay

## 2013-04-15 DIAGNOSIS — I10 Essential (primary) hypertension: Secondary | ICD-10-CM

## 2013-04-15 DIAGNOSIS — E785 Hyperlipidemia, unspecified: Secondary | ICD-10-CM

## 2013-04-16 ENCOUNTER — Encounter (HOSPITAL_COMMUNITY)
Admission: RE | Admit: 2013-04-16 | Discharge: 2013-04-16 | Disposition: A | Discharge status: Home / Self Care | Payer: Self-pay | Source: Ambulatory Visit | Attending: Internal Medicine | Admitting: Internal Medicine

## 2013-04-18 IMAGING — CR DG CHEST 2V
2 series · 2 of 2 positions shown · non-contrast
Comparison: 03/02/2012

CLINICAL DATA: Cough.

CHEST - 2 VIEW

[view not recorded (1 of 2)]
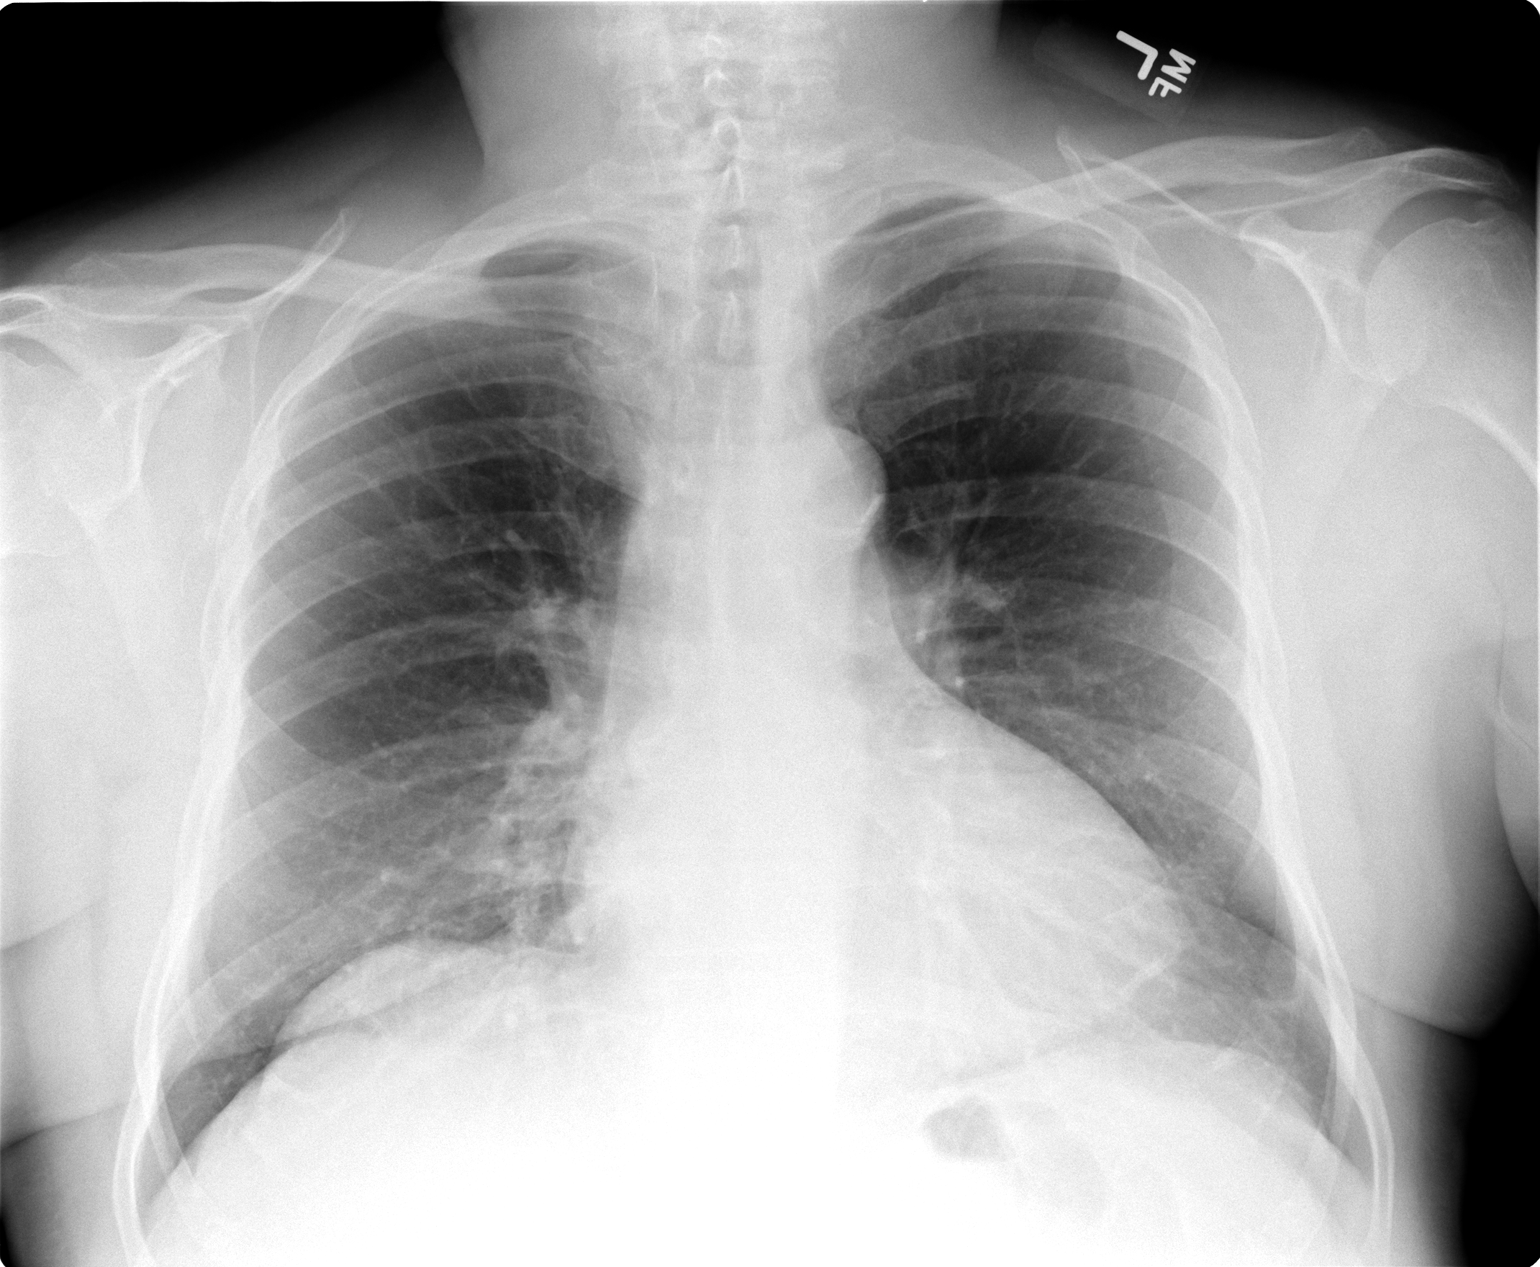

[view not recorded (2 of 2)]
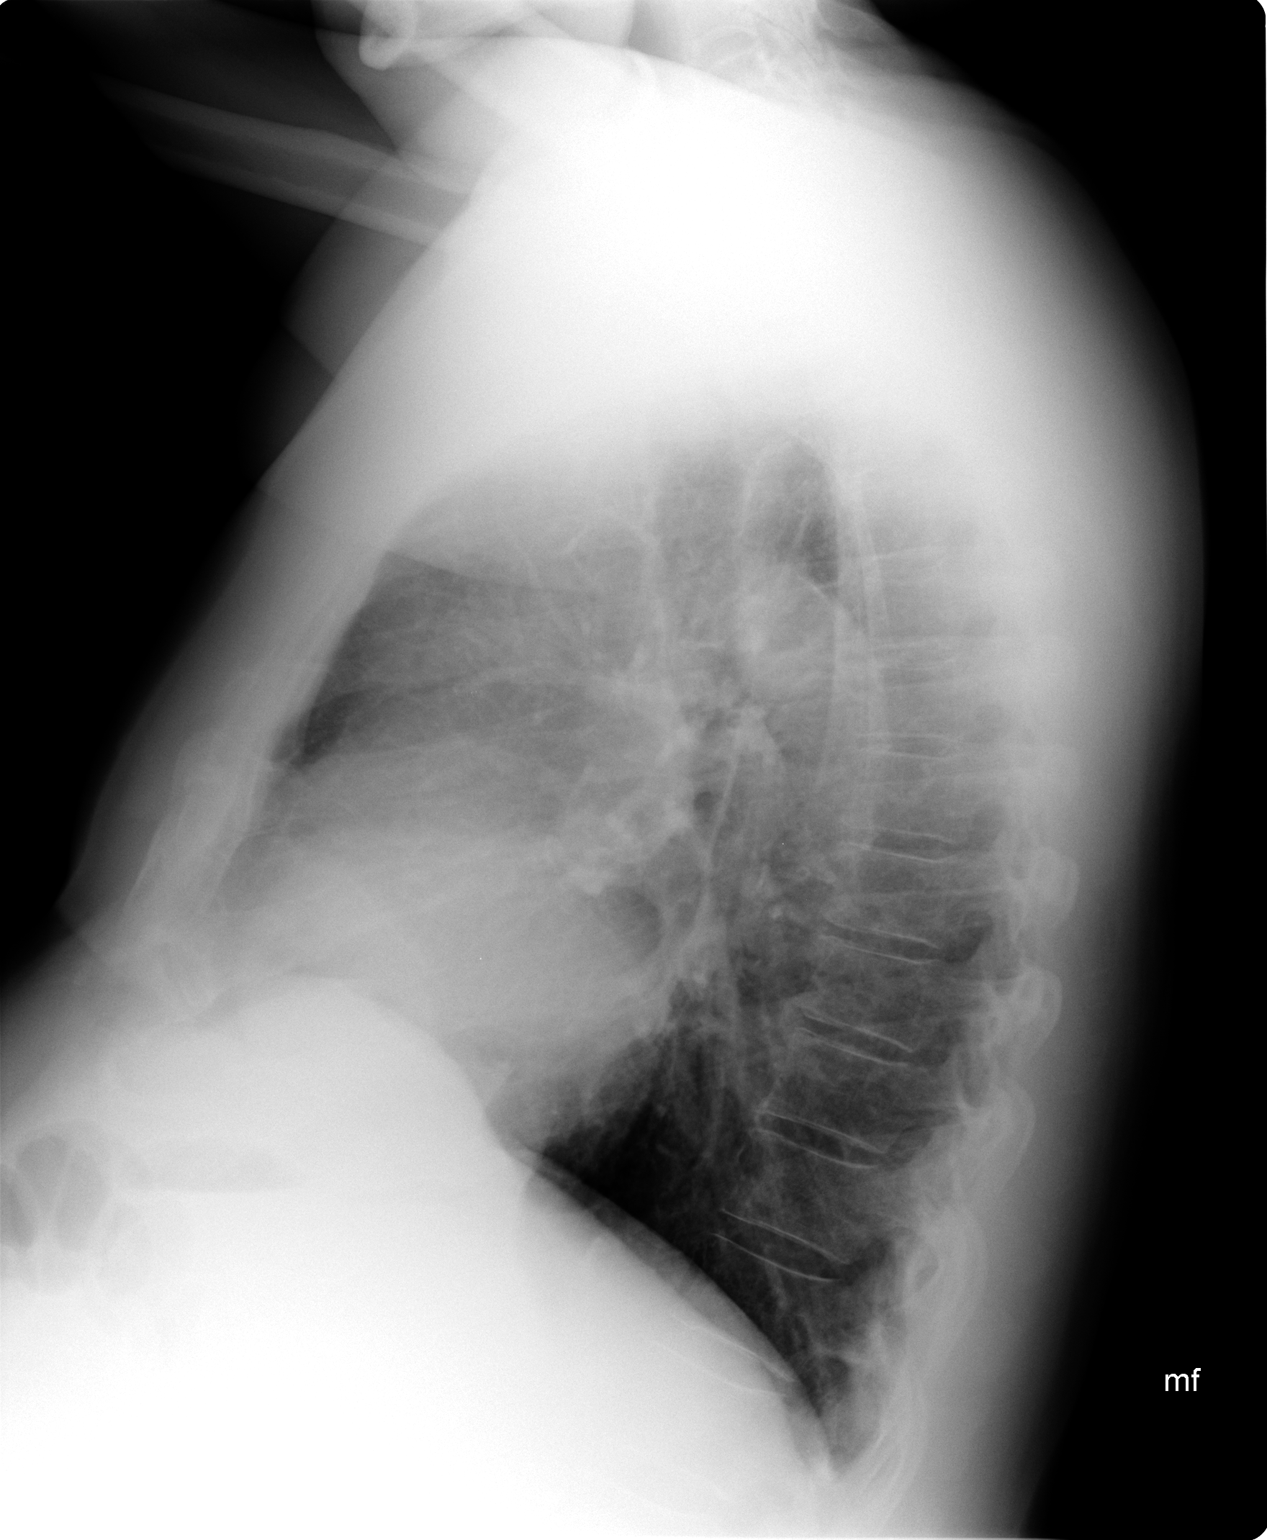

[2 of 2 positions shown; findings below may reference images not displayed]

FINDINGS: Heart is upper limits normal in size.  Lungs are clear.
No effusions.  No acute bony abnormality.
IMPRESSION: No active cardiopulmonary disease.

## 2013-04-20 ENCOUNTER — Encounter (HOSPITAL_COMMUNITY)
Admission: RE | Admit: 2013-04-20 | Discharge: 2013-04-20 | Disposition: A | Discharge status: Home / Self Care | Payer: Self-pay | Source: Ambulatory Visit | Attending: Cardiology | Admitting: Cardiology

## 2013-04-22 ENCOUNTER — Encounter (HOSPITAL_COMMUNITY)
Admission: RE | Admit: 2013-04-22 | Discharge: 2013-04-22 | Disposition: A | Discharge status: Home / Self Care | Payer: Self-pay | Source: Ambulatory Visit | Attending: Internal Medicine | Admitting: Internal Medicine

## 2013-04-22 DIAGNOSIS — D235 Other benign neoplasm of skin of trunk: Secondary | ICD-10-CM | POA: Diagnosis not present

## 2013-04-22 DIAGNOSIS — L820 Inflamed seborrheic keratosis: Secondary | ICD-10-CM | POA: Diagnosis not present

## 2013-04-22 DIAGNOSIS — L570 Actinic keratosis: Secondary | ICD-10-CM | POA: Diagnosis not present

## 2013-04-23 ENCOUNTER — Encounter (HOSPITAL_COMMUNITY): Payer: Self-pay

## 2013-04-27 ENCOUNTER — Encounter (HOSPITAL_COMMUNITY)
Admission: RE | Admit: 2013-04-27 | Discharge: 2013-04-27 | Disposition: A | Discharge status: Home / Self Care | Payer: Self-pay | Source: Ambulatory Visit | Attending: Cardiology | Admitting: Cardiology

## 2013-04-29 ENCOUNTER — Encounter (HOSPITAL_COMMUNITY)
Admission: RE | Admit: 2013-04-29 | Discharge: 2013-04-29 | Disposition: A | Discharge status: Home / Self Care | Payer: Self-pay | Source: Ambulatory Visit | Attending: Internal Medicine | Admitting: Internal Medicine

## 2013-04-30 ENCOUNTER — Encounter (HOSPITAL_COMMUNITY)
Admission: RE | Admit: 2013-04-30 | Discharge: 2013-04-30 | Disposition: A | Discharge status: Home / Self Care | Payer: Self-pay | Source: Ambulatory Visit | Attending: Internal Medicine | Admitting: Internal Medicine

## 2013-05-04 ENCOUNTER — Encounter (HOSPITAL_COMMUNITY): Admission: RE | Admit: 2013-05-04 | Payer: Self-pay | Source: Ambulatory Visit

## 2013-05-04 DIAGNOSIS — I209 Angina pectoris, unspecified: Secondary | ICD-10-CM | POA: Insufficient documentation

## 2013-05-04 DIAGNOSIS — E663 Overweight: Secondary | ICD-10-CM | POA: Insufficient documentation

## 2013-05-04 DIAGNOSIS — I2510 Atherosclerotic heart disease of native coronary artery without angina pectoris: Secondary | ICD-10-CM | POA: Insufficient documentation

## 2013-05-04 DIAGNOSIS — E785 Hyperlipidemia, unspecified: Secondary | ICD-10-CM | POA: Insufficient documentation

## 2013-05-04 DIAGNOSIS — I10 Essential (primary) hypertension: Secondary | ICD-10-CM | POA: Insufficient documentation

## 2013-05-04 DIAGNOSIS — Z9861 Coronary angioplasty status: Secondary | ICD-10-CM | POA: Insufficient documentation

## 2013-05-04 DIAGNOSIS — E8881 Metabolic syndrome: Secondary | ICD-10-CM | POA: Insufficient documentation

## 2013-05-04 DIAGNOSIS — Z5189 Encounter for other specified aftercare: Secondary | ICD-10-CM | POA: Insufficient documentation

## 2013-05-04 DIAGNOSIS — R0602 Shortness of breath: Secondary | ICD-10-CM | POA: Insufficient documentation

## 2013-05-04 DIAGNOSIS — Z7982 Long term (current) use of aspirin: Secondary | ICD-10-CM | POA: Insufficient documentation

## 2013-05-06 ENCOUNTER — Encounter (HOSPITAL_COMMUNITY)
Admission: RE | Admit: 2013-05-06 | Discharge: 2013-05-06 | Disposition: A | Discharge status: Home / Self Care | Payer: Self-pay | Source: Ambulatory Visit | Attending: Internal Medicine | Admitting: Internal Medicine

## 2013-05-07 ENCOUNTER — Encounter (HOSPITAL_COMMUNITY)
Admission: RE | Admit: 2013-05-07 | Discharge: 2013-05-07 | Disposition: A | Discharge status: Home / Self Care | Payer: Self-pay | Source: Ambulatory Visit | Attending: Cardiology | Admitting: Cardiology

## 2013-05-11 ENCOUNTER — Encounter (HOSPITAL_COMMUNITY)
Admission: RE | Admit: 2013-05-11 | Discharge: 2013-05-11 | Disposition: A | Discharge status: Home / Self Care | Payer: Self-pay | Source: Ambulatory Visit | Attending: Internal Medicine | Admitting: Internal Medicine

## 2013-05-12 ENCOUNTER — Other Ambulatory Visit (INDEPENDENT_AMBULATORY_CARE_PROVIDER_SITE_OTHER): Payer: Medicare Other

## 2013-05-12 DIAGNOSIS — E785 Hyperlipidemia, unspecified: Secondary | ICD-10-CM

## 2013-05-12 DIAGNOSIS — I10 Essential (primary) hypertension: Secondary | ICD-10-CM

## 2013-05-12 LAB — LIPID PANEL
Cholesterol: 109 mg/dL (ref 0–200)
HDL: 42.8 mg/dL (ref >39.00)
LDL Cholesterol: 36 mg/dL (ref 0–99)
Total CHOL/HDL Ratio: 3
Triglycerides: 149 mg/dL (ref 0.0–149.0)
VLDL: 29.8 mg/dL (ref 0.0–40.0)

## 2013-05-12 LAB — HEPATIC FUNCTION PANEL
ALT: 21 U/L (ref 0–53)
AST: 23 U/L (ref 0–37)
Albumin: 4 g/dL (ref 3.5–5.2)
Alkaline Phosphatase: 70 U/L (ref 39–117)
Bilirubin, Direct: 0 mg/dL (ref 0.0–0.3)
Total Bilirubin: 0.7 mg/dL (ref 0.3–1.2)
Total Protein: 6.9 g/dL (ref 6.0–8.3)

## 2013-05-12 LAB — BASIC METABOLIC PANEL
BUN: 12 mg/dL (ref 6–23)
CO2: 28 mEq/L (ref 19–32)
Calcium: 8.9 mg/dL (ref 8.4–10.5)
Chloride: 106 mEq/L (ref 96–112)
Creatinine, Ser: 0.9 mg/dL (ref 0.4–1.5)
GFR: 92.82 mL/min (ref >60.00)
Glucose, Bld: 105 mg/dL — ABNORMAL HIGH (ref 70–99)
Potassium: 3.7 mEq/L (ref 3.5–5.1)
Sodium: 140 mEq/L (ref 135–145)

## 2013-05-12 LAB — CBC WITH DIFFERENTIAL/PLATELET
Basophils Absolute: 0 10*3/uL (ref 0.0–0.1)
Basophils Relative: 0.4 % (ref 0.0–3.0)
Eosinophils Absolute: 0.2 10*3/uL (ref 0.0–0.7)
Eosinophils Relative: 2 % (ref 0.0–5.0)
HCT: 42.8 % (ref 39.0–52.0)
Hemoglobin: 14.5 g/dL (ref 13.0–17.0)
Lymphocytes Relative: 28.1 % (ref 12.0–46.0)
Lymphs Abs: 2.4 10*3/uL (ref 0.7–4.0)
MCHC: 34 g/dL (ref 30.0–36.0)
MCV: 88.1 fl (ref 78.0–100.0)
Monocytes Absolute: 0.8 10*3/uL (ref 0.1–1.0)
Monocytes Relative: 8.8 % (ref 3.0–12.0)
Neutro Abs: 5.2 10*3/uL (ref 1.4–7.7)
Neutrophils Relative %: 60.7 % (ref 43.0–77.0)
Platelets: 200 10*3/uL (ref 150.0–400.0)
RBC: 4.86 Mil/uL (ref 4.22–5.81)
RDW: 13.7 % (ref 11.5–14.6)
WBC: 8.6 10*3/uL (ref 4.5–10.5)

## 2013-05-13 ENCOUNTER — Encounter (HOSPITAL_COMMUNITY)
Admission: RE | Admit: 2013-05-13 | Discharge: 2013-05-13 | Disposition: A | Discharge status: Home / Self Care | Payer: Self-pay | Source: Ambulatory Visit | Attending: Internal Medicine | Admitting: Internal Medicine

## 2013-05-13 DIAGNOSIS — H2510 Age-related nuclear cataract, unspecified eye: Secondary | ICD-10-CM | POA: Diagnosis not present

## 2013-05-13 DIAGNOSIS — H26019 Infantile and juvenile cortical, lamellar, or zonular cataract, unspecified eye: Secondary | ICD-10-CM | POA: Diagnosis not present

## 2013-05-14 ENCOUNTER — Encounter (HOSPITAL_COMMUNITY): Payer: Self-pay

## 2013-05-17 ENCOUNTER — Ambulatory Visit (INDEPENDENT_AMBULATORY_CARE_PROVIDER_SITE_OTHER): Payer: Medicare Other | Admitting: Cardiology

## 2013-05-17 ENCOUNTER — Encounter: Payer: Self-pay | Admitting: Cardiology

## 2013-05-17 VITALS — BP 158/84 | HR 64 | Ht 68.0 in | Wt 191.8 lb

## 2013-05-17 DIAGNOSIS — I4949 Other premature depolarization: Secondary | ICD-10-CM | POA: Diagnosis not present

## 2013-05-17 DIAGNOSIS — I10 Essential (primary) hypertension: Secondary | ICD-10-CM | POA: Diagnosis not present

## 2013-05-17 DIAGNOSIS — E785 Hyperlipidemia, unspecified: Secondary | ICD-10-CM

## 2013-05-17 DIAGNOSIS — I2510 Atherosclerotic heart disease of native coronary artery without angina pectoris: Secondary | ICD-10-CM | POA: Diagnosis not present

## 2013-05-17 MED ORDER — LOSARTAN POTASSIUM 100 MG PO TABS: 100.0000 mg | ORAL_TABLET | Freq: Every day | ORAL | Status: DC

## 2013-05-17 NOTE — Patient Instructions (Signed)
Switch lisinopril to losartan 100 mg daily  Continue your other therapy  I will see you in 6 months

## 2013-05-17 NOTE — Progress Notes (Addendum)
Jacob Curry Date of Birth: Jan 29, 1941 Medical Record #[id number]  History of Present Illness: Jacob Curry is seen for followup. He has a known history of coronary disease with cardiac catheterization in May of 2011 showing total occlusion of a large left circumflex vessel with good left to left collaterals. Ejection fraction was 50-55%. He has been managed medically. He has a history of hypertension, hyperlipidemia, and diabetes. He has a history of moderate obstructive sleep apnea. This is managed with an oral appliance. He was unable to tolerate CPAP therapy. On his last visit he was recovering from an episode of bronchitis. Despite good recovery from this he still has a persistent cough mostly at night. He denies any chest pain or shortness of breath. He reports her blood pressure readings at cardiac rehabilitation have been consistently normal.  Current Outpatient Prescriptions on File Prior to Visit  Medication Sig Dispense Refill  . aspirin 81 MG tablet Take 81 mg by mouth daily.        . carvedilol (COREG) 12.5 MG tablet Take 1 & 1/2 Tablets twice daily with meals  270 tablet  2  . clopidogrel (PLAVIX) 75 MG tablet Take one tablet by mouth one time daily  90 tablet  0  . fish oil-omega-3 fatty acids 1000 MG capsule Take 2 g by mouth daily.        . metFORMIN (GLUCOPHAGE) 500 MG tablet TAKE ONE TABLET BY MOUTH TWO TIMES A DAY WITH 2 LARGEST MEALS  180 tablet  1  . Multiple Vitamin (MULTIVITAMIN) tablet Take 1 tablet by mouth daily.      . nitroGLYCERIN (NITROSTAT) 0.4 MG SL tablet Place 1 tablet (0.4 mg total) under the tongue every 5 (five) minutes as needed.  25 tablet  prn  . promethazine-codeine (PHENERGAN WITH CODEINE) 6.25-10 MG/5ML syrup Take 1-2 teaspoons every 4-6 hours as needed for cough. May cause drowsiness.  120 mL  0  . Saw Palmetto, Serenoa repens, (SAW PALMETTO PO) Take 1 tablet by mouth daily.        . simvastatin (ZOCOR) 40 MG tablet Take 1 tablet (40 mg total) by mouth at  bedtime.  90 tablet  3   No current facility-administered medications on file prior to visit.    Allergies  Allergen Reactions  . Acetaminophen     Drug induced hepatitis  . Oxycodone-Acetaminophen     Drug induced hepatitis    Past Medical History  Diagnosis Date  . CAD (coronary artery disease)     Dr Alroy Portela [place]  . HTN (hypertension)   . HLD (hyperlipidemia)   . DM (diabetes mellitus)   . ED (erectile dysfunction)   . Carotid stenosis   . Obesity   . OSA (obstructive sleep apnea)     oral appliance, Dr Myrtis Ser    Past Surgical History  Procedure Laterality Date  . Appendectomy  1958  . Ankle fracture surgery  1993  . Hand surgery  1965  . Tonsillectomy    . Cardiac catheterization  2007 & 2011  . Colonoscopy  2002    negative; Dr Juanda Chance    History  Smoking status  . Former Smoker -- 1.00 packs/day  . Quit date: 09/02/1990  Smokeless tobacco  . Never Used    Comment: onset age [age] up to 1 ppd    History  Alcohol Use No    Family History  Problem Relation Age of Onset  . Diabetes Mother   . Colon cancer Paternal Uncle   .  Heart attack Father      4 vessel CBAG @ 88  . Diabetes Brother   . Stroke Neg Hx     Review of Systems: As noted in history of present illness.  All other systems were reviewed and are negative.  Physical Exam: BP 158/84  Pulse 64  Ht 5\' 8"  (1.727 m)  Wt 191 lb 12.8 oz (87 kg)  BMI 29.17 kg/m2 He is a pleasant white male in no acute distress. HEENT exam is unremarkable. He has no jugular venous distention or bruits. There is no adenopathy or thyromegaly. Cardiac exam reveals a regular rate and rhythm without gallop, murmur, or click. PMI is normal. Lungs are clear. Abdomen is soft and nontender without masses or bruits. He has normal bowel sounds. Extremities are without cyanosis or edema. Pedal pulses are 2+ and symmetric. He is alert and oriented x3. Cranial nerves II through XII are intact. He has no focal  findings. LABORATORY DATA: Lab Results  Component Value Date   WBC 8.6 05/12/2013   HGB 14.5 05/12/2013   HCT 42.8 05/12/2013   PLT 200.0 05/12/2013   GLUCOSE 105* 05/12/2013   CHOL 109 05/12/2013   TRIG 149.0 05/12/2013   HDL 42.80 05/12/2013   LDLDIRECT 39.9 07/22/2008   LDLCALC 36 05/12/2013   ALT 21 05/12/2013   AST 23 05/12/2013   NA 140 05/12/2013   K 3.7 05/12/2013   CL 106 05/12/2013   CREATININE 0.9 05/12/2013   BUN 12 05/12/2013   CO2 28 05/12/2013   TSH 2.27 03/09/2012   PSA 1.70 01/04/2009   INR 1.1 ratio* 01/15/2010   HGBA1C 6.5 10/19/2012   MICROALBUR 0.4 03/09/2012   ECG today demonstrates normal sinus rhythm with QS complex in leads V1 through V3. Otherwise normal.  Assessment / Plan: 1. 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 Patient is asymptomatic on medical therapy.   2. Hypertension, controlled per cardiac rehabilitation notes.  3. Hyperlipidemia well controlled on medication. We had discontinued his Niaspan one month ago based on recent data. His lipids still looked excellent.  4. Persistent cough. I recommended switching lisinopril to losartan 100 mg daily.

## 2013-05-18 ENCOUNTER — Encounter (HOSPITAL_COMMUNITY)
Admission: RE | Admit: 2013-05-18 | Discharge: 2013-05-18 | Disposition: A | Discharge status: Home / Self Care | Payer: Self-pay | Source: Ambulatory Visit | Attending: Cardiology | Admitting: Cardiology

## 2013-05-19 DIAGNOSIS — H26019 Infantile and juvenile cortical, lamellar, or zonular cataract, unspecified eye: Secondary | ICD-10-CM | POA: Diagnosis not present

## 2013-05-19 DIAGNOSIS — H2510 Age-related nuclear cataract, unspecified eye: Secondary | ICD-10-CM | POA: Diagnosis not present

## 2013-05-20 ENCOUNTER — Encounter (HOSPITAL_COMMUNITY): Payer: Self-pay

## 2013-05-21 ENCOUNTER — Encounter (HOSPITAL_COMMUNITY): Payer: Self-pay

## 2013-05-25 ENCOUNTER — Encounter (HOSPITAL_COMMUNITY)
Admission: RE | Admit: 2013-05-25 | Discharge: 2013-05-25 | Disposition: A | Discharge status: Home / Self Care | Payer: Self-pay | Source: Ambulatory Visit | Attending: Internal Medicine | Admitting: Internal Medicine

## 2013-05-26 DIAGNOSIS — H26019 Infantile and juvenile cortical, lamellar, or zonular cataract, unspecified eye: Secondary | ICD-10-CM | POA: Diagnosis not present

## 2013-05-26 DIAGNOSIS — H2510 Age-related nuclear cataract, unspecified eye: Secondary | ICD-10-CM | POA: Diagnosis not present

## 2013-05-27 ENCOUNTER — Encounter (HOSPITAL_COMMUNITY): Payer: Self-pay

## 2013-05-28 ENCOUNTER — Encounter (HOSPITAL_COMMUNITY): Payer: Self-pay

## 2013-06-01 ENCOUNTER — Encounter (HOSPITAL_COMMUNITY): Payer: Self-pay

## 2013-06-03 ENCOUNTER — Encounter (HOSPITAL_COMMUNITY)
Admission: RE | Admit: 2013-06-03 | Discharge: 2013-06-03 | Disposition: A | Discharge status: Home / Self Care | Payer: Self-pay | Source: Ambulatory Visit | Attending: Internal Medicine | Admitting: Internal Medicine

## 2013-06-03 DIAGNOSIS — E8881 Metabolic syndrome: Secondary | ICD-10-CM | POA: Insufficient documentation

## 2013-06-03 DIAGNOSIS — Z9861 Coronary angioplasty status: Secondary | ICD-10-CM | POA: Insufficient documentation

## 2013-06-03 DIAGNOSIS — I209 Angina pectoris, unspecified: Secondary | ICD-10-CM | POA: Insufficient documentation

## 2013-06-03 DIAGNOSIS — R0602 Shortness of breath: Secondary | ICD-10-CM | POA: Insufficient documentation

## 2013-06-03 DIAGNOSIS — Z7982 Long term (current) use of aspirin: Secondary | ICD-10-CM | POA: Insufficient documentation

## 2013-06-03 DIAGNOSIS — I2510 Atherosclerotic heart disease of native coronary artery without angina pectoris: Secondary | ICD-10-CM | POA: Insufficient documentation

## 2013-06-03 DIAGNOSIS — I10 Essential (primary) hypertension: Secondary | ICD-10-CM | POA: Insufficient documentation

## 2013-06-03 DIAGNOSIS — E785 Hyperlipidemia, unspecified: Secondary | ICD-10-CM | POA: Insufficient documentation

## 2013-06-03 DIAGNOSIS — Z5189 Encounter for other specified aftercare: Secondary | ICD-10-CM | POA: Insufficient documentation

## 2013-06-03 DIAGNOSIS — E663 Overweight: Secondary | ICD-10-CM | POA: Insufficient documentation

## 2013-06-04 ENCOUNTER — Encounter (HOSPITAL_COMMUNITY)
Admission: RE | Admit: 2013-06-04 | Discharge: 2013-06-04 | Disposition: A | Discharge status: Home / Self Care | Payer: Self-pay | Source: Ambulatory Visit | Attending: Cardiology | Admitting: Cardiology

## 2013-06-08 ENCOUNTER — Encounter (HOSPITAL_COMMUNITY)
Admission: RE | Admit: 2013-06-08 | Discharge: 2013-06-08 | Disposition: A | Discharge status: Home / Self Care | Payer: Self-pay | Source: Ambulatory Visit | Attending: Internal Medicine | Admitting: Internal Medicine

## 2013-06-10 ENCOUNTER — Encounter (HOSPITAL_COMMUNITY)
Admission: RE | Admit: 2013-06-10 | Discharge: 2013-06-10 | Disposition: A | Discharge status: Home / Self Care | Payer: Self-pay | Source: Ambulatory Visit | Attending: Internal Medicine | Admitting: Internal Medicine

## 2013-06-11 ENCOUNTER — Encounter (HOSPITAL_COMMUNITY)
Admission: RE | Admit: 2013-06-11 | Discharge: 2013-06-11 | Disposition: A | Discharge status: Home / Self Care | Payer: Self-pay | Source: Ambulatory Visit | Attending: Cardiology | Admitting: Cardiology

## 2013-06-15 ENCOUNTER — Encounter (HOSPITAL_COMMUNITY): Payer: Medicare Other

## 2013-06-17 ENCOUNTER — Encounter (HOSPITAL_COMMUNITY): Payer: Medicare Other

## 2013-06-18 ENCOUNTER — Encounter (HOSPITAL_COMMUNITY): Payer: Medicare Other

## 2013-06-22 ENCOUNTER — Encounter (HOSPITAL_COMMUNITY)
Admission: RE | Admit: 2013-06-22 | Discharge: 2013-06-22 | Disposition: A | Discharge status: Home / Self Care | Payer: Self-pay | Source: Ambulatory Visit | Attending: Internal Medicine | Admitting: Internal Medicine

## 2013-06-24 ENCOUNTER — Encounter (HOSPITAL_COMMUNITY)
Admission: RE | Admit: 2013-06-24 | Discharge: 2013-06-24 | Disposition: A | Discharge status: Home / Self Care | Payer: Self-pay | Source: Ambulatory Visit | Attending: Internal Medicine | Admitting: Internal Medicine

## 2013-06-25 ENCOUNTER — Encounter (HOSPITAL_COMMUNITY): Payer: Medicare Other

## 2013-06-29 ENCOUNTER — Encounter (HOSPITAL_COMMUNITY): Payer: Medicare Other

## 2013-07-01 ENCOUNTER — Encounter (HOSPITAL_COMMUNITY): Payer: Medicare Other

## 2013-07-02 ENCOUNTER — Encounter (HOSPITAL_COMMUNITY): Payer: Medicare Other

## 2013-07-05 DIAGNOSIS — J209 Acute bronchitis, unspecified: Secondary | ICD-10-CM | POA: Diagnosis not present

## 2013-07-06 ENCOUNTER — Encounter (HOSPITAL_COMMUNITY): Payer: Self-pay

## 2013-07-06 DIAGNOSIS — I10 Essential (primary) hypertension: Secondary | ICD-10-CM | POA: Insufficient documentation

## 2013-07-06 DIAGNOSIS — E663 Overweight: Secondary | ICD-10-CM | POA: Insufficient documentation

## 2013-07-06 DIAGNOSIS — E785 Hyperlipidemia, unspecified: Secondary | ICD-10-CM | POA: Insufficient documentation

## 2013-07-06 DIAGNOSIS — R0602 Shortness of breath: Secondary | ICD-10-CM | POA: Insufficient documentation

## 2013-07-06 DIAGNOSIS — E8881 Metabolic syndrome: Secondary | ICD-10-CM | POA: Insufficient documentation

## 2013-07-06 DIAGNOSIS — Z7982 Long term (current) use of aspirin: Secondary | ICD-10-CM | POA: Insufficient documentation

## 2013-07-06 DIAGNOSIS — Z9861 Coronary angioplasty status: Secondary | ICD-10-CM | POA: Insufficient documentation

## 2013-07-06 DIAGNOSIS — I2510 Atherosclerotic heart disease of native coronary artery without angina pectoris: Secondary | ICD-10-CM | POA: Insufficient documentation

## 2013-07-06 DIAGNOSIS — I209 Angina pectoris, unspecified: Secondary | ICD-10-CM | POA: Insufficient documentation

## 2013-07-06 DIAGNOSIS — Z5189 Encounter for other specified aftercare: Secondary | ICD-10-CM | POA: Insufficient documentation

## 2013-07-08 ENCOUNTER — Encounter (HOSPITAL_COMMUNITY): Payer: Self-pay

## 2013-07-08 ENCOUNTER — Other Ambulatory Visit: Payer: Self-pay

## 2013-07-08 ENCOUNTER — Ambulatory Visit (INDEPENDENT_AMBULATORY_CARE_PROVIDER_SITE_OTHER): Payer: Medicare Other | Admitting: Internal Medicine

## 2013-07-08 ENCOUNTER — Encounter: Payer: Self-pay | Admitting: Internal Medicine

## 2013-07-08 VITALS — BP 146/80 | HR 97 | Temp 98.3°F

## 2013-07-08 DIAGNOSIS — J209 Acute bronchitis, unspecified: Secondary | ICD-10-CM

## 2013-07-08 MED ORDER — PREDNISONE 20 MG PO TABS: 20.0000 mg | ORAL_TABLET | Freq: Two times a day (BID) | ORAL | Status: DC

## 2013-07-08 MED ORDER — AZITHROMYCIN 250 MG PO TABS: ORAL_TABLET | ORAL | Status: DC

## 2013-07-08 NOTE — Progress Notes (Signed)
  Subjective:    Patient ID: Jacob Curry, male    DOB: 30-Mar-1941, 72 y.o.   MRN: [id number]  HPI  Symptoms began 13 days ago as a dry cough which has persisted and which is worse at night. There were no specific triggers for initiating factors for the cough. Cough has impaired sleep resulting in fatigue. He has some left sided thoracic pain with the cough but no true pleurisy.  He was prescribed Tessalon Perles and doxycycline at an urgent care in [facility]  11/4 w/o response.    Smoking:quit 1989 ACE inhibitor:no Treatment/efficacy: Past medical history/family history pulmonary disease:no    Review of Systems The been no associated extrinsic symptoms of itchy, watery eyes, sneezing. He also denies fever, chills, or sweats.  The cough is not associated with shortness of breath and wheezing   There've been no signs of rhinosinusitis as frontal headache, facial pain, or nasal purulence .     Objective:   Physical Exam General appearance:good health ;well nourished; no acute distress or increased work of breathing is present.  No  lymphadenopathy about the head, neck, or axilla noted.   Eyes: No conjunctival inflammation or lid edema is present.   Ears:  External ear exam shows no significant lesions or deformities.  Otoscopic examination reveals clear canals, tympanic membranes are intact bilaterally without bulging, retraction, inflammation or discharge.  Nose:  External nasal examination shows no deformity or inflammation. Nasal mucosa are pink and moist without lesions or exudates. No septal dislocation or deviation.No obstruction to airflow.   Oral exam: Dental hygiene is good; lips and gums are healthy appearing.There is no oropharyngeal erythema or exudate noted.   Neck:  No deformities, masses, or tenderness noted.   Heart:  Normal rate and regular rhythm. S1 and S2 normal without gallop, murmur, click, rub or other extra sounds.   Lungs:Chest clear to auscultation; no  wheezes, rhonchi,rales ,or rubs present.No increased work of breathing.  Dry cough  Extremities:  No cyanosis, edema, or clubbing  noted    Skin: Warm & dry .         Assessment & Plan:  #1 acute bronchitis w/o bronchospasm  Plan: See orders and recommendations

## 2013-07-08 NOTE — Patient Instructions (Signed)
Your next office appointment will be determined based upon response to meds.  Followup as needed for your this acute issue. Please report any significant change in your symptoms. 

## 2013-07-09 ENCOUNTER — Encounter (HOSPITAL_COMMUNITY): Payer: Self-pay

## 2013-07-13 ENCOUNTER — Other Ambulatory Visit (INDEPENDENT_AMBULATORY_CARE_PROVIDER_SITE_OTHER): Payer: Medicare Other

## 2013-07-13 ENCOUNTER — Telehealth: Payer: Self-pay | Admitting: Internal Medicine

## 2013-07-13 ENCOUNTER — Ambulatory Visit (HOSPITAL_BASED_OUTPATIENT_CLINIC_OR_DEPARTMENT_OTHER)
Admission: RE | Admit: 2013-07-13 | Discharge: 2013-07-13 | Disposition: A | Discharge status: Home / Self Care | Payer: Medicare Other | Source: Ambulatory Visit | Attending: Internal Medicine | Admitting: Internal Medicine

## 2013-07-13 ENCOUNTER — Encounter (HOSPITAL_COMMUNITY): Payer: Self-pay

## 2013-07-13 ENCOUNTER — Encounter: Payer: Self-pay | Admitting: Internal Medicine

## 2013-07-13 ENCOUNTER — Telehealth: Payer: Self-pay | Admitting: *Deleted

## 2013-07-13 ENCOUNTER — Other Ambulatory Visit: Payer: Self-pay | Admitting: Internal Medicine

## 2013-07-13 ENCOUNTER — Other Ambulatory Visit: Payer: Self-pay | Admitting: Cardiology

## 2013-07-13 ENCOUNTER — Other Ambulatory Visit: Payer: Self-pay | Admitting: *Deleted

## 2013-07-13 ENCOUNTER — Ambulatory Visit (INDEPENDENT_AMBULATORY_CARE_PROVIDER_SITE_OTHER): Payer: Medicare Other | Admitting: Internal Medicine

## 2013-07-13 VITALS — BP 143/80 | HR 96 | Temp 98.4°F | Wt 196.6 lb

## 2013-07-13 DIAGNOSIS — R059 Cough, unspecified: Secondary | ICD-10-CM | POA: Insufficient documentation

## 2013-07-13 DIAGNOSIS — R05 Cough: Secondary | ICD-10-CM | POA: Diagnosis not present

## 2013-07-13 DIAGNOSIS — I4891 Unspecified atrial fibrillation: Secondary | ICD-10-CM | POA: Diagnosis not present

## 2013-07-13 DIAGNOSIS — D72829 Elevated white blood cell count, unspecified: Secondary | ICD-10-CM

## 2013-07-13 DIAGNOSIS — I499 Cardiac arrhythmia, unspecified: Secondary | ICD-10-CM | POA: Diagnosis not present

## 2013-07-13 DIAGNOSIS — J209 Acute bronchitis, unspecified: Secondary | ICD-10-CM

## 2013-07-13 LAB — CBC WITH DIFFERENTIAL/PLATELET
Basophils Absolute: 0.1 10*3/uL (ref 0.0–0.1)
Basophils Relative: 0.4 % (ref 0.0–3.0)
Eosinophils Absolute: 0 10*3/uL (ref 0.0–0.7)
Eosinophils Relative: 0.2 % (ref 0.0–5.0)
HCT: 41.7 % (ref 39.0–52.0)
Hemoglobin: 14.2 g/dL (ref 13.0–17.0)
Lymphocytes Relative: 21.9 % (ref 12.0–46.0)
Lymphs Abs: 3.9 10*3/uL (ref 0.7–4.0)
MCHC: 34.1 g/dL (ref 30.0–36.0)
MCV: 87.2 fl (ref 78.0–100.0)
Monocytes Absolute: 1.5 10*3/uL — ABNORMAL HIGH (ref 0.1–1.0)
Monocytes Relative: 8.2 % (ref 3.0–12.0)
Neutro Abs: 12.5 10*3/uL — ABNORMAL HIGH (ref 1.4–7.7)
Neutrophils Relative %: 69.3 % (ref 43.0–77.0)
Platelets: 281 10*3/uL (ref 150.0–400.0)
RBC: 4.78 Mil/uL (ref 4.22–5.81)
RDW: 13.8 % (ref 11.5–14.6)
WBC: 18 10*3/uL (ref 4.5–10.5)

## 2013-07-13 MED ORDER — LEVOFLOXACIN 500 MG PO TABS: 500.0000 mg | ORAL_TABLET | Freq: Every day | ORAL | Status: DC

## 2013-07-13 MED ORDER — FLUTICASONE-SALMETEROL 500-50 MCG/DOSE IN AEPB: 1.0000 | INHALATION_SPRAY | Freq: Two times a day (BID) | RESPIRATORY_TRACT | Status: DC

## 2013-07-13 MED ORDER — CEFUROXIME AXETIL 500 MG PO TABS: 500.0000 mg | ORAL_TABLET | Freq: Two times a day (BID) | ORAL | Status: DC

## 2013-07-13 NOTE — Telephone Encounter (Signed)
Called and informed patient of lab work that he needs to get done at [facility]. He verbalized his understanding

## 2013-07-13 NOTE — Patient Instructions (Addendum)
Advair one  inhalation every 12 hours; gargle and spit after use.To help prevent cardiac rhythm  avoid stimulants such as decongestants, diet pills, nicotine, or caffeine (coffee, tea, cola, or chocolate) to excess.

## 2013-07-13 NOTE — Telephone Encounter (Signed)
Patient states that the cough he came in for last week has gotten worse. Yesterday was the last day on his Z-pack. Wants to know what he should do. Please advise.

## 2013-07-13 NOTE — Telephone Encounter (Signed)
See orders &@ [facility]

## 2013-07-13 NOTE — Telephone Encounter (Signed)
Called and spoke with patient, he is able to come to 445 appt today. He is going to get CXR done at [facility] before appt.

## 2013-07-13 NOTE — Progress Notes (Signed)
  Subjective:    Patient ID: Jacob Curry, male    DOB: [birth year], 72 y.o.   MRN: [id number]  HPI  He has completed a Z-Pak and has 4 prednisone left. He continues to have a nonproductive cough. His wife states that she has noted some wheezing.  White blood count is 18,000.  He has not smoked since 1992. He has no history of asthma.  Chest x-rays were reviewed; there is no infiltrate or active cardiopulmonary process.    Review of Systems He is not having fever, chills, or sweats. There's been no purulent sputum or hemoptysis.  He denies frontal headache, facial pain, nasal purulence, or other signs of rhinosinusitis.  He also denies any chest pain or palpitations.     Objective:   Physical Exam General appearance: appears fatigued but in good health ;well nourished; no acute distress or increased work of breathing is present.  No  lymphadenopathy about the head, neck, or axilla noted.   Eyes: No conjunctival inflammation or lid edema is present. There is no scleral icterus.  Ears:  External ear exam shows no significant lesions or deformities.  Otoscopic examination reveals clear canals, tympanic membranes are intact bilaterally without bulging, retraction, inflammation or discharge.  Nose:  External nasal examination shows no deformity or inflammation. Nasal mucosa are pink and moist without lesions or exudates. No septal dislocation or deviation.No obstruction to airflow.   Oral exam: Dental hygiene is good; lips and gums are healthy appearing.There is no oropharyngeal erythema or exudate noted.   Neck:  No deformities,  masses, or tenderness noted.   Supple with full range of motion without pain.   Heart:  Irregular  rate and irregular rhythm. S1 and S2 normal without gallop, murmur, click, rub or other extra sounds.   Lungs:Chest clear to auscultation; no wheezes, rhonchi,rales ,or rubs present.No increased work of breathing.  Decreased BS  Extremities:  No cyanosis,  edema, or clubbing  noted    Skin: Warm & dry w/o jaundice or tenting.         Assessment & Plan:  #1 acute bronchitis with protracted cough. Bronchospasm reported by his wife  #2 leukocytosis; this may be due to the oral steroids as chest x-ray shows no community acquired pneumonia or other acute process  #3 atrial fib, new onset. He is on generic Plavix which would preclude the need for warfarin or one of the new novel oral anticoagulants. Followup with his Cardiologist will be pursued as soon as possible.  See Orders

## 2013-07-13 NOTE — Telephone Encounter (Signed)
Would you like for patient to try a different medication or come in to be seen?

## 2013-07-13 NOTE — Telephone Encounter (Signed)
Received call from Clydie Braun, [place] lab, and patient's WBC is 18,000. Please advise

## 2013-07-13 NOTE — Telephone Encounter (Signed)
appt @ 4:45

## 2013-07-13 NOTE — Progress Notes (Signed)
Pre visit review using our clinic review tool, if applicable. No additional management support is needed unless otherwise documented below in the visit note. 

## 2013-07-14 ENCOUNTER — Telehealth: Payer: Self-pay | Admitting: Cardiology

## 2013-07-14 NOTE — Telephone Encounter (Signed)
Metformin refilled

## 2013-07-14 NOTE — Telephone Encounter (Signed)
New message      Saw pcp yesterday--dr wm hopper--pt said he is in afib and dr hopper want him to see dr Patty Sermons.  Pt only want dr---they said dr hopper's office was going to call us today. Pls call pt.

## 2013-07-14 NOTE — Telephone Encounter (Signed)
Returned call to patient's wife she stated patient saw Dr.Hopper and he is in atrial fib.Wife stated Dr.Hopper wanted patient seen as soon as possible.Appointment scheduled with Dr.Jordan tomorrow 07/15/13 at 12:00 noon.

## 2013-07-15 ENCOUNTER — Encounter (HOSPITAL_COMMUNITY): Payer: Self-pay

## 2013-07-15 ENCOUNTER — Ambulatory Visit (INDEPENDENT_AMBULATORY_CARE_PROVIDER_SITE_OTHER): Payer: Medicare Other | Admitting: Cardiology

## 2013-07-15 ENCOUNTER — Encounter: Payer: Self-pay | Admitting: Cardiology

## 2013-07-15 VITALS — BP 128/78 | HR 68 | Ht 68.0 in | Wt 192.4 lb

## 2013-07-15 DIAGNOSIS — I4891 Unspecified atrial fibrillation: Secondary | ICD-10-CM | POA: Diagnosis not present

## 2013-07-15 DIAGNOSIS — I10 Essential (primary) hypertension: Secondary | ICD-10-CM | POA: Diagnosis not present

## 2013-07-15 DIAGNOSIS — I2510 Atherosclerotic heart disease of native coronary artery without angina pectoris: Secondary | ICD-10-CM

## 2013-07-15 MED ORDER — APIXABAN 5 MG PO TABS: 5.0000 mg | ORAL_TABLET | Freq: Two times a day (BID) | ORAL | Status: DC

## 2013-07-15 NOTE — Progress Notes (Signed)
Jacob Curry Date of Birth: 12/21/1940 Medical Record #[id number]  History of Present Illness: Jacob Curry is seen as a work in today for evaluation of new atrial fibrillation.. He has a known history of coronary disease with cardiac catheterization in May of 2011 showing total occlusion of a large left circumflex vessel with good left to left collaterals. Ejection fraction was 50-55%. He has been managed medically. He has a history of hypertension, hyperlipidemia, and diabetes. He has a history of moderate obstructive sleep apnea. This is managed with an oral appliance. He was unable to tolerate CPAP therapy. Since late October he has been battling a severe case of bronchitis. He states he has a terrible cough. Recently when seen by Dr. Alwyn Ren he was found to be in atrial fibrillation with a rate of 92 beats per minute. He denied any symptoms of palpitations, shortness of breath, or chest pain. He may have felt a little more dizzy.  Current Outpatient Prescriptions on File Prior to Visit  Medication Sig Dispense Refill  . benzonatate (TESSALON) 200 MG capsule Take 200 mg by mouth 3 (three) times daily as needed for cough.      . carvedilol (COREG) 12.5 MG tablet Take 1 & 1/2 Tablets twice daily with meals  270 tablet  2  . fish oil-omega-3 fatty acids 1000 MG capsule Take 2 g by mouth daily.        . Fluticasone-Salmeterol (ADVAIR DISKUS) 500-50 MCG/DOSE AEPB Inhale 1 puff into the lungs 2 (two) times daily.  14 each  0  . levofloxacin (LEVAQUIN) 500 MG tablet Take 1 tablet (500 mg total) by mouth daily.  7 tablet  0  . losartan (COZAAR) 100 MG tablet Take 1 tablet (100 mg total) by mouth daily.  90 tablet  3  . Multiple Vitamin (MULTIVITAMIN) tablet Take 1 tablet by mouth daily.      . nitroGLYCERIN (NITROSTAT) 0.4 MG SL tablet Place 1 tablet (0.4 mg total) under the tongue every 5 (five) minutes as needed.  25 tablet  prn  . Saw Palmetto, Serenoa repens, (SAW PALMETTO PO) Take 1 tablet by mouth  daily.        . simvastatin (ZOCOR) 40 MG tablet Take 1 tablet (40 mg total) by mouth at bedtime.  90 tablet  3   No current facility-administered medications on file prior to visit.    Allergies  Allergen Reactions  . Acetaminophen     Drug induced hepatitis  . Oxycodone-Acetaminophen     Drug induced hepatitis    Past Medical History  Diagnosis Date  . CAD (coronary artery disease)     Dr Peter [place]  . HTN (hypertension)   . HLD (hyperlipidemia)   . DM (diabetes mellitus)   . ED (erectile dysfunction)   . Carotid stenosis   . Obesity   . OSA (obstructive sleep apnea)     oral appliance, Dr Myrtis Ser  . Paroxysmal atrial fibrillation     Past Surgical History  Procedure Laterality Date  . Appendectomy  1958  . Ankle fracture surgery  1993  . Hand surgery  1965  . Tonsillectomy    . Cardiac catheterization  2007 & 2011  . Colonoscopy  2002    negative; Dr Juanda Chance    History  Smoking status  . Former Smoker -- 1.00 packs/day  . Quit date: 09/02/1990  Smokeless tobacco  . Never Used    Comment: onset age 67-50 up to 1 ppd    History  Alcohol Use No    Family History  Problem Relation Age of Onset  . Diabetes Mother   . Colon cancer Paternal Uncle   . Heart attack Father      4 vessel CBAG @ 88  . Diabetes Brother   . Stroke Neg Hx     Review of Systems: As noted in history of present illness.  All other systems were reviewed and are negative.  Physical Exam: BP 128/78  Pulse 68  Ht 5\' 8"  (1.727 m)  Wt 192 lb 6.4 oz (87.272 kg)  BMI 29.26 kg/m2 He is a pleasant white male in no acute distress. HEENT exam is unremarkable. He has no jugular venous distention or bruits. There is no adenopathy or thyromegaly. Cardiac exam reveals a regular rate and rhythm with occasional extrasystole. There is no  gallop, murmur, or click. PMI is normal. Lungs are clear. Abdomen is soft and nontender without masses or bruits. He has normal bowel sounds. Extremities are  without cyanosis or edema. Pedal pulses are 2+ and symmetric. He is alert and oriented x3. Cranial nerves II through XII are intact. He has no focal findings. LABORATORY DATA: Lab Results  Component Value Date   WBC 18.0* 07/13/2013   HGB 14.2 07/13/2013   HCT 41.7 07/13/2013   PLT 281.0 07/13/2013   GLUCOSE 105* 05/12/2013   CHOL 109 05/12/2013   TRIG 149.0 05/12/2013   HDL 42.80 05/12/2013   LDLDIRECT 39.9 07/22/2008   LDLCALC 36 05/12/2013   ALT 21 05/12/2013   AST 23 05/12/2013   NA 140 05/12/2013   K 3.7 05/12/2013   CL 106 05/12/2013   CREATININE 0.9 05/12/2013   BUN 12 05/12/2013   CO2 28 05/12/2013   TSH 2.27 03/09/2012   PSA 1.70 01/04/2009   INR 1.1 ratio* 01/15/2010   HGBA1C 6.5 10/19/2012   MICROALBUR 0.4 03/09/2012   ECG from 07/13/2013 shows atrial fibrillation with a rate of 92 beats per minute.  ECG today demonstrates normal sinus rhythm with occasional PVCs, with QS complex in leads V1 through V3. Otherwise normal.  Assessment / Plan: 1. 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 Patient is asymptomatic on medical therapy.   2. Hypertension, controlled per cardiac rehabilitation notes.  3. Hyperlipidemia well controlled on medication. We had discontinued his Niaspan one month ago based on recent data. His lipids still looked excellent.  4. atrial fibrillation, paroxysmal. Patient is back in sinus rhythm today. His rate was controlled on beta blocker therapy. He was minimally symptomatic. He does have a high Chadvasc score of 4. I recommended anticoagulation. We will start him on Eliquis 5 mg twice a day. We will stop his antiplatelet therapy including aspirin and Plavix. We will obtain an echocardiogram.

## 2013-07-15 NOTE — Patient Instructions (Addendum)
Stop Plavix and ASA  Start Eliquis 5 mg twice a day  We will schedule you for an Echocardiogram

## 2013-07-16 ENCOUNTER — Ambulatory Visit: Payer: Medicare Other | Admitting: Cardiology

## 2013-07-16 ENCOUNTER — Encounter (HOSPITAL_COMMUNITY): Payer: Self-pay

## 2013-07-20 ENCOUNTER — Encounter (HOSPITAL_COMMUNITY)
Admission: RE | Admit: 2013-07-20 | Discharge: 2013-07-20 | Disposition: A | Discharge status: Home / Self Care | Payer: Self-pay | Source: Ambulatory Visit | Attending: Internal Medicine | Admitting: Internal Medicine

## 2013-07-22 ENCOUNTER — Encounter (HOSPITAL_COMMUNITY)
Admission: RE | Admit: 2013-07-22 | Discharge: 2013-07-22 | Disposition: A | Discharge status: Home / Self Care | Payer: Self-pay | Source: Ambulatory Visit | Attending: Internal Medicine | Admitting: Internal Medicine

## 2013-07-23 ENCOUNTER — Encounter (HOSPITAL_COMMUNITY): Payer: Self-pay

## 2013-07-27 ENCOUNTER — Encounter (HOSPITAL_COMMUNITY): Payer: Self-pay

## 2013-07-28 ENCOUNTER — Encounter (HOSPITAL_COMMUNITY)
Admission: RE | Admit: 2013-07-28 | Discharge: 2013-07-28 | Disposition: A | Discharge status: Home / Self Care | Payer: Self-pay | Source: Ambulatory Visit | Attending: Internal Medicine | Admitting: Internal Medicine

## 2013-07-28 ENCOUNTER — Ambulatory Visit (HOSPITAL_COMMUNITY): Payer: Medicare Other | Attending: Cardiology | Admitting: Cardiology

## 2013-07-28 DIAGNOSIS — I4891 Unspecified atrial fibrillation: Secondary | ICD-10-CM | POA: Diagnosis not present

## 2013-07-28 DIAGNOSIS — E669 Obesity, unspecified: Secondary | ICD-10-CM | POA: Diagnosis not present

## 2013-07-28 DIAGNOSIS — I2510 Atherosclerotic heart disease of native coronary artery without angina pectoris: Secondary | ICD-10-CM | POA: Insufficient documentation

## 2013-07-28 DIAGNOSIS — I059 Rheumatic mitral valve disease, unspecified: Secondary | ICD-10-CM | POA: Diagnosis not present

## 2013-07-28 DIAGNOSIS — I10 Essential (primary) hypertension: Secondary | ICD-10-CM | POA: Insufficient documentation

## 2013-07-28 DIAGNOSIS — E119 Type 2 diabetes mellitus without complications: Secondary | ICD-10-CM | POA: Diagnosis not present

## 2013-07-28 NOTE — Progress Notes (Signed)
Echo performed. 

## 2013-08-03 ENCOUNTER — Encounter (HOSPITAL_COMMUNITY)
Admission: RE | Admit: 2013-08-03 | Discharge: 2013-08-03 | Disposition: A | Discharge status: Home / Self Care | Payer: Self-pay | Source: Ambulatory Visit | Attending: Internal Medicine | Admitting: Internal Medicine

## 2013-08-03 DIAGNOSIS — Z9861 Coronary angioplasty status: Secondary | ICD-10-CM | POA: Insufficient documentation

## 2013-08-03 DIAGNOSIS — E785 Hyperlipidemia, unspecified: Secondary | ICD-10-CM | POA: Insufficient documentation

## 2013-08-03 DIAGNOSIS — I209 Angina pectoris, unspecified: Secondary | ICD-10-CM | POA: Insufficient documentation

## 2013-08-03 DIAGNOSIS — I2510 Atherosclerotic heart disease of native coronary artery without angina pectoris: Secondary | ICD-10-CM | POA: Insufficient documentation

## 2013-08-03 DIAGNOSIS — Z7982 Long term (current) use of aspirin: Secondary | ICD-10-CM | POA: Insufficient documentation

## 2013-08-03 DIAGNOSIS — E8881 Metabolic syndrome: Secondary | ICD-10-CM | POA: Insufficient documentation

## 2013-08-03 DIAGNOSIS — Z5189 Encounter for other specified aftercare: Secondary | ICD-10-CM | POA: Insufficient documentation

## 2013-08-03 DIAGNOSIS — E663 Overweight: Secondary | ICD-10-CM | POA: Insufficient documentation

## 2013-08-03 DIAGNOSIS — I10 Essential (primary) hypertension: Secondary | ICD-10-CM | POA: Insufficient documentation

## 2013-08-03 DIAGNOSIS — R0602 Shortness of breath: Secondary | ICD-10-CM | POA: Insufficient documentation

## 2013-08-05 ENCOUNTER — Encounter (HOSPITAL_COMMUNITY)
Admission: RE | Admit: 2013-08-05 | Discharge: 2013-08-05 | Disposition: A | Discharge status: Home / Self Care | Payer: Self-pay | Source: Ambulatory Visit | Attending: Internal Medicine | Admitting: Internal Medicine

## 2013-08-06 ENCOUNTER — Encounter (HOSPITAL_COMMUNITY)
Admission: RE | Admit: 2013-08-06 | Discharge: 2013-08-06 | Disposition: A | Discharge status: Home / Self Care | Payer: Self-pay | Source: Ambulatory Visit | Attending: Cardiology | Admitting: Cardiology

## 2013-08-10 ENCOUNTER — Encounter (HOSPITAL_COMMUNITY)
Admission: RE | Admit: 2013-08-10 | Discharge: 2013-08-10 | Disposition: A | Discharge status: Home / Self Care | Payer: Self-pay | Source: Ambulatory Visit | Attending: Internal Medicine | Admitting: Internal Medicine

## 2013-08-12 ENCOUNTER — Encounter (HOSPITAL_COMMUNITY)
Admission: RE | Admit: 2013-08-12 | Discharge: 2013-08-12 | Disposition: A | Discharge status: Home / Self Care | Payer: Self-pay | Source: Ambulatory Visit | Attending: Internal Medicine | Admitting: Internal Medicine

## 2013-08-13 ENCOUNTER — Encounter (HOSPITAL_COMMUNITY)
Admission: RE | Admit: 2013-08-13 | Discharge: 2013-08-13 | Disposition: A | Discharge status: Home / Self Care | Payer: Self-pay | Source: Ambulatory Visit | Attending: Cardiology | Admitting: Cardiology

## 2013-08-17 ENCOUNTER — Encounter (HOSPITAL_COMMUNITY): Payer: Self-pay

## 2013-08-18 ENCOUNTER — Ambulatory Visit (INDEPENDENT_AMBULATORY_CARE_PROVIDER_SITE_OTHER): Payer: Medicare Other

## 2013-08-18 DIAGNOSIS — Z23 Encounter for immunization: Secondary | ICD-10-CM

## 2013-08-19 ENCOUNTER — Encounter (HOSPITAL_COMMUNITY)
Admission: RE | Admit: 2013-08-19 | Discharge: 2013-08-19 | Disposition: A | Discharge status: Home / Self Care | Payer: Self-pay | Source: Ambulatory Visit | Attending: Internal Medicine | Admitting: Internal Medicine

## 2013-08-19 ENCOUNTER — Other Ambulatory Visit: Payer: Self-pay | Admitting: Cardiology

## 2013-08-20 ENCOUNTER — Encounter (HOSPITAL_COMMUNITY)
Admission: RE | Admit: 2013-08-20 | Discharge: 2013-08-20 | Disposition: A | Discharge status: Home / Self Care | Payer: Self-pay | Source: Ambulatory Visit | Attending: Cardiology | Admitting: Cardiology

## 2013-08-23 ENCOUNTER — Encounter (HOSPITAL_COMMUNITY)
Admission: RE | Admit: 2013-08-23 | Discharge: 2013-08-23 | Disposition: A | Discharge status: Home / Self Care | Payer: Self-pay | Source: Ambulatory Visit | Attending: Cardiology | Admitting: Cardiology

## 2013-08-24 ENCOUNTER — Encounter (HOSPITAL_COMMUNITY)
Admission: RE | Admit: 2013-08-24 | Discharge: 2013-08-24 | Disposition: A | Discharge status: Home / Self Care | Payer: Self-pay | Source: Ambulatory Visit | Attending: Internal Medicine | Admitting: Internal Medicine

## 2013-08-27 ENCOUNTER — Encounter (HOSPITAL_COMMUNITY): Payer: Self-pay

## 2013-08-31 ENCOUNTER — Encounter (HOSPITAL_COMMUNITY)
Admission: RE | Admit: 2013-08-31 | Discharge: 2013-08-31 | Disposition: A | Discharge status: Home / Self Care | Payer: Self-pay | Source: Ambulatory Visit | Attending: Cardiology | Admitting: Cardiology

## 2013-09-01 ENCOUNTER — Encounter (HOSPITAL_COMMUNITY)
Admission: RE | Admit: 2013-09-01 | Discharge: 2013-09-01 | Disposition: A | Discharge status: Home / Self Care | Payer: Self-pay | Source: Ambulatory Visit | Attending: Internal Medicine | Admitting: Internal Medicine

## 2013-09-03 ENCOUNTER — Encounter (HOSPITAL_COMMUNITY): Payer: Medicare Other

## 2013-09-03 DIAGNOSIS — E663 Overweight: Secondary | ICD-10-CM | POA: Insufficient documentation

## 2013-09-03 DIAGNOSIS — I2510 Atherosclerotic heart disease of native coronary artery without angina pectoris: Secondary | ICD-10-CM | POA: Insufficient documentation

## 2013-09-03 DIAGNOSIS — I10 Essential (primary) hypertension: Secondary | ICD-10-CM | POA: Insufficient documentation

## 2013-09-03 DIAGNOSIS — E8881 Metabolic syndrome: Secondary | ICD-10-CM | POA: Insufficient documentation

## 2013-09-03 DIAGNOSIS — E785 Hyperlipidemia, unspecified: Secondary | ICD-10-CM | POA: Insufficient documentation

## 2013-09-03 DIAGNOSIS — Z9861 Coronary angioplasty status: Secondary | ICD-10-CM | POA: Insufficient documentation

## 2013-09-03 DIAGNOSIS — R0602 Shortness of breath: Secondary | ICD-10-CM | POA: Insufficient documentation

## 2013-09-03 DIAGNOSIS — Z7982 Long term (current) use of aspirin: Secondary | ICD-10-CM | POA: Insufficient documentation

## 2013-09-03 DIAGNOSIS — Z5189 Encounter for other specified aftercare: Secondary | ICD-10-CM | POA: Insufficient documentation

## 2013-09-03 DIAGNOSIS — I209 Angina pectoris, unspecified: Secondary | ICD-10-CM | POA: Insufficient documentation

## 2013-09-07 ENCOUNTER — Encounter (HOSPITAL_COMMUNITY)
Admission: RE | Admit: 2013-09-07 | Discharge: 2013-09-07 | Disposition: A | Discharge status: Home / Self Care | Payer: Self-pay | Source: Ambulatory Visit | Attending: Internal Medicine | Admitting: Internal Medicine

## 2013-09-09 ENCOUNTER — Encounter (HOSPITAL_COMMUNITY)
Admission: RE | Admit: 2013-09-09 | Discharge: 2013-09-09 | Disposition: A | Discharge status: Home / Self Care | Payer: Self-pay | Source: Ambulatory Visit | Attending: Internal Medicine | Admitting: Internal Medicine

## 2013-09-10 ENCOUNTER — Encounter (HOSPITAL_COMMUNITY)
Admission: RE | Admit: 2013-09-10 | Discharge: 2013-09-10 | Disposition: A | Discharge status: Home / Self Care | Payer: Self-pay | Source: Ambulatory Visit | Attending: Cardiology | Admitting: Cardiology

## 2013-09-14 ENCOUNTER — Encounter (HOSPITAL_COMMUNITY)
Admission: RE | Admit: 2013-09-14 | Discharge: 2013-09-14 | Disposition: A | Discharge status: Home / Self Care | Payer: Self-pay | Source: Ambulatory Visit | Attending: Internal Medicine | Admitting: Internal Medicine

## 2013-09-16 ENCOUNTER — Encounter (HOSPITAL_COMMUNITY)
Admission: RE | Admit: 2013-09-16 | Discharge: 2013-09-16 | Disposition: A | Discharge status: Home / Self Care | Payer: Self-pay | Source: Ambulatory Visit | Attending: Internal Medicine | Admitting: Internal Medicine

## 2013-09-17 ENCOUNTER — Encounter (HOSPITAL_COMMUNITY)
Admission: RE | Admit: 2013-09-17 | Discharge: 2013-09-17 | Disposition: A | Discharge status: Home / Self Care | Payer: Self-pay | Source: Ambulatory Visit | Attending: Cardiology | Admitting: Cardiology

## 2013-09-21 ENCOUNTER — Encounter (HOSPITAL_COMMUNITY): Payer: Medicare Other

## 2013-09-23 ENCOUNTER — Encounter (HOSPITAL_COMMUNITY): Payer: Medicare Other

## 2013-09-24 ENCOUNTER — Encounter (HOSPITAL_COMMUNITY)
Admission: RE | Admit: 2013-09-24 | Discharge: 2013-09-24 | Disposition: A | Discharge status: Home / Self Care | Payer: Self-pay | Source: Ambulatory Visit | Attending: Internal Medicine | Admitting: Internal Medicine

## 2013-09-27 DIAGNOSIS — Z961 Presence of intraocular lens: Secondary | ICD-10-CM | POA: Diagnosis not present

## 2013-09-28 ENCOUNTER — Encounter (HOSPITAL_COMMUNITY)
Admission: RE | Admit: 2013-09-28 | Discharge: 2013-09-28 | Disposition: A | Discharge status: Home / Self Care | Payer: Self-pay | Source: Ambulatory Visit | Attending: Internal Medicine | Admitting: Internal Medicine

## 2013-09-30 ENCOUNTER — Encounter (HOSPITAL_COMMUNITY)
Admission: RE | Admit: 2013-09-30 | Discharge: 2013-09-30 | Disposition: A | Discharge status: Home / Self Care | Payer: Self-pay | Source: Ambulatory Visit | Attending: Internal Medicine | Admitting: Internal Medicine

## 2013-10-01 ENCOUNTER — Encounter (HOSPITAL_COMMUNITY)
Admission: RE | Admit: 2013-10-01 | Discharge: 2013-10-01 | Disposition: A | Discharge status: Home / Self Care | Payer: Self-pay | Source: Ambulatory Visit | Attending: Cardiology | Admitting: Cardiology

## 2013-10-05 ENCOUNTER — Encounter (HOSPITAL_COMMUNITY)
Admission: RE | Admit: 2013-10-05 | Discharge: 2013-10-05 | Disposition: A | Discharge status: Home / Self Care | Payer: Self-pay | Source: Ambulatory Visit | Attending: Cardiology | Admitting: Cardiology

## 2013-10-05 DIAGNOSIS — Z9861 Coronary angioplasty status: Secondary | ICD-10-CM | POA: Insufficient documentation

## 2013-10-05 DIAGNOSIS — E785 Hyperlipidemia, unspecified: Secondary | ICD-10-CM | POA: Insufficient documentation

## 2013-10-05 DIAGNOSIS — Z5189 Encounter for other specified aftercare: Secondary | ICD-10-CM | POA: Insufficient documentation

## 2013-10-05 DIAGNOSIS — Z7982 Long term (current) use of aspirin: Secondary | ICD-10-CM | POA: Insufficient documentation

## 2013-10-05 DIAGNOSIS — I209 Angina pectoris, unspecified: Secondary | ICD-10-CM | POA: Insufficient documentation

## 2013-10-05 DIAGNOSIS — E8881 Metabolic syndrome: Secondary | ICD-10-CM | POA: Insufficient documentation

## 2013-10-05 DIAGNOSIS — I10 Essential (primary) hypertension: Secondary | ICD-10-CM | POA: Insufficient documentation

## 2013-10-05 DIAGNOSIS — R0602 Shortness of breath: Secondary | ICD-10-CM | POA: Insufficient documentation

## 2013-10-05 DIAGNOSIS — E663 Overweight: Secondary | ICD-10-CM | POA: Insufficient documentation

## 2013-10-05 DIAGNOSIS — I2510 Atherosclerotic heart disease of native coronary artery without angina pectoris: Secondary | ICD-10-CM | POA: Insufficient documentation

## 2013-10-07 ENCOUNTER — Encounter (HOSPITAL_COMMUNITY)
Admission: RE | Admit: 2013-10-07 | Discharge: 2013-10-07 | Disposition: A | Discharge status: Home / Self Care | Payer: Self-pay | Source: Ambulatory Visit | Attending: Cardiology | Admitting: Cardiology

## 2013-10-08 ENCOUNTER — Encounter (HOSPITAL_COMMUNITY)
Admission: RE | Admit: 2013-10-08 | Discharge: 2013-10-08 | Disposition: A | Discharge status: Home / Self Care | Payer: Self-pay | Source: Ambulatory Visit | Attending: Cardiology | Admitting: Cardiology

## 2013-10-12 ENCOUNTER — Encounter (HOSPITAL_COMMUNITY)
Admission: RE | Admit: 2013-10-12 | Discharge: 2013-10-12 | Disposition: A | Discharge status: Home / Self Care | Payer: Self-pay | Source: Ambulatory Visit | Attending: Cardiology | Admitting: Cardiology

## 2013-10-14 ENCOUNTER — Encounter (HOSPITAL_COMMUNITY)
Admission: RE | Admit: 2013-10-14 | Discharge: 2013-10-14 | Disposition: A | Discharge status: Home / Self Care | Payer: Self-pay | Source: Ambulatory Visit | Attending: Cardiology | Admitting: Cardiology

## 2013-10-15 ENCOUNTER — Encounter (HOSPITAL_COMMUNITY)
Admission: RE | Admit: 2013-10-15 | Discharge: 2013-10-15 | Disposition: A | Discharge status: Home / Self Care | Payer: Self-pay | Source: Ambulatory Visit | Attending: Cardiology | Admitting: Cardiology

## 2013-10-19 ENCOUNTER — Encounter (HOSPITAL_COMMUNITY): Payer: Self-pay

## 2013-10-21 ENCOUNTER — Encounter (HOSPITAL_COMMUNITY): Payer: Self-pay

## 2013-10-22 ENCOUNTER — Encounter (HOSPITAL_COMMUNITY): Payer: Self-pay

## 2013-10-26 ENCOUNTER — Encounter (HOSPITAL_COMMUNITY): Payer: Self-pay

## 2013-10-28 ENCOUNTER — Encounter (HOSPITAL_COMMUNITY): Payer: Self-pay

## 2013-10-29 ENCOUNTER — Encounter (HOSPITAL_COMMUNITY): Payer: Self-pay

## 2013-11-02 ENCOUNTER — Encounter (HOSPITAL_COMMUNITY)
Admission: RE | Admit: 2013-11-02 | Discharge: 2013-11-02 | Disposition: A | Discharge status: Home / Self Care | Payer: Self-pay | Source: Ambulatory Visit | Attending: Cardiology | Admitting: Cardiology

## 2013-11-02 DIAGNOSIS — R0602 Shortness of breath: Secondary | ICD-10-CM | POA: Insufficient documentation

## 2013-11-02 DIAGNOSIS — I209 Angina pectoris, unspecified: Secondary | ICD-10-CM | POA: Insufficient documentation

## 2013-11-02 DIAGNOSIS — Z9861 Coronary angioplasty status: Secondary | ICD-10-CM | POA: Insufficient documentation

## 2013-11-02 DIAGNOSIS — E8881 Metabolic syndrome: Secondary | ICD-10-CM | POA: Insufficient documentation

## 2013-11-02 DIAGNOSIS — Z7982 Long term (current) use of aspirin: Secondary | ICD-10-CM | POA: Insufficient documentation

## 2013-11-02 DIAGNOSIS — I10 Essential (primary) hypertension: Secondary | ICD-10-CM | POA: Insufficient documentation

## 2013-11-02 DIAGNOSIS — I2510 Atherosclerotic heart disease of native coronary artery without angina pectoris: Secondary | ICD-10-CM | POA: Insufficient documentation

## 2013-11-02 DIAGNOSIS — Z5189 Encounter for other specified aftercare: Secondary | ICD-10-CM | POA: Insufficient documentation

## 2013-11-02 DIAGNOSIS — E785 Hyperlipidemia, unspecified: Secondary | ICD-10-CM | POA: Insufficient documentation

## 2013-11-02 DIAGNOSIS — E663 Overweight: Secondary | ICD-10-CM | POA: Insufficient documentation

## 2013-11-04 ENCOUNTER — Encounter (HOSPITAL_COMMUNITY): Payer: Self-pay

## 2013-11-05 ENCOUNTER — Encounter (HOSPITAL_COMMUNITY)
Admission: RE | Admit: 2013-11-05 | Discharge: 2013-11-05 | Disposition: A | Discharge status: Home / Self Care | Payer: Self-pay | Source: Ambulatory Visit | Attending: Cardiology | Admitting: Cardiology

## 2013-11-09 ENCOUNTER — Encounter (HOSPITAL_COMMUNITY)
Admission: RE | Admit: 2013-11-09 | Discharge: 2013-11-09 | Disposition: A | Discharge status: Home / Self Care | Payer: Self-pay | Source: Ambulatory Visit | Attending: Cardiology | Admitting: Cardiology

## 2013-11-11 ENCOUNTER — Encounter (HOSPITAL_COMMUNITY)
Admission: RE | Admit: 2013-11-11 | Discharge: 2013-11-11 | Disposition: A | Discharge status: Home / Self Care | Payer: Self-pay | Source: Ambulatory Visit | Attending: Cardiology | Admitting: Cardiology

## 2013-11-12 ENCOUNTER — Encounter (HOSPITAL_COMMUNITY)
Admission: RE | Admit: 2013-11-12 | Discharge: 2013-11-12 | Disposition: A | Discharge status: Home / Self Care | Payer: Self-pay | Source: Ambulatory Visit | Attending: Cardiology | Admitting: Cardiology

## 2013-11-15 ENCOUNTER — Other Ambulatory Visit (INDEPENDENT_AMBULATORY_CARE_PROVIDER_SITE_OTHER): Payer: Medicare Other

## 2013-11-15 ENCOUNTER — Other Ambulatory Visit: Payer: Self-pay | Admitting: *Deleted

## 2013-11-15 DIAGNOSIS — I10 Essential (primary) hypertension: Secondary | ICD-10-CM | POA: Diagnosis not present

## 2013-11-15 DIAGNOSIS — I4891 Unspecified atrial fibrillation: Secondary | ICD-10-CM

## 2013-11-15 DIAGNOSIS — I2510 Atherosclerotic heart disease of native coronary artery without angina pectoris: Secondary | ICD-10-CM | POA: Diagnosis not present

## 2013-11-15 DIAGNOSIS — E785 Hyperlipidemia, unspecified: Secondary | ICD-10-CM

## 2013-11-15 DIAGNOSIS — E119 Type 2 diabetes mellitus without complications: Secondary | ICD-10-CM

## 2013-11-15 LAB — BASIC METABOLIC PANEL
BUN: 13 mg/dL (ref 6–23)
CO2: 28 mEq/L (ref 19–32)
Calcium: 9.1 mg/dL (ref 8.4–10.5)
Chloride: 108 mEq/L (ref 96–112)
Creatinine, Ser: 0.9 mg/dL (ref 0.4–1.5)
GFR: 87.95 mL/min (ref >60.00)
Glucose, Bld: 125 mg/dL — ABNORMAL HIGH (ref 70–99)
Potassium: 4.1 mEq/L (ref 3.5–5.1)
Sodium: 143 mEq/L (ref 135–145)

## 2013-11-15 LAB — CBC
HCT: 43.4 % (ref 39.0–52.0)
Hemoglobin: 14.4 g/dL (ref 13.0–17.0)
MCHC: 33.1 g/dL (ref 30.0–36.0)
MCV: 89.3 fl (ref 78.0–100.0)
Platelets: 215 10*3/uL (ref 150.0–400.0)
RBC: 4.87 Mil/uL (ref 4.22–5.81)
RDW: 13.9 % (ref 11.5–14.6)
WBC: 8.6 10*3/uL (ref 4.5–10.5)

## 2013-11-15 LAB — LIPID PANEL
Cholesterol: 111 mg/dL (ref 0–200)
HDL: 45.1 mg/dL (ref >39.00)
LDL Cholesterol: 39 mg/dL (ref 0–99)
Total CHOL/HDL Ratio: 2
Triglycerides: 137 mg/dL (ref 0.0–149.0)
VLDL: 27.4 mg/dL (ref 0.0–40.0)

## 2013-11-15 LAB — HEPATIC FUNCTION PANEL
ALT: 29 U/L (ref 0–53)
AST: 24 U/L (ref 0–37)
Albumin: 4.3 g/dL (ref 3.5–5.2)
Alkaline Phosphatase: 70 U/L (ref 39–117)
Bilirubin, Direct: 0.1 mg/dL (ref 0.0–0.3)
Total Bilirubin: 1 mg/dL (ref 0.3–1.2)
Total Protein: 7.1 g/dL (ref 6.0–8.3)

## 2013-11-15 LAB — HEMOGLOBIN A1C: Hgb A1c MFr Bld: 6.4 % (ref 4.6–6.5)

## 2013-11-15 NOTE — Progress Notes (Unsigned)
Pt arrived for lab draw/ no orders in system, repeated labs that were drawn prior.

## 2013-11-16 ENCOUNTER — Encounter (HOSPITAL_COMMUNITY)
Admission: RE | Admit: 2013-11-16 | Discharge: 2013-11-16 | Disposition: A | Discharge status: Home / Self Care | Payer: Self-pay | Source: Ambulatory Visit | Attending: Cardiology | Admitting: Cardiology

## 2013-11-18 ENCOUNTER — Encounter: Payer: Self-pay | Admitting: Cardiology

## 2013-11-18 ENCOUNTER — Other Ambulatory Visit: Payer: Self-pay

## 2013-11-18 ENCOUNTER — Encounter (HOSPITAL_COMMUNITY)
Admission: RE | Admit: 2013-11-18 | Discharge: 2013-11-18 | Disposition: A | Discharge status: Home / Self Care | Payer: Self-pay | Source: Ambulatory Visit | Attending: Cardiology | Admitting: Cardiology

## 2013-11-18 ENCOUNTER — Ambulatory Visit (INDEPENDENT_AMBULATORY_CARE_PROVIDER_SITE_OTHER): Payer: Medicare Other | Admitting: Cardiology

## 2013-11-18 VITALS — BP 142/88 | HR 64 | Ht 68.0 in | Wt 185.0 lb

## 2013-11-18 DIAGNOSIS — I4891 Unspecified atrial fibrillation: Secondary | ICD-10-CM

## 2013-11-18 DIAGNOSIS — E119 Type 2 diabetes mellitus without complications: Secondary | ICD-10-CM | POA: Diagnosis not present

## 2013-11-18 DIAGNOSIS — E785 Hyperlipidemia, unspecified: Secondary | ICD-10-CM

## 2013-11-18 DIAGNOSIS — I10 Essential (primary) hypertension: Secondary | ICD-10-CM

## 2013-11-18 DIAGNOSIS — I2510 Atherosclerotic heart disease of native coronary artery without angina pectoris: Secondary | ICD-10-CM | POA: Diagnosis not present

## 2013-11-18 NOTE — Patient Instructions (Signed)
Continue your current therapy  I will see you in 6 months.   

## 2013-11-18 NOTE — Progress Notes (Signed)
[name] Date of Birth: [birth year] Medical Record #[id number]  History of Present Illness: Jacob Curry is seen as a work in today for evaluation of new atrial fibrillation.. He has a known history of coronary disease with cardiac catheterization in May of 2011 showing total occlusion of a large left circumflex vessel with good left to left collaterals. Ejection fraction was 50-55%. He has been managed medically. He has a history of hypertension, hyperlipidemia, and diabetes. He has a history of moderate obstructive sleep apnea. This is managed with an oral appliance. He was unable to tolerate CPAP therapy. In October 2014 he was noted to be in atrial fibrillation. He converted to NSR. He is anticoagulated with Eliquis. He denies any palpitations, SOB, or chest pain. Vial signs at cardiac Rehab have been normal.  Current Outpatient Prescriptions on File Prior to Visit  Medication Sig Dispense Refill  . apixaban (ELIQUIS) 5 MG TABS tablet Take 1 tablet (5 mg total) by mouth 2 (two) times daily.  60 tablet  11  . carvedilol (COREG) 12.5 MG tablet TAKE 1 AND 1/2 TABLETS TWICE DAILY WITH MEALS  270 tablet  2  . fish oil-omega-3 fatty acids 1000 MG capsule Take 2 g by mouth daily.        Marland Kitchen losartan (COZAAR) 100 MG tablet Take 1 tablet (100 mg total) by mouth daily.  90 tablet  3  . metFORMIN (GLUCOPHAGE) 500 MG tablet Take one tablet by mouth daily  with meals      . Multiple Vitamin (MULTIVITAMIN) tablet Take 1 tablet by mouth daily.      . nitroGLYCERIN (NITROSTAT) 0.4 MG SL tablet Place 1 tablet (0.4 mg total) under the tongue every 5 (five) minutes as needed.  25 tablet  prn  . Saw Palmetto, Serenoa repens, (SAW PALMETTO PO) Take 1 tablet by mouth daily.        . simvastatin (ZOCOR) 40 MG tablet TAKE 1 TABLET AT BEDTIME  90 tablet  3   No current facility-administered medications on file prior to visit.    Allergies  Allergen Reactions  . Acetaminophen     Drug induced hepatitis  .  Oxycodone-Acetaminophen     Drug induced hepatitis    Past Medical History  Diagnosis Date  . CAD (coronary artery disease)     Dr Krisi Azua Martinique  . HTN (hypertension)   . HLD (hyperlipidemia)   . DM (diabetes mellitus)   . ED (erectile dysfunction)   . Carotid stenosis   . Obesity   . OSA (obstructive sleep apnea)     oral appliance, Dr Ron Parker  . Paroxysmal atrial fibrillation     Past Surgical History  Procedure Laterality Date  . Appendectomy  1958  . Ankle fracture surgery  1993  . Hand surgery  1965  . Tonsillectomy    . Cardiac catheterization  2007 & 2011  . Colonoscopy  2002    negative; Dr Olevia Perches    History  Smoking status  . Former Smoker -- 1.00 packs/day  . Quit date: 09/02/1990  Smokeless tobacco  . Never Used    Comment: onset age 58-50 up to 1 ppd    History  Alcohol Use No    Family History  Problem Relation Age of Onset  . Diabetes Mother   . Colon cancer Paternal Uncle   . Heart attack Father      4 vessel CBAG @ 69  . Diabetes Brother   . Stroke Neg Hx  Review of Systems: As noted in history of present illness.  All other systems were reviewed and are negative.  Physical Exam: BP 142/88  Pulse 64  Ht 5\' 8"  (1.727 m)  Wt 185 lb (83.915 kg)  BMI 28.14 kg/m2 He is a pleasant white male in no acute distress. HEENT exam is unremarkable. He has no jugular venous distention or bruits. There is no adenopathy or thyromegaly. Cardiac exam reveals a regular rate and rhythm with occasional extrasystole. There is no  gallop, murmur, or click. PMI is normal. Lungs are clear. Abdomen is soft and nontender without masses or bruits. He has normal bowel sounds. Extremities are without cyanosis or edema. Pedal pulses are 2+ and symmetric. He is alert and oriented x3. Cranial nerves II through XII are intact. He has no focal findings. LABORATORY DATA: Lab Results  Component Value Date   WBC 8.6 11/15/2013   HGB 14.4 11/15/2013   HCT 43.4 11/15/2013    PLT 215.0 11/15/2013   GLUCOSE 125* 11/15/2013   CHOL 111 11/15/2013   TRIG 137.0 11/15/2013   HDL 45.10 11/15/2013   LDLDIRECT 39.9 07/22/2008   LDLCALC 39 11/15/2013   ALT 29 11/15/2013   AST 24 11/15/2013   NA 143 11/15/2013   K 4.1 11/15/2013   CL 108 11/15/2013   CREATININE 0.9 11/15/2013   BUN 13 11/15/2013   CO2 28 11/15/2013   TSH 2.27 03/09/2012   PSA 1.70 01/04/2009   INR 1.1 ratio* 01/15/2010   HGBA1C 6.4 11/15/2013   MICROALBUR 0.4 03/09/2012   Echo:Study Conclusions  - Left ventricle: The cavity size was normal. Wall thickness was increased in a pattern of mild LVH. There was mild focal basal and mild concentric hypertrophy of the septum. Systolic function was normal. The estimated ejection fraction was in the range of 55% to 60%. Wall motion was normal; there were no regional wall motion abnormalities. Doppler parameters are consistent with abnormal left ventricular relaxation (grade 1 diastolic dysfunction). Doppler parameters are consistent with elevated ventricular end-diastolic filling pressure. - Mitral valve: Calcified annulus. Mildly thickened leaflets . - Left atrium: The atrium was mildly to moderately dilated. - Atrial septum: No defect or patent foramen ovale was identified.    Assessment / Plan: 1. 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 Patient is asymptomatic on medical therapy.   2. Hypertension, controlled per cardiac rehabilitation notes.  3. Hyperlipidemia well controlled on medication.  His lipids are excellent.  4. Atrial fibrillation, paroxysmal. Patient is back in sinus rhythm. His rate was controlled on beta blocker therapy. He was minimally symptomatic. He does have a high Chadvasc score of 4. Continue anticoagulation with Eliquis 5 mg twice a day.   I will follow up in 6 months.

## 2013-11-19 ENCOUNTER — Encounter (HOSPITAL_COMMUNITY)
Admission: RE | Admit: 2013-11-19 | Discharge: 2013-11-19 | Disposition: A | Discharge status: Home / Self Care | Payer: Self-pay | Source: Ambulatory Visit | Attending: Cardiology | Admitting: Cardiology

## 2013-11-23 ENCOUNTER — Encounter (HOSPITAL_COMMUNITY): Payer: Self-pay

## 2013-11-25 ENCOUNTER — Encounter (HOSPITAL_COMMUNITY)
Admission: RE | Admit: 2013-11-25 | Discharge: 2013-11-25 | Disposition: A | Discharge status: Home / Self Care | Payer: Self-pay | Source: Ambulatory Visit | Attending: Cardiology | Admitting: Cardiology

## 2013-11-26 ENCOUNTER — Encounter (HOSPITAL_COMMUNITY)
Admission: RE | Admit: 2013-11-26 | Discharge: 2013-11-26 | Disposition: A | Discharge status: Home / Self Care | Payer: Self-pay | Source: Ambulatory Visit | Attending: Cardiology | Admitting: Cardiology

## 2013-11-30 ENCOUNTER — Encounter (HOSPITAL_COMMUNITY)
Admission: RE | Admit: 2013-11-30 | Discharge: 2013-11-30 | Disposition: A | Discharge status: Home / Self Care | Payer: Self-pay | Source: Ambulatory Visit | Attending: Cardiology | Admitting: Cardiology

## 2013-12-02 ENCOUNTER — Encounter (HOSPITAL_COMMUNITY)
Admission: RE | Admit: 2013-12-02 | Discharge: 2013-12-02 | Disposition: A | Discharge status: Home / Self Care | Payer: Self-pay | Source: Ambulatory Visit | Attending: Cardiology | Admitting: Cardiology

## 2013-12-02 DIAGNOSIS — I2510 Atherosclerotic heart disease of native coronary artery without angina pectoris: Secondary | ICD-10-CM | POA: Insufficient documentation

## 2013-12-02 DIAGNOSIS — R0602 Shortness of breath: Secondary | ICD-10-CM | POA: Insufficient documentation

## 2013-12-02 DIAGNOSIS — E8881 Metabolic syndrome: Secondary | ICD-10-CM | POA: Insufficient documentation

## 2013-12-02 DIAGNOSIS — I10 Essential (primary) hypertension: Secondary | ICD-10-CM | POA: Insufficient documentation

## 2013-12-02 DIAGNOSIS — Z9861 Coronary angioplasty status: Secondary | ICD-10-CM | POA: Insufficient documentation

## 2013-12-02 DIAGNOSIS — E663 Overweight: Secondary | ICD-10-CM | POA: Insufficient documentation

## 2013-12-02 DIAGNOSIS — I209 Angina pectoris, unspecified: Secondary | ICD-10-CM | POA: Insufficient documentation

## 2013-12-02 DIAGNOSIS — E785 Hyperlipidemia, unspecified: Secondary | ICD-10-CM | POA: Insufficient documentation

## 2013-12-02 DIAGNOSIS — Z7982 Long term (current) use of aspirin: Secondary | ICD-10-CM | POA: Insufficient documentation

## 2013-12-02 DIAGNOSIS — Z5189 Encounter for other specified aftercare: Secondary | ICD-10-CM | POA: Insufficient documentation

## 2013-12-03 ENCOUNTER — Encounter (HOSPITAL_COMMUNITY): Payer: Self-pay

## 2013-12-07 ENCOUNTER — Encounter (HOSPITAL_COMMUNITY)
Admission: RE | Admit: 2013-12-07 | Discharge: 2013-12-07 | Disposition: A | Discharge status: Home / Self Care | Payer: Self-pay | Source: Ambulatory Visit | Attending: Cardiology | Admitting: Cardiology

## 2013-12-09 ENCOUNTER — Encounter (HOSPITAL_COMMUNITY)
Admission: RE | Admit: 2013-12-09 | Discharge: 2013-12-09 | Disposition: A | Discharge status: Home / Self Care | Payer: Self-pay | Source: Ambulatory Visit | Attending: Cardiology | Admitting: Cardiology

## 2013-12-10 ENCOUNTER — Encounter (HOSPITAL_COMMUNITY)
Admission: RE | Admit: 2013-12-10 | Discharge: 2013-12-10 | Disposition: A | Discharge status: Home / Self Care | Payer: Self-pay | Source: Ambulatory Visit | Attending: Cardiology | Admitting: Cardiology

## 2013-12-14 ENCOUNTER — Encounter (HOSPITAL_COMMUNITY)
Admission: RE | Admit: 2013-12-14 | Discharge: 2013-12-14 | Disposition: A | Discharge status: Home / Self Care | Payer: Self-pay | Source: Ambulatory Visit | Attending: Cardiology | Admitting: Cardiology

## 2013-12-16 ENCOUNTER — Encounter (HOSPITAL_COMMUNITY)
Admission: RE | Admit: 2013-12-16 | Discharge: 2013-12-16 | Disposition: A | Discharge status: Home / Self Care | Payer: Self-pay | Source: Ambulatory Visit | Attending: Cardiology | Admitting: Cardiology

## 2013-12-17 ENCOUNTER — Encounter (HOSPITAL_COMMUNITY)
Admission: RE | Admit: 2013-12-17 | Discharge: 2013-12-17 | Disposition: A | Discharge status: Home / Self Care | Payer: Self-pay | Source: Ambulatory Visit | Attending: Cardiology | Admitting: Cardiology

## 2013-12-21 ENCOUNTER — Encounter (HOSPITAL_COMMUNITY): Payer: Self-pay

## 2013-12-23 ENCOUNTER — Encounter (HOSPITAL_COMMUNITY)
Admission: RE | Admit: 2013-12-23 | Discharge: 2013-12-23 | Disposition: A | Discharge status: Home / Self Care | Payer: Self-pay | Source: Ambulatory Visit | Attending: Cardiology | Admitting: Cardiology

## 2013-12-24 ENCOUNTER — Encounter (HOSPITAL_COMMUNITY)
Admission: RE | Admit: 2013-12-24 | Discharge: 2013-12-24 | Disposition: A | Discharge status: Home / Self Care | Payer: Self-pay | Source: Ambulatory Visit | Attending: Cardiology | Admitting: Cardiology

## 2013-12-28 ENCOUNTER — Encounter (HOSPITAL_COMMUNITY): Payer: Self-pay

## 2013-12-30 ENCOUNTER — Encounter (HOSPITAL_COMMUNITY)
Admission: RE | Admit: 2013-12-30 | Discharge: 2013-12-30 | Disposition: A | Discharge status: Home / Self Care | Payer: Self-pay | Source: Ambulatory Visit | Attending: Cardiology | Admitting: Cardiology

## 2013-12-31 ENCOUNTER — Encounter (HOSPITAL_COMMUNITY)
Admission: RE | Admit: 2013-12-31 | Discharge: 2013-12-31 | Disposition: A | Discharge status: Home / Self Care | Payer: Self-pay | Source: Ambulatory Visit | Attending: Cardiology | Admitting: Cardiology

## 2013-12-31 DIAGNOSIS — Z5189 Encounter for other specified aftercare: Secondary | ICD-10-CM | POA: Insufficient documentation

## 2013-12-31 DIAGNOSIS — Z7982 Long term (current) use of aspirin: Secondary | ICD-10-CM | POA: Insufficient documentation

## 2013-12-31 DIAGNOSIS — I2510 Atherosclerotic heart disease of native coronary artery without angina pectoris: Secondary | ICD-10-CM | POA: Insufficient documentation

## 2013-12-31 DIAGNOSIS — E8881 Metabolic syndrome: Secondary | ICD-10-CM | POA: Insufficient documentation

## 2013-12-31 DIAGNOSIS — Z9861 Coronary angioplasty status: Secondary | ICD-10-CM | POA: Insufficient documentation

## 2013-12-31 DIAGNOSIS — R0602 Shortness of breath: Secondary | ICD-10-CM | POA: Insufficient documentation

## 2013-12-31 DIAGNOSIS — I10 Essential (primary) hypertension: Secondary | ICD-10-CM | POA: Insufficient documentation

## 2013-12-31 DIAGNOSIS — I209 Angina pectoris, unspecified: Secondary | ICD-10-CM | POA: Insufficient documentation

## 2013-12-31 DIAGNOSIS — E785 Hyperlipidemia, unspecified: Secondary | ICD-10-CM | POA: Insufficient documentation

## 2013-12-31 DIAGNOSIS — E663 Overweight: Secondary | ICD-10-CM | POA: Insufficient documentation

## 2014-01-04 ENCOUNTER — Encounter (HOSPITAL_COMMUNITY)
Admission: RE | Admit: 2014-01-04 | Discharge: 2014-01-04 | Disposition: A | Discharge status: Home / Self Care | Payer: Self-pay | Source: Ambulatory Visit | Attending: Cardiology | Admitting: Cardiology

## 2014-01-06 ENCOUNTER — Encounter (HOSPITAL_COMMUNITY)
Admission: RE | Admit: 2014-01-06 | Discharge: 2014-01-06 | Disposition: A | Discharge status: Home / Self Care | Payer: Self-pay | Source: Ambulatory Visit | Attending: Cardiology | Admitting: Cardiology

## 2014-01-07 ENCOUNTER — Encounter (HOSPITAL_COMMUNITY)
Admission: RE | Admit: 2014-01-07 | Discharge: 2014-01-07 | Disposition: A | Discharge status: Home / Self Care | Payer: Self-pay | Source: Ambulatory Visit | Attending: Cardiology | Admitting: Cardiology

## 2014-01-11 ENCOUNTER — Encounter (HOSPITAL_COMMUNITY)
Admission: RE | Admit: 2014-01-11 | Discharge: 2014-01-11 | Disposition: A | Discharge status: Home / Self Care | Payer: Self-pay | Source: Ambulatory Visit | Attending: Cardiology | Admitting: Cardiology

## 2014-01-11 IMAGING — CR DG CHEST 2V
2 series · 2 of 2 positions shown · non-contrast
Comparison: 10/18/2012.

CLINICAL DATA: Cough that started 06/18/2013

EXAM:
CHEST  2 VIEW

[w chest pa]
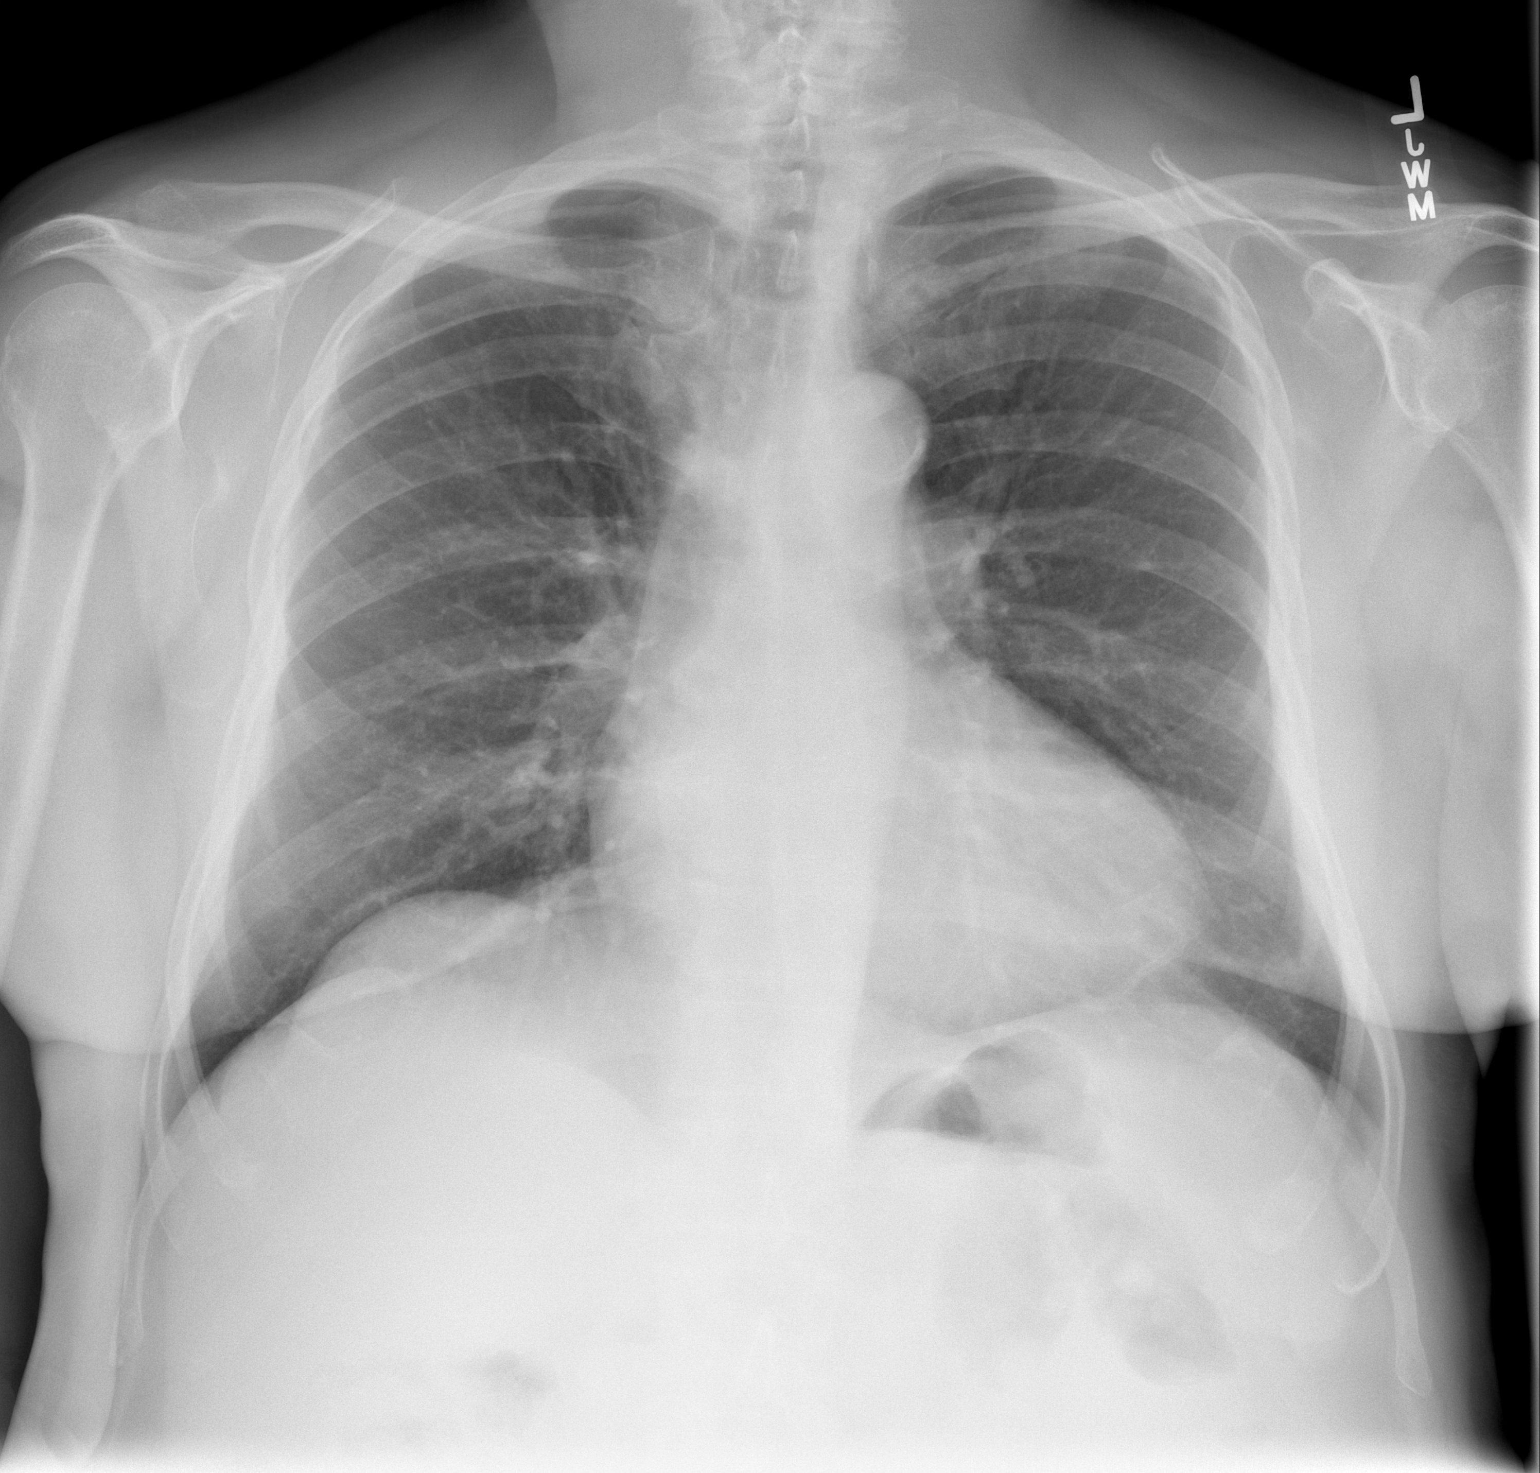

[w chest lat]
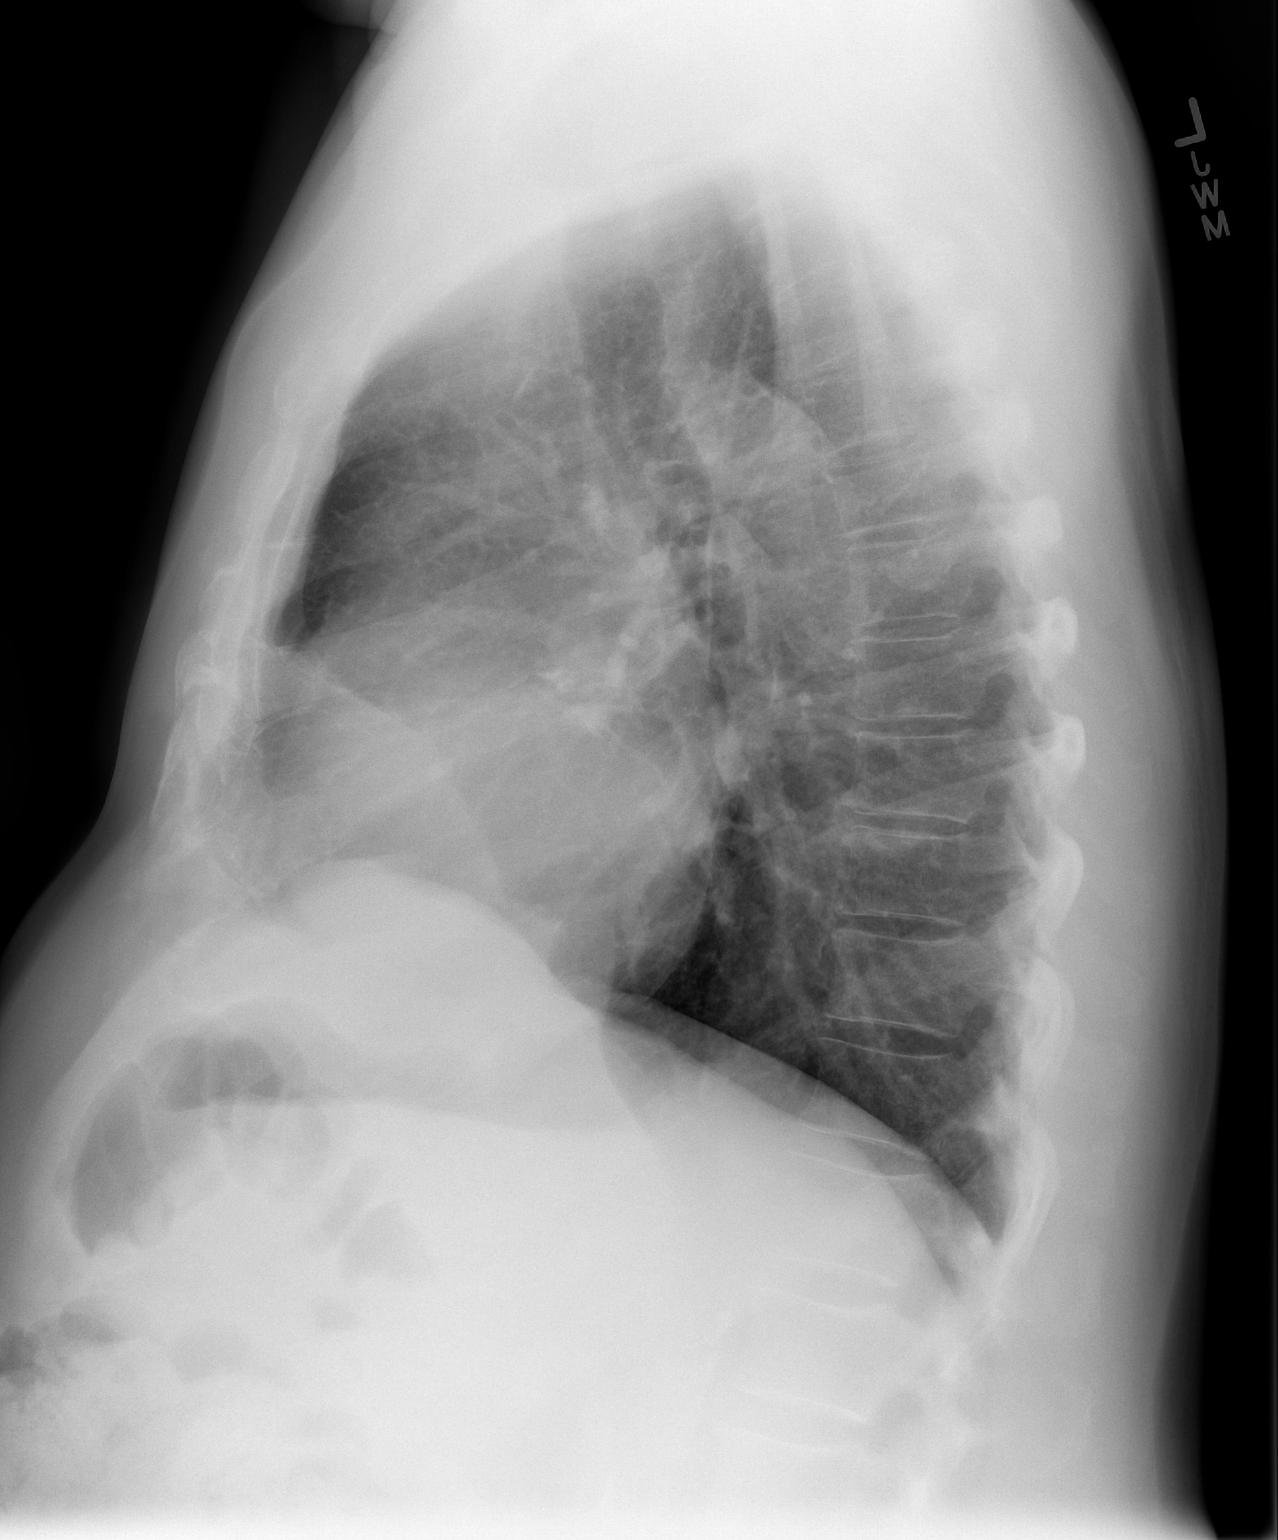

[2 of 2 positions shown; findings below may reference images not displayed]

FINDINGS: The heart size and mediastinal contours are within normal limits.
Both lungs are clear. The visualized skeletal structures are
unremarkable.
IMPRESSION: No active cardiopulmonary disease.

## 2014-01-13 ENCOUNTER — Encounter (HOSPITAL_COMMUNITY)
Admission: RE | Admit: 2014-01-13 | Discharge: 2014-01-13 | Disposition: A | Discharge status: Home / Self Care | Payer: Self-pay | Source: Ambulatory Visit | Attending: Cardiology | Admitting: Cardiology

## 2014-01-14 ENCOUNTER — Encounter (HOSPITAL_COMMUNITY): Payer: Self-pay

## 2014-01-18 ENCOUNTER — Encounter (HOSPITAL_COMMUNITY): Payer: Self-pay

## 2014-01-20 ENCOUNTER — Encounter (HOSPITAL_COMMUNITY): Payer: Self-pay

## 2014-01-21 ENCOUNTER — Encounter (HOSPITAL_COMMUNITY)
Admission: RE | Admit: 2014-01-21 | Discharge: 2014-01-21 | Disposition: A | Discharge status: Home / Self Care | Payer: Self-pay | Source: Ambulatory Visit | Attending: Cardiology | Admitting: Cardiology

## 2014-01-25 ENCOUNTER — Encounter (HOSPITAL_COMMUNITY)
Admission: RE | Admit: 2014-01-25 | Discharge: 2014-01-25 | Disposition: A | Discharge status: Home / Self Care | Payer: Self-pay | Source: Ambulatory Visit | Attending: Cardiology | Admitting: Cardiology

## 2014-01-27 ENCOUNTER — Encounter (HOSPITAL_COMMUNITY)
Admission: RE | Admit: 2014-01-27 | Discharge: 2014-01-27 | Disposition: A | Discharge status: Home / Self Care | Payer: Self-pay | Source: Ambulatory Visit | Attending: Cardiology | Admitting: Cardiology

## 2014-01-28 ENCOUNTER — Encounter (HOSPITAL_COMMUNITY)
Admission: RE | Admit: 2014-01-28 | Discharge: 2014-01-28 | Disposition: A | Discharge status: Home / Self Care | Payer: Self-pay | Source: Ambulatory Visit | Attending: Cardiology | Admitting: Cardiology

## 2014-02-01 ENCOUNTER — Encounter (HOSPITAL_COMMUNITY): Payer: Medicare Other

## 2014-02-01 DIAGNOSIS — E8881 Metabolic syndrome: Secondary | ICD-10-CM | POA: Insufficient documentation

## 2014-02-01 DIAGNOSIS — I10 Essential (primary) hypertension: Secondary | ICD-10-CM | POA: Insufficient documentation

## 2014-02-01 DIAGNOSIS — I2510 Atherosclerotic heart disease of native coronary artery without angina pectoris: Secondary | ICD-10-CM | POA: Insufficient documentation

## 2014-02-01 DIAGNOSIS — Z7982 Long term (current) use of aspirin: Secondary | ICD-10-CM | POA: Insufficient documentation

## 2014-02-01 DIAGNOSIS — E785 Hyperlipidemia, unspecified: Secondary | ICD-10-CM | POA: Insufficient documentation

## 2014-02-01 DIAGNOSIS — R0602 Shortness of breath: Secondary | ICD-10-CM | POA: Insufficient documentation

## 2014-02-01 DIAGNOSIS — Z5189 Encounter for other specified aftercare: Secondary | ICD-10-CM | POA: Insufficient documentation

## 2014-02-01 DIAGNOSIS — E663 Overweight: Secondary | ICD-10-CM | POA: Insufficient documentation

## 2014-02-01 DIAGNOSIS — Z9861 Coronary angioplasty status: Secondary | ICD-10-CM | POA: Insufficient documentation

## 2014-02-01 DIAGNOSIS — I209 Angina pectoris, unspecified: Secondary | ICD-10-CM | POA: Insufficient documentation

## 2014-02-03 ENCOUNTER — Encounter (HOSPITAL_COMMUNITY)
Admission: RE | Admit: 2014-02-03 | Discharge: 2014-02-03 | Disposition: A | Discharge status: Home / Self Care | Payer: Self-pay | Source: Ambulatory Visit | Attending: Cardiology | Admitting: Cardiology

## 2014-02-04 ENCOUNTER — Encounter (HOSPITAL_COMMUNITY)
Admission: RE | Admit: 2014-02-04 | Discharge: 2014-02-04 | Disposition: A | Discharge status: Home / Self Care | Payer: Self-pay | Source: Ambulatory Visit | Attending: Cardiology | Admitting: Cardiology

## 2014-02-08 ENCOUNTER — Encounter (HOSPITAL_COMMUNITY)
Admission: RE | Admit: 2014-02-08 | Discharge: 2014-02-08 | Disposition: A | Discharge status: Home / Self Care | Payer: Self-pay | Source: Ambulatory Visit | Attending: Cardiology | Admitting: Cardiology

## 2014-02-10 ENCOUNTER — Encounter (HOSPITAL_COMMUNITY)
Admission: RE | Admit: 2014-02-10 | Discharge: 2014-02-10 | Disposition: A | Discharge status: Home / Self Care | Payer: Self-pay | Source: Ambulatory Visit | Attending: Cardiology | Admitting: Cardiology

## 2014-02-11 ENCOUNTER — Encounter (HOSPITAL_COMMUNITY)
Admission: RE | Admit: 2014-02-11 | Discharge: 2014-02-11 | Disposition: A | Discharge status: Home / Self Care | Payer: Self-pay | Source: Ambulatory Visit | Attending: Cardiology | Admitting: Cardiology

## 2014-02-15 ENCOUNTER — Encounter (HOSPITAL_COMMUNITY)
Admission: RE | Admit: 2014-02-15 | Discharge: 2014-02-15 | Disposition: A | Discharge status: Home / Self Care | Payer: Self-pay | Source: Ambulatory Visit | Attending: Cardiology | Admitting: Cardiology

## 2014-02-17 ENCOUNTER — Encounter (HOSPITAL_COMMUNITY)
Admission: RE | Admit: 2014-02-17 | Discharge: 2014-02-17 | Disposition: A | Discharge status: Home / Self Care | Payer: Self-pay | Source: Ambulatory Visit | Attending: Cardiology | Admitting: Cardiology

## 2014-02-18 ENCOUNTER — Encounter (HOSPITAL_COMMUNITY)
Admission: RE | Admit: 2014-02-18 | Discharge: 2014-02-18 | Disposition: A | Discharge status: Home / Self Care | Payer: Self-pay | Source: Ambulatory Visit | Attending: Cardiology | Admitting: Cardiology

## 2014-02-22 ENCOUNTER — Encounter (HOSPITAL_COMMUNITY): Payer: Self-pay

## 2014-02-24 ENCOUNTER — Encounter (HOSPITAL_COMMUNITY): Payer: Self-pay

## 2014-02-25 ENCOUNTER — Encounter (HOSPITAL_COMMUNITY): Payer: Self-pay

## 2014-03-01 ENCOUNTER — Encounter (HOSPITAL_COMMUNITY): Payer: Self-pay

## 2014-03-03 ENCOUNTER — Encounter (HOSPITAL_COMMUNITY): Payer: Self-pay

## 2014-03-03 DIAGNOSIS — E8881 Metabolic syndrome: Secondary | ICD-10-CM | POA: Insufficient documentation

## 2014-03-03 DIAGNOSIS — Z9861 Coronary angioplasty status: Secondary | ICD-10-CM | POA: Insufficient documentation

## 2014-03-03 DIAGNOSIS — I2510 Atherosclerotic heart disease of native coronary artery without angina pectoris: Secondary | ICD-10-CM | POA: Insufficient documentation

## 2014-03-03 DIAGNOSIS — I209 Angina pectoris, unspecified: Secondary | ICD-10-CM | POA: Insufficient documentation

## 2014-03-03 DIAGNOSIS — I10 Essential (primary) hypertension: Secondary | ICD-10-CM | POA: Insufficient documentation

## 2014-03-03 DIAGNOSIS — Z5189 Encounter for other specified aftercare: Secondary | ICD-10-CM | POA: Insufficient documentation

## 2014-03-03 DIAGNOSIS — E663 Overweight: Secondary | ICD-10-CM | POA: Insufficient documentation

## 2014-03-03 DIAGNOSIS — E785 Hyperlipidemia, unspecified: Secondary | ICD-10-CM | POA: Insufficient documentation

## 2014-03-03 DIAGNOSIS — Z7982 Long term (current) use of aspirin: Secondary | ICD-10-CM | POA: Insufficient documentation

## 2014-03-03 DIAGNOSIS — R0602 Shortness of breath: Secondary | ICD-10-CM | POA: Insufficient documentation

## 2014-03-04 ENCOUNTER — Encounter (HOSPITAL_COMMUNITY): Payer: Self-pay

## 2014-03-08 ENCOUNTER — Encounter (HOSPITAL_COMMUNITY)
Admission: RE | Admit: 2014-03-08 | Discharge: 2014-03-08 | Disposition: A | Discharge status: Home / Self Care | Payer: Self-pay | Source: Ambulatory Visit | Attending: Cardiology | Admitting: Cardiology

## 2014-03-10 ENCOUNTER — Encounter (HOSPITAL_COMMUNITY)
Admission: RE | Admit: 2014-03-10 | Discharge: 2014-03-10 | Disposition: A | Discharge status: Home / Self Care | Payer: Self-pay | Source: Ambulatory Visit | Attending: Cardiology | Admitting: Cardiology

## 2014-03-11 ENCOUNTER — Encounter (HOSPITAL_COMMUNITY)
Admission: RE | Admit: 2014-03-11 | Discharge: 2014-03-11 | Disposition: A | Discharge status: Home / Self Care | Payer: Self-pay | Source: Ambulatory Visit | Attending: Cardiology | Admitting: Cardiology

## 2014-03-15 ENCOUNTER — Encounter (HOSPITAL_COMMUNITY)
Admission: RE | Admit: 2014-03-15 | Discharge: 2014-03-15 | Disposition: A | Discharge status: Home / Self Care | Payer: Self-pay | Source: Ambulatory Visit | Attending: Cardiology | Admitting: Cardiology

## 2014-03-17 ENCOUNTER — Encounter (HOSPITAL_COMMUNITY)
Admission: RE | Admit: 2014-03-17 | Discharge: 2014-03-17 | Disposition: A | Discharge status: Home / Self Care | Payer: Self-pay | Source: Ambulatory Visit | Attending: Cardiology | Admitting: Cardiology

## 2014-03-18 ENCOUNTER — Encounter (HOSPITAL_COMMUNITY)
Admission: RE | Admit: 2014-03-18 | Discharge: 2014-03-18 | Disposition: A | Discharge status: Home / Self Care | Payer: Self-pay | Source: Ambulatory Visit | Attending: Cardiology | Admitting: Cardiology

## 2014-03-22 ENCOUNTER — Encounter (HOSPITAL_COMMUNITY)
Admission: RE | Admit: 2014-03-22 | Discharge: 2014-03-22 | Disposition: A | Discharge status: Home / Self Care | Payer: Self-pay | Source: Ambulatory Visit | Attending: Cardiology | Admitting: Cardiology

## 2014-03-24 ENCOUNTER — Encounter (HOSPITAL_COMMUNITY)
Admission: RE | Admit: 2014-03-24 | Discharge: 2014-03-24 | Disposition: A | Discharge status: Home / Self Care | Payer: Self-pay | Source: Ambulatory Visit | Attending: Cardiology | Admitting: Cardiology

## 2014-03-25 ENCOUNTER — Encounter (HOSPITAL_COMMUNITY): Payer: Self-pay

## 2014-03-29 ENCOUNTER — Encounter (HOSPITAL_COMMUNITY): Payer: Self-pay

## 2014-03-31 ENCOUNTER — Encounter (HOSPITAL_COMMUNITY)
Admission: RE | Admit: 2014-03-31 | Discharge: 2014-03-31 | Disposition: A | Discharge status: Home / Self Care | Payer: Self-pay | Source: Ambulatory Visit | Attending: Cardiology | Admitting: Cardiology

## 2014-04-01 ENCOUNTER — Encounter (HOSPITAL_COMMUNITY)
Admission: RE | Admit: 2014-04-01 | Discharge: 2014-04-01 | Disposition: A | Discharge status: Home / Self Care | Payer: Self-pay | Source: Ambulatory Visit | Attending: Cardiology | Admitting: Cardiology

## 2014-04-05 ENCOUNTER — Encounter (HOSPITAL_COMMUNITY)
Admission: RE | Admit: 2014-04-05 | Discharge: 2014-04-05 | Disposition: A | Discharge status: Home / Self Care | Payer: Self-pay | Source: Ambulatory Visit | Attending: Cardiology | Admitting: Cardiology

## 2014-04-05 DIAGNOSIS — I2510 Atherosclerotic heart disease of native coronary artery without angina pectoris: Secondary | ICD-10-CM | POA: Insufficient documentation

## 2014-04-05 DIAGNOSIS — I4891 Unspecified atrial fibrillation: Secondary | ICD-10-CM | POA: Insufficient documentation

## 2014-04-05 DIAGNOSIS — Z5189 Encounter for other specified aftercare: Secondary | ICD-10-CM | POA: Insufficient documentation

## 2014-04-07 ENCOUNTER — Encounter (HOSPITAL_COMMUNITY)
Admission: RE | Admit: 2014-04-07 | Discharge: 2014-04-07 | Disposition: A | Discharge status: Home / Self Care | Payer: Self-pay | Source: Ambulatory Visit | Attending: Cardiology | Admitting: Cardiology

## 2014-04-08 ENCOUNTER — Encounter (HOSPITAL_COMMUNITY)
Admission: RE | Admit: 2014-04-08 | Discharge: 2014-04-08 | Disposition: A | Discharge status: Home / Self Care | Payer: Self-pay | Source: Ambulatory Visit | Attending: Cardiology | Admitting: Cardiology

## 2014-04-12 ENCOUNTER — Encounter (HOSPITAL_COMMUNITY)
Admission: RE | Admit: 2014-04-12 | Discharge: 2014-04-12 | Disposition: A | Discharge status: Home / Self Care | Payer: Self-pay | Source: Ambulatory Visit | Attending: Cardiology | Admitting: Cardiology

## 2014-04-14 ENCOUNTER — Encounter (HOSPITAL_COMMUNITY)
Admission: RE | Admit: 2014-04-14 | Discharge: 2014-04-14 | Disposition: A | Discharge status: Home / Self Care | Payer: Self-pay | Source: Ambulatory Visit | Attending: Cardiology | Admitting: Cardiology

## 2014-04-15 ENCOUNTER — Encounter (HOSPITAL_COMMUNITY)
Admission: RE | Admit: 2014-04-15 | Discharge: 2014-04-15 | Disposition: A | Discharge status: Home / Self Care | Payer: Self-pay | Source: Ambulatory Visit | Attending: Cardiology | Admitting: Cardiology

## 2014-04-19 ENCOUNTER — Encounter (HOSPITAL_COMMUNITY)
Admission: RE | Admit: 2014-04-19 | Discharge: 2014-04-19 | Disposition: A | Discharge status: Home / Self Care | Payer: Self-pay | Source: Ambulatory Visit | Attending: Cardiology | Admitting: Cardiology

## 2014-04-21 ENCOUNTER — Encounter (HOSPITAL_COMMUNITY)
Admission: RE | Admit: 2014-04-21 | Discharge: 2014-04-21 | Disposition: A | Discharge status: Home / Self Care | Payer: Self-pay | Source: Ambulatory Visit | Attending: Cardiology | Admitting: Cardiology

## 2014-04-22 ENCOUNTER — Encounter (HOSPITAL_COMMUNITY)
Admission: RE | Admit: 2014-04-22 | Discharge: 2014-04-22 | Disposition: A | Discharge status: Home / Self Care | Payer: Self-pay | Source: Ambulatory Visit | Attending: Cardiology | Admitting: Cardiology

## 2014-04-26 ENCOUNTER — Encounter (HOSPITAL_COMMUNITY)
Admission: RE | Admit: 2014-04-26 | Discharge: 2014-04-26 | Disposition: A | Discharge status: Home / Self Care | Payer: Self-pay | Source: Ambulatory Visit | Attending: Cardiology | Admitting: Cardiology

## 2014-04-27 ENCOUNTER — Telehealth: Payer: Self-pay | Admitting: Cardiology

## 2014-04-27 NOTE — Telephone Encounter (Signed)
Please sent to [place]

## 2014-04-27 NOTE — Telephone Encounter (Signed)
Mrs. Keenum is calling about Mr.Belenda Cruise needing two new prescriptions of Carvediolol 12.5mg  -quanity of 270 and Losartan 100mg  90 (74mth supply for both )  sent to [address] [place] .Marland KitchenPlease call if have any more questions    Thanks

## 2014-04-28 ENCOUNTER — Encounter (HOSPITAL_COMMUNITY)
Admission: RE | Admit: 2014-04-28 | Discharge: 2014-04-28 | Disposition: A | Discharge status: Home / Self Care | Payer: Self-pay | Source: Ambulatory Visit | Attending: Cardiology | Admitting: Cardiology

## 2014-04-29 ENCOUNTER — Encounter (HOSPITAL_COMMUNITY)
Admission: RE | Admit: 2014-04-29 | Discharge: 2014-04-29 | Disposition: A | Discharge status: Home / Self Care | Payer: Self-pay | Source: Ambulatory Visit | Attending: Cardiology | Admitting: Cardiology

## 2014-04-29 ENCOUNTER — Other Ambulatory Visit: Payer: Self-pay

## 2014-04-29 MED ORDER — CARVEDILOL 12.5 MG PO TABS: ORAL_TABLET | ORAL | Status: DC

## 2014-04-29 MED ORDER — LOSARTAN POTASSIUM 100 MG PO TABS: 100.0000 mg | ORAL_TABLET | Freq: Every day | ORAL | Status: DC

## 2014-05-02 DIAGNOSIS — L570 Actinic keratosis: Secondary | ICD-10-CM | POA: Diagnosis not present

## 2014-05-02 DIAGNOSIS — L820 Inflamed seborrheic keratosis: Secondary | ICD-10-CM | POA: Diagnosis not present

## 2014-05-02 DIAGNOSIS — D485 Neoplasm of uncertain behavior of skin: Secondary | ICD-10-CM | POA: Diagnosis not present

## 2014-05-03 ENCOUNTER — Encounter (HOSPITAL_COMMUNITY)
Admission: RE | Admit: 2014-05-03 | Discharge: 2014-05-03 | Disposition: A | Discharge status: Home / Self Care | Payer: Self-pay | Source: Ambulatory Visit | Attending: Cardiology | Admitting: Cardiology

## 2014-05-03 DIAGNOSIS — I4891 Unspecified atrial fibrillation: Secondary | ICD-10-CM | POA: Insufficient documentation

## 2014-05-03 DIAGNOSIS — Z5189 Encounter for other specified aftercare: Secondary | ICD-10-CM | POA: Insufficient documentation

## 2014-05-03 DIAGNOSIS — I2510 Atherosclerotic heart disease of native coronary artery without angina pectoris: Secondary | ICD-10-CM | POA: Insufficient documentation

## 2014-05-05 ENCOUNTER — Encounter (HOSPITAL_COMMUNITY)
Admission: RE | Admit: 2014-05-05 | Discharge: 2014-05-05 | Disposition: A | Discharge status: Home / Self Care | Payer: Self-pay | Source: Ambulatory Visit | Attending: Cardiology | Admitting: Cardiology

## 2014-05-05 ENCOUNTER — Other Ambulatory Visit (INDEPENDENT_AMBULATORY_CARE_PROVIDER_SITE_OTHER): Payer: Medicare Other

## 2014-05-05 DIAGNOSIS — E785 Hyperlipidemia, unspecified: Secondary | ICD-10-CM

## 2014-05-05 DIAGNOSIS — I10 Essential (primary) hypertension: Secondary | ICD-10-CM | POA: Diagnosis not present

## 2014-05-05 DIAGNOSIS — I2510 Atherosclerotic heart disease of native coronary artery without angina pectoris: Secondary | ICD-10-CM

## 2014-05-05 LAB — BASIC METABOLIC PANEL
BUN: 16 mg/dL (ref 6–23)
CO2: 27 mEq/L (ref 19–32)
Calcium: 9.2 mg/dL (ref 8.4–10.5)
Chloride: 109 mEq/L (ref 96–112)
Creatinine, Ser: 0.9 mg/dL (ref 0.4–1.5)
GFR: 85.63 mL/min (ref >60.00)
Glucose, Bld: 106 mg/dL — ABNORMAL HIGH (ref 70–99)
Potassium: 4 mEq/L (ref 3.5–5.1)
Sodium: 141 mEq/L (ref 135–145)

## 2014-05-05 LAB — LIPID PANEL
Cholesterol: 104 mg/dL (ref 0–200)
HDL: 39.8 mg/dL (ref >39.00)
LDL Cholesterol: 46 mg/dL (ref 0–99)
NonHDL: 64.2
Total CHOL/HDL Ratio: 3
Triglycerides: 92 mg/dL (ref 0.0–149.0)
VLDL: 18.4 mg/dL (ref 0.0–40.0)

## 2014-05-05 LAB — HEPATIC FUNCTION PANEL
ALT: 27 U/L (ref 0–53)
AST: 25 U/L (ref 0–37)
Albumin: 4 g/dL (ref 3.5–5.2)
Alkaline Phosphatase: 65 U/L (ref 39–117)
Bilirubin, Direct: 0.1 mg/dL (ref 0.0–0.3)
Total Bilirubin: 1.1 mg/dL (ref 0.2–1.2)
Total Protein: 6.9 g/dL (ref 6.0–8.3)

## 2014-05-06 ENCOUNTER — Encounter (HOSPITAL_COMMUNITY)
Admission: RE | Admit: 2014-05-06 | Discharge: 2014-05-06 | Disposition: A | Discharge status: Home / Self Care | Payer: Self-pay | Source: Ambulatory Visit | Attending: Cardiology | Admitting: Cardiology

## 2014-05-10 ENCOUNTER — Encounter (HOSPITAL_COMMUNITY)
Admission: RE | Admit: 2014-05-10 | Discharge: 2014-05-10 | Disposition: A | Discharge status: Home / Self Care | Payer: Self-pay | Source: Ambulatory Visit | Attending: Cardiology | Admitting: Cardiology

## 2014-05-12 ENCOUNTER — Encounter (HOSPITAL_COMMUNITY)
Admission: RE | Admit: 2014-05-12 | Discharge: 2014-05-12 | Disposition: A | Discharge status: Home / Self Care | Payer: Self-pay | Source: Ambulatory Visit | Attending: Cardiology | Admitting: Cardiology

## 2014-05-13 ENCOUNTER — Encounter (HOSPITAL_COMMUNITY): Payer: Self-pay

## 2014-05-13 ENCOUNTER — Encounter: Payer: Self-pay | Admitting: Cardiology

## 2014-05-13 ENCOUNTER — Ambulatory Visit (INDEPENDENT_AMBULATORY_CARE_PROVIDER_SITE_OTHER): Payer: Medicare Other | Admitting: Cardiology

## 2014-05-13 VITALS — BP 164/80 | HR 58 | Ht 67.0 in | Wt 193.8 lb

## 2014-05-13 DIAGNOSIS — I2510 Atherosclerotic heart disease of native coronary artery without angina pectoris: Secondary | ICD-10-CM

## 2014-05-13 DIAGNOSIS — I209 Angina pectoris, unspecified: Secondary | ICD-10-CM

## 2014-05-13 DIAGNOSIS — E119 Type 2 diabetes mellitus without complications: Secondary | ICD-10-CM

## 2014-05-13 DIAGNOSIS — I480 Paroxysmal atrial fibrillation: Secondary | ICD-10-CM

## 2014-05-13 DIAGNOSIS — E785 Hyperlipidemia, unspecified: Secondary | ICD-10-CM | POA: Diagnosis not present

## 2014-05-13 DIAGNOSIS — I25119 Atherosclerotic heart disease of native coronary artery with unspecified angina pectoris: Secondary | ICD-10-CM

## 2014-05-13 DIAGNOSIS — I4891 Unspecified atrial fibrillation: Secondary | ICD-10-CM | POA: Diagnosis not present

## 2014-05-13 NOTE — Patient Instructions (Signed)
Continue your current therapy and we will monitor your blood pressure at Rehab  I will see you in 6 months.

## 2014-05-13 NOTE — Progress Notes (Signed)
Jacob Curry Date of Birth: [birth year] Medical Record #[id number]  History of Present Illness: Jacob Curry is seen as a work in today for follow up. He has a known history of coronary disease with cardiac catheterization in May of 2011 showing total occlusion of a large left circumflex vessel with good left to left collaterals. Ejection fraction was 50-55%. He has been managed medically. He has a history of hypertension, hyperlipidemia, Pafib,  diabetes. He has a history of moderate obstructive sleep apnea. This is managed with an oral appliance. He was unable to tolerate CPAP therapy.  He is anticoagulated with Eliquis. He denies any palpitations, SOB, or chest pain. Vial signs at cardiac Rehab have been normal including BP.   Current Outpatient Prescriptions on File Prior to Visit  Medication Sig Dispense Refill  . apixaban (ELIQUIS) 5 MG TABS tablet Take 1 tablet (5 mg total) by mouth 2 (two) times daily.  60 tablet  11  . carvedilol (COREG) 12.5 MG tablet TAKE 1 AND 1/2 TABLETS TWICE DAILY WITH MEALS  270 tablet  0  . fish oil-omega-3 fatty acids 1000 MG capsule Take 2 g by mouth daily.        Marland Kitchen losartan (COZAAR) 100 MG tablet Take 1 tablet (100 mg total) by mouth daily.  90 tablet  0  . metFORMIN (GLUCOPHAGE) 500 MG tablet Take one tablet by mouth daily  with meals      . Multiple Vitamin (MULTIVITAMIN) tablet Take 1 tablet by mouth daily.      . nitroGLYCERIN (NITROSTAT) 0.4 MG SL tablet Place 1 tablet (0.4 mg total) under the tongue every 5 (five) minutes as needed.  25 tablet  prn  . Saw Palmetto, Serenoa repens, (SAW PALMETTO PO) Take 1 tablet by mouth daily.        . simvastatin (ZOCOR) 40 MG tablet TAKE 1 TABLET AT BEDTIME  90 tablet  3   No current facility-administered medications on file prior to visit.    Allergies  Allergen Reactions  . Acetaminophen     Drug induced hepatitis  . Oxycodone-Acetaminophen     Drug induced hepatitis    Past Medical History  Diagnosis Date   . CAD (coronary artery disease)     Dr Peter Martinique  . HTN (hypertension)   . HLD (hyperlipidemia)   . DM (diabetes mellitus)   . ED (erectile dysfunction)   . Carotid stenosis   . Obesity   . OSA (obstructive sleep apnea)     oral appliance, Dr Ron Parker  . Paroxysmal atrial fibrillation     Past Surgical History  Procedure Laterality Date  . Appendectomy  1958  . Ankle fracture surgery  1993  . Hand surgery  1965  . Tonsillectomy    . Cardiac catheterization  2007 & 2011  . Colonoscopy  2002    negative; Dr Olevia Perches    History  Smoking status  . Former Smoker -- 1.00 packs/day  . Quit date: 09/02/1990  Smokeless tobacco  . Never Used    Comment: onset age 28-50 up to 1 ppd    History  Alcohol Use No    Family History  Problem Relation Age of Onset  . Diabetes Mother   . Colon cancer Paternal Uncle   . Heart attack Father      4 vessel CBAG @ 34  . Diabetes Brother   . Stroke Neg Hx     Review of Systems: As noted in history of present illness.  All other systems were reviewed and are negative.  Physical Exam: BP 164/80  Pulse 58  Ht 5\' 7"  (1.702 m)  Wt 193 lb 12.8 oz (87.907 kg)  BMI 30.35 kg/m2 He is a pleasant white male in no acute distress. HEENT exam is unremarkable. He has no jugular venous distention or bruits. There is no adenopathy or thyromegaly. Cardiac exam reveals a regular rate and rhythm with occasional extrasystole. There is no  gallop, murmur, or click. PMI is normal. Lungs are clear. Abdomen is soft and nontender without masses or bruits. He has normal bowel sounds. Extremities are without cyanosis or edema. Pedal pulses are 2+ and symmetric. He is alert and oriented x3. Cranial nerves II through XII are intact. He has no focal findings.  LABORATORY DATA: Lab Results  Component Value Date   WBC 8.6 11/15/2013   HGB 14.4 11/15/2013   HCT 43.4 11/15/2013   PLT 215.0 11/15/2013   GLUCOSE 106* 05/05/2014   CHOL 104 05/05/2014   TRIG 92.0 05/05/2014    HDL 39.80 05/05/2014   LDLDIRECT 39.9 07/22/2008   LDLCALC 46 05/05/2014   ALT 27 05/05/2014   AST 25 05/05/2014   NA 141 05/05/2014   K 4.0 05/05/2014   CL 109 05/05/2014   CREATININE 0.9 05/05/2014   BUN 16 05/05/2014   CO2 27 05/05/2014   TSH 2.27 03/09/2012   PSA 1.70 01/04/2009   INR 1.1 ratio* 01/15/2010   HGBA1C 6.4 11/15/2013   MICROALBUR 0.4 03/09/2012   Ecg: NSR, possible old anterior infarct. No acute change.    Assessment / Plan: 1. 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 Patient is asymptomatic on medical therapy.   2. Hypertension, controlled per cardiac rehabilitation notes. BP elevated today. Will monitor for now.  3. Hyperlipidemia well controlled on medication.  His lipids are excellent.  4. Atrial fibrillation, paroxysmal. Patient is  in sinus rhythm. His rate was controlled on beta blocker therapy. He was minimally symptomatic. He does have a high Chadvasc score of 4. Continue anticoagulation with Eliquis 5 mg twice a day.   I will follow up in 6 months.

## 2014-05-17 ENCOUNTER — Encounter (HOSPITAL_COMMUNITY)
Admission: RE | Admit: 2014-05-17 | Discharge: 2014-05-17 | Disposition: A | Discharge status: Home / Self Care | Payer: Self-pay | Source: Ambulatory Visit | Attending: Cardiology | Admitting: Cardiology

## 2014-05-19 ENCOUNTER — Encounter (HOSPITAL_COMMUNITY)
Admission: RE | Admit: 2014-05-19 | Discharge: 2014-05-19 | Disposition: A | Discharge status: Home / Self Care | Payer: Self-pay | Source: Ambulatory Visit | Attending: Cardiology | Admitting: Cardiology

## 2014-05-20 ENCOUNTER — Encounter (HOSPITAL_COMMUNITY)
Admission: RE | Admit: 2014-05-20 | Discharge: 2014-05-20 | Disposition: A | Discharge status: Home / Self Care | Payer: Medicare Other | Source: Ambulatory Visit | Attending: Cardiology | Admitting: Cardiology

## 2014-05-24 ENCOUNTER — Encounter (HOSPITAL_COMMUNITY): Payer: Self-pay

## 2014-05-26 ENCOUNTER — Encounter (HOSPITAL_COMMUNITY): Payer: Self-pay

## 2014-05-27 ENCOUNTER — Encounter (HOSPITAL_COMMUNITY): Payer: Self-pay

## 2014-05-31 ENCOUNTER — Encounter (HOSPITAL_COMMUNITY)
Admission: RE | Admit: 2014-05-31 | Discharge: 2014-05-31 | Disposition: A | Discharge status: Home / Self Care | Payer: Self-pay | Source: Ambulatory Visit | Attending: Cardiology | Admitting: Cardiology

## 2014-06-02 ENCOUNTER — Encounter (HOSPITAL_COMMUNITY)
Admission: RE | Admit: 2014-06-02 | Discharge: 2014-06-02 | Disposition: A | Discharge status: Home / Self Care | Payer: Self-pay | Source: Ambulatory Visit | Attending: Cardiology | Admitting: Cardiology

## 2014-06-02 DIAGNOSIS — I2510 Atherosclerotic heart disease of native coronary artery without angina pectoris: Secondary | ICD-10-CM | POA: Insufficient documentation

## 2014-06-02 DIAGNOSIS — I4891 Unspecified atrial fibrillation: Secondary | ICD-10-CM | POA: Insufficient documentation

## 2014-06-03 ENCOUNTER — Encounter (HOSPITAL_COMMUNITY)
Admission: RE | Admit: 2014-06-03 | Discharge: 2014-06-03 | Disposition: A | Discharge status: Home / Self Care | Payer: Self-pay | Source: Ambulatory Visit | Attending: Cardiology | Admitting: Cardiology

## 2014-06-07 ENCOUNTER — Encounter (HOSPITAL_COMMUNITY)
Admission: RE | Admit: 2014-06-07 | Discharge: 2014-06-07 | Disposition: A | Discharge status: Home / Self Care | Payer: Self-pay | Source: Ambulatory Visit | Attending: Cardiology | Admitting: Cardiology

## 2014-06-09 ENCOUNTER — Encounter (HOSPITAL_COMMUNITY)
Admission: RE | Admit: 2014-06-09 | Discharge: 2014-06-09 | Disposition: A | Discharge status: Home / Self Care | Payer: Self-pay | Source: Ambulatory Visit | Attending: Cardiology | Admitting: Cardiology

## 2014-06-10 ENCOUNTER — Encounter (HOSPITAL_COMMUNITY)
Admission: RE | Admit: 2014-06-10 | Discharge: 2014-06-10 | Disposition: A | Discharge status: Home / Self Care | Payer: Self-pay | Source: Ambulatory Visit | Attending: Cardiology | Admitting: Cardiology

## 2014-06-13 ENCOUNTER — Telehealth: Payer: Self-pay | Admitting: Cardiology

## 2014-06-13 NOTE — Telephone Encounter (Signed)
Spoke with pt, aware eliquis samples at the front desk for pick up. He is concerned about his bp at rehab. Patient is going to have rehab send dr Martinique a copy of his bp for dr Martinique to review.

## 2014-06-13 NOTE — Telephone Encounter (Signed)
Pt's wife called in stating that Jacob Curry is about to reach the donut hole with his medication and would like to know if he could have some samples of Eliquis. Please call  Thanks

## 2014-06-14 ENCOUNTER — Encounter (HOSPITAL_COMMUNITY)
Admission: RE | Admit: 2014-06-14 | Discharge: 2014-06-14 | Disposition: A | Discharge status: Home / Self Care | Payer: Self-pay | Source: Ambulatory Visit | Attending: Cardiology | Admitting: Cardiology

## 2014-06-16 ENCOUNTER — Encounter (HOSPITAL_COMMUNITY): Payer: Self-pay

## 2014-06-17 ENCOUNTER — Encounter (HOSPITAL_COMMUNITY): Payer: Self-pay

## 2014-06-20 DIAGNOSIS — D485 Neoplasm of uncertain behavior of skin: Secondary | ICD-10-CM | POA: Diagnosis not present

## 2014-06-20 DIAGNOSIS — L570 Actinic keratosis: Secondary | ICD-10-CM | POA: Diagnosis not present

## 2014-06-20 DIAGNOSIS — L814 Other melanin hyperpigmentation: Secondary | ICD-10-CM | POA: Diagnosis not present

## 2014-06-20 DIAGNOSIS — D225 Melanocytic nevi of trunk: Secondary | ICD-10-CM | POA: Diagnosis not present

## 2014-06-21 ENCOUNTER — Encounter (HOSPITAL_COMMUNITY)
Admission: RE | Admit: 2014-06-21 | Discharge: 2014-06-21 | Disposition: A | Discharge status: Home / Self Care | Payer: Self-pay | Source: Ambulatory Visit | Attending: Cardiology | Admitting: Cardiology

## 2014-06-23 ENCOUNTER — Encounter (HOSPITAL_COMMUNITY)
Admission: RE | Admit: 2014-06-23 | Discharge: 2014-06-23 | Disposition: A | Discharge status: Home / Self Care | Payer: Self-pay | Source: Ambulatory Visit | Attending: Cardiology | Admitting: Cardiology

## 2014-06-24 ENCOUNTER — Encounter (HOSPITAL_COMMUNITY)
Admission: RE | Admit: 2014-06-24 | Discharge: 2014-06-24 | Disposition: A | Discharge status: Home / Self Care | Payer: Self-pay | Source: Ambulatory Visit | Attending: Cardiology | Admitting: Cardiology

## 2014-06-28 ENCOUNTER — Encounter (HOSPITAL_COMMUNITY): Payer: Self-pay

## 2014-06-30 ENCOUNTER — Encounter (HOSPITAL_COMMUNITY)
Admission: RE | Admit: 2014-06-30 | Discharge: 2014-06-30 | Disposition: A | Discharge status: Home / Self Care | Payer: Self-pay | Source: Ambulatory Visit | Attending: Cardiology | Admitting: Cardiology

## 2014-07-01 ENCOUNTER — Encounter (HOSPITAL_COMMUNITY)
Admission: RE | Admit: 2014-07-01 | Discharge: 2014-07-01 | Disposition: A | Discharge status: Home / Self Care | Payer: Self-pay | Source: Ambulatory Visit | Attending: Cardiology | Admitting: Cardiology

## 2014-07-04 ENCOUNTER — Ambulatory Visit (INDEPENDENT_AMBULATORY_CARE_PROVIDER_SITE_OTHER): Payer: Medicare Other

## 2014-07-04 ENCOUNTER — Other Ambulatory Visit (INDEPENDENT_AMBULATORY_CARE_PROVIDER_SITE_OTHER): Payer: Medicare Other

## 2014-07-04 ENCOUNTER — Encounter: Payer: Self-pay | Admitting: Internal Medicine

## 2014-07-04 ENCOUNTER — Ambulatory Visit (INDEPENDENT_AMBULATORY_CARE_PROVIDER_SITE_OTHER): Payer: Medicare Other | Admitting: Internal Medicine

## 2014-07-04 VITALS — BP 188/106 | HR 61 | Temp 97.5°F | Resp 14 | Ht 68.0 in | Wt 194.5 lb

## 2014-07-04 DIAGNOSIS — Z23 Encounter for immunization: Secondary | ICD-10-CM | POA: Diagnosis not present

## 2014-07-04 DIAGNOSIS — Z1211 Encounter for screening for malignant neoplasm of colon: Secondary | ICD-10-CM

## 2014-07-04 DIAGNOSIS — F528 Other sexual dysfunction not due to a substance or known physiological condition: Secondary | ICD-10-CM | POA: Diagnosis not present

## 2014-07-04 DIAGNOSIS — R7309 Other abnormal glucose: Secondary | ICD-10-CM

## 2014-07-04 DIAGNOSIS — I2510 Atherosclerotic heart disease of native coronary artery without angina pectoris: Secondary | ICD-10-CM

## 2014-07-04 DIAGNOSIS — E785 Hyperlipidemia, unspecified: Secondary | ICD-10-CM | POA: Diagnosis not present

## 2014-07-04 DIAGNOSIS — R7303 Prediabetes: Secondary | ICD-10-CM

## 2014-07-04 DIAGNOSIS — I10 Essential (primary) hypertension: Secondary | ICD-10-CM | POA: Diagnosis not present

## 2014-07-04 LAB — HEMOGLOBIN A1C: Hgb A1c MFr Bld: 6.2 % (ref 4.6–6.5)

## 2014-07-04 MED ORDER — METFORMIN HCL 500 MG PO TABS: 500.0000 mg | ORAL_TABLET | Freq: Every day | ORAL | Status: DC

## 2014-07-04 NOTE — Progress Notes (Signed)
Pre visit review using our clinic review tool, if applicable. No additional management support is needed unless otherwise documented below in the visit note. 

## 2014-07-04 NOTE — Assessment & Plan Note (Signed)
A1c

## 2014-07-04 NOTE — Assessment & Plan Note (Signed)
Trial of Cialis Not to take with NTG

## 2014-07-04 NOTE — Assessment & Plan Note (Signed)
As per  Dr Martinique; labs reviewed

## 2014-07-04 NOTE — Assessment & Plan Note (Signed)
Blood pressure goals reviewed.; recheck 145/95 BMET as per Dr Martinique

## 2014-07-04 NOTE — Progress Notes (Signed)
   Subjective:    Patient ID: Jacob Curry, male    DOB: Aug 07, 1941, 73 y.o.   MRN: [id number]  HPI He is here to assess active health issues & conditions. PMH, FH, & Social history verified & updated   He is compliant with his medications without adverse effects.  His blood pressure ranges 120-140 over the 60s at cardiac rehabilitation which he attends 3 days a week. He also walks most days 10,000-20,000 steps without cardiac or pulmonary symptoms.  Ophthalmologic exam is up-to-date. He is not checking sugars at home.  Colonoscopy overdue.    Review of Systems  Significant headaches, epistaxis, chest pain, palpitations, exertional dyspnea, claudication, paroxysmal nocturnal dyspnea, or edema absent. No memory loss or myalgias  Polyuria, polyphagia, polydipsia absent. There is no blurred vision, double vision, or loss of vision.  Also denied are numbness, tingling, or burning of the extremities. No nonhealing skin lesions present. Weight is stable.   Unexplained weight loss, abdominal pain, significant dyspepsia, dysphagia, melena, rectal bleeding, or persistently small caliber stools are denied.     Objective:   Physical Exam Gen.: Healthy and well-nourished in appearance. Alert, appropriate and cooperative throughout exam. Head: Normocephalic without obvious abnormalities; pattern alopecia . Beard & moustache Eyes: No corneal or conjunctival inflammation noted. Pupils equal round reactive to light and accommodation. Extraocular motion intact.  Ears: External  ear exam reveals no significant lesions or deformities. Canals clear .TMs normal. Hearing is grossly normal bilaterally. Nose: External nasal exam reveals no deformity or inflammation. Nasal mucosa are pink and moist. No lesions or exudates noted.   Mouth: Oral mucosa and oropharynx reveal no lesions or exudates. Teeth in good repair. Neck: No deformities, masses, or tenderness noted. Range of motion & Thyroid normal Lungs:  Normal respiratory effort; chest expands symmetrically. Lungs are clear to auscultation without rales, wheezes, or increased work of breathing. Heart: Normal rate and rhythm. Normal S1 and S2. No gallop, click, or rub. No murmur. Abdomen: Protuberant.Bowel sounds normal; abdomen soft and nontender. No masses, organomegaly or hernias noted.                               Musculoskeletal/extremities: No deformity or scoliosis noted of  the thoracic or lumbar spine. . No clubbing, cyanosis, edema, or significant extremity  deformity noted. Range of motion normal .Tone & strength normal. Hand joints normal  Fingernail  health good. Able to lie down & sit up w/o help. Negative SLR bilaterally Vascular: Carotid, radial artery, dorsalis pedis and  posterior tibial pulses are full and equal. No bruits present. Neurologic: Alert and oriented x3. Deep tendon reflexes symmetrical and normal.  Gait normal.      Skin: Intact without suspicious lesions or rashes. Lymph: No cervical, axillary lymphadenopathy present. Psych: Mood and affect are normal. Normally interactive                                                                                        Assessment & Plan:  See Current Assessment & Plan in Problem List under specific Diagnosis

## 2014-07-04 NOTE — Patient Instructions (Signed)

## 2014-07-05 ENCOUNTER — Encounter (HOSPITAL_COMMUNITY)
Admission: RE | Admit: 2014-07-05 | Discharge: 2014-07-05 | Disposition: A | Discharge status: Home / Self Care | Payer: Self-pay | Source: Ambulatory Visit | Attending: Cardiology | Admitting: Cardiology

## 2014-07-05 ENCOUNTER — Other Ambulatory Visit: Payer: Self-pay | Admitting: Cardiology

## 2014-07-05 DIAGNOSIS — I4891 Unspecified atrial fibrillation: Secondary | ICD-10-CM | POA: Insufficient documentation

## 2014-07-05 DIAGNOSIS — I2510 Atherosclerotic heart disease of native coronary artery without angina pectoris: Secondary | ICD-10-CM | POA: Insufficient documentation

## 2014-07-06 ENCOUNTER — Encounter: Payer: Self-pay | Admitting: Physician Assistant

## 2014-07-07 ENCOUNTER — Encounter (HOSPITAL_COMMUNITY): Payer: Self-pay

## 2014-07-08 ENCOUNTER — Telehealth: Payer: Self-pay | Admitting: Cardiology

## 2014-07-08 ENCOUNTER — Encounter (HOSPITAL_COMMUNITY)
Admission: RE | Admit: 2014-07-08 | Discharge: 2014-07-08 | Disposition: A | Discharge status: Home / Self Care | Payer: Self-pay | Source: Ambulatory Visit | Attending: Cardiology | Admitting: Cardiology

## 2014-07-08 DIAGNOSIS — E785 Hyperlipidemia, unspecified: Secondary | ICD-10-CM

## 2014-07-08 DIAGNOSIS — I10 Essential (primary) hypertension: Secondary | ICD-10-CM

## 2014-07-08 MED ORDER — CARVEDILOL 12.5 MG PO TABS: ORAL_TABLET | ORAL | Status: DC

## 2014-07-08 MED ORDER — APIXABAN 5 MG PO TABS: 5.0000 mg | ORAL_TABLET | Freq: Two times a day (BID) | ORAL | Status: DC

## 2014-07-08 MED ORDER — SIMVASTATIN 40 MG PO TABS: ORAL_TABLET | ORAL | Status: DC

## 2014-07-08 MED ORDER — LOSARTAN POTASSIUM 100 MG PO TABS: ORAL_TABLET | ORAL | Status: DC

## 2014-07-08 NOTE — Telephone Encounter (Signed)
Rx was sent to pharmacy electronically. Samples provided. Patient aware.   Regarding BP...  >> patient is at cardiac rehab 3x/week. His BPs run 138-142/70s, pre exercise. He feels fine. Wanted to let Dr. Martinique know as he was instructed to keep track of BPs. He wanted to know if these readings are too high and if his medications need to be adjusted. He reports his BPs are higher at doctor's visits b/c he has white coat syndrome. He does not check BP at home  Deferred to Dr. Martinique & Malachy Mood

## 2014-07-08 NOTE — Telephone Encounter (Signed)
Calling because his bp has been running a little high. Please call  Thanks

## 2014-07-10 NOTE — Telephone Encounter (Signed)
Let's add amlodipine 2.5 mg daily to his current meds for BP.  Mamie Hundertmark Martinique MD, [facility]

## 2014-07-11 MED ORDER — AMLODIPINE BESYLATE 2.5 MG PO TABS: 2.5000 mg | ORAL_TABLET | Freq: Every day | ORAL | Status: DC

## 2014-07-11 NOTE — Addendum Note (Signed)
Addended by: Golden Hurter D on: 07/11/2014 06:27 PM   Modules accepted: Orders

## 2014-07-12 ENCOUNTER — Encounter (HOSPITAL_COMMUNITY)
Admission: RE | Admit: 2014-07-12 | Discharge: 2014-07-12 | Disposition: A | Discharge status: Home / Self Care | Payer: Self-pay | Source: Ambulatory Visit | Attending: Cardiology | Admitting: Cardiology

## 2014-07-14 ENCOUNTER — Telehealth: Payer: Self-pay | Admitting: *Deleted

## 2014-07-14 ENCOUNTER — Encounter: Payer: Self-pay | Admitting: Physician Assistant

## 2014-07-14 ENCOUNTER — Ambulatory Visit (INDEPENDENT_AMBULATORY_CARE_PROVIDER_SITE_OTHER): Payer: Medicare Other | Admitting: Physician Assistant

## 2014-07-14 ENCOUNTER — Encounter (HOSPITAL_COMMUNITY)
Admission: RE | Admit: 2014-07-14 | Discharge: 2014-07-14 | Disposition: A | Discharge status: Home / Self Care | Payer: Self-pay | Source: Ambulatory Visit | Attending: Cardiology | Admitting: Cardiology

## 2014-07-14 VITALS — BP 140/74 | HR 68 | Ht 66.5 in | Wt 194.0 lb

## 2014-07-14 DIAGNOSIS — I2510 Atherosclerotic heart disease of native coronary artery without angina pectoris: Secondary | ICD-10-CM | POA: Diagnosis not present

## 2014-07-14 DIAGNOSIS — Z800 Family history of malignant neoplasm of digestive organs: Secondary | ICD-10-CM | POA: Diagnosis not present

## 2014-07-14 DIAGNOSIS — Z7901 Long term (current) use of anticoagulants: Secondary | ICD-10-CM | POA: Diagnosis not present

## 2014-07-14 DIAGNOSIS — Z1211 Encounter for screening for malignant neoplasm of colon: Secondary | ICD-10-CM

## 2014-07-14 NOTE — Telephone Encounter (Signed)
Mr. Fawbush may stop Eliquis 72 hours prior to endoscopic procedure.  Ashunti Schofield Martinique MD, [facility]

## 2014-07-14 NOTE — Telephone Encounter (Signed)
Patient called Dr.Jordan advised ok to hold Eliquis 72 hrs prior to endoscopic procedure.Message sent to [place].

## 2014-07-14 NOTE — Progress Notes (Signed)
Subjective:    Patient ID: Jacob Curry, male    DOB: [birth year], 73 y.o.   MRN: [id number]  HPI Jacob Curry is a pleasant 73 year old white male referred today for colon neoplasia screening. He is known remotely to Dr. Delfin Edis and had colonoscopy last in August 2002. He had minimal diverticulosis in the left colon and no evidence of polyps. He has family history of colon cancer in a paternal uncle. Patient has history of coronary artery disease with a 100% LAD blockage. He has been managed medically since an MI several years ago and does not have any stents. He has history of paroxysmal atrial fibrillation ,and is currently being maintained on Eliquis. Also with sleep apnea, diabetes mellitus, hypertension, and has an EF of 50-60%. Patient has no current complaints, specifically no changes in his bowel habits abdominal pain, melena or hematochezia. He is followed by Dr. Martinique for cardiology   Review of Systems  Constitutional: Negative.   HENT: Negative.   Eyes: Negative.   Respiratory: Negative.   Cardiovascular: Negative.   Gastrointestinal: Negative.   Endocrine: Negative.   Genitourinary: Negative.   Musculoskeletal: Negative.   Skin: Negative.   Allergic/Immunologic: Negative.   Hematological: Negative.   Psychiatric/Behavioral: Negative.    Outpatient Prescriptions Prior to Visit  Medication Sig Dispense Refill  . amLODipine (NORVASC) 2.5 MG tablet Take 1 tablet (2.5 mg total) by mouth daily. 30 tablet 6  . apixaban (ELIQUIS) 5 MG TABS tablet Take 1 tablet (5 mg total) by mouth 2 (two) times daily. 42 tablet 0  . carvedilol (COREG) 12.5 MG tablet TAKE 1 AND 1/2 TABLETS TWICE DAILY WITH MEALS 270 tablet 2  . fish oil-omega-3 fatty acids 1000 MG capsule Take 2 g by mouth daily.      Marland Kitchen losartan (COZAAR) 100 MG tablet TAKE 1 TABLET BY MOUTH EVERY DAY 90 tablet 2  . metFORMIN (GLUCOPHAGE) 500 MG tablet Take 1 tablet (500 mg total) by mouth daily with breakfast. 90 tablet 1    . Multiple Vitamin (MULTIVITAMIN) tablet Take 1 tablet by mouth daily.    . nitroGLYCERIN (NITROSTAT) 0.4 MG SL tablet Place 1 tablet (0.4 mg total) under the tongue every 5 (five) minutes as needed. 25 tablet prn  . Saw Palmetto, Serenoa repens, (SAW PALMETTO PO) Take 1 tablet by mouth daily.      . simvastatin (ZOCOR) 40 MG tablet TAKE 1 TABLET BY MOUTH AT BEDTIME 90 tablet 2   No facility-administered medications prior to visit.      Allergies  Allergen Reactions  . Acetaminophen     Drug induced hepatitis  . Oxycodone-Acetaminophen     Drug induced hepatitis   Patient Active Problem List   Diagnosis Date Noted  . Atrial fibrillation 07/13/2013  . Pre-diabetes 07/23/2010  . OBSTRUCTIVE SLEEP APNEA 07/19/2010  . PREMATURE VENTRICULAR CONTRACTIONS, FREQUENT 01/31/2010  . 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 Hyperlipidemia 10/20/2008  . HYPERTENSION, BENIGN 10/20/2008  . CAD, NATIVE VESSEL 10/20/2008  . VENTRAL HERNIA 01/08/2008  . ERECTILE DYSFUNCTION 07/23/2007  . ACUTE PROSTATITIS 07/23/2007  . METABOLIC SYNDROME X 78/67/6720   History  Substance Use Topics  . Smoking status: Former Smoker -- 1.00 packs/day    Quit date: 09/02/1990  . Smokeless tobacco: Never Used     Comment: onset age 47-50 up to 1 ppd  . Alcohol Use: No   family history includes Colon cancer in his paternal uncle; Diabetes in his brother and mother; Heart attack in his father. There  is no history of Stroke.  Objective:   Physical Exam   Well-developed older white male in no acute distress, pleasant blood pressure 140/74 pulse 68 height 5 foot 6 weight 194. HEENT; nontraumatic, normocephalic EOMI PERRLA sclerae anicteric, Supple; no JVD, Cardiovascular ;regular rate and rhythm with S1-S2 no murmur rub or gallop, Pulm;clear bilaterally, Abdomen ;soft nontender nondistended bowel sounds are active no mass or  Hepatosplenomegaly Rectal; exam not done, 70s clubbing cyanosis or edema skin warm dry, Psych; mood and  affect appropriate       Assessment & Plan:  #68 73 year old male referred for colon neoplasia screening. Last colonoscopy 2002 negative for polyps. Patient asymptomatic #2 positive family history of colon cancer in a  second degree relative/paternal uncle #3 chronic anticoagulation with Eliquis Number for atrial fibrillation #5 history of coronary artery disease status post remote MIstents # 6 hypertension #7 diabetes mellitus #8 sleep apnea  Plan; patient is scheduled for a colonoscopy with Dr Olevia Perches. Procedure discussed in detail with the patient and he is agreeable to proceed. We will obtain consent from his cardiologist Dr. Martinique for patient to hold Eliquis for 7 days prior to the procedure.

## 2014-07-14 NOTE — Patient Instructions (Signed)
You have been scheduled for a colonoscopy. Please follow written instructions given to you at your visit today.   We have given you a sample colonoscopy prep: Moviprep. If you use inhalers (even only as needed), please bring them with you on the day of your procedure.  We will contact you once we hear from Dr. Shanon Payor regarding the Eliquis medication.

## 2014-07-14 NOTE — Telephone Encounter (Signed)
07/14/2014   RE: Jacob Curry DOB: September 22, 1940 MRN: [id number]   Dear Dr. Peter Martinique,    We have scheduled the above patient for an endoscopic procedure. Our records show that he is on anticoagulation therapy.   Please advise as to how long the patient may come off his therapy of Eliquis prior to the procedure, which is scheduled for 09-14-2014.  Please fax back/ or route the completed form to [place] at [phone].   Sincerely,    Amy Esterwood PA-C

## 2014-07-15 ENCOUNTER — Encounter (HOSPITAL_COMMUNITY)
Admission: RE | Admit: 2014-07-15 | Discharge: 2014-07-15 | Disposition: A | Discharge status: Home / Self Care | Payer: Self-pay | Source: Ambulatory Visit | Attending: Cardiology | Admitting: Cardiology

## 2014-07-15 NOTE — Telephone Encounter (Signed)
Spoke to patient 07/11/14 Dr.Jordan advised to add Amlodipine 2.5 mg daily.Advised to call back if B/P continues to be elevated.

## 2014-07-17 NOTE — Progress Notes (Signed)
Reviewed and agree with screening colonoscopy.EF 55 %

## 2014-07-19 ENCOUNTER — Encounter (HOSPITAL_COMMUNITY): Payer: Self-pay

## 2014-07-20 ENCOUNTER — Telehealth: Payer: Self-pay | Admitting: *Deleted

## 2014-07-20 NOTE — Telephone Encounter (Signed)
I called the patient and left a message for him to stop the Eliquis on 09-11-2014 and resume it on 09-15-2014. I advised if he has any questions to call me.

## 2014-07-21 ENCOUNTER — Encounter (HOSPITAL_COMMUNITY): Payer: Self-pay

## 2014-07-22 ENCOUNTER — Encounter (HOSPITAL_COMMUNITY): Payer: Self-pay

## 2014-07-25 ENCOUNTER — Encounter: Payer: Self-pay | Admitting: Internal Medicine

## 2014-07-26 ENCOUNTER — Encounter (HOSPITAL_COMMUNITY)
Admission: RE | Admit: 2014-07-26 | Discharge: 2014-07-26 | Disposition: A | Discharge status: Home / Self Care | Payer: Self-pay | Source: Ambulatory Visit | Attending: Cardiology | Admitting: Cardiology

## 2014-07-28 ENCOUNTER — Encounter (HOSPITAL_COMMUNITY): Payer: Self-pay

## 2014-08-02 ENCOUNTER — Encounter (HOSPITAL_COMMUNITY)
Admission: RE | Admit: 2014-08-02 | Discharge: 2014-08-02 | Disposition: A | Discharge status: Home / Self Care | Payer: Self-pay | Source: Ambulatory Visit | Attending: Cardiology | Admitting: Cardiology

## 2014-08-02 DIAGNOSIS — I2510 Atherosclerotic heart disease of native coronary artery without angina pectoris: Secondary | ICD-10-CM | POA: Insufficient documentation

## 2014-08-02 DIAGNOSIS — I4891 Unspecified atrial fibrillation: Secondary | ICD-10-CM | POA: Insufficient documentation

## 2014-08-04 ENCOUNTER — Encounter (HOSPITAL_COMMUNITY): Payer: Self-pay

## 2014-08-05 ENCOUNTER — Encounter (HOSPITAL_COMMUNITY): Payer: Self-pay

## 2014-08-05 ENCOUNTER — Telehealth: Payer: Self-pay | Admitting: Cardiology

## 2014-08-05 NOTE — Telephone Encounter (Signed)
Rosemarie Ax called in wanting to know if there are any samples of Eliquis available for the pt. Please call  Thanks

## 2014-08-05 NOTE — Telephone Encounter (Signed)
Left message for pt that samples are at front desk available for pickup.

## 2014-08-08 ENCOUNTER — Ambulatory Visit (INDEPENDENT_AMBULATORY_CARE_PROVIDER_SITE_OTHER): Payer: Medicare Other | Admitting: Family

## 2014-08-08 ENCOUNTER — Encounter: Payer: Self-pay | Admitting: Family

## 2014-08-08 VITALS — BP 132/62 | HR 67 | Temp 97.8°F | Resp 18 | Ht 67.0 in | Wt 199.6 lb

## 2014-08-08 DIAGNOSIS — I2510 Atherosclerotic heart disease of native coronary artery without angina pectoris: Secondary | ICD-10-CM

## 2014-08-08 DIAGNOSIS — R05 Cough: Secondary | ICD-10-CM | POA: Diagnosis not present

## 2014-08-08 DIAGNOSIS — R059 Cough, unspecified: Secondary | ICD-10-CM

## 2014-08-08 MED ORDER — AZITHROMYCIN 250 MG PO TABS: ORAL_TABLET | ORAL | Status: DC

## 2014-08-08 MED ORDER — PREDNISONE 20 MG PO TABS: 20.0000 mg | ORAL_TABLET | Freq: Two times a day (BID) | ORAL | Status: DC

## 2014-08-08 MED ORDER — PROMETHAZINE-DM 6.25-15 MG/5ML PO SYRP: 5.0000 mL | ORAL_SOLUTION | Freq: Four times a day (QID) | ORAL | Status: DC | PRN

## 2014-08-08 MED ORDER — BENZONATATE 100 MG PO CAPS: 100.0000 mg | ORAL_CAPSULE | Freq: Three times a day (TID) | ORAL | Status: DC | PRN

## 2014-08-08 NOTE — Progress Notes (Signed)
   Subjective:    Patient ID: Jacob Curry, male    DOB: [birth year], 73 y.o.   MRN: [id number]  Chief Complaint  Patient presents with  . Cough    x1 productive cough, fever and chills    HPI:  Jacob Curry is a 73 y.o. male who presents today for an acute visit.    Acute symptoms of cough fever and chills started about one week ago. Cough is worst at night. Denies sinus pressure, congestion, body aches, nausea, vomiting, or diarrhea. Has tried Mucinex and Tessalon which provided some relief. Denies anything that makes it worse.   Allergies  Allergen Reactions  . Acetaminophen     Drug induced hepatitis  . Oxycodone-Acetaminophen     Drug induced hepatitis   Current Outpatient Prescriptions on File Prior to Visit  Medication Sig Dispense Refill  . amLODipine (NORVASC) 2.5 MG tablet Take 1 tablet (2.5 mg total) by mouth daily. 30 tablet 6  . apixaban (ELIQUIS) 5 MG TABS tablet Take 1 tablet (5 mg total) by mouth 2 (two) times daily. 42 tablet 0  . carvedilol (COREG) 12.5 MG tablet TAKE 1 AND 1/2 TABLETS TWICE DAILY WITH MEALS 270 tablet 2  . fish oil-omega-3 fatty acids 1000 MG capsule Take 2 g by mouth daily.      Marland Kitchen losartan (COZAAR) 100 MG tablet TAKE 1 TABLET BY MOUTH EVERY DAY 90 tablet 2  . metFORMIN (GLUCOPHAGE) 500 MG tablet Take 1 tablet (500 mg total) by mouth daily with breakfast. 90 tablet 1  . Multiple Vitamin (MULTIVITAMIN) tablet Take 1 tablet by mouth daily.    . nitroGLYCERIN (NITROSTAT) 0.4 MG SL tablet Place 1 tablet (0.4 mg total) under the tongue every 5 (five) minutes as needed. 25 tablet prn  . Saw Palmetto, Serenoa repens, (SAW PALMETTO PO) Take 1 tablet by mouth daily.      . simvastatin (ZOCOR) 40 MG tablet TAKE 1 TABLET BY MOUTH AT BEDTIME 90 tablet 2   No current facility-administered medications on file prior to visit.     Review of Systems    See HPI  Objective:    BP 132/62 mmHg  Pulse 67  Temp(Src) 97.8 F (36.6 C) (Oral)  Resp 18   Ht 5\' 7"  (1.702 m)  Wt 199 lb 9.6 oz (90.538 kg)  BMI 31.25 kg/m2  SpO2 95% Nursing note and vital signs reviewed.  Physical Exam  Constitutional: He is oriented to person, place, and time. He appears well-developed and well-nourished. No distress.  HENT:  Right Ear: Hearing, tympanic membrane, external ear and ear canal normal.  Left Ear: Hearing, tympanic membrane, external ear and ear canal normal.  Nose: Nose normal. Right sinus exhibits no maxillary sinus tenderness and no frontal sinus tenderness. Left sinus exhibits no maxillary sinus tenderness and no frontal sinus tenderness.  Mouth/Throat: Uvula is midline, oropharynx is clear and moist and mucous membranes are normal.  Cardiovascular: Normal rate, regular rhythm, normal heart sounds and intact distal pulses.   Pulmonary/Chest: Effort normal and breath sounds normal.  Lymphadenopathy:    He has no cervical adenopathy.  Neurological: He is alert and oriented to person, place, and time.  Skin: Skin is warm and dry.  Psychiatric: He has a normal mood and affect. His behavior is normal. Judgment and thought content normal.      Assessment & Plan:

## 2014-08-08 NOTE — Progress Notes (Signed)
Pre visit review using our clinic review tool, if applicable. No additional management support is needed unless otherwise documented below in the visit note. 

## 2014-08-08 NOTE — Assessment & Plan Note (Addendum)
Symptoms and exam consistent with bronchitis. Start azithromycin given cardiac history. Start prednisone for airway inflammation. For coughing start Tessalon Perles and promethazine DM. Follow up if symptoms worsen or fail to improve.

## 2014-08-08 NOTE — Patient Instructions (Addendum)
Thank you for choosing [facility].  Summary/Instructions:  Your prescription(s) have been submitted to your pharmacy. Please take as directed and contact our office if you believe you are having problem(s) with the medication(s).  If your symptoms worsen or fail to improve, please contact our office for further instruction, or in case of emergency go directly to the emergency room at the closest medical facility.    Acute Bronchitis Bronchitis is inflammation of the airways that extend from the windpipe into the lungs (bronchi). The inflammation often causes mucus to develop. This leads to a cough, which is the most common symptom of bronchitis.  In acute bronchitis, the condition usually develops suddenly and goes away over time, usually in a couple weeks. Smoking, allergies, and asthma can make bronchitis worse. Repeated episodes of bronchitis may cause further lung problems.  CAUSES Acute bronchitis is most often caused by the same virus that causes a cold. The virus can spread from person to person (contagious) through coughing, sneezing, and touching contaminated objects. SIGNS AND SYMPTOMS   Cough.   Fever.   Coughing up mucus.   Body aches.   Chest congestion.   Chills.   Shortness of breath.   Sore throat.  DIAGNOSIS  Acute bronchitis is usually diagnosed through a physical exam. Your health care provider will also ask you questions about your medical history. Tests, such as chest X-rays, are sometimes done to rule out other conditions.  TREATMENT  Acute bronchitis usually goes away in a couple weeks. Oftentimes, no medical treatment is necessary. Medicines are sometimes given for relief of fever or cough. Antibiotic medicines are usually not needed but may be prescribed in certain situations. In some cases, an inhaler may be recommended to help reduce shortness of breath and control the cough. A cool mist vaporizer may also be used to help thin bronchial  secretions and make it easier to clear the chest.  HOME CARE INSTRUCTIONS  Get plenty of rest.   Drink enough fluids to keep your urine clear or pale yellow (unless you have a medical condition that requires fluid restriction). Increasing fluids may help thin your respiratory secretions (sputum) and reduce chest congestion, and it will prevent dehydration.   Take medicines only as directed by your health care provider.  If you were prescribed an antibiotic medicine, finish it all even if you start to feel better.  Avoid smoking and secondhand smoke. Exposure to cigarette smoke or irritating chemicals will make bronchitis worse. If you are a smoker, consider using nicotine gum or skin patches to help control withdrawal symptoms. Quitting smoking will help your lungs heal faster.   Reduce the chances of another bout of acute bronchitis by washing your hands frequently, avoiding people with cold symptoms, and trying not to touch your hands to your mouth, nose, or eyes.   Keep all follow-up visits as directed by your health care provider.  SEEK MEDICAL CARE IF: Your symptoms do not improve after 1 week of treatment.  SEEK IMMEDIATE MEDICAL CARE IF:  You develop an increased fever or chills.   You have chest pain.   You have severe shortness of breath.  You have bloody sputum.   You develop dehydration.  You faint or repeatedly feel like you are going to pass out.  You develop repeated vomiting.  You develop a severe headache. MAKE SURE YOU:   Understand these instructions.  Will watch your condition.  Will get help right away if you are not doing well or   get worse. Document Released: 09/26/2004 Document Revised: 01/03/2014 Document Reviewed: 02/09/2013 ExitCare Patient Information 2015 [employer]. This information is not intended to replace advice given to you by your health care provider. Make sure you discuss any questions you have with your health care  provider.  

## 2014-08-09 ENCOUNTER — Encounter (HOSPITAL_COMMUNITY): Payer: Self-pay

## 2014-08-09 ENCOUNTER — Telehealth: Payer: Self-pay | Admitting: *Deleted

## 2014-08-09 NOTE — Telephone Encounter (Signed)
I called and spoke to the patient. I reminded him I had called him on 07-14-2014 and left the Eliquis instructions for the colonoscopy scheduled for 09-14-2014.  He was to stop the Eliquis on 09-11-2014 and resume it on 09-15-2014.  He said he had gotten my message and thanked me for reminding him.

## 2014-08-11 ENCOUNTER — Encounter (HOSPITAL_COMMUNITY): Payer: Self-pay

## 2014-08-12 ENCOUNTER — Encounter (HOSPITAL_COMMUNITY)
Admission: RE | Admit: 2014-08-12 | Discharge: 2014-08-12 | Disposition: A | Discharge status: Home / Self Care | Payer: Self-pay | Source: Ambulatory Visit | Attending: Cardiology | Admitting: Cardiology

## 2014-08-16 ENCOUNTER — Encounter (HOSPITAL_COMMUNITY): Payer: Self-pay

## 2014-08-18 ENCOUNTER — Encounter (HOSPITAL_COMMUNITY)
Admission: RE | Admit: 2014-08-18 | Discharge: 2014-08-18 | Disposition: A | Discharge status: Home / Self Care | Payer: Self-pay | Source: Ambulatory Visit | Attending: Cardiology | Admitting: Cardiology

## 2014-08-19 ENCOUNTER — Encounter (HOSPITAL_COMMUNITY)
Admission: RE | Admit: 2014-08-19 | Discharge: 2014-08-19 | Disposition: A | Discharge status: Home / Self Care | Payer: Self-pay | Source: Ambulatory Visit | Attending: Cardiology | Admitting: Cardiology

## 2014-08-23 ENCOUNTER — Encounter (HOSPITAL_COMMUNITY): Payer: Self-pay

## 2014-08-24 ENCOUNTER — Encounter (HOSPITAL_COMMUNITY)
Admission: RE | Admit: 2014-08-24 | Discharge: 2014-08-24 | Disposition: A | Discharge status: Home / Self Care | Payer: Self-pay | Source: Ambulatory Visit | Attending: Cardiology | Admitting: Cardiology

## 2014-08-25 ENCOUNTER — Encounter (HOSPITAL_COMMUNITY)
Admission: RE | Admit: 2014-08-25 | Discharge: 2014-08-25 | Disposition: A | Discharge status: Home / Self Care | Payer: Self-pay | Source: Ambulatory Visit | Attending: Cardiology | Admitting: Cardiology

## 2014-08-30 ENCOUNTER — Encounter (HOSPITAL_COMMUNITY)
Admission: RE | Admit: 2014-08-30 | Discharge: 2014-08-30 | Disposition: A | Discharge status: Home / Self Care | Payer: Self-pay | Source: Ambulatory Visit | Attending: Cardiology | Admitting: Cardiology

## 2014-09-01 ENCOUNTER — Encounter (HOSPITAL_COMMUNITY)
Admission: RE | Admit: 2014-09-01 | Discharge: 2014-09-01 | Disposition: A | Discharge status: Home / Self Care | Payer: Self-pay | Source: Ambulatory Visit | Attending: Cardiology | Admitting: Cardiology

## 2014-09-06 ENCOUNTER — Encounter (HOSPITAL_COMMUNITY)
Admission: RE | Admit: 2014-09-06 | Discharge: 2014-09-06 | Disposition: A | Discharge status: Home / Self Care | Payer: Self-pay | Source: Ambulatory Visit | Attending: Cardiology | Admitting: Cardiology

## 2014-09-06 DIAGNOSIS — I2510 Atherosclerotic heart disease of native coronary artery without angina pectoris: Secondary | ICD-10-CM | POA: Insufficient documentation

## 2014-09-06 DIAGNOSIS — I4891 Unspecified atrial fibrillation: Secondary | ICD-10-CM | POA: Insufficient documentation

## 2014-09-08 ENCOUNTER — Encounter (HOSPITAL_COMMUNITY)
Admission: RE | Admit: 2014-09-08 | Discharge: 2014-09-08 | Disposition: A | Discharge status: Home / Self Care | Payer: Self-pay | Source: Ambulatory Visit | Attending: Cardiology | Admitting: Cardiology

## 2014-09-09 ENCOUNTER — Encounter (HOSPITAL_COMMUNITY)
Admission: RE | Admit: 2014-09-09 | Discharge: 2014-09-09 | Disposition: A | Discharge status: Home / Self Care | Payer: Self-pay | Source: Ambulatory Visit | Attending: Cardiology | Admitting: Cardiology

## 2014-09-09 ENCOUNTER — Other Ambulatory Visit: Payer: Self-pay | Admitting: *Deleted

## 2014-09-09 MED ORDER — APIXABAN 5 MG PO TABS: 5.0000 mg | ORAL_TABLET | Freq: Two times a day (BID) | ORAL | Status: DC

## 2014-09-13 ENCOUNTER — Encounter (HOSPITAL_COMMUNITY)
Admission: RE | Admit: 2014-09-13 | Discharge: 2014-09-13 | Disposition: A | Discharge status: Home / Self Care | Payer: Self-pay | Source: Ambulatory Visit | Attending: Cardiology | Admitting: Cardiology

## 2014-09-14 ENCOUNTER — Encounter: Payer: Self-pay | Admitting: Internal Medicine

## 2014-09-14 ENCOUNTER — Ambulatory Visit (AMBULATORY_SURGERY_CENTER): Payer: Medicare Other | Admitting: Internal Medicine

## 2014-09-14 VITALS — BP 145/81 | HR 59 | Temp 97.1°F | Resp 12 | Ht 66.5 in | Wt 194.0 lb

## 2014-09-14 DIAGNOSIS — I10 Essential (primary) hypertension: Secondary | ICD-10-CM | POA: Diagnosis not present

## 2014-09-14 DIAGNOSIS — K621 Rectal polyp: Secondary | ICD-10-CM

## 2014-09-14 DIAGNOSIS — Z1211 Encounter for screening for malignant neoplasm of colon: Secondary | ICD-10-CM | POA: Diagnosis not present

## 2014-09-14 DIAGNOSIS — D128 Benign neoplasm of rectum: Secondary | ICD-10-CM

## 2014-09-14 DIAGNOSIS — D129 Benign neoplasm of anus and anal canal: Principal | ICD-10-CM

## 2014-09-14 DIAGNOSIS — I4891 Unspecified atrial fibrillation: Secondary | ICD-10-CM | POA: Diagnosis not present

## 2014-09-14 DIAGNOSIS — G4733 Obstructive sleep apnea (adult) (pediatric): Secondary | ICD-10-CM | POA: Diagnosis not present

## 2014-09-14 LAB — GLUCOSE, CAPILLARY
Glucose-Capillary: 91 mg/dL (ref 70–99)
Glucose-Capillary: 89 mg/dL (ref 70–99)

## 2014-09-14 NOTE — Op Note (Addendum)
[facility]  [address] [place], [address]   COLONOSCOPY PROCEDURE REPORT  PATIENT: Jacob Curry, Jacob Curry  MR#: [id number] BIRTHDATE: 01/31/1941 , 21  yrs. old GENDER: male ENDOSCOPIST: Lafayette Dragon, MD , Dr Peter Martinique REFERRED RC:BULAGTX Linna Darner, M.D. PROCEDURE DATE:  09/14/2014 PROCEDURE:   Colonoscopy with snare polypectomy and Colonoscopy with cold biopsy polypectomy First Screening Colonoscopy - Avg.  risk and is 50 yrs.  old or older - No.  Prior Negative Screening - Now for repeat screening. 10 or more years since last screening  History of Adenoma - Now for follow-up colonoscopy & has been > or = to 3 yrs.  N/A  Polyps Removed Today? Yes. ASA CLASS:   Class III INDICATIONS:average risk for colon cancer and prior colonoscopy in February 2002 showed diverticulosis.  Positive family history of colon cancer in paternal uncle, Eliquis on hold. MEDICATIONS: Monitored anesthesia care and Propofol 270 mg IV  DESCRIPTION OF PROCEDURE:   After the risks benefits and alternatives of the procedure were thoroughly explained, informed consent was obtained.  The digital rectal exam revealed no abnormalities of the rectum.   The LB [id number]  endoscope was introduced through the anus and advanced to the cecum, which was identified by both the appendix and ileocecal valve. No adverse events experienced.   The quality of the prep was good, using MoviPrep  The instrument was then slowly withdrawn as the colon was fully examined.      COLON FINDINGS: Three sessile polyps measuring 10 mm in size were found in the rectum.  A polypectomy was performed with a cold snare.  The resection was complete, the polyp tissue was completely retrieved and sent to histology.  A polypectomy was performed with cold forceps.  The resection was complete, the polyp tissue was completely retrieved and sent to histology.   There was mild diverticulosis noted throughout the entire  examined colon with associated colonic narrowing, tortuosity and angulation. Retroflexed views revealed no abnormalities. The time to cecum=5 minutes 55 seconds.  Withdrawal time=10 minutes 34 seconds.  The scope was withdrawn and the procedure completed. COMPLICATIONS: There were no immediate complications.  ENDOSCOPIC IMPRESSION: 1.   Three sessile polyps were found in the rectum; polypectomy was performed with a cold snare; polypectomy was performed with cold forceps 2.   There was mild diverticulosis noted throughout the entire examined colon 3. lipoma of the sigmoid colon RECOMMENDATIONS: 1.  Await pathology results 2.  High fiber diet 3 .hold Eliquis x 10 days Recall colonoscopy pending path report  eSigned:  Lafayette Dragon, MD 09/14/2014 11:28 AM Revised: 09/14/2014 11:28 AM  cc:   PATIENT NAME:  Jacob, Curry MR#: [id number]

## 2014-09-14 NOTE — Progress Notes (Signed)
Stable to RR 

## 2014-09-14 NOTE — Patient Instructions (Addendum)

## 2014-09-14 NOTE — Progress Notes (Signed)
Called to room to assist during endoscopic procedure.  Patient ID and intended procedure confirmed with present staff. Received instructions for my participation in the procedure from the performing physician.  

## 2014-09-15 ENCOUNTER — Telehealth: Payer: Self-pay

## 2014-09-15 ENCOUNTER — Encounter (HOSPITAL_COMMUNITY): Payer: Self-pay

## 2014-09-15 NOTE — Telephone Encounter (Signed)
  Follow up Call-  Call back number 09/14/2014  Post procedure Call Back phone  # [phone]  Permission to leave phone message Yes     Patient questions:  Do you have a fever, pain , or abdominal swelling? No. Pain Score  0 *  Have you tolerated food without any problems? Yes.    Have you been able to return to your normal activities? Yes.    Do you have any questions about your discharge instructions: Diet   No. Medications  No. Follow up visit  No.  Do you have questions or concerns about your Care? No.  Actions: * If pain score is 4 or above: No action needed, pain <4.

## 2014-09-16 ENCOUNTER — Encounter (HOSPITAL_COMMUNITY)
Admission: RE | Admit: 2014-09-16 | Discharge: 2014-09-16 | Disposition: A | Discharge status: Home / Self Care | Payer: Self-pay | Source: Ambulatory Visit | Attending: Cardiology | Admitting: Cardiology

## 2014-09-19 ENCOUNTER — Encounter: Payer: Self-pay | Admitting: Internal Medicine

## 2014-09-19 ENCOUNTER — Telehealth: Payer: Self-pay | Admitting: Cardiology

## 2014-09-19 NOTE — Telephone Encounter (Signed)
Yes it is OK.  Peter Martinique MD, [facility]

## 2014-09-19 NOTE — Telephone Encounter (Signed)
Please call,need to know what to do about his Eliquis. Her had stopped it for a procedure.

## 2014-09-19 NOTE — Telephone Encounter (Signed)
Returned call to patient Dr.Jordan advised ok to hold eliquis for 10 days.

## 2014-09-19 NOTE — Telephone Encounter (Signed)
Returned call to patient he stated he had colonoscopy with 3 polyps removed last week.Dr.Brodie advised him to stay off eliquis for 10 days.He wanted to make sure ok with Dr.Jordan.Message sent to [place].

## 2014-09-20 ENCOUNTER — Encounter (HOSPITAL_COMMUNITY)
Admission: RE | Admit: 2014-09-20 | Discharge: 2014-09-20 | Disposition: A | Discharge status: Home / Self Care | Payer: Self-pay | Source: Ambulatory Visit | Attending: Cardiology | Admitting: Cardiology

## 2014-09-22 ENCOUNTER — Encounter (HOSPITAL_COMMUNITY)
Admission: RE | Admit: 2014-09-22 | Discharge: 2014-09-22 | Disposition: A | Discharge status: Home / Self Care | Payer: Self-pay | Source: Ambulatory Visit | Attending: Cardiology | Admitting: Cardiology

## 2014-09-23 ENCOUNTER — Encounter (HOSPITAL_COMMUNITY): Payer: Self-pay

## 2014-09-27 ENCOUNTER — Encounter (HOSPITAL_COMMUNITY)
Admission: RE | Admit: 2014-09-27 | Discharge: 2014-09-27 | Disposition: A | Discharge status: Home / Self Care | Payer: Self-pay | Source: Ambulatory Visit | Attending: Cardiology | Admitting: Cardiology

## 2014-09-29 ENCOUNTER — Telehealth: Payer: Self-pay | Admitting: Internal Medicine

## 2014-09-29 ENCOUNTER — Encounter (HOSPITAL_COMMUNITY)
Admission: RE | Admit: 2014-09-29 | Discharge: 2014-09-29 | Disposition: A | Discharge status: Home / Self Care | Payer: Self-pay | Source: Ambulatory Visit | Attending: Cardiology | Admitting: Cardiology

## 2014-09-30 ENCOUNTER — Encounter (HOSPITAL_COMMUNITY): Payer: Self-pay

## 2014-09-30 DIAGNOSIS — H524 Presbyopia: Secondary | ICD-10-CM | POA: Diagnosis not present

## 2014-09-30 DIAGNOSIS — Z961 Presence of intraocular lens: Secondary | ICD-10-CM | POA: Diagnosis not present

## 2014-09-30 NOTE — Telephone Encounter (Signed)
I spoke with the patient and reviewed the pathology results. All questions answered.  He did not get the letter in the mail.  I have mailed another copy.  He will call back for any additional questions or concerns

## 2014-10-06 ENCOUNTER — Encounter (HOSPITAL_COMMUNITY)
Admission: RE | Admit: 2014-10-06 | Discharge: 2014-10-06 | Disposition: A | Discharge status: Home / Self Care | Payer: Self-pay | Source: Ambulatory Visit | Attending: Cardiology | Admitting: Cardiology

## 2014-10-06 DIAGNOSIS — I2510 Atherosclerotic heart disease of native coronary artery without angina pectoris: Secondary | ICD-10-CM | POA: Insufficient documentation

## 2014-10-06 DIAGNOSIS — I4891 Unspecified atrial fibrillation: Secondary | ICD-10-CM | POA: Insufficient documentation

## 2014-10-07 ENCOUNTER — Encounter (HOSPITAL_COMMUNITY)
Admission: RE | Admit: 2014-10-07 | Discharge: 2014-10-07 | Disposition: A | Discharge status: Home / Self Care | Payer: Self-pay | Source: Ambulatory Visit | Attending: Cardiology | Admitting: Cardiology

## 2014-10-11 ENCOUNTER — Encounter (HOSPITAL_COMMUNITY)
Admission: RE | Admit: 2014-10-11 | Discharge: 2014-10-11 | Disposition: A | Discharge status: Home / Self Care | Payer: Self-pay | Source: Ambulatory Visit | Attending: Cardiology | Admitting: Cardiology

## 2014-10-13 ENCOUNTER — Encounter (HOSPITAL_COMMUNITY)
Admission: RE | Admit: 2014-10-13 | Discharge: 2014-10-13 | Disposition: A | Discharge status: Home / Self Care | Payer: Self-pay | Source: Ambulatory Visit | Attending: Cardiology | Admitting: Cardiology

## 2014-10-14 ENCOUNTER — Encounter (HOSPITAL_COMMUNITY): Admission: RE | Admit: 2014-10-14 | Payer: Self-pay | Source: Ambulatory Visit

## 2014-10-18 ENCOUNTER — Encounter (HOSPITAL_COMMUNITY)
Admission: RE | Admit: 2014-10-18 | Discharge: 2014-10-18 | Disposition: A | Discharge status: Home / Self Care | Payer: Self-pay | Source: Ambulatory Visit | Attending: Cardiology | Admitting: Cardiology

## 2014-10-20 ENCOUNTER — Encounter (HOSPITAL_COMMUNITY)
Admission: RE | Admit: 2014-10-20 | Discharge: 2014-10-20 | Disposition: A | Discharge status: Home / Self Care | Payer: Self-pay | Source: Ambulatory Visit | Attending: Cardiology | Admitting: Cardiology

## 2014-10-21 ENCOUNTER — Encounter (HOSPITAL_COMMUNITY)
Admission: RE | Admit: 2014-10-21 | Discharge: 2014-10-21 | Disposition: A | Discharge status: Home / Self Care | Payer: Self-pay | Source: Ambulatory Visit | Attending: Cardiology | Admitting: Cardiology

## 2014-10-25 ENCOUNTER — Encounter (HOSPITAL_COMMUNITY)
Admission: RE | Admit: 2014-10-25 | Discharge: 2014-10-25 | Disposition: A | Discharge status: Home / Self Care | Payer: Self-pay | Source: Ambulatory Visit | Attending: Cardiology | Admitting: Cardiology

## 2014-10-27 ENCOUNTER — Encounter (HOSPITAL_COMMUNITY)
Admission: RE | Admit: 2014-10-27 | Discharge: 2014-10-27 | Disposition: A | Discharge status: Home / Self Care | Payer: Self-pay | Source: Ambulatory Visit | Attending: Cardiology | Admitting: Cardiology

## 2014-10-28 ENCOUNTER — Encounter (HOSPITAL_COMMUNITY)
Admission: RE | Admit: 2014-10-28 | Discharge: 2014-10-28 | Disposition: A | Discharge status: Home / Self Care | Payer: Self-pay | Source: Ambulatory Visit | Attending: Cardiology | Admitting: Cardiology

## 2014-11-01 ENCOUNTER — Encounter (HOSPITAL_COMMUNITY): Payer: BLUE CROSS/BLUE SHIELD

## 2014-11-01 DIAGNOSIS — I4891 Unspecified atrial fibrillation: Secondary | ICD-10-CM | POA: Insufficient documentation

## 2014-11-01 DIAGNOSIS — I2510 Atherosclerotic heart disease of native coronary artery without angina pectoris: Secondary | ICD-10-CM | POA: Insufficient documentation

## 2014-11-03 ENCOUNTER — Encounter (HOSPITAL_COMMUNITY): Payer: Self-pay

## 2014-11-04 ENCOUNTER — Encounter (HOSPITAL_COMMUNITY): Payer: Self-pay

## 2014-11-07 ENCOUNTER — Other Ambulatory Visit: Payer: Medicare Other

## 2014-11-08 ENCOUNTER — Encounter (HOSPITAL_COMMUNITY): Payer: Self-pay

## 2014-11-10 ENCOUNTER — Ambulatory Visit: Payer: Medicare Other | Admitting: Cardiology

## 2014-11-10 ENCOUNTER — Encounter (HOSPITAL_COMMUNITY)
Admission: RE | Admit: 2014-11-10 | Discharge: 2014-11-10 | Disposition: A | Discharge status: Home / Self Care | Payer: Self-pay | Source: Ambulatory Visit | Attending: Cardiology | Admitting: Cardiology

## 2014-11-11 ENCOUNTER — Encounter (HOSPITAL_COMMUNITY)
Admission: RE | Admit: 2014-11-11 | Discharge: 2014-11-11 | Disposition: A | Discharge status: Home / Self Care | Payer: Self-pay | Source: Ambulatory Visit | Attending: Cardiology | Admitting: Cardiology

## 2014-11-14 ENCOUNTER — Other Ambulatory Visit: Payer: Medicare Other

## 2014-11-15 ENCOUNTER — Encounter (HOSPITAL_COMMUNITY)
Admission: RE | Admit: 2014-11-15 | Discharge: 2014-11-15 | Disposition: A | Discharge status: Home / Self Care | Payer: Self-pay | Source: Ambulatory Visit | Attending: Cardiology | Admitting: Cardiology

## 2014-11-17 ENCOUNTER — Encounter (HOSPITAL_COMMUNITY): Payer: Self-pay

## 2014-11-18 ENCOUNTER — Encounter (HOSPITAL_COMMUNITY): Payer: Self-pay

## 2014-11-22 ENCOUNTER — Encounter (HOSPITAL_COMMUNITY): Payer: Self-pay

## 2014-11-22 ENCOUNTER — Ambulatory Visit: Payer: Medicare Other | Admitting: Cardiology

## 2014-11-24 ENCOUNTER — Encounter (HOSPITAL_COMMUNITY)
Admission: RE | Admit: 2014-11-24 | Discharge: 2014-11-24 | Disposition: A | Discharge status: Home / Self Care | Payer: Self-pay | Source: Ambulatory Visit | Attending: Cardiology | Admitting: Cardiology

## 2014-11-25 ENCOUNTER — Encounter (HOSPITAL_COMMUNITY): Payer: Self-pay

## 2014-11-29 ENCOUNTER — Encounter (HOSPITAL_COMMUNITY)
Admission: RE | Admit: 2014-11-29 | Discharge: 2014-11-29 | Disposition: A | Discharge status: Home / Self Care | Payer: Self-pay | Source: Ambulatory Visit | Attending: Cardiology | Admitting: Cardiology

## 2014-12-01 ENCOUNTER — Encounter (HOSPITAL_COMMUNITY)
Admission: RE | Admit: 2014-12-01 | Discharge: 2014-12-01 | Disposition: A | Discharge status: Home / Self Care | Payer: Self-pay | Source: Ambulatory Visit | Attending: Cardiology | Admitting: Cardiology

## 2014-12-02 ENCOUNTER — Encounter (HOSPITAL_COMMUNITY)
Admission: RE | Admit: 2014-12-02 | Discharge: 2014-12-02 | Disposition: A | Discharge status: Home / Self Care | Payer: Self-pay | Source: Ambulatory Visit | Attending: Cardiology | Admitting: Cardiology

## 2014-12-02 DIAGNOSIS — I4891 Unspecified atrial fibrillation: Secondary | ICD-10-CM | POA: Insufficient documentation

## 2014-12-02 DIAGNOSIS — I2510 Atherosclerotic heart disease of native coronary artery without angina pectoris: Secondary | ICD-10-CM | POA: Insufficient documentation

## 2014-12-06 ENCOUNTER — Encounter (HOSPITAL_COMMUNITY)
Admission: RE | Admit: 2014-12-06 | Discharge: 2014-12-06 | Disposition: A | Discharge status: Home / Self Care | Payer: Self-pay | Source: Ambulatory Visit | Attending: Cardiology | Admitting: Cardiology

## 2014-12-08 ENCOUNTER — Encounter (HOSPITAL_COMMUNITY)
Admission: RE | Admit: 2014-12-08 | Discharge: 2014-12-08 | Disposition: A | Discharge status: Home / Self Care | Payer: Self-pay | Source: Ambulatory Visit | Attending: Cardiology | Admitting: Cardiology

## 2014-12-09 ENCOUNTER — Encounter (HOSPITAL_COMMUNITY)
Admission: RE | Admit: 2014-12-09 | Discharge: 2014-12-09 | Disposition: A | Discharge status: Home / Self Care | Payer: Self-pay | Source: Ambulatory Visit | Attending: Cardiology | Admitting: Cardiology

## 2014-12-13 ENCOUNTER — Encounter (HOSPITAL_COMMUNITY)
Admission: RE | Admit: 2014-12-13 | Discharge: 2014-12-13 | Disposition: A | Discharge status: Home / Self Care | Payer: Self-pay | Source: Ambulatory Visit | Attending: Cardiology | Admitting: Cardiology

## 2014-12-15 ENCOUNTER — Encounter (HOSPITAL_COMMUNITY)
Admission: RE | Admit: 2014-12-15 | Discharge: 2014-12-15 | Disposition: A | Discharge status: Home / Self Care | Payer: Self-pay | Source: Ambulatory Visit | Attending: Cardiology | Admitting: Cardiology

## 2014-12-16 ENCOUNTER — Encounter (HOSPITAL_COMMUNITY)
Admission: RE | Admit: 2014-12-16 | Discharge: 2014-12-16 | Disposition: A | Discharge status: Home / Self Care | Payer: Self-pay | Source: Ambulatory Visit | Attending: Cardiology | Admitting: Cardiology

## 2014-12-19 DIAGNOSIS — L820 Inflamed seborrheic keratosis: Secondary | ICD-10-CM | POA: Diagnosis not present

## 2014-12-19 DIAGNOSIS — L821 Other seborrheic keratosis: Secondary | ICD-10-CM | POA: Diagnosis not present

## 2014-12-19 DIAGNOSIS — L814 Other melanin hyperpigmentation: Secondary | ICD-10-CM | POA: Diagnosis not present

## 2014-12-20 ENCOUNTER — Encounter (HOSPITAL_COMMUNITY)
Admission: RE | Admit: 2014-12-20 | Discharge: 2014-12-20 | Disposition: A | Discharge status: Home / Self Care | Payer: Self-pay | Source: Ambulatory Visit | Attending: Cardiology | Admitting: Cardiology

## 2014-12-21 ENCOUNTER — Other Ambulatory Visit (INDEPENDENT_AMBULATORY_CARE_PROVIDER_SITE_OTHER): Payer: Medicare Other | Admitting: *Deleted

## 2014-12-21 DIAGNOSIS — E119 Type 2 diabetes mellitus without complications: Secondary | ICD-10-CM | POA: Diagnosis not present

## 2014-12-21 DIAGNOSIS — I25119 Atherosclerotic heart disease of native coronary artery with unspecified angina pectoris: Secondary | ICD-10-CM

## 2014-12-21 DIAGNOSIS — I480 Paroxysmal atrial fibrillation: Secondary | ICD-10-CM | POA: Diagnosis not present

## 2014-12-21 DIAGNOSIS — E785 Hyperlipidemia, unspecified: Secondary | ICD-10-CM

## 2014-12-21 LAB — LIPID PANEL
Cholesterol: 97 mg/dL (ref 0–200)
HDL: 42.8 mg/dL (ref >39.00)
LDL Cholesterol: 34 mg/dL (ref 0–99)
NonHDL: 54.2
Total CHOL/HDL Ratio: 2
Triglycerides: 101 mg/dL (ref 0.0–149.0)
VLDL: 20.2 mg/dL (ref 0.0–40.0)

## 2014-12-21 LAB — BASIC METABOLIC PANEL
BUN: 16 mg/dL (ref 6–23)
CO2: 26 mEq/L (ref 19–32)
Calcium: 9.2 mg/dL (ref 8.4–10.5)
Chloride: 111 mEq/L (ref 96–112)
Creatinine, Ser: 1.03 mg/dL (ref 0.40–1.50)
GFR: 75.04 mL/min (ref >60.00)
Glucose, Bld: 122 mg/dL — ABNORMAL HIGH (ref 70–99)
Potassium: 4.1 mEq/L (ref 3.5–5.1)
Sodium: 141 mEq/L (ref 135–145)

## 2014-12-21 LAB — HEMOGLOBIN A1C: Hgb A1c MFr Bld: 6.4 % (ref 4.6–6.5)

## 2014-12-21 LAB — HEPATIC FUNCTION PANEL
ALT: 21 U/L (ref 0–53)
AST: 22 U/L (ref 0–37)
Albumin: 4.2 g/dL (ref 3.5–5.2)
Alkaline Phosphatase: 70 U/L (ref 39–117)
Bilirubin, Direct: 0.2 mg/dL (ref 0.0–0.3)
Total Bilirubin: 0.7 mg/dL (ref 0.2–1.2)
Total Protein: 6.7 g/dL (ref 6.0–8.3)

## 2014-12-21 NOTE — Addendum Note (Signed)
Addended by: Eulis Foster on: 12/21/2014 09:17 AM   Modules accepted: Orders

## 2014-12-22 ENCOUNTER — Encounter (HOSPITAL_COMMUNITY)
Admission: RE | Admit: 2014-12-22 | Discharge: 2014-12-22 | Disposition: A | Discharge status: Home / Self Care | Payer: Self-pay | Source: Ambulatory Visit | Attending: Cardiology | Admitting: Cardiology

## 2014-12-23 ENCOUNTER — Encounter (HOSPITAL_COMMUNITY)
Admission: RE | Admit: 2014-12-23 | Discharge: 2014-12-23 | Disposition: A | Discharge status: Home / Self Care | Payer: Self-pay | Source: Ambulatory Visit | Attending: Cardiology | Admitting: Cardiology

## 2014-12-27 ENCOUNTER — Encounter (HOSPITAL_COMMUNITY)
Admission: RE | Admit: 2014-12-27 | Discharge: 2014-12-27 | Disposition: A | Discharge status: Home / Self Care | Payer: Self-pay | Source: Ambulatory Visit | Attending: Cardiology | Admitting: Cardiology

## 2014-12-28 ENCOUNTER — Ambulatory Visit (INDEPENDENT_AMBULATORY_CARE_PROVIDER_SITE_OTHER): Payer: Medicare Other | Admitting: Cardiology

## 2014-12-28 ENCOUNTER — Encounter: Payer: Self-pay | Admitting: Cardiology

## 2014-12-28 VITALS — BP 110/70 | HR 60 | Ht 67.0 in | Wt 194.2 lb

## 2014-12-28 DIAGNOSIS — E785 Hyperlipidemia, unspecified: Secondary | ICD-10-CM

## 2014-12-28 DIAGNOSIS — I480 Paroxysmal atrial fibrillation: Secondary | ICD-10-CM | POA: Diagnosis not present

## 2014-12-28 DIAGNOSIS — I25119 Atherosclerotic heart disease of native coronary artery with unspecified angina pectoris: Secondary | ICD-10-CM

## 2014-12-28 DIAGNOSIS — I2510 Atherosclerotic heart disease of native coronary artery without angina pectoris: Secondary | ICD-10-CM

## 2014-12-28 DIAGNOSIS — I10 Essential (primary) hypertension: Secondary | ICD-10-CM | POA: Diagnosis not present

## 2014-12-28 NOTE — Patient Instructions (Signed)
Continue your current therapy  We will see you in 6 months with Ecg

## 2014-12-28 NOTE — Progress Notes (Signed)
[name] Date of Birth: Mar 23, 1941 Medical Record #[id number]  History of Present Illness: Jacob Curry is seen as a work in today for follow up. He has a known history of coronary disease with cardiac catheterization in November 2007 showing total occlusion of a large left circumflex vessel with good left to left collaterals. There was 40% disease in the left main and origin of LAD. 90% mid diagonal, 80-90% mid RCA and 90% RV marginal branch.  Ejection fraction was 50-55%. Prior to this he had a nuclear stress test- he was able to exercise 8:30 with severe ischemia of the inferolateral wall. He has been managed medically. Repeat cardiac cath in May 2011 showed no significant change. He has a history of hypertension, hyperlipidemia, Pafib,  diabetes. He has a history of moderate obstructive sleep apnea. This is managed with an oral appliance. He was unable to tolerate CPAP therapy.  He is anticoagulated with Eliquis. He denies any palpitations, SOB, or chest pain. Continues to exercise regularly with Cardiac Rehab. His wife recently had an MI.    Current Outpatient Prescriptions on File Prior to Visit  Medication Sig Dispense Refill  . amLODipine (NORVASC) 2.5 MG tablet Take 1 tablet (2.5 mg total) by mouth daily. 30 tablet 6  . apixaban (ELIQUIS) 5 MG TABS tablet Take 1 tablet (5 mg total) by mouth 2 (two) times daily. 60 tablet 5  . carvedilol (COREG) 12.5 MG tablet TAKE 1 AND 1/2 TABLETS TWICE DAILY WITH MEALS 270 tablet 2  . fish oil-omega-3 fatty acids 1000 MG capsule Take 2 g by mouth daily.      Marland Kitchen losartan (COZAAR) 100 MG tablet TAKE 1 TABLET BY MOUTH EVERY DAY 90 tablet 2  . metFORMIN (GLUCOPHAGE) 500 MG tablet Take 1 tablet (500 mg total) by mouth daily with breakfast. 90 tablet 1  . Multiple Vitamin (MULTIVITAMIN) tablet Take 1 tablet by mouth daily.    . nitroGLYCERIN (NITROSTAT) 0.4 MG SL tablet Place 1 tablet (0.4 mg total) under the tongue every 5 (five) minutes as needed. (Patient  not taking: Reported on 09/14/2014) 25 tablet prn  . Saw Palmetto, Serenoa repens, (SAW PALMETTO PO) Take 1 tablet by mouth daily.      . simvastatin (ZOCOR) 40 MG tablet TAKE 1 TABLET BY MOUTH AT BEDTIME 90 tablet 2   No current facility-administered medications on file prior to visit.    Allergies  Allergen Reactions  . Acetaminophen     Drug induced hepatitis  . Oxycodone-Acetaminophen     Drug induced hepatitis    Past Medical History  Diagnosis Date  . CAD (coronary artery disease)     Dr Nickey Kloepfer Martinique  . HTN (hypertension)   . HLD (hyperlipidemia)   . DM (diabetes mellitus)   . ED (erectile dysfunction)   . Carotid stenosis   . Obesity   . OSA (obstructive sleep apnea)     oral appliance, Dr Ron Parker  . Paroxysmal atrial fibrillation     Past Surgical History  Procedure Laterality Date  . Appendectomy  1958  . Ankle fracture surgery  1993  . Hand surgery  1965  . Tonsillectomy    . Cardiac catheterization  2007 & 2011  . Colonoscopy  2002    negative; Dr Olevia Perches    History  Smoking status  . Former Smoker -- 1.00 packs/day  . Quit date: 09/02/1990  Smokeless tobacco  . Never Used    Comment: onset age 64-50 up to 1 ppd  History  Alcohol Use No    Family History  Problem Relation Age of Onset  . Diabetes Mother   . Colon cancer Paternal Uncle   . Heart attack Father      4 vessel CBAG @ 27  . Diabetes Brother   . Stroke Neg Hx     Review of Systems: As noted in history of present illness.  All other systems were reviewed and are negative.  Physical Exam: There were no vitals taken for this visit. He is a pleasant white male in no acute distress. HEENT exam is unremarkable. He has no jugular venous distention or bruits. There is no adenopathy or thyromegaly. Cardiac exam reveals a regular rate and rhythm. There is no  gallop, murmur, or click. PMI is normal. Lungs are clear. Abdomen is soft and nontender without masses or bruits. He has normal bowel  sounds. Extremities are without cyanosis or edema. Pedal pulses are 2+ and symmetric. He is alert and oriented x3. Cranial nerves II through XII are intact. He has no focal findings.  LABORATORY DATA: Lab Results  Component Value Date   WBC 8.6 11/15/2013   HGB 14.4 11/15/2013   HCT 43.4 11/15/2013   PLT 215.0 11/15/2013   GLUCOSE 122* 12/21/2014   CHOL 97 12/21/2014   TRIG 101.0 12/21/2014   HDL 42.80 12/21/2014   LDLDIRECT 39.9 07/22/2008   LDLCALC 34 12/21/2014   ALT 21 12/21/2014   AST 22 12/21/2014   NA 141 12/21/2014   K 4.1 12/21/2014   CL 111 12/21/2014   CREATININE 1.03 12/21/2014   BUN 16 12/21/2014   CO2 26 12/21/2014   TSH 2.27 03/09/2012   PSA 1.70 01/04/2009   INR 1.1 ratio* 01/15/2010   HGBA1C 6.4 12/21/2014   MICROALBUR 0.4 03/09/2012    Assessment / Plan: 1. 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 Moderate to severe 3 vessel disease. Patient is asymptomatic on medical therapy. Cardiac cath in 2011 unchanged. I don't think stress test will be helpful since it will be clearly abnormal. Will continue medical therapy and monitor for any new symptoms. He is on optimal medical therapy.  2. Hypertension, controlled.  3. Hyperlipidemia well controlled on medication.  His lipids are excellent.  4. Atrial fibrillation, paroxysmal. Patient is  in sinus rhythm. His rate was controlled on beta blocker therapy. He was minimally symptomatic. He does have a high Chadvasc score of 4. Continue anticoagulation with Eliquis 5 mg twice a day.   5. DM type 2. Recent A1c 6.4%. Working more on diet and weight loss.   I will follow up in 6 months with Ecg.

## 2014-12-29 ENCOUNTER — Encounter (HOSPITAL_COMMUNITY)
Admission: RE | Admit: 2014-12-29 | Discharge: 2014-12-29 | Disposition: A | Discharge status: Home / Self Care | Payer: Self-pay | Source: Ambulatory Visit | Attending: Cardiology | Admitting: Cardiology

## 2014-12-30 ENCOUNTER — Telehealth: Payer: Self-pay

## 2014-12-30 ENCOUNTER — Encounter (HOSPITAL_COMMUNITY)
Admission: RE | Admit: 2014-12-30 | Discharge: 2014-12-30 | Disposition: A | Discharge status: Home / Self Care | Payer: Self-pay | Source: Ambulatory Visit | Attending: Cardiology | Admitting: Cardiology

## 2014-12-30 MED ORDER — NITROGLYCERIN 0.4 MG SL SUBL: 0.4000 mg | SUBLINGUAL_TABLET | SUBLINGUAL | Status: DC | PRN

## 2014-12-30 NOTE — Telephone Encounter (Signed)
Patient walked in office requesting refill for NTG.Refill sent to pharmacy.

## 2015-01-03 ENCOUNTER — Encounter (HOSPITAL_COMMUNITY): Payer: Self-pay

## 2015-01-03 DIAGNOSIS — I4891 Unspecified atrial fibrillation: Secondary | ICD-10-CM | POA: Insufficient documentation

## 2015-01-03 DIAGNOSIS — I2510 Atherosclerotic heart disease of native coronary artery without angina pectoris: Secondary | ICD-10-CM | POA: Insufficient documentation

## 2015-01-05 ENCOUNTER — Encounter (HOSPITAL_COMMUNITY)
Admission: RE | Admit: 2015-01-05 | Discharge: 2015-01-05 | Disposition: A | Discharge status: Home / Self Care | Payer: Self-pay | Source: Ambulatory Visit | Attending: Cardiology | Admitting: Cardiology

## 2015-01-06 ENCOUNTER — Encounter (HOSPITAL_COMMUNITY)
Admission: RE | Admit: 2015-01-06 | Discharge: 2015-01-06 | Disposition: A | Discharge status: Home / Self Care | Payer: Self-pay | Source: Ambulatory Visit | Attending: Cardiology | Admitting: Cardiology

## 2015-01-09 ENCOUNTER — Other Ambulatory Visit: Payer: Self-pay | Admitting: Internal Medicine

## 2015-01-10 ENCOUNTER — Encounter (HOSPITAL_COMMUNITY)
Admission: RE | Admit: 2015-01-10 | Discharge: 2015-01-10 | Disposition: A | Discharge status: Home / Self Care | Payer: Self-pay | Source: Ambulatory Visit | Attending: Cardiology | Admitting: Cardiology

## 2015-01-11 ENCOUNTER — Inpatient Hospital Stay (HOSPITAL_COMMUNITY)
Admission: EM | Admit: 2015-01-11 | Discharge: 2015-01-15 | DRG: 872 | Disposition: A | Discharge status: Home / Self Care | Payer: Medicare Other | Attending: Family Medicine | Admitting: Family Medicine

## 2015-01-11 ENCOUNTER — Other Ambulatory Visit (HOSPITAL_COMMUNITY): Payer: Self-pay

## 2015-01-11 ENCOUNTER — Emergency Department (HOSPITAL_COMMUNITY): Payer: Medicare Other

## 2015-01-11 ENCOUNTER — Encounter (HOSPITAL_COMMUNITY): Payer: Self-pay | Admitting: Emergency Medicine

## 2015-01-11 DIAGNOSIS — R404 Transient alteration of awareness: Secondary | ICD-10-CM | POA: Diagnosis not present

## 2015-01-11 DIAGNOSIS — R0789 Other chest pain: Secondary | ICD-10-CM | POA: Diagnosis not present

## 2015-01-11 DIAGNOSIS — G4733 Obstructive sleep apnea (adult) (pediatric): Secondary | ICD-10-CM | POA: Diagnosis present

## 2015-01-11 DIAGNOSIS — E876 Hypokalemia: Secondary | ICD-10-CM | POA: Diagnosis not present

## 2015-01-11 DIAGNOSIS — E669 Obesity, unspecified: Secondary | ICD-10-CM | POA: Diagnosis present

## 2015-01-11 DIAGNOSIS — N390 Urinary tract infection, site not specified: Secondary | ICD-10-CM | POA: Diagnosis not present

## 2015-01-11 DIAGNOSIS — N189 Chronic kidney disease, unspecified: Secondary | ICD-10-CM | POA: Diagnosis present

## 2015-01-11 DIAGNOSIS — I4891 Unspecified atrial fibrillation: Secondary | ICD-10-CM | POA: Diagnosis present

## 2015-01-11 DIAGNOSIS — N400 Enlarged prostate without lower urinary tract symptoms: Secondary | ICD-10-CM | POA: Diagnosis present

## 2015-01-11 DIAGNOSIS — Z87891 Personal history of nicotine dependence: Secondary | ICD-10-CM

## 2015-01-11 DIAGNOSIS — K5900 Constipation, unspecified: Secondary | ICD-10-CM | POA: Diagnosis not present

## 2015-01-11 DIAGNOSIS — A419 Sepsis, unspecified organism: Principal | ICD-10-CM | POA: Diagnosis present

## 2015-01-11 DIAGNOSIS — I25119 Atherosclerotic heart disease of native coronary artery with unspecified angina pectoris: Secondary | ICD-10-CM | POA: Diagnosis present

## 2015-01-11 DIAGNOSIS — E785 Hyperlipidemia, unspecified: Secondary | ICD-10-CM | POA: Diagnosis present

## 2015-01-11 DIAGNOSIS — E119 Type 2 diabetes mellitus without complications: Secondary | ICD-10-CM | POA: Diagnosis not present

## 2015-01-11 DIAGNOSIS — R531 Weakness: Secondary | ICD-10-CM | POA: Diagnosis not present

## 2015-01-11 DIAGNOSIS — I248 Other forms of acute ischemic heart disease: Secondary | ICD-10-CM | POA: Diagnosis not present

## 2015-01-11 DIAGNOSIS — I25110 Atherosclerotic heart disease of native coronary artery with unstable angina pectoris: Secondary | ICD-10-CM | POA: Diagnosis not present

## 2015-01-11 DIAGNOSIS — R509 Fever, unspecified: Secondary | ICD-10-CM

## 2015-01-11 DIAGNOSIS — I480 Paroxysmal atrial fibrillation: Secondary | ICD-10-CM | POA: Diagnosis present

## 2015-01-11 DIAGNOSIS — I129 Hypertensive chronic kidney disease with stage 1 through stage 4 chronic kidney disease, or unspecified chronic kidney disease: Secondary | ICD-10-CM | POA: Diagnosis present

## 2015-01-11 DIAGNOSIS — I2510 Atherosclerotic heart disease of native coronary artery without angina pectoris: Secondary | ICD-10-CM | POA: Diagnosis present

## 2015-01-11 DIAGNOSIS — N12 Tubulo-interstitial nephritis, not specified as acute or chronic: Secondary | ICD-10-CM | POA: Diagnosis present

## 2015-01-11 DIAGNOSIS — I959 Hypotension, unspecified: Secondary | ICD-10-CM | POA: Diagnosis present

## 2015-01-11 DIAGNOSIS — Z6830 Body mass index (BMI) 30.0-30.9, adult: Secondary | ICD-10-CM

## 2015-01-11 DIAGNOSIS — Z7901 Long term (current) use of anticoagulants: Secondary | ICD-10-CM

## 2015-01-11 DIAGNOSIS — R079 Chest pain, unspecified: Secondary | ICD-10-CM

## 2015-01-11 HISTORY — DX: Acute myocardial infarction, unspecified: I21.9

## 2015-01-11 HISTORY — DX: Adverse effect of unspecified anesthetic, initial encounter: T41.45XA

## 2015-01-11 HISTORY — DX: Other complications of anesthesia, initial encounter: T88.59XA

## 2015-01-11 MED ORDER — VANCOMYCIN HCL 10 G IV SOLR
1500.0000 mg | Freq: Once | INTRAVENOUS | Status: AC
  Administered 2015-01-12: 1500 mg via INTRAVENOUS
  Filled 2015-01-11: qty 1500

## 2015-01-11 MED ORDER — PIPERACILLIN-TAZOBACTAM 3.375 G IVPB 30 MIN
3.3750 g | Freq: Once | INTRAVENOUS | Status: AC
  Administered 2015-01-12: 3.375 g via INTRAVENOUS
  Filled 2015-01-11: qty 50

## 2015-01-11 MED ORDER — IBUPROFEN 400 MG PO TABS
600.0000 mg | ORAL_TABLET | Freq: Once | ORAL | Status: AC
  Administered 2015-01-11: 600 mg via ORAL
  Filled 2015-01-11 (×2): qty 1

## 2015-01-11 MED ORDER — SODIUM CHLORIDE 0.9 % IV BOLUS (SEPSIS)
1000.0000 mL | Freq: Once | INTRAVENOUS | Status: AC
  Administered 2015-01-12: 1000 mL via INTRAVENOUS

## 2015-01-11 MED ORDER — SODIUM CHLORIDE 0.9 % IV BOLUS (SEPSIS)
1000.0000 mL | Freq: Once | INTRAVENOUS | Status: AC
  Administered 2015-01-11: 1000 mL via INTRAVENOUS

## 2015-01-11 NOTE — ED Provider Notes (Signed)
CSN: [id number]     Arrival date & time 01/11/15  2311 History   None    Chief Complaint  Patient presents with  . Fever  . tick bite yesterday      (Consider location/radiation/quality/duration/timing/severity/associated sxs/prior Treatment) HPI   74 year old male with rigors. Had generalized weakness/fatigue throughout the day. Shortly before arrival, he began having uncontrollable shaking chills. He denies any acute pain anywhere. No respiratory complaints. No nausea or vomiting. No diarrhea. No sick contacts. Over the past 2 days he has had increased urinary frequency. No dysuria or hematuria. Additionally reports 2 tick bites along his waistline. First one was noted on May 1. He noticed and removed a second tick yesterday. Mild rash around the site of the first tick bite. No neurological complaints. No arthralgias.  Past Medical History  Diagnosis Date  . CAD (coronary artery disease)     Dr Peter Martinique  . HTN (hypertension)   . HLD (hyperlipidemia)   . DM (diabetes mellitus)   . ED (erectile dysfunction)   . Carotid stenosis   . Obesity   . OSA (obstructive sleep apnea)     oral appliance, Dr Ron Parker  . Paroxysmal atrial fibrillation    Past Surgical History  Procedure Laterality Date  . Appendectomy  1958  . Ankle fracture surgery  1993  . Hand surgery  1965  . Tonsillectomy    . Cardiac catheterization  2007 & 2011  . Colonoscopy  2002    negative; Dr Olevia Perches   Family History  Problem Relation Age of Onset  . Diabetes Mother   . Colon cancer Paternal Uncle   . Heart attack Father      4 vessel CBAG @ 49  . Diabetes Brother   . Stroke Neg Hx    History  Substance Use Topics  . Smoking status: Former Smoker -- 1.00 packs/day    Quit date: 09/02/1990  . Smokeless tobacco: Never Used     Comment: onset age 100-50 up to 1 ppd  . Alcohol Use: No    Review of Systems  All systems reviewed and negative, other than as noted in HPI.   Allergies  Acetaminophen  and Oxycodone-acetaminophen  Home Medications   Prior to Admission medications   Medication Sig Start Date End Date Taking? Authorizing Provider  amLODipine (NORVASC) 2.5 MG tablet Take 1 tablet (2.5 mg total) by mouth daily. 07/11/14   Peter M Martinique, MD  apixaban (ELIQUIS) 5 MG TABS tablet Take 1 tablet (5 mg total) by mouth 2 (two) times daily. 09/09/14   Peter M Martinique, MD  carvedilol (COREG) 12.5 MG tablet TAKE 1 AND 1/2 TABLETS TWICE DAILY WITH MEALS 07/08/14   Peter M Martinique, MD  fish oil-omega-3 fatty acids 1000 MG capsule Take 2 g by mouth daily.      Historical Provider, MD  losartan (COZAAR) 100 MG tablet TAKE 1 TABLET BY MOUTH EVERY DAY 07/08/14   Peter M Martinique, MD  metFORMIN (GLUCOPHAGE) 500 MG tablet TAKE ONE TABLET BY MOUTH EVERY MORNING 01/09/15   Hendricks Limes, MD  Multiple Vitamin (MULTIVITAMIN) tablet Take 1 tablet by mouth daily.    Historical Provider, MD  nitroGLYCERIN (NITROSTAT) 0.4 MG SL tablet Place 1 tablet (0.4 mg total) under the tongue every 5 (five) minutes as needed. 12/30/14   Peter M Martinique, MD  Saw Palmetto, Serenoa repens, (SAW PALMETTO PO) Take 1 tablet by mouth daily.      Historical Provider, MD  simvastatin (ZOCOR)  40 MG tablet TAKE 1 TABLET BY MOUTH AT BEDTIME 07/08/14   Peter M Martinique, MD   BP 110/45 mmHg  Pulse 88  Temp(Src) 100.8 F (38.2 C) (Oral)  Resp 24  Ht 5\' 8"  (1.727 m)  Wt 194 lb (87.998 kg)  BMI 29.50 kg/m2  SpO2 89% Physical Exam  Constitutional: He is oriented to person, place, and time. He appears well-developed and well-nourished. No distress.  Laying in bed. Patient is diaphoretic, but he does not appear acutely distressed.  HENT:  Head: Normocephalic and atraumatic.  Eyes: Conjunctivae are normal. Pupils are equal, round, and reactive to light. Right eye exhibits no discharge. Left eye exhibits no discharge.  Neck: Neck supple.  Cardiovascular: Normal rate, regular rhythm and normal heart sounds.  Exam reveals no gallop and no  friction rub.   No murmur heard. Pulmonary/Chest: Effort normal and breath sounds normal. No respiratory distress.  Abdominal: Soft. He exhibits no distension. There is no tenderness.  Musculoskeletal: He exhibits no edema or tenderness.  Neurological: He is alert and oriented to person, place, and time. No cranial nerve deficit. He exhibits normal muscle tone. Coordination normal.  Skin: Skin is warm. He is diaphoretic.  Patient with a small area of roughly circular erythema in the right lower quadrant of the abdomen.Pt marked this area himself with black marker which obscures some of it, but about the size of a silver dollar. Nontender. No induration. No fluctuance. No central clearing.   Psychiatric: He has a normal mood and affect. His behavior is normal. Thought content normal.  Nursing note and vitals reviewed.   ED Course  Procedures (including critical care time)  CRITICAL CARE Performed by: Virgel Manifold Total critical care time: 35 minutes Sepsis with multiple fluid boluses and two or more antibiotics.  Critical care time was exclusive of separately billable procedures and treating other patients. Critical care was necessary to treat or prevent imminent or life-threatening deterioration. Critical care was time spent personally by me on the following activities: development of treatment plan with patient and/or surrogate as well as nursing, discussions with consultants, evaluation of patient's response to treatment, examination of patient, obtaining history from patient or surrogate, ordering and performing treatments and interventions, ordering and review of laboratory studies, ordering and review of radiographic studies, pulse oximetry and re-evaluation of patient's condition.  Labs Review Labs Reviewed  CBC WITH DIFFERENTIAL/PLATELET - Abnormal; Notable for the following:    WBC 16.5 (*)    HCT 38.9 (*)    Neutrophils Relative % 89 (*)    Neutro Abs 14.6 (*)    Lymphocytes  Relative 5 (*)    All other components within normal limits  COMPREHENSIVE METABOLIC PANEL - Abnormal; Notable for the following:    Glucose, Bld 140 (*)    Calcium 8.1 (*)    Total Protein 6.1 (*)    Albumin 3.3 (*)    Total Bilirubin 1.7 (*)    All other components within normal limits  URINALYSIS, ROUTINE W REFLEX MICROSCOPIC - Abnormal; Notable for the following:    APPearance CLOUDY (*)    Hgb urine dipstick SMALL (*)    Nitrite POSITIVE (*)    Leukocytes, UA LARGE (*)    All other components within normal limits  HEPATIC FUNCTION PANEL - Abnormal; Notable for the following:    Total Protein 6.3 (*)    Albumin 3.3 (*)    Total Bilirubin 1.6 (*)    Indirect Bilirubin 1.3 (*)  All other components within normal limits  URINE MICROSCOPIC-ADD ON - Abnormal; Notable for the following:    Bacteria, UA MANY (*)    All other components within normal limits  TROPONIN I - Abnormal; Notable for the following:    Troponin I 0.04 (*)    All other components within normal limits  TROPONIN I - Abnormal; Notable for the following:    Troponin I 0.26 (*)    All other components within normal limits  PROTIME-INR - Abnormal; Notable for the following:    Prothrombin Time 20.6 (*)    INR 1.75 (*)    All other components within normal limits  BASIC METABOLIC PANEL - Abnormal; Notable for the following:    Glucose, Bld 150 (*)    Calcium 7.7 (*)    GFR calc non Af Amer 60 (*)    All other components within normal limits  CBC - Abnormal; Notable for the following:    WBC 19.4 (*)    Hemoglobin 12.5 (*)    HCT 37.4 (*)    All other components within normal limits  GLUCOSE, CAPILLARY - Abnormal; Notable for the following:    Glucose-Capillary 153 (*)    All other components within normal limits  MRSA PCR SCREENING  CULTURE, BLOOD (ROUTINE X 2)  CULTURE, BLOOD (ROUTINE X 2)  URINE CULTURE  CULTURE, BLOOD (ROUTINE X 2)  CULTURE, BLOOD (ROUTINE X 2)  LACTIC ACID, PLASMA  LACTIC ACID,  PLASMA  LACTIC ACID, PLASMA  PROCALCITONIN  APTT  GLUCOSE, CAPILLARY  ROCKY MTN SPOTTED FVR ABS PNL(IGG+IGM)  B. BURGDORFI ANTIBODIES  TROPONIN I  HEMOGLOBIN A1C  I-STAT TROPOININ, ED    Imaging Review Dg Chest 2 View  01/12/2015   CLINICAL DATA:  Fever. Known Coronary artery disease, managed medically. Hypertension.  EXAM: CHEST  2 VIEW  COMPARISON:  07/13/2013  FINDINGS: The heart size and mediastinal contours are within normal limits. Both lungs are clear. The visualized skeletal structures are unremarkable.  IMPRESSION: No active cardiopulmonary disease.   Electronically Signed   By: Andreas Newport M.D.   On: 01/12/2015 00:23     EKG Interpretation None      EKG: poor study with wander. Rhythm: normal sinus Vent. rate 86 BPM PR interval 160 ms QRS duration 93 ms QT/QTc 342/409 ms Old anterior infarct ST segments: NS ST changes    MDM   Final diagnoses:  Fever, unspecified fever cause  Urinary tract infection without hematuria, site unspecified  Chest pain, unspecified chest pain type    74 year old male with rigors. Suspect urosepsis with recent urinary symptoms. Also reports two recent tick bites within the past week and a half. Less likely tick borne illness, but will send studies. Hypotensive on arrival. IV fluid bolus and reassess. Empiric antibiotics. Also noted to be hypoxic, but no specific respiratory complaints. We'll check a chest x-ray. Lactic acid and blood cultures. Will ultimately need admission. PCP Dr. Newt Lukes.     Virgel Manifold, MD 01/12/15 1620

## 2015-01-11 NOTE — ED Notes (Signed)
Pt unable to void, given water.

## 2015-01-11 NOTE — ED Notes (Signed)
Pt reports tick bite yesterday, removed whole tick. Today woke up feeling not well, noted to be sweaty. Temp 100.8 allergic to tylenol. PT reports nausea but unable to throw up.

## 2015-01-11 NOTE — ED Notes (Signed)
Patient transported to X-ray 

## 2015-01-12 ENCOUNTER — Inpatient Hospital Stay (HOSPITAL_COMMUNITY): Payer: Medicare Other

## 2015-01-12 ENCOUNTER — Telehealth: Payer: Self-pay | Admitting: Cardiology

## 2015-01-12 ENCOUNTER — Encounter (HOSPITAL_COMMUNITY): Admission: RE | Admit: 2015-01-12 | Payer: Self-pay | Source: Ambulatory Visit

## 2015-01-12 ENCOUNTER — Encounter (HOSPITAL_COMMUNITY): Payer: Self-pay | Admitting: General Practice

## 2015-01-12 DIAGNOSIS — I209 Angina pectoris, unspecified: Secondary | ICD-10-CM | POA: Diagnosis not present

## 2015-01-12 DIAGNOSIS — N400 Enlarged prostate without lower urinary tract symptoms: Secondary | ICD-10-CM | POA: Diagnosis present

## 2015-01-12 DIAGNOSIS — R079 Chest pain, unspecified: Secondary | ICD-10-CM | POA: Diagnosis not present

## 2015-01-12 DIAGNOSIS — E785 Hyperlipidemia, unspecified: Secondary | ICD-10-CM | POA: Diagnosis not present

## 2015-01-12 DIAGNOSIS — E119 Type 2 diabetes mellitus without complications: Secondary | ICD-10-CM | POA: Diagnosis present

## 2015-01-12 DIAGNOSIS — I129 Hypertensive chronic kidney disease with stage 1 through stage 4 chronic kidney disease, or unspecified chronic kidney disease: Secondary | ICD-10-CM | POA: Diagnosis present

## 2015-01-12 DIAGNOSIS — E118 Type 2 diabetes mellitus with unspecified complications: Secondary | ICD-10-CM | POA: Diagnosis not present

## 2015-01-12 DIAGNOSIS — N12 Tubulo-interstitial nephritis, not specified as acute or chronic: Secondary | ICD-10-CM | POA: Diagnosis present

## 2015-01-12 DIAGNOSIS — E876 Hypokalemia: Secondary | ICD-10-CM | POA: Diagnosis not present

## 2015-01-12 DIAGNOSIS — E669 Obesity, unspecified: Secondary | ICD-10-CM | POA: Diagnosis present

## 2015-01-12 DIAGNOSIS — K5900 Constipation, unspecified: Secondary | ICD-10-CM | POA: Diagnosis not present

## 2015-01-12 DIAGNOSIS — I248 Other forms of acute ischemic heart disease: Secondary | ICD-10-CM | POA: Diagnosis not present

## 2015-01-12 DIAGNOSIS — I2510 Atherosclerotic heart disease of native coronary artery without angina pectoris: Secondary | ICD-10-CM | POA: Diagnosis not present

## 2015-01-12 DIAGNOSIS — I959 Hypotension, unspecified: Secondary | ICD-10-CM | POA: Diagnosis present

## 2015-01-12 DIAGNOSIS — A419 Sepsis, unspecified organism: Principal | ICD-10-CM

## 2015-01-12 DIAGNOSIS — I952 Hypotension due to drugs: Secondary | ICD-10-CM

## 2015-01-12 DIAGNOSIS — G4733 Obstructive sleep apnea (adult) (pediatric): Secondary | ICD-10-CM | POA: Diagnosis present

## 2015-01-12 DIAGNOSIS — I25110 Atherosclerotic heart disease of native coronary artery with unstable angina pectoris: Secondary | ICD-10-CM | POA: Diagnosis not present

## 2015-01-12 DIAGNOSIS — R509 Fever, unspecified: Secondary | ICD-10-CM

## 2015-01-12 DIAGNOSIS — I25119 Atherosclerotic heart disease of native coronary artery with unspecified angina pectoris: Secondary | ICD-10-CM | POA: Diagnosis present

## 2015-01-12 DIAGNOSIS — N390 Urinary tract infection, site not specified: Secondary | ICD-10-CM

## 2015-01-12 DIAGNOSIS — Z6830 Body mass index (BMI) 30.0-30.9, adult: Secondary | ICD-10-CM | POA: Diagnosis not present

## 2015-01-12 DIAGNOSIS — Z7901 Long term (current) use of anticoagulants: Secondary | ICD-10-CM | POA: Diagnosis not present

## 2015-01-12 DIAGNOSIS — Z87891 Personal history of nicotine dependence: Secondary | ICD-10-CM | POA: Diagnosis not present

## 2015-01-12 DIAGNOSIS — I480 Paroxysmal atrial fibrillation: Secondary | ICD-10-CM | POA: Diagnosis not present

## 2015-01-12 DIAGNOSIS — N189 Chronic kidney disease, unspecified: Secondary | ICD-10-CM | POA: Diagnosis present

## 2015-01-12 LAB — BASIC METABOLIC PANEL
Anion gap: 8 (ref 5–15)
BUN: 10 mg/dL (ref 6–20)
CO2: 24 mmol/L (ref 22–32)
Calcium: 7.7 mg/dL — ABNORMAL LOW (ref 8.9–10.3)
Chloride: 109 mmol/L (ref 101–111)
Creatinine, Ser: 1.17 mg/dL (ref 0.61–1.24)
GFR calc Af Amer: >60 mL/min (ref >60)
GFR calc non Af Amer: 60 mL/min — ABNORMAL LOW (ref >60)
Glucose, Bld: 150 mg/dL — ABNORMAL HIGH (ref 65–99)
Potassium: 4 mmol/L (ref 3.5–5.1)
Sodium: 141 mmol/L (ref 135–145)

## 2015-01-12 LAB — CBC WITH DIFFERENTIAL/PLATELET
Basophils Absolute: 0 10*3/uL (ref 0.0–0.1)
Basophils Relative: 0 % (ref 0–1)
Eosinophils Absolute: 0 10*3/uL (ref 0.0–0.7)
Eosinophils Relative: 0 % (ref 0–5)
HCT: 38.9 % — ABNORMAL LOW (ref 39.0–52.0)
Hemoglobin: 13.1 g/dL (ref 13.0–17.0)
Lymphocytes Relative: 5 % — ABNORMAL LOW (ref 12–46)
Lymphs Abs: 0.9 10*3/uL (ref 0.7–4.0)
MCH: 29.4 pg (ref 26.0–34.0)
MCHC: 33.7 g/dL (ref 30.0–36.0)
MCV: 87.2 fL (ref 78.0–100.0)
Monocytes Absolute: 1 10*3/uL (ref 0.1–1.0)
Monocytes Relative: 6 % (ref 3–12)
Neutro Abs: 14.6 10*3/uL — ABNORMAL HIGH (ref 1.7–7.7)
Neutrophils Relative %: 89 % — ABNORMAL HIGH (ref 43–77)
Platelets: 156 10*3/uL (ref 150–400)
RBC: 4.46 MIL/uL (ref 4.22–5.81)
RDW: 13 % (ref 11.5–15.5)
WBC: 16.5 10*3/uL — ABNORMAL HIGH (ref 4.0–10.5)

## 2015-01-12 LAB — HEPATIC FUNCTION PANEL
ALT: 19 U/L (ref 17–63)
AST: 19 U/L (ref 15–41)
Albumin: 3.3 g/dL — ABNORMAL LOW (ref 3.5–5.0)
Alkaline Phosphatase: 68 U/L (ref 38–126)
Bilirubin, Direct: 0.3 mg/dL (ref 0.1–0.5)
Indirect Bilirubin: 1.3 mg/dL — ABNORMAL HIGH (ref 0.3–0.9)
Total Bilirubin: 1.6 mg/dL — ABNORMAL HIGH (ref 0.3–1.2)
Total Protein: 6.3 g/dL — ABNORMAL LOW (ref 6.5–8.1)

## 2015-01-12 LAB — COMPREHENSIVE METABOLIC PANEL
ALT: 20 U/L (ref 17–63)
AST: 19 U/L (ref 15–41)
Albumin: 3.3 g/dL — ABNORMAL LOW (ref 3.5–5.0)
Alkaline Phosphatase: 72 U/L (ref 38–126)
Anion gap: 9 (ref 5–15)
BUN: 12 mg/dL (ref 6–20)
CO2: 22 mmol/L (ref 22–32)
Calcium: 8.1 mg/dL — ABNORMAL LOW (ref 8.9–10.3)
Chloride: 105 mmol/L (ref 101–111)
Creatinine, Ser: 1.15 mg/dL (ref 0.61–1.24)
GFR calc Af Amer: >60 mL/min (ref >60)
GFR calc non Af Amer: >60 mL/min (ref >60)
Glucose, Bld: 140 mg/dL — ABNORMAL HIGH (ref 65–99)
Potassium: 3.5 mmol/L (ref 3.5–5.1)
Sodium: 136 mmol/L (ref 135–145)
Total Bilirubin: 1.7 mg/dL — ABNORMAL HIGH (ref 0.3–1.2)
Total Protein: 6.1 g/dL — ABNORMAL LOW (ref 6.5–8.1)

## 2015-01-12 LAB — GLUCOSE, CAPILLARY
Glucose-Capillary: 153 mg/dL — ABNORMAL HIGH (ref 65–99)
Glucose-Capillary: 70 mg/dL (ref 65–99)
Glucose-Capillary: 129 mg/dL — ABNORMAL HIGH (ref 65–99)
Glucose-Capillary: 118 mg/dL — ABNORMAL HIGH (ref 65–99)

## 2015-01-12 LAB — URINALYSIS, ROUTINE W REFLEX MICROSCOPIC
Bilirubin Urine: NEGATIVE
Glucose, UA: NEGATIVE mg/dL
Ketones, ur: NEGATIVE mg/dL
Nitrite: POSITIVE — AB
Protein, ur: NEGATIVE mg/dL
Specific Gravity, Urine: 1.008 (ref 1.005–1.030)
Urobilinogen, UA: 0.2 mg/dL (ref 0.0–1.0)
pH: 5 (ref 5.0–8.0)

## 2015-01-12 LAB — TROPONIN I
Troponin I: 0.04 ng/mL — ABNORMAL HIGH (ref <0.031)
Troponin I: 0.26 ng/mL — ABNORMAL HIGH (ref <0.031)
Troponin I: 0.71 ng/mL (ref <0.031)

## 2015-01-12 LAB — CBC
HCT: 37.4 % — ABNORMAL LOW (ref 39.0–52.0)
Hemoglobin: 12.5 g/dL — ABNORMAL LOW (ref 13.0–17.0)
MCH: 29.5 pg (ref 26.0–34.0)
MCHC: 33.4 g/dL (ref 30.0–36.0)
MCV: 88.2 fL (ref 78.0–100.0)
Platelets: 160 10*3/uL (ref 150–400)
RBC: 4.24 MIL/uL (ref 4.22–5.81)
RDW: 13.4 % (ref 11.5–15.5)
WBC: 19.4 10*3/uL — ABNORMAL HIGH (ref 4.0–10.5)

## 2015-01-12 LAB — APTT: aPTT: 32 seconds (ref 24–37)

## 2015-01-12 LAB — PROCALCITONIN: Procalcitonin: 7.5 ng/mL

## 2015-01-12 LAB — I-STAT TROPONIN, ED: Troponin i, poc: 0.01 ng/mL (ref 0.00–0.08)

## 2015-01-12 LAB — LACTIC ACID, PLASMA
Lactic Acid, Venous: 1.2 mmol/L (ref 0.5–2.0)
Lactic Acid, Venous: 1.1 mmol/L (ref 0.5–2.0)
Lactic Acid, Venous: 1 mmol/L (ref 0.5–2.0)

## 2015-01-12 LAB — URINE MICROSCOPIC-ADD ON

## 2015-01-12 LAB — MRSA PCR SCREENING: MRSA by PCR: NEGATIVE

## 2015-01-12 LAB — PROTIME-INR
INR: 1.75 — ABNORMAL HIGH (ref 0.00–1.49)
Prothrombin Time: 20.6 seconds — ABNORMAL HIGH (ref 11.6–15.2)

## 2015-01-12 MED ORDER — APIXABAN 5 MG PO TABS
5.0000 mg | ORAL_TABLET | Freq: Two times a day (BID) | ORAL | Status: DC
  Filled 2015-01-12 (×3): qty 1

## 2015-01-12 MED ORDER — SODIUM CHLORIDE 0.9 % IV SOLN
INTRAVENOUS | Status: DC
  Administered 2015-01-12 (×2): via INTRAVENOUS

## 2015-01-12 MED ORDER — HYDROMORPHONE HCL 1 MG/ML IJ SOLN: 0.5000 mg | INTRAMUSCULAR | Status: DC | PRN

## 2015-01-12 MED ORDER — NITROGLYCERIN 0.4 MG SL SUBL
0.4000 mg | SUBLINGUAL_TABLET | SUBLINGUAL | Status: DC | PRN
  Administered 2015-01-12: 0.4 mg via SUBLINGUAL
  Filled 2015-01-12: qty 1

## 2015-01-12 MED ORDER — ASPIRIN 81 MG PO CHEW
324.0000 mg | CHEWABLE_TABLET | Freq: Once | ORAL | Status: AC
  Administered 2015-01-12: 324 mg via ORAL
  Filled 2015-01-12: qty 4

## 2015-01-12 MED ORDER — ALUM & MAG HYDROXIDE-SIMETH 200-200-20 MG/5ML PO SUSP
30.0000 mL | Freq: Four times a day (QID) | ORAL | Status: DC | PRN
  Filled 2015-01-12: qty 30

## 2015-01-12 MED ORDER — INSULIN ASPART 100 UNIT/ML ~~LOC~~ SOLN: 0.0000 [IU] | Freq: Every day | SUBCUTANEOUS | Status: DC

## 2015-01-12 MED ORDER — HEPARIN BOLUS VIA INFUSION
2000.0000 [IU] | Freq: Once | INTRAVENOUS | Status: AC
  Administered 2015-01-12: 21:00:00 2000 [IU] via INTRAVENOUS
  Filled 2015-01-12: qty 2000

## 2015-01-12 MED ORDER — SODIUM CHLORIDE 0.9 % IV SOLN
INTRAVENOUS | Status: DC
  Administered 2015-01-12 (×2): via INTRAVENOUS

## 2015-01-12 MED ORDER — PIPERACILLIN-TAZOBACTAM 3.375 G IVPB 30 MIN: 3.3750 g | Freq: Once | INTRAVENOUS | Status: DC

## 2015-01-12 MED ORDER — ONDANSETRON HCL 4 MG/2ML IJ SOLN: 4.0000 mg | Freq: Four times a day (QID) | INTRAMUSCULAR | Status: DC | PRN

## 2015-01-12 MED ORDER — VANCOMYCIN HCL IN DEXTROSE 1-5 GM/200ML-% IV SOLN
1000.0000 mg | Freq: Two times a day (BID) | INTRAVENOUS | Status: DC
  Administered 2015-01-12 – 2015-01-13 (×3): 1000 mg via INTRAVENOUS
  Filled 2015-01-12 (×4): qty 200

## 2015-01-12 MED ORDER — ONDANSETRON HCL 4 MG PO TABS: 4.0000 mg | ORAL_TABLET | Freq: Four times a day (QID) | ORAL | Status: DC | PRN

## 2015-01-12 MED ORDER — HEPARIN (PORCINE) IN NACL 100-0.45 UNIT/ML-% IJ SOLN
1000.0000 [IU]/h | INTRAMUSCULAR | Status: DC
  Administered 2015-01-12: 1000 [IU]/h via INTRAVENOUS
  Filled 2015-01-12: qty 250

## 2015-01-12 MED ORDER — INSULIN ASPART 100 UNIT/ML ~~LOC~~ SOLN
0.0000 [IU] | Freq: Three times a day (TID) | SUBCUTANEOUS | Status: DC
  Administered 2015-01-12 – 2015-01-13 (×2): 2 [IU] via SUBCUTANEOUS
  Administered 2015-01-14: 3 [IU] via SUBCUTANEOUS
  Administered 2015-01-14 – 2015-01-15 (×2): 2 [IU] via SUBCUTANEOUS

## 2015-01-12 MED ORDER — SIMVASTATIN 40 MG PO TABS
40.0000 mg | ORAL_TABLET | Freq: Every day | ORAL | Status: DC
  Administered 2015-01-12 – 2015-01-13 (×2): 40 mg via ORAL
  Filled 2015-01-12: qty 1
  Filled 2015-01-12: qty 2
  Filled 2015-01-12: qty 1
  Filled 2015-01-12: qty 2
  Filled 2015-01-12: qty 1

## 2015-01-12 MED ORDER — ADULT MULTIVITAMIN W/MINERALS CH
1.0000 | ORAL_TABLET | Freq: Every day | ORAL | Status: DC
  Administered 2015-01-12 – 2015-01-15 (×4): 1 via ORAL
  Filled 2015-01-12 (×5): qty 1

## 2015-01-12 MED ORDER — PIPERACILLIN-TAZOBACTAM 3.375 G IVPB
3.3750 g | Freq: Three times a day (TID) | INTRAVENOUS | Status: DC
  Administered 2015-01-12 – 2015-01-14 (×7): 3.375 g via INTRAVENOUS
  Filled 2015-01-12 (×9): qty 50

## 2015-01-12 MED ORDER — ASPIRIN EC 81 MG PO TBEC
81.0000 mg | DELAYED_RELEASE_TABLET | Freq: Every day | ORAL | Status: DC
  Administered 2015-01-13: 81 mg via ORAL
  Filled 2015-01-12: qty 1

## 2015-01-12 MED ORDER — ASPIRIN 81 MG PO CHEW
81.0000 mg | CHEWABLE_TABLET | ORAL | Status: AC
  Administered 2015-01-13: 07:00:00 81 mg via ORAL
  Filled 2015-01-12: qty 1

## 2015-01-12 MED ORDER — OMEGA-3-ACID ETHYL ESTERS 1 G PO CAPS
2.0000 g | ORAL_CAPSULE | Freq: Every day | ORAL | Status: DC
  Administered 2015-01-12 – 2015-01-15 (×4): 2 g via ORAL
  Filled 2015-01-12 (×5): qty 2

## 2015-01-12 MED ORDER — MORPHINE SULFATE 4 MG/ML IJ SOLN
4.0000 mg | Freq: Once | INTRAMUSCULAR | Status: AC
  Administered 2015-01-12: 4 mg via INTRAVENOUS
  Filled 2015-01-12: qty 1

## 2015-01-12 MED ORDER — SODIUM CHLORIDE 0.9 % IV BOLUS (SEPSIS)
1000.0000 mL | Freq: Once | INTRAVENOUS | Status: AC
  Administered 2015-01-12: 1000 mL via INTRAVENOUS

## 2015-01-12 MED ORDER — IBUPROFEN 200 MG PO TABS
800.0000 mg | ORAL_TABLET | Freq: Once | ORAL | Status: AC
  Administered 2015-01-12: 17:00:00 800 mg via ORAL
  Filled 2015-01-12: qty 4

## 2015-01-12 MED ORDER — VANCOMYCIN HCL IN DEXTROSE 1-5 GM/200ML-% IV SOLN: 1000.0000 mg | Freq: Once | INTRAVENOUS | Status: DC

## 2015-01-12 MED ORDER — SODIUM CHLORIDE 0.9 % IV BOLUS (SEPSIS)
1000.0000 mL | INTRAVENOUS | Status: AC
  Administered 2015-01-12 (×2): 1000 mL via INTRAVENOUS

## 2015-01-12 MED ORDER — SODIUM CHLORIDE 0.9 % IV BOLUS (SEPSIS)
500.0000 mL | Freq: Once | INTRAVENOUS | Status: AC
  Administered 2015-01-12: 500 mL via INTRAVENOUS

## 2015-01-12 NOTE — ED Notes (Signed)
Pt reporting sudden onset tightness in chest. MD Campos notified.

## 2015-01-12 NOTE — Progress Notes (Signed)
Called to patient room at 1125 due to vigorous shivering.  This lasted for one hour.  Breathing shallow and rapid during event.  Bilateral wheezing noted.  Oxygen sat somewhat variable; mid 80's to 90's.  Oxygen 2L via Becker applied.  Temp 97.5   at onset; at 1225 temp 101.6.  Patient with poor appetite at the time.  Notified Dr. Verlon Au.

## 2015-01-12 NOTE — Consult Note (Signed)
PHARMACY CONSULT NOTE  Pharmacy Consult :  Heparin Indication : NSTEMI/ACS  Allergies: Allergies  Allergen Reactions  . Acetaminophen     Drug induced hepatitis  . Oxycodone-Acetaminophen     Drug induced hepatitis    Heparin Dosing weight : 84 kg  Vital Signs: BP 118/51 mmHg  Pulse 84  Temp(Src) 101.6 F (38.7 C) (Oral)  Resp 30  Ht 5\' 7"  (1.702 m)  Wt 193 lb 12.6 oz (87.9 kg)  BMI 30.34 kg/m2  SpO2 97%  Active Problems: Principal Problem:   Sepsis Active Problems:   Hyperlipidemia   CAD, NATIVE VESSEL   Atrial fibrillation   UTI (lower urinary tract infection)   Hypotension   Chest pain   DM (diabetes mellitus)   Urinary tract infectious disease   Labs:  Recent Labs  01/12/15 0007 01/12/15 0438  HGB 13.1 12.5*  HCT 38.9* 37.4*  PLT 156 160  APTT  --  32  LABPROT  --  20.6*  INR  --  1.75*  CREATININE 1.15 1.17   Lab Results  Component Value Date   INR 1.75* 01/12/2015   Estimated Creatinine Clearance: 58.6 mL/min (by C-G formula based on Cr of 1.17).  Medical / Surgical History: Past Medical History  Diagnosis Date  . CAD (coronary artery disease)     Dr Peter Martinique  . HTN (hypertension)   . HLD (hyperlipidemia)   . DM (diabetes mellitus)   . ED (erectile dysfunction)   . Carotid stenosis   . Obesity   . OSA (obstructive sleep apnea)     oral appliance, Dr Ron Parker  . Paroxysmal atrial fibrillation   . Complication of anesthesia     " DIFFICULTY WAKING "  . Myocardial infarction 2007   Past Surgical History  Procedure Laterality Date  . Appendectomy  1958  . Ankle fracture surgery  1993  . Hand surgery  1965  . Tonsillectomy    . Cardiac catheterization  2007 & 2011  . Colonoscopy  2002    negative; Dr Olevia Perches    MEDICATION: Medication PTA: Medication Sig  . amLODipine (NORVASC) 2.5 MG tablet Take 1 tablet (2.5 mg total) by mouth daily.  Marland Kitchen apixaban (ELIQUIS) 5 MG TABS tablet Take 1 tablet (5 mg total) by mouth 2 (two) times  daily.  . carvedilol (COREG) 12.5 MG tablet TAKE 1 AND 1/2 TABLETS TWICE DAILY WITH MEALS  . fish oil-omega-3 fatty acids 1000 MG capsule Take 2 g by mouth daily.    Marland Kitchen losartan (COZAAR) 100 MG tablet TAKE 1 TABLET BY MOUTH EVERY DAY  . metFORMIN (GLUCOPHAGE) 500 MG tablet TAKE ONE TABLET BY MOUTH EVERY MORNING  . Multiple Vitamin (MULTIVITAMIN) tablet Take 1 tablet by mouth daily.  . nitroGLYCERIN (NITROSTAT) 0.4 MG SL tablet Place 1 tablet (0.4 mg total) under the tongue every 5 (five) minutes as needed.  . Saw Palmetto, Serenoa repens, (SAW PALMETTO PO) Take 1 tablet by mouth daily.    . simvastatin (ZOCOR) 40 MG tablet TAKE 1 TABLET BY MOUTH AT BEDTIME    Scheduled:  Scheduled:  . [START ON 01/13/2015] aspirin  81 mg Oral Pre-Cath  . [START ON 01/13/2015] aspirin EC  81 mg Oral Daily  . insulin aspart  0-5 Units Subcutaneous QHS  . insulin aspart  0-9 Units Subcutaneous TID WC  . multivitamin with minerals  1 tablet Oral Daily  . omega-3 acid ethyl esters  2 g Oral Daily  . piperacillin-tazobactam (ZOSYN)  IV  3.375  g Intravenous 3 times per day  . simvastatin  40 mg Oral q1800  . vancomycin  1,000 mg Intravenous Q12H   Infusion[s]: Infusions:  . sodium chloride 75 mL/hr at 01/12/15 1902   Antibiotic[s]: Anti-infectives    Start     Dose/Rate Route Frequency Ordered Stop   01/12/15 1000  vancomycin (VANCOCIN) IVPB 1000 mg/200 mL premix     1,000 mg 200 mL/hr over 60 Minutes Intravenous Every 12 hours 01/12/15 0359     01/12/15 0600  piperacillin-tazobactam (ZOSYN) IVPB 3.375 g     3.375 g 12.5 mL/hr over 240 Minutes Intravenous 3 times per day 01/12/15 0359     01/12/15 0345  piperacillin-tazobactam (ZOSYN) IVPB 3.375 g  Status:  Discontinued     3.375 g 100 mL/hr over 30 Minutes Intravenous  Once 01/12/15 0345 01/12/15 0356   01/12/15 0345  vancomycin (VANCOCIN) IVPB 1000 mg/200 mL premix  Status:  Discontinued     1,000 mg 200 mL/hr over 60 Minutes Intravenous  Once  01/12/15 0345 01/12/15 0457   01/11/15 2345  vancomycin (VANCOCIN) 1,500 mg in sodium chloride 0.9 % 500 mL IVPB     1,500 mg 250 mL/hr over 120 Minutes Intravenous  Once 01/11/15 2335 01/12/15 0300   01/11/15 2345  piperacillin-tazobactam (ZOSYN) IVPB 3.375 g     3.375 g 100 mL/hr over 30 Minutes Intravenous  Once 01/11/15 2335 01/12/15 0106      ASSESSMENT:  74 y.o. male is currently on chronic Apixaban for history of atrial fibrillation.  Last dose of Apixaban ~ 24 hours ago.   Patient is to be started on IV Heparin for presumed NSTEMI/ACS.  GOAL:  Heparin Level  0.3 - 0.7 units/ml    PLAN: 1. Begin Heparin infusion now.   Heparin bolus 2000 units IV now, then infuse @ 1000 units/hr .  Will check Heparin Level in 8 hours    2. Daily Heparin level, CBC while on Heparin.  Monitor for bleeding complications. Follow Platelet counts.  Marthenia Rolling,  Pharm.D   01/12/2015,  7:24 PM.

## 2015-01-12 NOTE — ED Notes (Signed)
Phlebotomy at bedside.

## 2015-01-12 NOTE — Consult Note (Signed)
CARDIOLOGY CONSULT NOTE       Patient ID: Jacob Curry MRN: [id number] DOB/AGE: April 08, 1941 74 y.o.  Admit date: 01/11/2015 Referring Physician:  Verlon Au Primary Physician: Unice Cobble, MD Primary Cardiologist:  Martinique Reason for Consultation:  Chest pain  Principal Problem:   Sepsis Active Problems:   Hyperlipidemia   CAD, NATIVE VESSEL   Atrial fibrillation   UTI (lower urinary tract infection)   Hypotension   Chest pain   DM (diabetes mellitus)   Urinary tract infectious disease   HPI:   74 y.o. admitted with fever , rigors, and chills.  Possible urosepsis.  Developed SSCP typical angina relieved with nitro.  ECG non acute Troponin .04.  He has known severe CAD.  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  Also had 90% mid RCA and moderate mid LAD with high grade D1 lesions.  Been Rx medically.  In general has done well at home without angina.  Currently feeling much better with no fever and on antibiotics.  CRF HTN elevated lipids and DM.  Compliant with meds   Review of Systems  Cardiovascular: Negative for claudication.   All other systems reviewed and negative except as noted above  Past Medical History  Diagnosis Date  . CAD (coronary artery disease)     Dr Myrle Wanek Martin[place]  . HTN (hypertension)   . HLD (hyperlipidemia)   . DM (diabetes mellitus)   . ED (erectile dysfunction)   . Carotid stenosis   . Obesity   . OSA (obstructive sleep apnea)     oral appliance, Dr Ron Parker  . Paroxysmal atrial fibrillation     Family History  Problem Relation Age of Onset  . Diabetes Mother   . Colon cancer Paternal Uncle   . Heart attack Father      4 vessel CBAG @ 59  . Diabetes Brother   . Stroke Neg Hx     History   Social History  . Marital Status: Married    Spouse Name: N/A  . Number of Children: N/A  . Years of Education: N/A   Occupational History  . Art gallery manager / Haematologist    Social History Main Topics  .  Smoking status: Former Smoker -- 1.00 packs/day    Quit date: 09/02/1990  . Smokeless tobacco: Never Used     Comment: onset age 77-50 up to 1 ppd  . Alcohol Use: No  . Drug Use: Not on file  . Sexual Activity: Not on file   Other Topics Concern  . Not on file   Social History Narrative    Past Surgical History  Procedure Laterality Date  . Appendectomy  1958  . Ankle fracture surgery  1993  . Hand surgery  1965  . Tonsillectomy    . Cardiac catheterization  2007 & 2011  . Colonoscopy  2002    negative; Dr Olevia Perches     . apixaban  5 mg Oral BID  . insulin aspart  0-5 Units Subcutaneous QHS  . insulin aspart  0-9 Units Subcutaneous TID WC  . multivitamin with minerals  1 tablet Oral Daily  . omega-3 acid ethyl esters  2 g Oral Daily  . piperacillin-tazobactam (ZOSYN)  IV  3.375 g Intravenous 3 times per day  . simvastatin  40 mg Oral q1800  . vancomycin  1,000 mg Intravenous Q12H   . sodium chloride 75 mL/hr at 01/12/15 0500    Physical Exam: Blood pressure 118/58,  pulse 67, temperature 97.5 F (36.4 C), temperature source Oral, resp. rate 18, height 5\' 7"  (1.702 m), weight 193 lb 12.6 oz (87.9 kg), SpO2 92 %.   Affect appropriate Healthy:  appears stated age 83: normal Neck supple with no adenopathy JVP normal no bruits no thyromegaly Lungs clear with no wheezing and good diaphragmatic motion Heart:  S1/S2 no murmur, no rub, gallop or click PMI normal Abdomen: benighn, BS positve, no tenderness, no AAA no bruit.  No HSM or HJR Distal pulses intact with no bruits No edema Neuro non-focal Skin warm and dry No muscular weakness   Labs:   Lab Results  Component Value Date   WBC 19.4* 01/12/2015   HGB 12.5* 01/12/2015   HCT 37.4* 01/12/2015   MCV 88.2 01/12/2015   PLT 160 01/12/2015    Recent Labs Lab 01/12/15 0007 01/12/15 0438  NA 136 141  K 3.5 4.0  CL 105 109  CO2 22 24  BUN 12 10  CREATININE 1.15 1.17  CALCIUM 8.1* 7.7*  PROT 6.3*  6.1*   --   BILITOT 1.6*  1.7*  --   ALKPHOS 68  72  --   ALT 19  20  --   AST 19  19  --   GLUCOSE 140* 150*   Lab Results  Component Value Date   TROPONINI 0.04* 01/12/2015    Lab Results  Component Value Date   CHOL 97 12/21/2014   CHOL 104 05/05/2014   CHOL 111 11/15/2013   Lab Results  Component Value Date   HDL 42.80 12/21/2014   HDL 39.80 05/05/2014   HDL 45.10 11/15/2013   Lab Results  Component Value Date   LDLCALC 34 12/21/2014   LDLCALC 46 05/05/2014   LDLCALC 39 11/15/2013   Lab Results  Component Value Date   TRIG 101.0 12/21/2014   TRIG 92.0 05/05/2014   TRIG 137.0 11/15/2013   Lab Results  Component Value Date   CHOLHDL 2 12/21/2014   CHOLHDL 3 05/05/2014   CHOLHDL 2 11/15/2013   Lab Results  Component Value Date   LDLDIRECT 39.9 07/22/2008   LDLDIRECT 50.7 03/21/2008      Radiology: Dg Chest 2 View  01/12/2015   CLINICAL DATA:  Fever. Known Coronary artery disease, managed medically. Hypertension.  EXAM: CHEST  2 VIEW  COMPARISON:  07/13/2013  FINDINGS: The heart size and mediastinal contours are within normal limits. Both lungs are clear. The visualized skeletal structures are unremarkable.  IMPRESSION: No active cardiopulmonary disease.   Electronically Signed   By: Andreas Newport M.D.   On: 01/12/2015 00:23    EKG:  NSR no acute changes    ASSESSMENT AND PLAN:  CAD:  Severe 3VD been Rx medically for last 5 years.  Obviously exacerbated by febrile illness Troponin minimally elevated.  Reviewing his cath from 2011 surprised that the diagonal or mid RCA not intervened on.  Worried about progressive disease  As Dr Doug Sou notes indicate myovue will be positive given collaterals and will not help with diagnosing progressive disease.  Hopkins cath.  Will need to do tomorrow before weekend.  Discussed with patient willing to proceed.  Lab called orders written  PAF:  In NSR will hold eliquis for cath did not receive any today so should be ok for  cath tomorrow afternoon  Sepsis:  WBC elevated  On antibiotics  Hemodynamics stable  Improving   Signed: Jenkins Rouge 01/12/2015, 10:29 AM

## 2015-01-12 NOTE — H&P (Signed)
[facility] Admission History and Physical       Jacob Curry QTM:[id number] DOB: 03-23-1941 DOA: 01/11/2015  Referring physician: EDP PCP: Unice Cobble, MD  Specialists:   Chief Complaint: Fever Chills  HPI: Jacob Curry is a 74 y.o. male with history of PAF on Eliquis Rx, CAD, HTN, DM2, and Hyperlipidemia who presents to the ED with complaints of fevers chills and rigors that started at 10 pm PTA.  He reports feeling sick , he denies any cough or dysuria or ABD pain.  A Sepsis workup was initiated and he was placed on IV Vancomycin and Zosyn.  He began to develop Substernal area chest pain described as tightness and indigestion like and associated with SOB.   He rates the discomfort at a 4/10.  He was administered SL NTG x1 and Morphine and had hypotension, and was given a fluid bolus with improvement. His pain resolved.   He was referred for medicalvadmission.     Review of Systems:  Constitutional: No Weight Loss, No Weight Gain, Night Sweats, +Fevers, +Chills, + Rigors, Dizziness, Light Headedness, Fatigue, or Generalized Weakness HEENT: No Headaches, Difficulty Swallowing,Tooth/Dental Problems,Sore Throat,  No Sneezing, Rhinitis, Ear Ache, Nasal Congestion, or Post Nasal Drip,  Cardio-vascular:  +Chest pain, Orthopnea, PND, Edema in Lower Extremities, Anasarca, Dizziness, Palpitations  Resp: +Dyspnea, No DOE, No Productive Cough, No Non-Productive Cough, No Hemoptysis, No Wheezing.    GI: No Heartburn, Indigestion, Abdominal Pain, Nausea, Vomiting, Diarrhea, Constipation, Hematemesis, Hematochezia, Melena, Change in Bowel Habits,  Loss of Appetite  GU: No Dysuria, No Change in Color of Urine, No Urgency or Urinary Frequency, No Flank pain.  Musculoskeletal: No Joint Pain or Swelling, No Decreased Range of Motion, No Back Pain.  Neurologic: No Syncope, No Seizures, Muscle Weakness, Paresthesia, Vision Disturbance or Loss, No Diplopia, No Vertigo, No Difficulty  Walking,  Skin: No Rash or Lesions. Psych: No Change in Mood or Affect, No Depression or Anxiety, No Memory loss, No Confusion, or Hallucinations   Past Medical History  Diagnosis Date  . CAD (coronary artery disease)     Dr Peter Martinique  . HTN (hypertension)   . HLD (hyperlipidemia)   . DM (diabetes mellitus)   . ED (erectile dysfunction)   . Carotid stenosis   . Obesity   . OSA (obstructive sleep apnea)     oral appliance, Dr Ron Parker  . Paroxysmal atrial fibrillation      Past Surgical History  Procedure Laterality Date  . Appendectomy  1958  . Ankle fracture surgery  1993  . Hand surgery  1965  . Tonsillectomy    . Cardiac catheterization  2007 & 2011  . Colonoscopy  2002    negative; Dr Olevia Perches      Prior to Admission medications   Medication Sig Start Date End Date Taking? Authorizing Provider  amLODipine (NORVASC) 2.5 MG tablet Take 1 tablet (2.5 mg total) by mouth daily. 07/11/14  Yes Peter M Martinique, MD  apixaban (ELIQUIS) 5 MG TABS tablet Take 1 tablet (5 mg total) by mouth 2 (two) times daily. 09/09/14  Yes Peter M Martinique, MD  carvedilol (COREG) 12.5 MG tablet TAKE 1 AND 1/2 TABLETS TWICE DAILY WITH MEALS 07/08/14  Yes Peter M Martinique, MD  fish oil-omega-3 fatty acids 1000 MG capsule Take 2 g by mouth daily.     Yes Historical Provider, MD  losartan (COZAAR) 100 MG tablet TAKE 1 TABLET BY MOUTH EVERY DAY 07/08/14  Yes Peter M Martinique, MD  metFORMIN (GLUCOPHAGE) 500 MG tablet TAKE ONE TABLET BY MOUTH EVERY MORNING 01/09/15  Yes Hendricks Limes, MD  Multiple Vitamin (MULTIVITAMIN) tablet Take 1 tablet by mouth daily.   Yes Historical Provider, MD  nitroGLYCERIN (NITROSTAT) 0.4 MG SL tablet Place 1 tablet (0.4 mg total) under the tongue every 5 (five) minutes as needed. 12/30/14  Yes Peter M Martinique, MD  Saw Palmetto, Serenoa repens, (SAW PALMETTO PO) Take 1 tablet by mouth daily.     Yes Historical Provider, MD  simvastatin (ZOCOR) 40 MG tablet TAKE 1 TABLET BY MOUTH AT BEDTIME  07/08/14  Yes Peter M Martinique, MD     Allergies  Allergen Reactions  . Acetaminophen     Drug induced hepatitis  . Oxycodone-Acetaminophen     Drug induced hepatitis    Social History:  reports that he quit smoking about 24 years ago. He has never used smokeless tobacco. He reports that he does not drink alcohol. His drug history is not on file.    Family History  Problem Relation Age of Onset  . Diabetes Mother   . Colon cancer Paternal Uncle   . Heart attack Father      4 vessel CBAG @ 51  . Diabetes Brother   . Stroke Neg Hx        Physical Exam:  GEN:  Pleasant Well Nourished and Well Developed 74 y.o. Caucasian male examined and in no acute distress; cooperative with exam Filed Vitals:   01/12/15 0215 01/12/15 0230 01/12/15 0245 01/12/15 0300  BP: 126/60 133/55 138/56 117/52  Pulse: 88 89 88 90  Temp:      TempSrc:      Resp: 21 23 23 18   Height:      Weight:      SpO2: 96% 89% 94% 92%   Blood pressure 117/52, pulse 90, temperature 97.9 F (36.6 C), temperature source Oral, resp. rate 18, height 5\' 8"  (1.727 m), weight 87.998 kg (194 lb), SpO2 92 %. PSYCH: He is alert and oriented x4; does not appear anxious does not appear depressed; affect is normal HEENT: Normocephalic and Atraumatic, Mucous membranes pink; PERRLA; EOM intact; Fundi:  Benign;  No scleral icterus, Nares: Patent, Oropharynx: Clear, Fair Dentition,    Neck:  FROM, No Cervical Lymphadenopathy nor Thyromegaly or Carotid Bruit; No JVD; Breasts:: Not examined CHEST WALL: No tenderness CHEST: Normal respiration, clear to auscultation bilaterally HEART: Regular rate and rhythm; no murmurs rubs or gallops BACK: No kyphosis or scoliosis; No CVA tenderness ABDOMEN: Positive Bowel Sounds,  Obese, Soft Non-Tender, No Rebound or Guarding; No Masses, No Organomegaly. Rectal Exam: Not done EXTREMITIES: No  Cyanosis, Clubbing, or Edema; No Ulcerations. Genitalia: not examined PULSES: 2+ and symmetric SKIN:  Normal hydration no rash or ulceration CNS:  Alert and Oriented x 4, No Focal Deficits Vascular: pulses palpable throughout    Labs on Admission:  Basic Metabolic Panel:  Recent Labs Lab 01/12/15 0007  NA 136  K 3.5  CL 105  CO2 22  GLUCOSE 140*  BUN 12  CREATININE 1.15  CALCIUM 8.1*   Liver Function Tests:  Recent Labs Lab 01/12/15 0007  AST 19  19  ALT 19  20  ALKPHOS 68  72  BILITOT 1.6*  1.7*  PROT 6.3*  6.1*  ALBUMIN 3.3*  3.3*   No results for input(s): LIPASE, AMYLASE in the last 168 hours. No results for input(s): AMMONIA in the last 168 hours. CBC:  Recent Labs Lab 01/12/15 0007  WBC 16.5*  NEUTROABS 14.6*  HGB 13.1  HCT 38.9*  MCV 87.2  PLT 156   Cardiac Enzymes: No results for input(s): CKTOTAL, CKMB, CKMBINDEX, TROPONINI in the last 168 hours.  BNP (last 3 results) No results for input(s): BNP in the last 8760 hours.  ProBNP (last 3 results) No results for input(s): PROBNP in the last 8760 hours.  CBG: No results for input(s): GLUCAP in the last 168 hours.  Radiological Exams on Admission: Dg Chest 2 View  01/12/2015   CLINICAL DATA:  Fever. Known Coronary artery disease, managed medically. Hypertension.  EXAM: CHEST  2 VIEW  COMPARISON:  07/13/2013  FINDINGS: The heart size and mediastinal contours are within normal limits. Both lungs are clear. The visualized skeletal structures are unremarkable.  IMPRESSION: No active cardiopulmonary disease.   Electronically Signed   By: Andreas Newport M.D.   On: 01/12/2015 00:23     EKG: Independently reviewed. Normal Sinus Rhythm Rate = 93 Old Anterior Infarct Changes   Assessment/Plan:   74 y.o. male with  Principal Problem:   1.    Sepsis- Most Likely Site is UTI   Sepsis Protocol   Blood and Urine Cultures Sent   IV Vancomycin and Zosyn   Active Problems:   2.   Chest pain- Hx of CAD Rule Out ACS   Cardiac Monitoring   Cycle Toponins      3.   UTI (lower urinary tract  infection)   Covered by IV Zosyn   Adjust PRN Urine Culture Results     4.   Hypotension- due to Medications NTG SL, and Morphine   IV Fluid Bolus being Given    Hold BP Medications      5.   CAD, NATIVE VESSEL   See Cards Dr Martinique    Please Notify in AM       6.   Atrial fibrillation   Continue Eliquis Rx   Carvedilol  on Hold due to Hypotension   Cardica Monitoring     7.   DM (diabetes mellitus)   Hold Metformin Rx   SSI coverage PRN   Check HbA1C     8.   Hyperlipidemia   Continue Simvastatin Rx     9.   DVT Prophylaxis   On Eliquis Rx          Code Status:     FULL CODE        Family Communication:   Wife at Bedside   Disposition Plan:    Inpatient  Observation Status        Time spent:  96  Minutes      Theressa Millard [facility] Pager [phone]   If [place] Please Contact the Day Rounding Team MD for [facility]  If 7PM-7AM, Please Contact Night-Floor Coverage  [email or web address] Password [email or web address] 01/12/2015, 3:45 AM     ADDENDUM:   Patient was seen and examined on 01/12/2015

## 2015-01-12 NOTE — ED Notes (Signed)
Family at bedside. 

## 2015-01-12 NOTE — Progress Notes (Signed)
ANTIBIOTIC CONSULT NOTE - INITIAL  Pharmacy Consult for Vancomycin and Zosyn  Indication: rule out sepsis  Allergies  Allergen Reactions  . Acetaminophen     Drug induced hepatitis  . Oxycodone-Acetaminophen     Drug induced hepatitis    Patient Measurements: Height: 5\' 8"  (172.7 cm) Weight: 194 lb (87.998 kg) IBW/kg (Calculated) : 68.4  Vital Signs: Temp: 97.9 F (36.6 C) (05/12 0159) Temp Source: Oral (05/12 0159) BP: 117/52 mmHg (05/12 0300) Pulse Rate: 90 (05/12 0300) Intake/Output from previous day: 05/11 0701 - 05/12 0700 In: 2500 [I.V.:2500] Out: 800 [Urine:800] Intake/Output from this shift: Total I/O In: 2500 [I.V.:2500] Out: 800 [Urine:800]  Labs:  Recent Labs  01/12/15 0007  WBC 16.5*  HGB 13.1  PLT 156  CREATININE 1.15   Estimated Creatinine Clearance: 60.7 mL/min (by C-G formula based on Cr of 1.15). No results for input(s): VANCOTROUGH, VANCOPEAK, VANCORANDOM, GENTTROUGH, GENTPEAK, GENTRANDOM, TOBRATROUGH, TOBRAPEAK, TOBRARND, AMIKACINPEAK, AMIKACINTROU, AMIKACIN in the last 72 hours.   Microbiology: No results found for this or any previous visit (from the past 720 hour(s)).  Medical History: Past Medical History  Diagnosis Date  . CAD (coronary artery disease)     Dr Peter Martinique  . HTN (hypertension)   . HLD (hyperlipidemia)   . DM (diabetes mellitus)   . ED (erectile dysfunction)   . Carotid stenosis   . Obesity   . OSA (obstructive sleep apnea)     oral appliance, Dr Ron Parker  . Paroxysmal atrial fibrillation     Medications:  Norvasc  Eliquis 5 mg BID Coreg Cozaar  Metformin  Ntg  Zocor  Fish Oil  MVI    Assessment: 74 y.o. male with fever/chills, possible sepsis, for empiric antibiotics.  Vancomycin 1500 mg IV given in ED at  0100  Goal of Therapy:  Vancomycin trough level 15-20 mcg/ml  Plan:  Vancomycin 1 g IV q12h Zosyn 3.375 g IV q8h   Caryl Pina 01/12/2015,3:54 AM

## 2015-01-12 NOTE — Progress Notes (Signed)
8:22 AM I agree with HPI/GPe and A/P per Dr. Arnoldo Morale      74  Y/o Male known CAD-just saw Dr. Martinique ~ 2 weeks ago 12/28/14 Known h/o CAD with anatomy showing total occlusion left circumflex although good collaterals EF at that time was 50-55% and he has been medically managed-repeat cath 12/2009 = no significant change Also known history of OSA not on CPAP  October 2011, P A. fib-Chad2Vasc2 ~ 3 on Elliquis, well-controlled mixed hyperlipidemia  admitted for sepsis likely from urinary origin lactic acid elevated Pro calcitonin 7 he developed chest pain at around 0 300 and was given morphine/nitroglycerin which relieved it. Chest pain was in the central chest with mild radiation to the neck lasted for about an hour EKG showed dynamic changes across precordium in the anterolateral lateral leads   Currently is chest pain-free he is a little sleepy has no other symptoms right now no fever no chills No nausea no vomiting No burning in the urine. However states that he is had small amounts of urine with strangury Active guy does a lot of things around the house Nonsmoker nondrinker for the past 30 years  HEENT EOMI NCAT pleasant looking younger than stated age CHEST chest clinically clear no added sound no rales no rhonchi CARDIAC S1-S2 no murmur rub or gallop ABDOMEN soft nontender nondistended no suprapubic tenderness  Patient Active Problem List   Diagnosis Date Noted  . UTI (lower urinary tract infection) 01/12/2015  . Sepsis 01/12/2015  . Hypotension 01/12/2015  . Chest pain 01/12/2015  . DM (diabetes mellitus) 01/12/2015  . Pain in the chest   . Pyrexia   . Cough 08/08/2014  . Atrial fibrillation 07/13/2013  . Pre-diabetes 07/23/2010  . OBSTRUCTIVE SLEEP APNEA 07/19/2010  . PREMATURE VENTRICULAR CONTRACTIONS, FREQUENT 01/31/2010  . 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 Hyperlipidemia 10/20/2008  . HYPERTENSION, BENIGN 10/20/2008  . CAD, NATIVE VESSEL 10/20/2008  . VENTRAL HERNIA 01/08/2008    . ERECTILE DYSFUNCTION 07/23/2007  . ACUTE PROSTATITIS 07/23/2007  . METABOLIC SYNDROME X 65/99/3570   Empirically treat for possible pyelonephritis. Repeat chest x-ray a.m. To ensure no pneumonic process although no cough no cold-follow urine culture, CBC plus differential in a.m. Repeat EKG this morning is still pending-cardiology consulted given changes across precordium-continue to cycle cardiac markers  may need to revisit OSA/CPAP issues as an outpatient with pulmonologist Dr. Gwenette Greet For his atrial fibrillation he should continue his Elliquis   Verneita Griffes, MD [facility] [phone]

## 2015-01-12 NOTE — Progress Notes (Signed)
Attempted report.. Secretary stated Nurse will call back when available/ready.

## 2015-01-12 NOTE — ED Provider Notes (Signed)
3:22 AM pts blood pressure dropped after first nitroglycerin. Will give 1 liter fluid bolus at this time and reevaluate  Jola Schmidt, MD 01/12/15 [phone]

## 2015-01-12 NOTE — Care Management Note (Signed)
Case Management Note  Patient Details  Name: Jacob Curry MRN: [id number] Date of Birth: Mar 21, 1941  Subjective/Objective:    Pt from home admitted with sepsis.                Action/Plan: Return to home when medically stable. CM to f/u with discharge needs.  Expected Discharge Date:                Expected Discharge Plan:  Home/Self Care  In-House Referral:     Discharge planning Services  CM Consult  Post Acute Care Choice:    Choice offered to:     DME Arranged:    DME Agency:     HH Arranged:    HH Agency:     Status of Service:  In process, will continue to follow  Medicare Important Message Given:    Date Medicare IM Given:    Medicare IM give by:    Date Additional Medicare IM Given:    Additional Medicare Important Message give by:     If discussed at [place] of Stay Meetings, dates discussed:    Additional Comments:  Sharin Mons, RN 01/12/2015, 3:41 PM

## 2015-01-12 NOTE — Progress Notes (Signed)
UR COMPLETED  

## 2015-01-12 NOTE — Progress Notes (Signed)
Called by nursing to write pre-cath orders. Also called due to next elevated troponin, which is what prompted cardiology consult earlier by Dr. Johnsie Cancel. Cath scheduled for tomorrow. The patient remains chest pain free. No bleeding reported. Will start aspirin. Also discussed issue of Eliquis with Dr. Percival Spanish - last dose was yesterday evening. Will ask pharmacy to assist with heparin per pharmacy when appropriate so that he is anticoagulated for his NSTEMI. Dayna Dunn PA-C

## 2015-01-12 NOTE — ED Provider Notes (Addendum)
Pt with UTI. Elevated WBC count. BP now 138/56 (2:53 AM). Developed some "indigestion" and chest pressure while in the ER. Still with some pain now. Will control with morphine and nitroglycerin. ecg with some lateral T wave inversions as compared to arrival ecg. Pt reports hx of CAD currently treated medically with eliquis. Will admit to hospital.    EKG Interpretation  Date/Time:  Thursday Jan 12 2015 01:28:52 EDT Ventricular Rate:  93 PR Interval:  155 QRS Duration: 105 QT Interval:  361 QTC Calculation: 449 R Axis:   62 Text Interpretation:  Sinus rhythm Probable left atrial enlargement Anterior infarct, old Borderline repolarization abnormality lateral st t wave inversions compared to prior Confirmed by Shaquasia Caponigro  MD, Lennette Bihari ([id number]) on 01/12/2015 2:46:16 AM          Results for orders placed or performed during the hospital encounter of 01/11/15  CBC with Differential  Result Value Ref Range   WBC 16.5 (H) 4.0 - 10.5 K/uL   RBC 4.46 4.22 - 5.81 MIL/uL   Hemoglobin 13.1 13.0 - 17.0 g/dL   HCT 38.9 (L) 39.0 - 52.0 %   MCV 87.2 78.0 - 100.0 fL   MCH 29.4 26.0 - 34.0 pg   MCHC 33.7 30.0 - 36.0 g/dL   RDW 13.0 11.5 - 15.5 %   Platelets 156 150 - 400 K/uL   Neutrophils Relative % 89 (H) 43 - 77 %   Neutro Abs 14.6 (H) 1.7 - 7.7 K/uL   Lymphocytes Relative 5 (L) 12 - 46 %   Lymphs Abs 0.9 0.7 - 4.0 K/uL   Monocytes Relative 6 3 - 12 %   Monocytes Absolute 1.0 0.1 - 1.0 K/uL   Eosinophils Relative 0 0 - 5 %   Eosinophils Absolute 0.0 0.0 - 0.7 K/uL   Basophils Relative 0 0 - 1 %   Basophils Absolute 0.0 0.0 - 0.1 K/uL  Comprehensive metabolic panel  Result Value Ref Range   Sodium 136 135 - 145 mmol/L   Potassium 3.5 3.5 - 5.1 mmol/L   Chloride 105 101 - 111 mmol/L   CO2 22 22 - 32 mmol/L   Glucose, Bld 140 (H) 65 - 99 mg/dL   BUN 12 6 - 20 mg/dL   Creatinine, Ser 1.15 0.61 - 1.24 mg/dL   Calcium 8.1 (L) 8.9 - 10.3 mg/dL   Total Protein 6.1 (L) 6.5 - 8.1 g/dL   Albumin 3.3  (L) 3.5 - 5.0 g/dL   AST 19 15 - 41 U/L   ALT 20 17 - 63 U/L   Alkaline Phosphatase 72 38 - 126 U/L   Total Bilirubin 1.7 (H) 0.3 - 1.2 mg/dL   GFR calc non Af Amer >60 >60 mL/min   GFR calc Af Amer >60 >60 mL/min   Anion gap 9 5 - 15  Urinalysis, Routine w reflex microscopic  Result Value Ref Range   Color, Urine YELLOW YELLOW   APPearance CLOUDY (A) CLEAR   Specific Gravity, Urine 1.008 1.005 - 1.030   pH 5.0 5.0 - 8.0   Glucose, UA NEGATIVE NEGATIVE mg/dL   Hgb urine dipstick SMALL (A) NEGATIVE   Bilirubin Urine NEGATIVE NEGATIVE   Ketones, ur NEGATIVE NEGATIVE mg/dL   Protein, ur NEGATIVE NEGATIVE mg/dL   Urobilinogen, UA 0.2 0.0 - 1.0 mg/dL   Nitrite POSITIVE (A) NEGATIVE   Leukocytes, UA LARGE (A) NEGATIVE  Lactic acid, plasma  Result Value Ref Range   Lactic Acid, Venous 1.2 0.5 - 2.0  mmol/L  Hepatic function panel  Result Value Ref Range   Total Protein 6.3 (L) 6.5 - 8.1 g/dL   Albumin 3.3 (L) 3.5 - 5.0 g/dL   AST 19 15 - 41 U/L   ALT 19 17 - 63 U/L   Alkaline Phosphatase 68 38 - 126 U/L   Total Bilirubin 1.6 (H) 0.3 - 1.2 mg/dL   Bilirubin, Direct 0.3 0.1 - 0.5 mg/dL   Indirect Bilirubin 1.3 (H) 0.3 - 0.9 mg/dL  Urine microscopic-add on  Result Value Ref Range   Squamous Epithelial / LPF RARE RARE   WBC, UA 21-50 <3 WBC/hpf   RBC / HPF 3-6 <3 RBC/hpf   Bacteria, UA MANY (A) RARE  I-Stat Troponin, ED (not at [facility])  Result Value Ref Range   Troponin i, poc 0.01 0.00 - 0.08 ng/mL   Comment 3           Dg Chest 2 View  01/12/2015   CLINICAL DATA:  Fever. Known Coronary artery disease, managed medically. Hypertension.  EXAM: CHEST  2 VIEW  COMPARISON:  07/13/2013  FINDINGS: The heart size and mediastinal contours are within normal limits. Both lungs are clear. The visualized skeletal structures are unremarkable.  IMPRESSION: No active cardiopulmonary disease.   Electronically Signed   By: Andreas Newport M.D.   On: 01/12/2015 00:23   I personally reviewed the  imaging tests through PACS system I reviewed available ER/hospitalization records through the [place], MD 01/12/15 [place], MD 01/12/15 [phone]

## 2015-01-12 NOTE — Telephone Encounter (Signed)
Pt's wife called in stating that some FMLA papers needed to be signed by Dr. Martinique for his daughter Wendall Papa and herself Rosemarie Ax Delarocha([birth year]24[birth year]44) as soon as possible in order for her to be able to take off work tomorrow for her father's procedure. Please f/u  Thanks

## 2015-01-13 ENCOUNTER — Encounter (HOSPITAL_COMMUNITY): Payer: Self-pay

## 2015-01-13 ENCOUNTER — Encounter (HOSPITAL_COMMUNITY): Payer: Self-pay | Admitting: Cardiology

## 2015-01-13 ENCOUNTER — Encounter (HOSPITAL_COMMUNITY)
Admission: EM | Disposition: A | Discharge status: Home / Self Care | Payer: Medicare Other | Source: Home / Self Care | Attending: Family Medicine

## 2015-01-13 DIAGNOSIS — N10 Acute tubulo-interstitial nephritis: Secondary | ICD-10-CM

## 2015-01-13 DIAGNOSIS — I480 Paroxysmal atrial fibrillation: Secondary | ICD-10-CM

## 2015-01-13 DIAGNOSIS — I25110 Atherosclerotic heart disease of native coronary artery with unstable angina pectoris: Secondary | ICD-10-CM

## 2015-01-13 HISTORY — PX: CARDIAC CATHETERIZATION: SHX172

## 2015-01-13 LAB — BASIC METABOLIC PANEL
Anion gap: 7 (ref 5–15)
BUN: 11 mg/dL (ref 6–20)
CO2: 24 mmol/L (ref 22–32)
Calcium: 7.9 mg/dL — ABNORMAL LOW (ref 8.9–10.3)
Chloride: 108 mmol/L (ref 101–111)
Creatinine, Ser: 1.05 mg/dL (ref 0.61–1.24)
GFR calc Af Amer: >60 mL/min (ref >60)
GFR calc non Af Amer: >60 mL/min (ref >60)
Glucose, Bld: 129 mg/dL — ABNORMAL HIGH (ref 65–99)
Potassium: 3.3 mmol/L — ABNORMAL LOW (ref 3.5–5.1)
Sodium: 139 mmol/L (ref 135–145)

## 2015-01-13 LAB — ROCKY MTN SPOTTED FVR ABS PNL(IGG+IGM)
RMSF IgG: NEGATIVE
RMSF IgM: 0.27 index (ref 0.00–0.89)

## 2015-01-13 LAB — GLUCOSE, CAPILLARY
Glucose-Capillary: 114 mg/dL — ABNORMAL HIGH (ref 65–99)
Glucose-Capillary: 163 mg/dL — ABNORMAL HIGH (ref 65–99)
Glucose-Capillary: 165 mg/dL — ABNORMAL HIGH (ref 65–99)
Glucose-Capillary: 162 mg/dL — ABNORMAL HIGH (ref 65–99)

## 2015-01-13 LAB — APTT: aPTT: 64 seconds — ABNORMAL HIGH (ref 24–37)

## 2015-01-13 LAB — B. BURGDORFI ANTIBODIES: B burgdorferi Ab IgG+IgM: <0.91 {ISR} (ref 0.00–0.90)

## 2015-01-13 LAB — HEPARIN LEVEL (UNFRACTIONATED): Heparin Unfractionated: 0.96 IU/mL — ABNORMAL HIGH (ref 0.30–0.70)

## 2015-01-13 LAB — HEMOGLOBIN A1C
Hgb A1c MFr Bld: 6.6 % — ABNORMAL HIGH (ref 4.8–5.6)
Mean Plasma Glucose: 143 mg/dL

## 2015-01-13 SURGERY — LEFT HEART CATH AND CORONARY ANGIOGRAPHY
Anesthesia: LOCAL

## 2015-01-13 MED ORDER — SODIUM CHLORIDE 0.9 % IJ SOLN
3.0000 mL | Freq: Two times a day (BID) | INTRAMUSCULAR | Status: DC
  Administered 2015-01-14 – 2015-01-15 (×4): 3 mL via INTRAVENOUS

## 2015-01-13 MED ORDER — HEPARIN (PORCINE) IN NACL 100-0.45 UNIT/ML-% IJ SOLN
1150.0000 [IU]/h | INTRAMUSCULAR | Status: DC
  Filled 2015-01-13: qty 250

## 2015-01-13 MED ORDER — APIXABAN 5 MG PO TABS
5.0000 mg | ORAL_TABLET | Freq: Two times a day (BID) | ORAL | Status: DC
  Administered 2015-01-13 – 2015-01-15 (×4): 5 mg via ORAL
  Filled 2015-01-13 (×6): qty 1

## 2015-01-13 MED ORDER — POTASSIUM CHLORIDE CRYS ER 20 MEQ PO TBCR
40.0000 meq | EXTENDED_RELEASE_TABLET | Freq: Once | ORAL | Status: AC
  Administered 2015-01-13: 07:00:00 40 meq via ORAL
  Filled 2015-01-13: qty 2

## 2015-01-13 MED ORDER — BISACODYL 10 MG RE SUPP
10.0000 mg | Freq: Every day | RECTAL | Status: DC | PRN
  Administered 2015-01-13: 10 mg via RECTAL
  Filled 2015-01-13: qty 1

## 2015-01-13 MED ORDER — SODIUM CHLORIDE 0.9 % IV SOLN: 250.0000 mL | INTRAVENOUS | Status: DC | PRN

## 2015-01-13 MED ORDER — HEPARIN SODIUM (PORCINE) 1000 UNIT/ML IJ SOLN
INTRAMUSCULAR | Status: DC | PRN
  Administered 2015-01-13: 5000 [IU] via INTRA_ARTERIAL

## 2015-01-13 MED ORDER — HEPARIN SODIUM (PORCINE) 1000 UNIT/ML IJ SOLN
INTRAMUSCULAR | Status: AC
  Filled 2015-01-13: qty 1

## 2015-01-13 MED ORDER — TAMSULOSIN HCL 0.4 MG PO CAPS
0.4000 mg | ORAL_CAPSULE | Freq: Every day | ORAL | Status: DC
  Filled 2015-01-13 (×3): qty 1

## 2015-01-13 MED ORDER — SODIUM CHLORIDE 0.9 % WEIGHT BASED INFUSION: 1.0000 mL/kg/h | INTRAVENOUS | Status: AC

## 2015-01-13 MED ORDER — HEPARIN (PORCINE) IN NACL 2-0.9 UNIT/ML-% IJ SOLN
INTRAMUSCULAR | Status: AC
  Filled 2015-01-13: qty 1500

## 2015-01-13 MED ORDER — VERAPAMIL HCL 2.5 MG/ML IV SOLN
INTRAVENOUS | Status: DC | PRN
  Administered 2015-01-13: 08:00:00 via INTRA_ARTERIAL

## 2015-01-13 MED ORDER — FENTANYL CITRATE (PF) 100 MCG/2ML IJ SOLN
INTRAMUSCULAR | Status: AC
  Filled 2015-01-13: qty 2

## 2015-01-13 MED ORDER — SODIUM CHLORIDE 0.9 % IJ SOLN: 3.0000 mL | INTRAMUSCULAR | Status: DC | PRN

## 2015-01-13 MED ORDER — MIDAZOLAM HCL 2 MG/2ML IJ SOLN
INTRAMUSCULAR | Status: AC
  Filled 2015-01-13: qty 2

## 2015-01-13 MED ORDER — IBUPROFEN 200 MG PO TABS
600.0000 mg | ORAL_TABLET | Freq: Four times a day (QID) | ORAL | Status: DC | PRN
  Administered 2015-01-13 – 2015-01-14 (×2): 600 mg via ORAL
  Filled 2015-01-13 (×2): qty 3

## 2015-01-13 MED ORDER — FENTANYL CITRATE (PF) 100 MCG/2ML IJ SOLN
INTRAMUSCULAR | Status: DC | PRN
  Administered 2015-01-13: 25 ug via INTRAVENOUS

## 2015-01-13 MED ORDER — IOHEXOL 350 MG/ML SOLN
INTRAVENOUS | Status: DC | PRN
  Administered 2015-01-13: 75 mL via INTRA_ARTERIAL

## 2015-01-13 MED ORDER — DOCUSATE SODIUM 100 MG PO CAPS
100.0000 mg | ORAL_CAPSULE | Freq: Every day | ORAL | Status: DC
  Administered 2015-01-13 – 2015-01-14 (×2): 100 mg via ORAL
  Filled 2015-01-13 (×3): qty 1

## 2015-01-13 MED ORDER — MIDAZOLAM HCL 2 MG/2ML IJ SOLN
INTRAMUSCULAR | Status: DC | PRN
  Administered 2015-01-13: 1 mg via INTRAVENOUS

## 2015-01-13 MED ORDER — LIDOCAINE HCL (PF) 1 % IJ SOLN
INTRAMUSCULAR | Status: AC
  Filled 2015-01-13: qty 30

## 2015-01-13 MED ORDER — VERAPAMIL HCL 2.5 MG/ML IV SOLN
INTRAVENOUS | Status: AC
  Filled 2015-01-13: qty 2

## 2015-01-13 SURGICAL SUPPLY — 11 items

## 2015-01-13 NOTE — Progress Notes (Signed)
ANTICOAGULATION CONSULT NOTE - Follow Up Consult  Pharmacy Consult for Heparin Indication: atrial fibrillation/ACS  Allergies  Allergen Reactions  . Acetaminophen     Drug induced hepatitis  . Oxycodone-Acetaminophen     Drug induced hepatitis    Patient Measurements: Height: 5\' 7"  (170.2 cm) Weight: 201 lb 11.5 oz (91.5 kg) IBW/kg (Calculated) : 66.1  Vital Signs: Temp: 98 F (36.7 C) (05/13 0015) Temp Source: Oral (05/13 0015) BP: 110/43 mmHg (05/13 0015) Pulse Rate: 70 (05/13 0015)  Labs:  Recent Labs  01/12/15 0007 01/12/15 0438 01/12/15 1035 01/12/15 1631 01/13/15 0239  HGB 13.1 12.5*  --   --   --   HCT 38.9* 37.4*  --   --   --   PLT 156 160  --   --   --   APTT  --  32  --   --  64*  LABPROT  --  20.6*  --   --   --   INR  --  1.75*  --   --   --   HEPARINUNFRC  --   --   --   --  0.96*  CREATININE 1.15 1.17  --   --   --   TROPONINI  --  0.04* 0.26* 0.71*  --     Estimated Creatinine Clearance: 59.8 mL/min (by C-G formula based on Cr of 1.17).  Assessment: 74 y.o. male with chest pain, h/o Afib Eliquis on hold, for heparin  Goal of Therapy:  aPTT 66-102 Monitor platelets by anticoagulation protocol: Yes   Plan:  Increase Heparin 1150 units/hr F/U after cath  Caryl Pina 01/13/2015,3:18 AM

## 2015-01-13 NOTE — Telephone Encounter (Signed)
Spoke to patient's daughter Rodena Piety 01/12/15.Note to excuse her from work faxed to # [phone].

## 2015-01-13 NOTE — Progress Notes (Signed)
   SUBJECTIVE:  Feels well.  No bleeding problems at wrist.  Tolerated cath well.   OBJECTIVE:   Vitals:   Filed Vitals:   01/13/15 0755 01/13/15 0757 01/13/15 0802 01/13/15 0822  BP:  137/67 140/70 141/54  Pulse: 79 81 77 67  Temp:    97.6 F (36.4 C)  TempSrc:    Oral  Resp:  20 24 20   Height:      Weight:      SpO2:  96% 96% 95%   I&O's:   Intake/Output Summary (Last 24 hours) at 01/13/15 1014 Last data filed at 01/13/15 1002  Gross per 24 hour  Intake 2647.5 ml  Output   2025 ml  Net  622.5 ml   TELEMETRY: Reviewed telemetry pt in NSR:     PHYSICAL EXAM General: Well developed, well nourished, in no acute distress Head:   Normal cephalic and atramatic  Lungs:   Clear bilaterally to auscultation. Heart:   Irregularly irregular S1 S2  No JVD.   Abdomen: abdomen soft and non-tender Msk:  Back normal,  Normal strength and tone for age. Extremities:   No edema.  TR band in place Neuro: Alert and oriented. Psych:  Normal affect, responds appropriately Skin: No rash   LABS: Basic Metabolic Panel:  Recent Labs  01/12/15 0438 01/13/15 0239  NA 141 139  K 4.0 3.3*  CL 109 108  CO2 24 24  GLUCOSE 150* 129*  BUN 10 11  CREATININE 1.17 1.05  CALCIUM 7.7* 7.9*   Liver Function Tests:  Recent Labs  01/12/15 0007  AST 19  19  ALT 19  20  ALKPHOS 68  72  BILITOT 1.6*  1.7*  PROT 6.3*  6.1*  ALBUMIN 3.3*  3.3*   No results for input(s): LIPASE, AMYLASE in the last 72 hours. CBC:  Recent Labs  01/12/15 0007 01/12/15 0438  WBC 16.5* 19.4*  NEUTROABS 14.6*  --   HGB 13.1 12.5*  HCT 38.9* 37.4*  MCV 87.2 88.2  PLT 156 160   Cardiac Enzymes:  Recent Labs  01/12/15 0438 01/12/15 1035 01/12/15 1631  TROPONINI 0.04* 0.26* 0.71*   BNP: Invalid input(s): POCBNP D-Dimer: No results for input(s): DDIMER in the last 72 hours. Hemoglobin A1C:  Recent Labs  01/12/15 0438  HGBA1C 6.6*   Fasting Lipid Panel: No results for input(s):  CHOL, HDL, LDLCALC, TRIG, CHOLHDL, LDLDIRECT in the last 72 hours. Thyroid Function Tests: No results for input(s): TSH, T4TOTAL, T3FREE, THYROIDAB in the last 72 hours.  Invalid input(s): FREET3 Anemia Panel: No results for input(s): VITAMINB12, FOLATE, FERRITIN, TIBC, IRON, RETICCTPCT in the last 72 hours. Coag Panel:   Lab Results  Component Value Date   INR 1.75* 01/12/2015   INR 1.1 ratio* 01/15/2010    RADIOLOGY: Dg Chest 2 View  01/12/2015   CLINICAL DATA:  Fever. Known Coronary artery disease, managed medically. Hypertension.  EXAM: CHEST  2 VIEW  COMPARISON:  07/13/2013  FINDINGS: The heart size and mediastinal contours are within normal limits. Both lungs are clear. The visualized skeletal structures are unremarkable.  IMPRESSION: No active cardiopulmonary disease.   Electronically Signed   By: Andreas Newport M.D.   On: 01/12/2015 00:23      ASSESSMENT: CAD/ NSTEMI  PLAN:  Likely demand ischemia. Medical therapy since no change in coronary anatomy. Plan for restarting Eliquis tonight.   ABx per internal medicine.  Jettie Booze, MD  01/13/2015  10:14 AM

## 2015-01-13 NOTE — Telephone Encounter (Signed)
Received FMLA paperwork for daughter and wife via fax for Dr Martinique to complete and sign.  Malachy Mood, RN for Dr Martinique gave paperwork to me on 01/12/15 after advising patients wife and daughter the correct procedue to have forms processed.  Daughter and wife to visit office next week to get signed release and pmt to [employer] to complete  lp

## 2015-01-13 NOTE — Progress Notes (Signed)
Jacob Curry:[id number] DOB: [birth year] DOA: 01/11/2015 PCP: Unice Cobble, MD  Brief narrative: 75  Y/o Male known CAD-just saw Dr. Martinique ~ 2 weeks ago 12/28/14 Known h/o CAD with anatomy showing total occlusion left circumflex although good collaterals EF at that time was 50-55% and he has been medically managed-repeat cath 12/2009 = no significant change Also known history of OSA not on CPAP  October 2011, P A. fib-Chad2Vasc2 ~ 3 on Elliquis, well-controlled mixed hyperlipidemia  admitted for sepsis likely from urinary origin lactic acid elevated Pro calcitonin 7 he developed chest pain at around 0 300 and was given morphine/nitroglycerin which relieved it. Chest pain was in the central chest with mild radiation to the neck lasted for about an hour EKG showed dynamic changes across precordium in the anterolateral lateral leads-   Past medical history-As per Problem list Chart reviewed as below- reviewed  Consultants:   Cardiology  Procedures:   Cardiac cath 5/13  Antibiotics:  Vanc 5/12-5/13  Zosyn 5/12    Subjective  Constipated No chest pain no nausea no vomiting No shortness of breath Did not want to eat lunch Tells me that he was bitten by a tick around the time he had the fever    Objective    Interim History:   Telemetry:    Objective: Filed Vitals:   01/13/15 0755 01/13/15 0757 01/13/15 0802 01/13/15 0822  BP:  137/67 140/70 141/54  Pulse: 79 81 77 67  Temp:    97.6 F (36.4 C)  TempSrc:    Oral  Resp:  20 24 20   Height:      Weight:      SpO2:  96% 96% 95%    Intake/Output Summary (Last 24 hours) at 01/13/15 1315 Last data filed at 01/13/15 1002  Gross per 24 hour  Intake 2407.5 ml  Output   1325 ml  Net 1082.5 ml    Exam:  General: Alert oriented in no apparent distress Cardiovascular: S1-S2 no murmur rub or gallop Respiratory: Clinically clear no added sound Abdomen: Soft nontender but slightly distended with stool Skin  no lower extremity edema, cath site seems clean Neuro intact  Data Reviewed: Basic Metabolic Panel:  Recent Labs Lab 01/12/15 0007 01/12/15 0438 01/13/15 0239  NA 136 141 139  K 3.5 4.0 3.3*  CL 105 109 108  CO2 22 24 24   GLUCOSE 140* 150* 129*  BUN 12 10 11   CREATININE 1.15 1.17 1.05  CALCIUM 8.1* 7.7* 7.9*   Liver Function Tests:  Recent Labs Lab 01/12/15 0007  AST 19  19  ALT 19  20  ALKPHOS 68  72  BILITOT 1.6*  1.7*  PROT 6.3*  6.1*  ALBUMIN 3.3*  3.3*   No results for input(s): LIPASE, AMYLASE in the last 168 hours. No results for input(s): AMMONIA in the last 168 hours. CBC:  Recent Labs Lab 01/12/15 0007 01/12/15 0438  WBC 16.5* 19.4*  NEUTROABS 14.6*  --   HGB 13.1 12.5*  HCT 38.9* 37.4*  MCV 87.2 88.2  PLT 156 160   Cardiac Enzymes:  Recent Labs Lab 01/12/15 0438 01/12/15 1035 01/12/15 1631  TROPONINI 0.04* 0.26* 0.71*   BNP: Invalid input(s): POCBNP CBG:  Recent Labs Lab 01/12/15 1224 01/12/15 1805 01/12/15 2123 01/13/15 0628 01/13/15 1235  GLUCAP 70 129* 118* 114* 163*    Recent Results (from the past 240 hour(s))  Culture, blood (routine x 2)     Status: None (Preliminary result)  Collection Time: 01/12/15 12:19 AM  Result Value Ref Range Status   Specimen Description BLOOD RIGHT ARM  Final   Special Requests   Final    BOTTLES DRAWN AEROBIC AND ANAEROBIC BLUE 10CC RED 5CC   Culture   Final           BLOOD CULTURE RECEIVED NO GROWTH TO DATE CULTURE WILL BE HELD FOR 5 DAYS BEFORE ISSUING A FINAL NEGATIVE REPORT Performed at [facility]    Report Status PENDING  Incomplete  Culture, blood (routine x 2)     Status: None (Preliminary result)   Collection Time: 01/12/15 12:26 AM  Result Value Ref Range Status   Specimen Description BLOOD RIGHT HAND  Final   Special Requests BOTTLES DRAWN AEROBIC ONLY 10CC  Final   Culture   Final           BLOOD CULTURE RECEIVED NO GROWTH TO DATE CULTURE WILL BE HELD FOR 5  DAYS BEFORE ISSUING A FINAL NEGATIVE REPORT Performed at [facility]    Report Status PENDING  Incomplete  Urine culture     Status: None (Preliminary result)   Collection Time: 01/12/15 12:52 AM  Result Value Ref Range Status   Specimen Description URINE, CATHETERIZED  Final   Special Requests NONE  Final   Colony Count   Final    >=100,000 COLONIES/ML Performed at [facility]    Culture   Final    [place] Performed at [facility]    Report Status PENDING  Incomplete  MRSA PCR Screening     Status: None   Collection Time: 01/12/15  6:42 AM  Result Value Ref Range Status   MRSA by PCR NEGATIVE NEGATIVE Final    Comment:        The GeneXpert MRSA Assay (FDA approved for NASAL specimens only), is one component of a comprehensive MRSA colonization surveillance program. It is not intended to diagnose MRSA infection nor to guide or monitor treatment for MRSA infections.      Studies:              All Imaging reviewed and is as per above notation   Scheduled Meds: . apixaban  5 mg Oral BID  . docusate sodium  100 mg Oral Daily  . insulin aspart  0-5 Units Subcutaneous QHS  . insulin aspart  0-9 Units Subcutaneous TID WC  . multivitamin with minerals  1 tablet Oral Daily  . omega-3 acid ethyl esters  2 g Oral Daily  . piperacillin-tazobactam (ZOSYN)  IV  3.375 g Intravenous 3 times per day  . simvastatin  40 mg Oral q1800  . sodium chloride  3 mL Intravenous Q12H  . vancomycin  1,000 mg Intravenous Q12H   Continuous Infusions: . sodium chloride 75 mL/hr at 01/13/15 0330     Assessment/Plan: 1. Sepsis-secondary to? Pyelonephritis with gram-negative rods versus tick bite-tick bite unlikely etiology. Narrowed antibiotics from vancomycin to Zosyn monotherapy. Narrow further based on sensitive to report. Repeat CBC plus differential in a.m. Clinically improving significantly. 2. Tick bite-await [place] spotted fever/Borrelia  titers-expectant management at this stage 3. Unstable angina from EKG changes with demand ischemia [sepsis likely caused demand ischemia]-cardiac cath 5/13 was negative for acute process-appreciate cardiology input 4. Mild hypokalemia-repeat labs a.m. 5. Paroxysmal atrial fibrillation-currently in sinus. Continue apixaban 5 mg twice a day-further input per cardiology 6. Hyperlipidemia continue Zocor 40 daily 7. Benign prostatic hypertrophy-about 5 years prior patient had a  procedure done. Patient has been on Flomax in the past. We will restart this. This could be the reason why the patient developed pyelonephritis 8. Constipation added Dulcolax suppository. Consider sorbitol if no effect 9. Diabetes mellitus type 2-continue sliding scale insulin blood sugars 114-163   Code Status: Full code Family Communication: Updated patient's wife fully at the bedside Disposition Plan: Inpatient pending resolution likely 1-2 days   Verneita Griffes, MD  [facility] Pager [phone] 01/13/2015, 1:15 PM    LOS: 1 day

## 2015-01-13 NOTE — Interval H&P Note (Signed)
History and Physical Interval Note:  01/13/2015 7:16 AM  Jacob Curry  has presented today for surgery, with the diagnosis of unstable angina  The various methods of treatment have been discussed with the patient and family. After consideration of risks, benefits and other options for treatment, the patient has consented to  Procedure(s): Left Heart Cath and Coronary Angiography (N/A) as a surgical intervention .  The patient's history has been reviewed, patient examined, no change in status, stable for surgery.  I have reviewed the patient's chart and labs.  Questions were answered to the patient's satisfaction.   Cath Lab Visit (complete for each Cath Lab visit)  Clinical Evaluation Leading to the Procedure:   ACS: Yes.    Non-ACS:    Anginal Classification: CCS III  Anti-ischemic medical therapy: Maximal Therapy (2 or more classes of medications)  Non-Invasive Test Results: No non-invasive testing performed  Prior CABG: No previous CABG        [name] 01/13/2015 7:17 AM

## 2015-01-13 NOTE — H&P (View-Only) (Signed)
CARDIOLOGY CONSULT NOTE       Patient ID: Jacob Curry MRN: [id number] DOB/AGE: Nov 17, 1940 74 y.o.  Admit date: 01/11/2015 Referring Physician:  Verlon Au Primary Physician: Unice Cobble, MD Primary Cardiologist:  [place] Reason for Consultation:  Chest pain  Principal Problem:   Sepsis Active Problems:   Hyperlipidemia   CAD, NATIVE VESSEL   Atrial fibrillation   UTI (lower urinary tract infection)   Hypotension   Chest pain   DM (diabetes mellitus)   Urinary tract infectious disease   HPI:   74 y.o. admitted with fever , rigors, and chills.  Possible urosepsis.  Developed SSCP typical angina relieved with nitro.  ECG non acute Troponin .04.  He has known severe CAD.  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  Also had 90% mid RCA and moderate mid LAD with high grade D1 lesions.  Been Rx medically.  In general has done well at home without angina.  Currently feeling much better with no fever and on antibiotics.  CRF HTN elevated lipids and DM.  Compliant with meds   Review of Systems  Cardiovascular: Negative for claudication.   All other systems reviewed and negative except as noted above  Past Medical History  Diagnosis Date  . CAD (coronary artery disease)     Dr Didier Brandenburg [place]ique  . HTN (hypertension)   . HLD (hyperlipidemia)   . DM (diabetes mellitus)   . ED (erectile dysfunction)   . Carotid stenosis   . Obesity   . OSA (obstructive sleep apnea)     oral appliance, Dr Ron Parker  . Paroxysmal atrial fibrillation     Family History  Problem Relation Age of Onset  . Diabetes Mother   . Colon cancer Paternal Uncle   . Heart attack Father      4 vessel CBAG @ 64  . Diabetes Brother   . Stroke Neg Hx     History   Social History  . Marital Status: Married    Spouse Name: N/A  . Number of Children: N/A  . Years of Education: N/A   Occupational History  . Art gallery manager / Haematologist    Social History Main Topics  .  Smoking status: Former Smoker -- 1.00 packs/day    Quit date: 09/02/1990  . Smokeless tobacco: Never Used     Comment: onset age [age] up to 1 ppd  . Alcohol Use: No  . Drug Use: Not on file  . Sexual Activity: Not on file   Other Topics Concern  . Not on file   Social History Narrative    Past Surgical History  Procedure Laterality Date  . Appendectomy  1958  . Ankle fracture surgery  1993  . Hand surgery  1965  . Tonsillectomy    . Cardiac catheterization  2007 & 2011  . Colonoscopy  2002    negative; Dr Olevia Perches     . apixaban  5 mg Oral BID  . insulin aspart  0-5 Units Subcutaneous QHS  . insulin aspart  0-9 Units Subcutaneous TID WC  . multivitamin with minerals  1 tablet Oral Daily  . omega-3 acid ethyl esters  2 g Oral Daily  . piperacillin-tazobactam (ZOSYN)  IV  3.375 g Intravenous 3 times per day  . simvastatin  40 mg Oral q1800  . vancomycin  1,000 mg Intravenous Q12H   . sodium chloride 75 mL/hr at 01/12/15 0500    Physical Exam: Blood pressure 118/58,  pulse 67, temperature 97.5 F (36.4 C), temperature source Oral, resp. rate 18, height 5\' 7"  (1.702 m), weight 193 lb 12.6 oz (87.9 kg), SpO2 92 %.   Affect appropriate Healthy:  appears stated age 33: normal Neck supple with no adenopathy JVP normal no bruits no thyromegaly Lungs clear with no wheezing and good diaphragmatic motion Heart:  S1/S2 no murmur, no rub, gallop or click PMI normal Abdomen: benighn, BS positve, no tenderness, no AAA no bruit.  No HSM or HJR Distal pulses intact with no bruits No edema Neuro non-focal Skin warm and dry No muscular weakness   Labs:   Lab Results  Component Value Date   WBC 19.4* 01/12/2015   HGB 12.5* 01/12/2015   HCT 37.4* 01/12/2015   MCV 88.2 01/12/2015   PLT 160 01/12/2015    Recent Labs Lab 01/12/15 0007 01/12/15 0438  NA 136 141  K 3.5 4.0  CL 105 109  CO2 22 24  BUN 12 10  CREATININE 1.15 1.17  CALCIUM 8.1* 7.7*  PROT 6.3*  6.1*   --   BILITOT 1.6*  1.7*  --   ALKPHOS 68  72  --   ALT 19  20  --   AST 19  19  --   GLUCOSE 140* 150*   Lab Results  Component Value Date   TROPONINI 0.04* 01/12/2015    Lab Results  Component Value Date   CHOL 97 12/21/2014   CHOL 104 05/05/2014   CHOL 111 11/15/2013   Lab Results  Component Value Date   HDL 42.80 12/21/2014   HDL 39.80 05/05/2014   HDL 45.10 11/15/2013   Lab Results  Component Value Date   LDLCALC 34 12/21/2014   LDLCALC 46 05/05/2014   LDLCALC 39 11/15/2013   Lab Results  Component Value Date   TRIG 101.0 12/21/2014   TRIG 92.0 05/05/2014   TRIG 137.0 11/15/2013   Lab Results  Component Value Date   CHOLHDL 2 12/21/2014   CHOLHDL 3 05/05/2014   CHOLHDL 2 11/15/2013   Lab Results  Component Value Date   LDLDIRECT 39.9 07/22/2008   LDLDIRECT 50.7 03/21/2008      Radiology: Dg Chest 2 View  01/12/2015   CLINICAL DATA:  Fever. Known Coronary artery disease, managed medically. Hypertension.  EXAM: CHEST  2 VIEW  COMPARISON:  07/13/2013  FINDINGS: The heart size and mediastinal contours are within normal limits. Both lungs are clear. The visualized skeletal structures are unremarkable.  IMPRESSION: No active cardiopulmonary disease.   Electronically Signed   By: Andreas Newport M.D.   On: 01/12/2015 00:23    EKG:  NSR no acute changes    ASSESSMENT AND PLAN:  CAD:  Severe 3VD been Rx medically for last 5 years.  Obviously exacerbated by febrile illness Troponin minimally elevated.  Reviewing his cath from 2011 surprised that the diagonal or mid RCA not intervened on.  Worried about progressive disease  As Dr Doug Sou notes indicate myovue will be positive given collaterals and will not help with diagnosing progressive disease.  [place] cath.  Will need to do tomorrow before weekend.  Discussed with patient willing to proceed.  Lab called orders written  PAF:  In NSR will hold eliquis for cath did not receive any today so should be ok for  cath tomorrow afternoon  Sepsis:  WBC elevated  On antibiotics  Hemodynamics stable  Improving   Signed: Jenkins Rouge 01/12/2015, 10:29 AM

## 2015-01-14 DIAGNOSIS — E118 Type 2 diabetes mellitus with unspecified complications: Secondary | ICD-10-CM

## 2015-01-14 DIAGNOSIS — I209 Angina pectoris, unspecified: Secondary | ICD-10-CM

## 2015-01-14 LAB — COMPREHENSIVE METABOLIC PANEL
ALT: 29 U/L (ref 17–63)
AST: 36 U/L (ref 15–41)
Albumin: 2.8 g/dL — ABNORMAL LOW (ref 3.5–5.0)
Alkaline Phosphatase: 88 U/L (ref 38–126)
Anion gap: 7 (ref 5–15)
BUN: 6 mg/dL (ref 6–20)
CO2: 24 mmol/L (ref 22–32)
Calcium: 8.3 mg/dL — ABNORMAL LOW (ref 8.9–10.3)
Chloride: 107 mmol/L (ref 101–111)
Creatinine, Ser: 0.97 mg/dL (ref 0.61–1.24)
GFR calc Af Amer: >60 mL/min (ref >60)
GFR calc non Af Amer: >60 mL/min (ref >60)
Glucose, Bld: 187 mg/dL — ABNORMAL HIGH (ref 65–99)
Potassium: 3.4 mmol/L — ABNORMAL LOW (ref 3.5–5.1)
Sodium: 138 mmol/L (ref 135–145)
Total Bilirubin: 0.6 mg/dL (ref 0.3–1.2)
Total Protein: 6.3 g/dL — ABNORMAL LOW (ref 6.5–8.1)

## 2015-01-14 LAB — CBC WITH DIFFERENTIAL/PLATELET
Basophils Absolute: 0 10*3/uL (ref 0.0–0.1)
Basophils Relative: 0 % (ref 0–1)
Eosinophils Absolute: 0.1 10*3/uL (ref 0.0–0.7)
Eosinophils Relative: 2 % (ref 0–5)
HCT: 36 % — ABNORMAL LOW (ref 39.0–52.0)
Hemoglobin: 12 g/dL — ABNORMAL LOW (ref 13.0–17.0)
Lymphocytes Relative: 11 % — ABNORMAL LOW (ref 12–46)
Lymphs Abs: 0.9 10*3/uL (ref 0.7–4.0)
MCH: 29.1 pg (ref 26.0–34.0)
MCHC: 33.3 g/dL (ref 30.0–36.0)
MCV: 87.2 fL (ref 78.0–100.0)
Monocytes Absolute: 1.1 10*3/uL — ABNORMAL HIGH (ref 0.1–1.0)
Monocytes Relative: 13 % — ABNORMAL HIGH (ref 3–12)
Neutro Abs: 6.6 10*3/uL (ref 1.7–7.7)
Neutrophils Relative %: 74 % (ref 43–77)
Platelets: 135 10*3/uL — ABNORMAL LOW (ref 150–400)
RBC: 4.13 MIL/uL — ABNORMAL LOW (ref 4.22–5.81)
RDW: 13.6 % (ref 11.5–15.5)
WBC: 8.8 10*3/uL (ref 4.0–10.5)

## 2015-01-14 LAB — GLUCOSE, CAPILLARY
Glucose-Capillary: 180 mg/dL — ABNORMAL HIGH (ref 65–99)
Glucose-Capillary: 96 mg/dL (ref 65–99)
Glucose-Capillary: 220 mg/dL — ABNORMAL HIGH (ref 65–99)
Glucose-Capillary: 119 mg/dL — ABNORMAL HIGH (ref 65–99)

## 2015-01-14 LAB — URINE CULTURE: Colony Count: >=100000

## 2015-01-14 LAB — APTT: aPTT: 29 seconds (ref 24–37)

## 2015-01-14 MED ORDER — AMLODIPINE BESYLATE 2.5 MG PO TABS
2.5000 mg | ORAL_TABLET | Freq: Every day | ORAL | Status: DC
  Administered 2015-01-14 – 2015-01-15 (×2): 2.5 mg via ORAL
  Filled 2015-01-14 (×2): qty 1

## 2015-01-14 MED ORDER — CARVEDILOL 6.25 MG PO TABS
18.7500 mg | ORAL_TABLET | Freq: Two times a day (BID) | ORAL | Status: DC
  Administered 2015-01-14 – 2015-01-15 (×2): 18.75 mg via ORAL
  Filled 2015-01-14 (×4): qty 1

## 2015-01-14 MED ORDER — ATORVASTATIN CALCIUM 20 MG PO TABS
20.0000 mg | ORAL_TABLET | Freq: Every day | ORAL | Status: DC
  Administered 2015-01-14: 20 mg via ORAL
  Filled 2015-01-14 (×2): qty 1

## 2015-01-14 MED ORDER — SULFAMETHOXAZOLE-TRIMETHOPRIM 800-160 MG PO TABS
1.0000 | ORAL_TABLET | Freq: Two times a day (BID) | ORAL | Status: DC
  Administered 2015-01-14 – 2015-01-15 (×3): 1 via ORAL
  Filled 2015-01-14 (×4): qty 1

## 2015-01-14 NOTE — Progress Notes (Signed)
Jacob Curry ERX:[id number] DOB: 08/05/1941 DOA: 01/11/2015 PCP: Unice Cobble, MD  Brief narrative: 74  Y/o Male known CAD-just saw Dr. Martinique ~ 2 weeks ago 12/28/14 Known h/o CAD with anatomy showing total occlusion left circumflex although good collaterals EF at that time was 50-55% and he has been medically managed-repeat cath 12/2009 = no significant change Also known history of OSA not on CPAP  October 2011, P A. fib-Chad2Vasc2 ~ 3 on Elliquis, well-controlled mixed hyperlipidemia  admitted for sepsis likely from urinary origin lactic acid elevated Pro calcitonin 7 he developed chest pain at around 0 300 and was given morphine/nitroglycerin which relieved it. Chest pain was in the central chest with mild radiation to the neck lasted for about an hour EKG showed dynamic changes across precordium in the anterolateral lateral leads-cardiology performed a catheterization and patient did not have any acute new findings He had spiking temperatures and ultimately was found to have a Klebsiella pyelonephritis on urine culture His blood cultures have been negative so far   Past medical history-As per Problem list Chart reviewed as below- reviewed  Consultants:   Cardiology  Procedures:   Cardiac cath 5/13  Antibiotics:  Vanc 5/12-5/13  Zosyn 5/12-5/14  Bactrim DS 5/14  [place] spotted fever titers are negative  [place] negative    Subjective    Doing fair. No current fever or chills although had a fever 1 PM 5/13 Feels close to his normal self Tolerating diet No stool    Objective    Interim History:   Telemetry:    Objective: Filed Vitals:   01/14/15 0013 01/14/15 0354 01/14/15 0905 01/14/15 1000  BP: 138/50 144/59 173/62   Pulse: 60 76 73   Temp: 98 F (36.7 C) 97.9 F (36.6 C) 99.1 F (37.3 C) 98.5 F (36.9 C)  TempSrc: Oral Oral Oral   Resp: 17 19 18    Height:      Weight: 91.7 kg (202 lb 2.6 oz)     SpO2: 96% 99%  99%     Intake/Output Summary (Last 24 hours) at 01/14/15 1121 Last data filed at 01/14/15 0905  Gross per 24 hour  Intake   1020 ml  Output    300 ml  Net    720 ml    Exam:  General: Alert oriented in no apparent distress Cardiovascular: S1-S2 no murmur rub or gallop Respiratory: Clinically clear no added sound Abdomen: Soft nless distended than prior Skin no lower extremity edema, cath site seems clean Neuro intact  Data Reviewed: Basic Metabolic Panel:  Recent Labs Lab 01/12/15 0007 01/12/15 0438 01/13/15 0239 01/14/15 0452  NA 136 141 139 138  K 3.5 4.0 3.3* 3.4*  CL 105 109 108 107  CO2 22 24 24 24   GLUCOSE 140* 150* 129* 187*  BUN 12 10 11 6   CREATININE 1.15 1.17 1.05 0.97  CALCIUM 8.1* 7.7* 7.9* 8.3*   Liver Function Tests:  Recent Labs Lab 01/12/15 0007 01/14/15 0452  AST 19  19 36  ALT 19  20 29   ALKPHOS 68  72 88  BILITOT 1.6*  1.7* 0.6  PROT 6.3*  6.1* 6.3*  ALBUMIN 3.3*  3.3* 2.8*   No results for input(s): LIPASE, AMYLASE in the last 168 hours. No results for input(s): AMMONIA in the last 168 hours. CBC:  Recent Labs Lab 01/12/15 0007 01/12/15 0438 01/14/15 0452  WBC 16.5* 19.4* 8.8  NEUTROABS 14.6*  --  6.6  HGB 13.1  12.5* 12.0*  HCT 38.9* 37.4* 36.0*  MCV 87.2 88.2 87.2  PLT 156 160 135*   Cardiac Enzymes:  Recent Labs Lab 01/12/15 0438 01/12/15 1035 01/12/15 1631  TROPONINI 0.04* 0.26* 0.71*   BNP: Invalid input(s): POCBNP CBG:  Recent Labs Lab 01/13/15 0628 01/13/15 1235 01/13/15 1811 01/13/15 2113 01/14/15 0622  GLUCAP 114* 163* 165* 162* 180*    Recent Results (from the past 240 hour(s))  Culture, blood (routine x 2)     Status: None (Preliminary result)   Collection Time: 01/12/15 12:19 AM  Result Value Ref Range Status   Specimen Description BLOOD RIGHT ARM  Final   Special Requests   Final    BOTTLES DRAWN AEROBIC AND ANAEROBIC BLUE 10CC RED 5CC   Culture   Final           BLOOD CULTURE  RECEIVED NO GROWTH TO DATE CULTURE WILL BE HELD FOR 5 DAYS BEFORE ISSUING A FINAL NEGATIVE REPORT Performed at [facility]    Report Status PENDING  Incomplete  Culture, blood (routine x 2)     Status: None (Preliminary result)   Collection Time: 01/12/15 12:26 AM  Result Value Ref Range Status   Specimen Description BLOOD RIGHT HAND  Final   Special Requests BOTTLES DRAWN AEROBIC ONLY 10CC  Final   Culture   Final           BLOOD CULTURE RECEIVED NO GROWTH TO DATE CULTURE WILL BE HELD FOR 5 DAYS BEFORE ISSUING A FINAL NEGATIVE REPORT Performed at [facility]    Report Status PENDING  Incomplete  Urine culture     Status: None   Collection Time: 01/12/15 12:52 AM  Result Value Ref Range Status   Specimen Description URINE, CATHETERIZED  Final   Special Requests NONE  Final   Colony Count   Final    >=100,000 COLONIES/ML Performed at [facility]    Culture   Final    KLEBSIELLA PNEUMONIAE Performed at [facility]    Report Status 01/14/2015 FINAL  Final   Organism ID, Bacteria KLEBSIELLA PNEUMONIAE  Final      Susceptibility   Klebsiella pneumoniae - MIC*    AMPICILLIN >=32 RESISTANT Resistant     CEFAZOLIN <=4 SENSITIVE Sensitive     CEFTRIAXONE <=1 SENSITIVE Sensitive     CIPROFLOXACIN <=0.25 SENSITIVE Sensitive     GENTAMICIN <=1 SENSITIVE Sensitive     LEVOFLOXACIN <=0.12 SENSITIVE Sensitive     NITROFURANTOIN <=16 SENSITIVE Sensitive     TOBRAMYCIN <=1 SENSITIVE Sensitive     TRIMETH/SULFA <=20 SENSITIVE Sensitive     PIP/TAZO <=4 SENSITIVE Sensitive     * KLEBSIELLA PNEUMONIAE  MRSA PCR Screening     Status: None   Collection Time: 01/12/15  6:42 AM  Result Value Ref Range Status   MRSA by PCR NEGATIVE NEGATIVE Final    Comment:        The GeneXpert MRSA Assay (FDA approved for NASAL specimens only), is one component of a comprehensive MRSA colonization surveillance program. It is not intended to diagnose MRSA infection nor  to guide or monitor treatment for MRSA infections.   Culture, blood (routine x 2)     Status: None (Preliminary result)   Collection Time: 01/12/15  4:30 PM  Result Value Ref Range Status   Specimen Description BLOOD RIGHT ANTECUBITAL  Final   Special Requests BOTTLES DRAWN AEROBIC AND ANAEROBIC 10CC  Final   Culture   Final  BLOOD CULTURE RECEIVED NO GROWTH TO DATE CULTURE WILL BE HELD FOR 5 DAYS BEFORE ISSUING A FINAL NEGATIVE REPORT Note: Culture results may be compromised due to an excessive volume of blood received in culture bottles. Performed at [facility]    Report Status PENDING  Incomplete  Culture, blood (routine x 2)     Status: None (Preliminary result)   Collection Time: 01/12/15  4:38 PM  Result Value Ref Range Status   Specimen Description BLOOD RIGHT HAND  Final   Special Requests BOTTLES DRAWN AEROBIC ONLY 10CC  Final   Culture   Final           BLOOD CULTURE RECEIVED NO GROWTH TO DATE CULTURE WILL BE HELD FOR 5 DAYS BEFORE ISSUING A FINAL NEGATIVE REPORT Performed at [facility]    Report Status PENDING  Incomplete     Studies:              All Imaging reviewed and is as per above notation   Scheduled Meds: . amLODipine  2.5 mg Oral Daily  . apixaban  5 mg Oral BID  . atorvastatin  20 mg Oral q1800  . carvedilol  18.75 mg Oral BID WC  . docusate sodium  100 mg Oral Daily  . insulin aspart  0-5 Units Subcutaneous QHS  . insulin aspart  0-9 Units Subcutaneous TID WC  . multivitamin with minerals  1 tablet Oral Daily  . omega-3 acid ethyl esters  2 g Oral Daily  . sodium chloride  3 mL Intravenous Q12H  . sulfamethoxazole-trimethoprim  1 tablet Oral Q12H  . tamsulosin  0.4 mg Oral QPC supper   Continuous Infusions:     Assessment/Plan: 1. Sepsis-secondary to? Pyelonephritis  from [place] bite-tick bite unlikely etiology. Narrowed antibiotics from vancomycin to Zosyn monotherapy-transitioned to Bactrim DS based on  sensitivities-> . Clinically improving significantly.If no fever overnight and discharge  2. Tick bite-await [place] spotted fever/Borrelia titers-expectant management at this stage-titers are negative no further management  3. Unstable angina from EKG changes with demand ischemia [sepsis likely caused demand ischemia]-cardiac cath 5/13 was negative for acute process-appreciate cardiology input-Outpatient follow-up  4. Mild hypokalemia-repeat labs a.m. 5. Paroxysmal atrial fibrillation-currently in sinus. Continue apixaban 5 mg twice a day-further input per cardiology-we have resumed the patient's prior to admission Coreg 18.75 twice a day as well as amlodipine  6. Hyperlipidemia continue Zocor 40 daily 7. Benign prostatic hypertrophy-about 5 years prior patient had a procedure done. Patient has been on Flomax in the past. We will restart this. This could be the reason why the patient developed pyelonephritis 8. Constipation added Dulcolax suppository. Consider sorbitol if no effect 9. Diabetes mellitus type 2-continue sliding scale insulin blood sugars 114-163   Code Status: Full code Family Communication: Updated patient's wife fully at the bedside Disposition Plan: Inpatient pending resolution likely 1-2 days   >35 minutes, greater than which was 50% face-to-face time with patient and family   Verneita Griffes, MD  [facility] Pager [phone] 01/14/2015, 11:21 AM    LOS: 2 days

## 2015-01-15 DIAGNOSIS — I2510 Atherosclerotic heart disease of native coronary artery without angina pectoris: Secondary | ICD-10-CM

## 2015-01-15 LAB — CBC
HCT: 34.8 % — ABNORMAL LOW (ref 39.0–52.0)
Hemoglobin: 11.8 g/dL — ABNORMAL LOW (ref 13.0–17.0)
MCH: 28.8 pg (ref 26.0–34.0)
MCHC: 33.9 g/dL (ref 30.0–36.0)
MCV: 84.9 fL (ref 78.0–100.0)
Platelets: 170 10*3/uL (ref 150–400)
RBC: 4.1 MIL/uL — ABNORMAL LOW (ref 4.22–5.81)
RDW: 13.4 % (ref 11.5–15.5)
WBC: 10.3 10*3/uL (ref 4.0–10.5)

## 2015-01-15 LAB — APTT: aPTT: 37 seconds (ref 24–37)

## 2015-01-15 LAB — GLUCOSE, CAPILLARY
Glucose-Capillary: 128 mg/dL — ABNORMAL HIGH (ref 65–99)
Glucose-Capillary: 181 mg/dL — ABNORMAL HIGH (ref 65–99)

## 2015-01-15 LAB — CLOSTRIDIUM DIFFICILE BY PCR: Toxigenic C. Difficile by PCR: NEGATIVE

## 2015-01-15 MED ORDER — IBUPROFEN 600 MG PO TABS: 600.0000 mg | ORAL_TABLET | Freq: Four times a day (QID) | ORAL | Status: DC | PRN

## 2015-01-15 MED ORDER — ATORVASTATIN CALCIUM 40 MG PO TABS: 20.0000 mg | ORAL_TABLET | Freq: Every day | ORAL | Status: DC

## 2015-01-15 MED ORDER — SULFAMETHOXAZOLE-TRIMETHOPRIM 800-160 MG PO TABS: 1.0000 | ORAL_TABLET | Freq: Two times a day (BID) | ORAL | Status: DC

## 2015-01-15 MED ORDER — TAMSULOSIN HCL 0.4 MG PO CAPS: 0.4000 mg | ORAL_CAPSULE | Freq: Every day | ORAL | Status: DC

## 2015-01-15 NOTE — Discharge Instructions (Signed)

## 2015-01-15 NOTE — Progress Notes (Signed)
Discharge, Pt discharged to home in stable condition, iv DC'd,  discharge instructions reviewed with pt and wife. All questions addressed. 01/15/2015 5:09 PM. Jacob Curry

## 2015-01-15 NOTE — Discharge Summary (Signed)
Physician Discharge Summary  Jacob Curry KZL:[id number] DOB: 1941-08-16 DOA: 01/11/2015  PCP: Unice Cobble, MD  Admit date: 01/11/2015 Discharge date: 01/15/2015  Time spent: 35 minutes  Recommendations for Outpatient Follow-up:  1. Needs to complete course of Bactrim antibiotic 2. Follow-up with urology as an outpatient  Discharge Diagnoses:  Principal Problem:   Sepsis Active Problems:   Hyperlipidemia   CAD, NATIVE VESSEL   Atrial fibrillation   UTI (lower urinary tract infection)   Hypotension   Chest pain   DM (diabetes mellitus)   Urinary tract infectious disease   Discharge Condition: Stable  Diet recommendation: Heart healthy low-salt  Filed Weights   01/12/15 0648 01/13/15 0015 01/14/15 0013  Weight: 87.9 kg (193 lb 12.6 oz) 91.5 kg (201 lb 11.5 oz) 91.7 kg (202 lb 2.6 oz)  Brief narrative: 74  Y/o Male known CAD-just saw Dr. Martinique ~ 2 weeks ago 12/28/14 Known h/o CAD with anatomy showing total occlusion left circumflex although good collaterals EF at that time was 50-55% and he has been medically managed-repeat cath 12/2009 = no significant change Also known history of OSA not on CPAP  October 2011, P A. fib-Chad2Vasc2 ~ 3 on Elliquis, well-controlled mixed hyperlipidemia  admitted for sepsis likely from urinary origin lactic acid elevated Pro calcitonin 7 he developed chest pain at around 0 300 and was given morphine/nitroglycerin which relieved it. Chest pain was in the central chest with mild radiation to the neck lasted for about an hour EKG showed dynamic changes across precordium in the anterolateral lateral leads-cardiology performed a catheterization and patient did not have any acute new findings He had spiking temperatures and ultimately was found to have a Klebsiella pyelonephritis on urine culture His blood cultures have been negative so far See below discussion    Assessment/Plan: 1. Sepsis-secondary to? Pyelonephritis  from [place]  bite unlikely etiology. Narrowed antibiotics from vancomycin to Zosyn monotherapy-transitioned to Bactrim DS based on sensitivities-> . Clinically improving significantly. 2. Tick bite- [facility] spotted fever/Borrelia-titers were negative no further management   3. Unstable angina from EKG changes with demand ischemia [sepsis likely caused demand ischemia]-cardiac cath 5/13 was negative for acute process-appreciate cardiology input-Outpatient follow-up   4. Mild hypokalemia-close to normal on discharge at 3.4 5. Iatrogenic diarrhea secondary to multiple laxatives-C. difficile was negative. Outpatient follow-up for resolution 6. Paroxysmal atrial fibrillation-currently in sinus. Continue apixaban 5 mg twice a day-further input per cardiology-we have resumed the patient's prior to admission Coreg 18.75 twice a day as well as amlodipine   7. Hyperlipidemia continue Zocor 40 daily 8. Benign prostatic hypertrophy-about 5 years prior patient had a procedure done. Patient has been on Flomax in the past. We will restart this. This could be the reason why the patient developed pyelonephritis--might need outpatient neurology input 9. Constipation-see above 10. Diabetes mellitus type 2-continue sliding scale insulin blood sugars 96-181   Cardiology Consultations:   Discharge Exam: Filed Vitals:   01/15/15 1606  BP: 158/65  Pulse: 72  Temp:   Resp:     General: Alert pleasant oriented close to baseline Cardiovascular: S1-S2 no murmur rub or gallop Respiratory: Clinically clear no added sound  Discharge Instructions    Current Discharge Medication List    START taking these medications   Details  atorvastatin (LIPITOR) 40 MG tablet Take 0.5 tablets (20 mg total) by mouth daily at 6 PM. Qty: 30 tablet, Refills: 0    ibuprofen (ADVIL,MOTRIN) 600 MG tablet Take 1 tablet (600 mg total) by  mouth every 6 (six) hours as needed for fever. Qty: 30 tablet, Refills: 0     sulfamethoxazole-trimethoprim (BACTRIM DS,SEPTRA DS) 800-160 MG per tablet Take 1 tablet by mouth every 12 (twelve) hours. Qty: 6 tablet, Refills: 0    tamsulosin (FLOMAX) 0.4 MG CAPS capsule Take 1 capsule (0.4 mg total) by mouth daily after supper. Qty: 30 capsule, Refills: 1      CONTINUE these medications which have NOT CHANGED   Details  amLODipine (NORVASC) 2.5 MG tablet Take 1 tablet (2.5 mg total) by mouth daily. Qty: 30 tablet, Refills: 6    apixaban (ELIQUIS) 5 MG TABS tablet Take 1 tablet (5 mg total) by mouth 2 (two) times daily. Qty: 60 tablet, Refills: 5    carvedilol (COREG) 12.5 MG tablet TAKE 1 AND 1/2 TABLETS TWICE DAILY WITH MEALS Qty: 270 tablet, Refills: 2    fish oil-omega-3 fatty acids 1000 MG capsule Take 2 g by mouth daily.      losartan (COZAAR) 100 MG tablet TAKE 1 TABLET BY MOUTH EVERY DAY Qty: 90 tablet, Refills: 2    metFORMIN (GLUCOPHAGE) 500 MG tablet TAKE ONE TABLET BY MOUTH EVERY MORNING Qty: 90 tablet, Refills: 1    nitroGLYCERIN (NITROSTAT) 0.4 MG SL tablet Place 1 tablet (0.4 mg total) under the tongue every 5 (five) minutes as needed. Qty: 25 tablet, Refills: 11    Saw Palmetto, Serenoa repens, (SAW PALMETTO PO) Take 1 tablet by mouth daily.        STOP taking these medications     Multiple Vitamin (MULTIVITAMIN) tablet      simvastatin (ZOCOR) 40 MG tablet        Allergies  Allergen Reactions  . Acetaminophen     Drug induced hepatitis  . Oxycodone-Acetaminophen     Drug induced hepatitis      The results of significant diagnostics from this hospitalization (including imaging, microbiology, ancillary and laboratory) are listed below for reference.    Significant Diagnostic Studies: Dg Chest 2 View  01/12/2015   CLINICAL DATA:  Fever. Known Coronary artery disease, managed medically. Hypertension.  EXAM: CHEST  2 VIEW  COMPARISON:  07/13/2013  FINDINGS: The heart size and mediastinal contours are within normal limits. Both  lungs are clear. The visualized skeletal structures are unremarkable.  IMPRESSION: No active cardiopulmonary disease.   Electronically Signed   By: Andreas Newport M.D.   On: 01/12/2015 00:23    Microbiology: Recent Results (from the past 240 hour(s))  Culture, blood (routine x 2)     Status: None (Preliminary result)   Collection Time: 01/12/15 12:19 AM  Result Value Ref Range Status   Specimen Description BLOOD RIGHT ARM  Final   Special Requests   Final    BOTTLES DRAWN AEROBIC AND ANAEROBIC BLUE 10CC RED 5CC   Culture   Final           BLOOD CULTURE RECEIVED NO GROWTH TO DATE CULTURE WILL BE HELD FOR 5 DAYS BEFORE ISSUING A FINAL NEGATIVE REPORT Performed at [facility]    Report Status PENDING  Incomplete  Culture, blood (routine x 2)     Status: None (Preliminary result)   Collection Time: 01/12/15 12:26 AM  Result Value Ref Range Status   Specimen Description BLOOD RIGHT HAND  Final   Special Requests BOTTLES DRAWN AEROBIC ONLY 10CC  Final   Culture   Final           BLOOD CULTURE RECEIVED NO GROWTH TO DATE  CULTURE WILL BE HELD FOR 5 DAYS BEFORE ISSUING A FINAL NEGATIVE REPORT Performed at [facility]    Report Status PENDING  Incomplete  Urine culture     Status: None   Collection Time: 01/12/15 12:52 AM  Result Value Ref Range Status   Specimen Description URINE, CATHETERIZED  Final   Special Requests NONE  Final   Colony Count   Final    >=100,000 COLONIES/ML Performed at [facility]    Culture   Final    KLEBSIELLA PNEUMONIAE Performed at [facility]    Report Status 01/14/2015 FINAL  Final   Organism ID, Bacteria KLEBSIELLA PNEUMONIAE  Final      Susceptibility   Klebsiella pneumoniae - MIC*    AMPICILLIN >=32 RESISTANT Resistant     CEFAZOLIN <=4 SENSITIVE Sensitive     CEFTRIAXONE <=1 SENSITIVE Sensitive     CIPROFLOXACIN <=0.25 SENSITIVE Sensitive     GENTAMICIN <=1 SENSITIVE Sensitive     LEVOFLOXACIN <=0.12  SENSITIVE Sensitive     NITROFURANTOIN <=16 SENSITIVE Sensitive     TOBRAMYCIN <=1 SENSITIVE Sensitive     TRIMETH/SULFA <=20 SENSITIVE Sensitive     PIP/TAZO <=4 SENSITIVE Sensitive     * KLEBSIELLA PNEUMONIAE  MRSA PCR Screening     Status: None   Collection Time: 01/12/15  6:42 AM  Result Value Ref Range Status   MRSA by PCR NEGATIVE NEGATIVE Final    Comment:        The GeneXpert MRSA Assay (FDA approved for NASAL specimens only), is one component of a comprehensive MRSA colonization surveillance program. It is not intended to diagnose MRSA infection nor to guide or monitor treatment for MRSA infections.   Culture, blood (routine x 2)     Status: None (Preliminary result)   Collection Time: 01/12/15  4:30 PM  Result Value Ref Range Status   Specimen Description BLOOD RIGHT ANTECUBITAL  Final   Special Requests BOTTLES DRAWN AEROBIC AND ANAEROBIC 10CC  Final   Culture   Final           BLOOD CULTURE RECEIVED NO GROWTH TO DATE CULTURE WILL BE HELD FOR 5 DAYS BEFORE ISSUING A FINAL NEGATIVE REPORT Note: Culture results may be compromised due to an excessive volume of blood received in culture bottles. Performed at [facility]    Report Status PENDING  Incomplete  Culture, blood (routine x 2)     Status: None (Preliminary result)   Collection Time: 01/12/15  4:38 PM  Result Value Ref Range Status   Specimen Description BLOOD RIGHT HAND  Final   Special Requests BOTTLES DRAWN AEROBIC ONLY 10CC  Final   Culture   Final           BLOOD CULTURE RECEIVED NO GROWTH TO DATE CULTURE WILL BE HELD FOR 5 DAYS BEFORE ISSUING A FINAL NEGATIVE REPORT Performed at [facility]    Report Status PENDING  Incomplete  Culture, blood (single)     Status: None (Preliminary result)   Collection Time: 01/13/15  6:28 PM  Result Value Ref Range Status   Specimen Description BLOOD LEFT ARM  Final   Special Requests BOTTLES DRAWN AEROBIC AND ANAEROBIC 10CC  Final   Culture    Final           BLOOD CULTURE RECEIVED NO GROWTH TO DATE CULTURE WILL BE HELD FOR 5 DAYS BEFORE ISSUING A FINAL NEGATIVE REPORT Note: Culture results may be compromised due to an  excessive volume of blood received in culture bottles. Performed at [facility]    Report Status PENDING  Incomplete  Clostridium Difficile by PCR     Status: None   Collection Time: 01/15/15  9:29 AM  Result Value Ref Range Status   C difficile by pcr NEGATIVE NEGATIVE Final     Labs: Basic Metabolic Panel:  Recent Labs Lab 01/12/15 0007 01/12/15 0438 01/13/15 0239 01/14/15 0452  NA 136 141 139 138  K 3.5 4.0 3.3* 3.4*  CL 105 109 108 107  CO2 22 24 24 24   GLUCOSE 140* 150* 129* 187*  BUN 12 10 11 6   CREATININE 1.15 1.17 1.05 0.97  CALCIUM 8.1* 7.7* 7.9* 8.3*   Liver Function Tests:  Recent Labs Lab 01/12/15 0007 01/14/15 0452  AST 19  19 36  ALT 19  20 29   ALKPHOS 68  72 88  BILITOT 1.6*  1.7* 0.6  PROT 6.3*  6.1* 6.3*  ALBUMIN 3.3*  3.3* 2.8*   No results for input(s): LIPASE, AMYLASE in the last 168 hours. No results for input(s): AMMONIA in the last 168 hours. CBC:  Recent Labs Lab 01/12/15 0007 01/12/15 0438 01/14/15 0452 01/15/15 0635  WBC 16.5* 19.4* 8.8 10.3  NEUTROABS 14.6*  --  6.6  --   HGB 13.1 12.5* 12.0* 11.8*  HCT 38.9* 37.4* 36.0* 34.8*  MCV 87.2 88.2 87.2 84.9  PLT 156 160 135* 170   Cardiac Enzymes:  Recent Labs Lab 01/12/15 0438 01/12/15 1035 01/12/15 1631  TROPONINI 0.04* 0.26* 0.71*   BNP: BNP (last 3 results) No results for input(s): BNP in the last 8760 hours.  ProBNP (last 3 results) No results for input(s): PROBNP in the last 8760 hours.  CBG:  Recent Labs Lab 01/14/15 1158 01/14/15 1733 01/14/15 2110 01/15/15 0756 01/15/15 1232  GLUCAP 96 220* 119* 128* 181*       Signed:  Nita Sells  [facility] 01/15/2015, 4:15 PM

## 2015-01-16 ENCOUNTER — Telehealth: Payer: Self-pay | Admitting: *Deleted

## 2015-01-16 ENCOUNTER — Telehealth: Payer: Self-pay | Admitting: Cardiology

## 2015-01-16 MED FILL — Lidocaine HCl Local Preservative Free (PF) Inj 1%: INTRAMUSCULAR | Qty: 30 | Status: AC

## 2015-01-16 MED FILL — Heparin Sodium (Porcine) 2 Unit/ML in Sodium Chloride 0.9%: INTRAMUSCULAR | Qty: 1500 | Status: AC

## 2015-01-16 NOTE — Telephone Encounter (Signed)
Pt's wife called in wanting to speak with Dr. Martinique or his nurse about a medication that the pt was prescribed while at the hospital. Please call  Thanks

## 2015-01-16 NOTE — Telephone Encounter (Signed)
Transition Care Management Follow-up Telephone Call D/C 01/15/15  How have you been since you were released from the hospital? Pt states he feel about the same.Still having the same problem been trying to get appt with neurologist but having a hard time getting through   Do you understand why you were in the hospital? YES   Do you understand the discharge instrcutions? YES  Items Reviewed:  Medications reviewed: YES  Allergies reviewed: YES  Dietary changes reviewed: YES  Referrals reviewed: No referral needed   Functional Questionnaire:   Activities of Daily Living (ADLs):   He states he are independent in the following: ambulation, bathing and hygiene, feeding, continence, grooming, toileting and dressing States he doesn't require assistance    Any transportation issues/concerns?: NO   Any patient concerns? NO   Confirmed importance and date/time of follow-up visits scheduled: YES, made appt 01/19/15 w/Dr. Linna Darner   Confirmed with patient if condition begins to worsen call PCP or go to the ER.

## 2015-01-16 NOTE — Telephone Encounter (Signed)
Returned call to patient's wife.She stated she had several questions.#1 he has a new bottle of simvastatin 40 mg,they gave him lipitor 40 mg at hospital #2 he was prescribed flomax he use to take but she can't remember why it was stopped #3 needs post hospital appointment #4 when can he restart cardiac rehab.Spoke to [place] he advised can take simvastatin 40 mg daily and not lipitor.Ok to take flomax monitor B/P can cause low B/P.Post hospital appointment scheduled with Dr.Jordan 02/06/15 at 4:30 pm.Ok to restart cardiac rehab after he gets over urinary infection.I sent Barnet Pall RN in cardiac rehab a message.

## 2015-01-17 ENCOUNTER — Encounter (HOSPITAL_COMMUNITY): Payer: Self-pay

## 2015-01-17 DIAGNOSIS — R339 Retention of urine, unspecified: Secondary | ICD-10-CM | POA: Diagnosis not present

## 2015-01-17 DIAGNOSIS — R6883 Chills (without fever): Secondary | ICD-10-CM | POA: Diagnosis not present

## 2015-01-17 DIAGNOSIS — A414 Sepsis due to anaerobes: Secondary | ICD-10-CM | POA: Diagnosis not present

## 2015-01-18 ENCOUNTER — Encounter: Payer: Self-pay | Admitting: Internal Medicine

## 2015-01-18 ENCOUNTER — Telehealth: Payer: Self-pay | Admitting: Internal Medicine

## 2015-01-18 ENCOUNTER — Ambulatory Visit (INDEPENDENT_AMBULATORY_CARE_PROVIDER_SITE_OTHER): Payer: Medicare Other | Admitting: Internal Medicine

## 2015-01-18 VITALS — BP 126/52 | HR 71 | Temp 98.0°F | Wt 187.5 lb

## 2015-01-18 DIAGNOSIS — A419 Sepsis, unspecified organism: Secondary | ICD-10-CM | POA: Diagnosis not present

## 2015-01-18 DIAGNOSIS — R195 Other fecal abnormalities: Secondary | ICD-10-CM

## 2015-01-18 DIAGNOSIS — E119 Type 2 diabetes mellitus without complications: Secondary | ICD-10-CM | POA: Diagnosis not present

## 2015-01-18 DIAGNOSIS — N390 Urinary tract infection, site not specified: Secondary | ICD-10-CM

## 2015-01-18 LAB — CULTURE, BLOOD (ROUTINE X 2)
Culture: NO GROWTH
Culture: NO GROWTH

## 2015-01-18 NOTE — Patient Instructions (Signed)
Please take a probiotic , Florastor OR Align, every day if the bowels are loose. This will replace the normal bacteria which  are necessary for formation of normal stool and processing of food. The following nutritional changes may help prevent Diabetes progression & complications.  White carbohydrates (potatoes, rice, bread, and pasta) cause a high spike of the sugar level which stays elevated for a significant period of time (called sugar"load").  For example a  baked potato has a cup of sugar and a  french fry  2 teaspoons of sugar.  More complex carbs such as yams, wild  rice, whole grained bread &  wheat pasta have been much lower spike and persistent load of sugar than the white carbs. The pancreas excretes excess insulin in response to the high spike & load of sugar . Over time the pancreas can actually run out of insulin necessitating insulin shots.

## 2015-01-18 NOTE — Telephone Encounter (Signed)
Patient was admitted to [facility] main, [facility] has culture results that hospital will not take. They want to know if we will accept the results. Please call them to advise.

## 2015-01-18 NOTE — Progress Notes (Signed)
   Subjective:    Patient ID: Jacob Curry, male    DOB: [birth year], 74 y.o.   MRN: [id number]  HPI  He was hospitalized 5/11-5/15/16 with urosepsis. He had a documented Klebsiella urinary tract infection for which he was on sulfa until today. Since discharge he continued to have intermittent fever. Temperature was 102.3 yesterday; he took one dose of nonsteroidals with resolution. He had been bitten by ticks 5/1 and 5/10. [facility] spotted fever and Lyme titers were negative during hospitalization.  Hospital course was complicated by unstable angina. This was attributed to demand ischemia. Catheterization revealed no acute process  He was seen in the Urology clinic yesterday. His glucose was 244 that time. A1c was 6.6% on 01/12/15. He is not monitoring glucoses at home.  He has obstructive sleep apnea by history but is not on CPAP.   Review of Systems Dysuria, pyuria, hematuria, frequency, nocturia or polyuria are denied.  He's had loose but not diarrheal stool with urination.  He is having fatigue; nausea without vomiting; and some anorexia.      Objective:   Physical Exam  General appearance :adequately nourished; in no distress.  Eyes: No conjunctival inflammation or scleral icterus is present.  Oral exam:  Lips and gums are healthy appearing.There is no oropharyngeal erythema or exudate noted. Dental hygiene is good.  Heart:  Normal rate and regular rhythm. S1 and S2 normal without gallop, click, rub or other extra sounds  .Grade 6/3-3/3 systolic murmur  Lungs:Chest clear to auscultation; no wheezes, rhonchi,rales ,or rubs present.No increased work of breathing.   Abdomen: Protuberant; bowel sounds normal, soft and non-tender without masses, organomegaly or hernias noted.  No guarding or rebound. No flank tenderness to percussion.  Vascular : all pulses equal but dorsalis pedis pulses are decreased ; no bruits present.  Skin:Warm & dry.  Intact without suspicious  lesions or rashes ; no tenting or jaundice   Lymphatic: No lymphadenopathy is noted about the head, neck, axilla  Neuro: Strength, tone & DTRs normal.       Assessment & Plan:  See Current Assessment & Plan in Problem List under specific Diagnosis

## 2015-01-18 NOTE — Telephone Encounter (Signed)
Pt called in and said that they did a culture yesterday at [facility]   He stated Hop would know just tell him this.

## 2015-01-18 NOTE — Progress Notes (Signed)
Pre visit review using our clinic review tool, if applicable. No additional management support is needed unless otherwise documented below in the visit note. 

## 2015-01-19 ENCOUNTER — Inpatient Hospital Stay: Payer: Medicare Other | Admitting: Internal Medicine

## 2015-01-19 ENCOUNTER — Emergency Department (HOSPITAL_COMMUNITY): Payer: Medicare Other

## 2015-01-19 ENCOUNTER — Inpatient Hospital Stay (HOSPITAL_COMMUNITY)
Admission: EM | Admit: 2015-01-19 | Discharge: 2015-01-22 | DRG: 871 | Disposition: A | Discharge status: Home / Self Care | Payer: Medicare Other | Attending: Internal Medicine | Admitting: Internal Medicine

## 2015-01-19 ENCOUNTER — Encounter (HOSPITAL_COMMUNITY): Payer: Self-pay

## 2015-01-19 ENCOUNTER — Encounter (HOSPITAL_COMMUNITY): Payer: Self-pay | Admitting: Emergency Medicine

## 2015-01-19 ENCOUNTER — Telehealth: Payer: Self-pay

## 2015-01-19 DIAGNOSIS — R509 Fever, unspecified: Secondary | ICD-10-CM | POA: Insufficient documentation

## 2015-01-19 DIAGNOSIS — G4733 Obstructive sleep apnea (adult) (pediatric): Secondary | ICD-10-CM | POA: Diagnosis present

## 2015-01-19 DIAGNOSIS — I10 Essential (primary) hypertension: Secondary | ICD-10-CM | POA: Diagnosis not present

## 2015-01-19 DIAGNOSIS — J449 Chronic obstructive pulmonary disease, unspecified: Secondary | ICD-10-CM | POA: Diagnosis not present

## 2015-01-19 DIAGNOSIS — Z87891 Personal history of nicotine dependence: Secondary | ICD-10-CM | POA: Diagnosis not present

## 2015-01-19 DIAGNOSIS — I481 Persistent atrial fibrillation: Secondary | ICD-10-CM | POA: Diagnosis not present

## 2015-01-19 DIAGNOSIS — I2510 Atherosclerotic heart disease of native coronary artery without angina pectoris: Secondary | ICD-10-CM | POA: Diagnosis not present

## 2015-01-19 DIAGNOSIS — F528 Other sexual dysfunction not due to a substance or known physiological condition: Secondary | ICD-10-CM | POA: Diagnosis present

## 2015-01-19 DIAGNOSIS — N179 Acute kidney failure, unspecified: Secondary | ICD-10-CM | POA: Diagnosis not present

## 2015-01-19 DIAGNOSIS — Z833 Family history of diabetes mellitus: Secondary | ICD-10-CM | POA: Diagnosis not present

## 2015-01-19 DIAGNOSIS — Z809 Family history of malignant neoplasm, unspecified: Secondary | ICD-10-CM | POA: Diagnosis not present

## 2015-01-19 DIAGNOSIS — E1159 Type 2 diabetes mellitus with other circulatory complications: Secondary | ICD-10-CM | POA: Insufficient documentation

## 2015-01-19 DIAGNOSIS — E860 Dehydration: Secondary | ICD-10-CM | POA: Diagnosis present

## 2015-01-19 DIAGNOSIS — J189 Pneumonia, unspecified organism: Secondary | ICD-10-CM | POA: Diagnosis present

## 2015-01-19 DIAGNOSIS — E119 Type 2 diabetes mellitus without complications: Secondary | ICD-10-CM | POA: Diagnosis present

## 2015-01-19 DIAGNOSIS — Y95 Nosocomial condition: Secondary | ICD-10-CM | POA: Diagnosis present

## 2015-01-19 DIAGNOSIS — J90 Pleural effusion, not elsewhere classified: Secondary | ICD-10-CM | POA: Diagnosis not present

## 2015-01-19 DIAGNOSIS — E785 Hyperlipidemia, unspecified: Secondary | ICD-10-CM | POA: Diagnosis present

## 2015-01-19 DIAGNOSIS — I480 Paroxysmal atrial fibrillation: Secondary | ICD-10-CM | POA: Diagnosis present

## 2015-01-19 DIAGNOSIS — N529 Male erectile dysfunction, unspecified: Secondary | ICD-10-CM | POA: Diagnosis present

## 2015-01-19 DIAGNOSIS — Z8249 Family history of ischemic heart disease and other diseases of the circulatory system: Secondary | ICD-10-CM | POA: Diagnosis not present

## 2015-01-19 DIAGNOSIS — I252 Old myocardial infarction: Secondary | ICD-10-CM | POA: Diagnosis not present

## 2015-01-19 DIAGNOSIS — A419 Sepsis, unspecified organism: Principal | ICD-10-CM | POA: Diagnosis present

## 2015-01-19 DIAGNOSIS — R911 Solitary pulmonary nodule: Secondary | ICD-10-CM

## 2015-01-19 DIAGNOSIS — I4891 Unspecified atrial fibrillation: Secondary | ICD-10-CM | POA: Diagnosis present

## 2015-01-19 LAB — CBC WITH DIFFERENTIAL/PLATELET
Basophils Absolute: 0 10*3/uL (ref 0.0–0.1)
Basophils Relative: 0 % (ref 0–1)
Eosinophils Absolute: 0 10*3/uL (ref 0.0–0.7)
Eosinophils Relative: 0 % (ref 0–5)
HCT: 36.8 % — ABNORMAL LOW (ref 39.0–52.0)
Hemoglobin: 12.4 g/dL — ABNORMAL LOW (ref 13.0–17.0)
Lymphocytes Relative: 3 % — ABNORMAL LOW (ref 12–46)
Lymphs Abs: 0.5 10*3/uL — ABNORMAL LOW (ref 0.7–4.0)
MCH: 28.6 pg (ref 26.0–34.0)
MCHC: 33.7 g/dL (ref 30.0–36.0)
MCV: 84.8 fL (ref 78.0–100.0)
Monocytes Absolute: 0.4 10*3/uL (ref 0.1–1.0)
Monocytes Relative: 2 % — ABNORMAL LOW (ref 3–12)
Neutro Abs: 17.4 10*3/uL — ABNORMAL HIGH (ref 1.7–7.7)
Neutrophils Relative %: 95 % — ABNORMAL HIGH (ref 43–77)
Platelets: 221 10*3/uL (ref 150–400)
RBC: 4.34 MIL/uL (ref 4.22–5.81)
RDW: 13.4 % (ref 11.5–15.5)
WBC Morphology: INCREASED
WBC: 18.3 10*3/uL — ABNORMAL HIGH (ref 4.0–10.5)

## 2015-01-19 LAB — CULTURE, BLOOD (ROUTINE X 2): Culture: NO GROWTH

## 2015-01-19 LAB — COMPREHENSIVE METABOLIC PANEL
ALT: 65 U/L — ABNORMAL HIGH (ref 17–63)
AST: 47 U/L — ABNORMAL HIGH (ref 15–41)
Albumin: 3.1 g/dL — ABNORMAL LOW (ref 3.5–5.0)
Alkaline Phosphatase: 165 U/L — ABNORMAL HIGH (ref 38–126)
Anion gap: 11 (ref 5–15)
BUN: 16 mg/dL (ref 6–20)
CO2: 20 mmol/L — ABNORMAL LOW (ref 22–32)
Calcium: 8.7 mg/dL — ABNORMAL LOW (ref 8.9–10.3)
Chloride: 102 mmol/L (ref 101–111)
Creatinine, Ser: 1.33 mg/dL — ABNORMAL HIGH (ref 0.61–1.24)
GFR calc Af Amer: 59 mL/min — ABNORMAL LOW (ref >60)
GFR calc non Af Amer: 51 mL/min — ABNORMAL LOW (ref >60)
Glucose, Bld: 197 mg/dL — ABNORMAL HIGH (ref 65–99)
Potassium: 3.6 mmol/L (ref 3.5–5.1)
Sodium: 133 mmol/L — ABNORMAL LOW (ref 135–145)
Total Bilirubin: 0.5 mg/dL (ref 0.3–1.2)
Total Protein: 6.5 g/dL (ref 6.5–8.1)

## 2015-01-19 LAB — I-STAT CG4 LACTIC ACID, ED: Lactic Acid, Venous: 1.6 mmol/L (ref 0.5–2.0)

## 2015-01-19 LAB — URINALYSIS, ROUTINE W REFLEX MICROSCOPIC
Bilirubin Urine: NEGATIVE
Glucose, UA: NEGATIVE mg/dL
Hgb urine dipstick: NEGATIVE
Ketones, ur: NEGATIVE mg/dL
Leukocytes, UA: NEGATIVE
Nitrite: NEGATIVE
Protein, ur: NEGATIVE mg/dL
Specific Gravity, Urine: 1.01 (ref 1.005–1.030)
Urobilinogen, UA: 0.2 mg/dL (ref 0.0–1.0)
pH: 5 (ref 5.0–8.0)

## 2015-01-19 LAB — GLUCOSE, CAPILLARY
Glucose-Capillary: 170 mg/dL — ABNORMAL HIGH (ref 65–99)
Glucose-Capillary: 161 mg/dL — ABNORMAL HIGH (ref 65–99)
Glucose-Capillary: 120 mg/dL — ABNORMAL HIGH (ref 65–99)
Glucose-Capillary: 115 mg/dL — ABNORMAL HIGH (ref 65–99)

## 2015-01-19 LAB — STREP PNEUMONIAE URINARY ANTIGEN: Strep Pneumo Urinary Antigen: NEGATIVE

## 2015-01-19 MED ORDER — VANCOMYCIN HCL 10 G IV SOLR
1500.0000 mg | Freq: Once | INTRAVENOUS | Status: AC
  Administered 2015-01-19: 1500 mg via INTRAVENOUS
  Filled 2015-01-19: qty 1500

## 2015-01-19 MED ORDER — SODIUM CHLORIDE 0.9 % IV SOLN
INTRAVENOUS | Status: DC
  Administered 2015-01-19 – 2015-01-22 (×5): via INTRAVENOUS

## 2015-01-19 MED ORDER — PIPERACILLIN-TAZOBACTAM 4.5 G IVPB: 4.5000 g | Freq: Once | INTRAVENOUS | Status: DC

## 2015-01-19 MED ORDER — SAW PALMETTO 80 MG PO CAPS: 1.0000 | ORAL_CAPSULE | Freq: Every day | ORAL | Status: DC

## 2015-01-19 MED ORDER — ALBUTEROL SULFATE (2.5 MG/3ML) 0.083% IN NEBU
2.5000 mg | INHALATION_SOLUTION | RESPIRATORY_TRACT | Status: DC | PRN
Start: 2015-01-19 — End: 2015-01-22

## 2015-01-19 MED ORDER — AMLODIPINE BESYLATE 2.5 MG PO TABS
2.5000 mg | ORAL_TABLET | Freq: Every day | ORAL | Status: DC
  Administered 2015-01-19 – 2015-01-22 (×4): 2.5 mg via ORAL
  Filled 2015-01-19 (×4): qty 1

## 2015-01-19 MED ORDER — SODIUM CHLORIDE 0.9 % IV BOLUS (SEPSIS)
1000.0000 mL | Freq: Once | INTRAVENOUS | Status: AC
  Administered 2015-01-19: 1000 mL via INTRAVENOUS

## 2015-01-19 MED ORDER — NITROGLYCERIN 0.4 MG SL SUBL: 0.4000 mg | SUBLINGUAL_TABLET | SUBLINGUAL | Status: DC | PRN

## 2015-01-19 MED ORDER — TAMSULOSIN HCL 0.4 MG PO CAPS
0.4000 mg | ORAL_CAPSULE | Freq: Every day | ORAL | Status: DC
  Administered 2015-01-19 – 2015-01-20 (×2): 0.4 mg via ORAL
  Filled 2015-01-19 (×4): qty 1

## 2015-01-19 MED ORDER — OMEGA-3-ACID ETHYL ESTERS 1 G PO CAPS
2.0000 g | ORAL_CAPSULE | Freq: Every day | ORAL | Status: DC
  Administered 2015-01-19 – 2015-01-22 (×4): 2 g via ORAL
  Filled 2015-01-19 (×4): qty 2

## 2015-01-19 MED ORDER — PIPERACILLIN-TAZOBACTAM 3.375 G IVPB 30 MIN
3.3750 g | Freq: Once | INTRAVENOUS | Status: DC
  Filled 2015-01-19: qty 50

## 2015-01-19 MED ORDER — DEXTROSE 5 % IV SOLN
2.0000 g | Freq: Two times a day (BID) | INTRAVENOUS | Status: DC
  Administered 2015-01-19 – 2015-01-22 (×7): 2 g via INTRAVENOUS
  Filled 2015-01-19 (×9): qty 2

## 2015-01-19 MED ORDER — APIXABAN 5 MG PO TABS
5.0000 mg | ORAL_TABLET | Freq: Two times a day (BID) | ORAL | Status: DC
  Administered 2015-01-19 – 2015-01-22 (×7): 5 mg via ORAL
  Filled 2015-01-19 (×8): qty 1

## 2015-01-19 MED ORDER — CARVEDILOL 6.25 MG PO TABS
18.7500 mg | ORAL_TABLET | Freq: Two times a day (BID) | ORAL | Status: DC
  Administered 2015-01-19 (×2): 18.75 mg via ORAL
  Filled 2015-01-19 (×3): qty 1

## 2015-01-19 MED ORDER — IBUPROFEN 600 MG PO TABS
600.0000 mg | ORAL_TABLET | Freq: Once | ORAL | Status: AC | PRN
  Filled 2015-01-19: qty 1

## 2015-01-19 MED ORDER — DEXTROSE 5 % IV SOLN: 1.0000 g | Freq: Three times a day (TID) | INTRAVENOUS | Status: DC

## 2015-01-19 MED ORDER — VANCOMYCIN HCL IN DEXTROSE 750-5 MG/150ML-% IV SOLN
750.0000 mg | Freq: Two times a day (BID) | INTRAVENOUS | Status: DC
  Administered 2015-01-19 – 2015-01-20 (×2): 750 mg via INTRAVENOUS
  Filled 2015-01-19 (×3): qty 150

## 2015-01-19 MED ORDER — CARVEDILOL 6.25 MG PO TABS
18.7500 mg | ORAL_TABLET | Freq: Two times a day (BID) | ORAL | Status: DC
  Administered 2015-01-20 – 2015-01-22 (×5): 18.75 mg via ORAL
  Filled 2015-01-19 (×7): qty 1

## 2015-01-19 MED ORDER — SIMVASTATIN 40 MG PO TABS
40.0000 mg | ORAL_TABLET | Freq: Every day | ORAL | Status: DC
  Administered 2015-01-19: 40 mg via ORAL
  Filled 2015-01-19 (×2): qty 1

## 2015-01-19 MED ORDER — INSULIN ASPART 100 UNIT/ML ~~LOC~~ SOLN
0.0000 [IU] | Freq: Three times a day (TID) | SUBCUTANEOUS | Status: DC
  Administered 2015-01-19 – 2015-01-20 (×2): 2 [IU] via SUBCUTANEOUS
  Administered 2015-01-20 – 2015-01-22 (×4): 1 [IU] via SUBCUTANEOUS
  Administered 2015-01-22: 2 [IU] via SUBCUTANEOUS

## 2015-01-19 NOTE — Telephone Encounter (Signed)
The Xrays are not diagnostic; can not tell if early pneumonia or fluid in lungs. It will take follow up Xrays after treatment to make definite diagnosis.

## 2015-01-19 NOTE — Progress Notes (Signed)
Wife is very concern with patient's temperature and is crying. Last temp is 99.1 orally. Feels as though staff is not tending to patient's needs as best as we possibly can. Feels as though she can no leave his side. Wants pt's temp to be monitored q18mins. Patient is shivering and only complaint is that he is cold. Will report off to receiving RN.

## 2015-01-19 NOTE — ED Notes (Signed)
Pt. reports fever last night 103.7 with chills / fatigue and diarrhea .

## 2015-01-19 NOTE — Telephone Encounter (Signed)
Patient's wife has called in to let you know that she had to carry patient to ED at 1 am this morning with 103.7 fever---doctor at hospital is saying he thinks it is pneumonia---she is wanting to know what you think and if there's anything else she should be doing----please advise, i will call her back, thanks----wife's cell phone is [phone]

## 2015-01-19 NOTE — Assessment & Plan Note (Addendum)
Repeat C&S collected @ Urology 5/17

## 2015-01-19 NOTE — Progress Notes (Signed)
ANTIBIOTIC CONSULT NOTE - INITIAL  Pharmacy Consult for Cefepime and vancomycin Indication: Sepsis/ Pneumonia  Allergies  Allergen Reactions  . Acetaminophen     Drug induced hepatitis  . Oxycodone-Acetaminophen     Drug induced hepatitis    Patient Measurements: Height: 5\' 7"  (170.2 cm) Weight: 184 lb (83.462 kg) IBW/kg (Calculated) : 66.1   Vital Signs: Temp: 98.3 F (36.8 C) (05/19 0151) Temp Source: Oral (05/19 0151) BP: 109/45 mmHg (05/19 0400) Pulse Rate: 78 (05/19 0400) Intake/Output from previous day:   Intake/Output from this shift:    Labs:  Recent Labs  01/19/15 0207  WBC 18.3*  HGB 12.4*  PLT 221  CREATININE 1.33*   Estimated Creatinine Clearance: 50.4 mL/min (by C-G formula based on Cr of 1.33). No results for input(s): VANCOTROUGH, VANCOPEAK, VANCORANDOM, GENTTROUGH, GENTPEAK, GENTRANDOM, TOBRATROUGH, TOBRAPEAK, TOBRARND, AMIKACINPEAK, AMIKACINTROU, AMIKACIN in the last 72 hours.   Microbiology: Recent Results (from the past 720 hour(s))  Culture, blood (routine x 2)     Status: None   Collection Time: 01/12/15 12:19 AM  Result Value Ref Range Status   Specimen Description BLOOD RIGHT ARM  Final   Special Requests   Final    BOTTLES DRAWN AEROBIC AND ANAEROBIC BLUE 10CC RED 5CC   Culture   Final    NO GROWTH 5 DAYS Note: Culture results may be compromised due to an excessive volume of blood received in culture bottles. Performed at [facility]    Report Status 01/18/2015 FINAL  Final  Culture, blood (routine x 2)     Status: None   Collection Time: 01/12/15 12:26 AM  Result Value Ref Range Status   Specimen Description BLOOD RIGHT HAND  Final   Special Requests BOTTLES DRAWN AEROBIC ONLY 10CC  Final   Culture   Final    NO GROWTH 5 DAYS Performed at [facility]    Report Status 01/18/2015 FINAL  Final  Urine culture     Status: None   Collection Time: 01/12/15 12:52 AM  Result Value Ref Range Status   Specimen  Description URINE, CATHETERIZED  Final   Special Requests NONE  Final   Colony Count   Final    >=100,000 COLONIES/ML Performed at [facility]    Culture   Final    KLEBSIELLA PNEUMONIAE Performed at [facility]    Report Status 01/14/2015 FINAL  Final   Organism ID, Bacteria KLEBSIELLA PNEUMONIAE  Final      Susceptibility   Klebsiella pneumoniae - MIC*    AMPICILLIN >=32 RESISTANT Resistant     CEFAZOLIN <=4 SENSITIVE Sensitive     CEFTRIAXONE <=1 SENSITIVE Sensitive     CIPROFLOXACIN <=0.25 SENSITIVE Sensitive     GENTAMICIN <=1 SENSITIVE Sensitive     LEVOFLOXACIN <=0.12 SENSITIVE Sensitive     NITROFURANTOIN <=16 SENSITIVE Sensitive     TOBRAMYCIN <=1 SENSITIVE Sensitive     TRIMETH/SULFA <=20 SENSITIVE Sensitive     PIP/TAZO <=4 SENSITIVE Sensitive     * KLEBSIELLA PNEUMONIAE  MRSA PCR Screening     Status: None   Collection Time: 01/12/15  6:42 AM  Result Value Ref Range Status   MRSA by PCR NEGATIVE NEGATIVE Final    Comment:        The GeneXpert MRSA Assay (FDA approved for NASAL specimens only), is one component of a comprehensive MRSA colonization surveillance program. It is not intended to diagnose MRSA infection nor to guide or monitor treatment for MRSA infections.  Culture, blood (routine x 2)     Status: None (Preliminary result)   Collection Time: 01/12/15  4:30 PM  Result Value Ref Range Status   Specimen Description BLOOD RIGHT ANTECUBITAL  Final   Special Requests BOTTLES DRAWN AEROBIC AND ANAEROBIC 10CC  Final   Culture   Final    GRAM NEGATIVE RODS Note: Culture results may be compromised due to an excessive volume of blood received in culture bottles. Gram Stain Report Called to,Read Back By and Verified With: Su Hilt 01/18/15 0845 BY COOKV Performed at [facility]    Report Status PENDING  Incomplete  Culture, blood (routine x 2)     Status: None (Preliminary result)   Collection Time: 01/12/15  4:38 PM   Result Value Ref Range Status   Specimen Description BLOOD RIGHT HAND  Final   Special Requests BOTTLES DRAWN AEROBIC ONLY 10CC  Final   Culture   Final           BLOOD CULTURE RECEIVED NO GROWTH TO DATE CULTURE WILL BE HELD FOR 5 DAYS BEFORE ISSUING A FINAL NEGATIVE REPORT Performed at [facility]    Report Status PENDING  Incomplete  Culture, blood (single)     Status: None (Preliminary result)   Collection Time: 01/13/15  6:28 PM  Result Value Ref Range Status   Specimen Description BLOOD LEFT ARM  Final   Special Requests BOTTLES DRAWN AEROBIC AND ANAEROBIC 10CC  Final   Culture   Final           BLOOD CULTURE RECEIVED NO GROWTH TO DATE CULTURE WILL BE HELD FOR 5 DAYS BEFORE ISSUING A FINAL NEGATIVE REPORT Note: Culture results may be compromised due to an excessive volume of blood received in culture bottles. Performed at [facility]    Report Status PENDING  Incomplete  Clostridium Difficile by PCR     Status: None   Collection Time: 01/15/15  9:29 AM  Result Value Ref Range Status   C difficile by pcr NEGATIVE NEGATIVE Final    Medical History: Past Medical History  Diagnosis Date  . CAD (coronary artery disease)     Dr Peter Martinique  . HTN (hypertension)   . HLD (hyperlipidemia)   . DM (diabetes mellitus)   . ED (erectile dysfunction)   . Carotid stenosis   . Obesity   . OSA (obstructive sleep apnea)     oral appliance, Dr Ron Parker  . Paroxysmal atrial fibrillation   . Complication of anesthesia     " DIFFICULTY WAKING "  . Myocardial infarction 2007   Assessment: 74 y.o male presented to ED due to fever. Temp to 103 F at home. Recent hospitalization for for sepsis and a urinary tract infection. Pt was given antibiotics (Bactrim and Septra) last week and finished his last dosage tonight.  SCr = 1.33, estimated CrCl = 50 ml/min. Vancomycin 1500 mg IV x1 given in ED @ 04:22  Goal:  Vancomycin Trough = 15-5mcg/ml  Plan:  Cefepime 2gm IV  q12h Vancomycin 750 mg IV q12h Monitor clinical status,  renal function and adjust dose as needed.   Nicole Cella, RPh Clinical Pharmacist Pager: [phone] 01/19/2015,4:57 AM

## 2015-01-19 NOTE — Assessment & Plan Note (Addendum)
A1c 6.6%, adequate control See AVS nutritional recommendations

## 2015-01-19 NOTE — Progress Notes (Signed)
PHARMACIST - PHYSICIAN ORDER COMMUNICATION  CONCERNING: P&T Medication Policy on Herbal Medications  DESCRIPTION:  This patient's order for:  Saw Palmetto  has been noted.  This product(s) is classified as an "herbal" or natural product. Due to a lack of definitive safety studies or FDA approval, nonstandard manufacturing practices, plus the potential risk of unknown drug-drug interactions while on inpatient medications, the Pharmacy and Therapeutics Committee does not permit the use of "herbal" or natural products of this type within [place].   ACTION TAKEN: The pharmacy department is unable to verify this order at this time and your patient has been informed of this safety policy. Please reevaluate patient's clinical condition at discharge and address if the herbal or natural product(s) should be resumed at that time.   

## 2015-01-19 NOTE — Telephone Encounter (Signed)
Advised patient of dr hoppers note

## 2015-01-19 NOTE — ED Notes (Signed)
MD at bedside. 

## 2015-01-19 NOTE — Evaluation (Signed)
Physical Therapy Evaluation Patient Details Name: Jacob Curry MRN: [id number] DOB: [birth year] Today's Date: 01/19/2015   History of Present Illness  Patient is a 74 yo male admitted 01/19/15 with fever.  Patient with HCAP.  Recent hospitalization with UTI, sepsis.   PMH:  HTN, HLD, DM, CAD, MI, OSA, PAF  Clinical Impression  Patient is at modified independent level with all mobility and gait.  No acute PT needs identified - PT will sign off.  Encouraged ambulation with nursing staff.    Follow Up Recommendations No PT follow up;Supervision - Intermittent    Equipment Recommendations  None recommended by PT    Recommendations for Other Services       Precautions / Restrictions Precautions Precautions: None Restrictions Weight Bearing Restrictions: No      Mobility  Bed Mobility Overal bed mobility: Modified Independent             General bed mobility comments: Use of bed rail  Transfers Overall transfer level: Modified independent Equipment used: None                Ambulation/Gait Ambulation/Gait assistance: Modified independent (Device/Increase time) Ambulation Distance (Feet): 40 Feet Assistive device: None Gait Pattern/deviations: Step-through pattern;Decreased stride length Gait velocity: Decreased Gait velocity interpretation: Below normal speed for age/gender General Gait Details: Patient with good gait pattern and balance.  Slightly decreased gait speed.  Decreased Rt ankle movement impacting gait.  Stairs            Wheelchair Mobility    Modified Rankin (Stroke Patients Only)       Balance                                             Pertinent Vitals/Pain Pain Assessment: No/denies pain    Home Living Family/patient expects to be discharged to:: Private residence Living Arrangements: Spouse/significant other Available Help at Discharge: Family;Available 24 hours/day Type of Home: House Home Access:  Stairs to enter   [employer] of Steps: 2 Home Layout: One level Home Equipment: Cane - single point      Prior Function Level of Independence: Independent         Comments: Drives     Hand Dominance        Extremity/Trunk Assessment   Upper Extremity Assessment: Overall WFL for tasks assessed           Lower Extremity Assessment: Overall WFL for tasks assessed (Prior Rt ankle injury/surgery)      Cervical / Trunk Assessment: Normal  Communication   Communication: No difficulties  Cognition Arousal/Alertness: Awake/alert Behavior During Therapy: WFL for tasks assessed/performed Overall Cognitive Status: Within Functional Limits for tasks assessed                      General Comments      Exercises        Assessment/Plan    PT Assessment Patent does not need any further PT services  PT Diagnosis Abnormality of gait   PT Problem List    PT Treatment Interventions     PT Goals (Current goals can be found in the Care Plan section) Acute Rehab PT Goals PT Goal Formulation: All assessment and education complete, DC therapy    Frequency     Barriers to discharge        Co-evaluation  End of Session Equipment Utilized During Treatment: Gait belt Activity Tolerance: Patient tolerated treatment well Patient left: in bed;with call bell/phone within reach;with bed alarm set;with family/visitor present Nurse Communication: Mobility status         Time: 6283-1517 PT Time Calculation (min) (ACUTE ONLY): 11 min   Charges:   PT Evaluation $Initial PT Evaluation Tier I: 1 Procedure     PT G CodesDespina Curry 2015-02-10, 5:31 PM Jacob Curry. Jacob Curry, Jacob Curry Jacob Curry [phone]

## 2015-01-19 NOTE — Discharge Instructions (Signed)

## 2015-01-19 NOTE — H&P (Signed)
[facility] History and Physical  HAZEM KENNER NAT:[id number] DOB: [birth year] DOA: 01/19/2015  Referring physician: ED physician PCP: Unice Cobble, MD  Specialists:   Chief Complaint: Fever  HPI: Jacob Curry is a 74 y.o. male with PMH of Hypertension, hyperlipidemia, diabetes mellitus, coronary artery disease, history of myocardial infarction, OSA, PAF on Eliquis, recently treated pyelonephritis and sepsis, who presents with fever.  Patient reports that he was hospitalized from 5/11 to 5/15 due to pyelonephritis and sepsis. He was treated successfully with antibiotics, and discharged on Bactrim. He completed last dose of Bactrim last night. He does not have symptoms for UTI anymore. He reports that he developed fever with temperature 103.7 at home this morning. He does not have a cough, shortness of breath, runny nose, sore throat.  Currently patient denies running nose, ear pain, headaches, cough, chest pain, SOB, abdominal pain, diarrhea, constipation, dysuria, urgency, frequency, hematuria, skin rashes, leg swelling. No unilateral weakness, numbness or tingling sensations. No vision change or hearing loss.  In ED, patient was found to have WBC 18.3, temperature normal, no tachycardia, AKI, lactate 1.60. Chest x-ray showed mild bibasilar airspace opacities. Patient is admitted to inpatient for further evaluation and treatment.  Where does patient live?   At home    Can patient participate in ADLs?   Some   Review of Systems:   General: no fevers, chills, no changes in body weight, has poor appetite, has fatigue HEENT: no blurry vision, hearing changes or sore throat Pulm: no dyspnea, coughing, wheezing CV: no chest pain, palpitations Abd: no nausea, vomiting, abdominal pain, diarrhea, constipation GU: no dysuria, burning on urination, increased urinary frequency, hematuria  Ext: no leg edema Neuro: no unilateral weakness, numbness, or tingling, no vision change or  hearing loss Skin: no rash MSK: No muscle spasm, no deformity, no limitation of range of movement in spin Heme: No easy bruising.  Travel history: No recent long distant travel.  Allergy:  Allergies  Allergen Reactions  . Acetaminophen     Drug induced hepatitis  . Oxycodone-Acetaminophen     Drug induced hepatitis    Past Medical History  Diagnosis Date  . CAD (coronary artery disease)     Dr Peter Martinique  . HTN (hypertension)   . HLD (hyperlipidemia)   . DM (diabetes mellitus)   . ED (erectile dysfunction)   . Carotid stenosis   . Obesity   . OSA (obstructive sleep apnea)     oral appliance, Dr Ron Parker  . Paroxysmal atrial fibrillation   . Complication of anesthesia     " DIFFICULTY WAKING "  . Myocardial infarction 2007    Past Surgical History  Procedure Laterality Date  . Appendectomy  1958  . Ankle fracture surgery  1993  . Hand surgery  1965  . Tonsillectomy    . Cardiac catheterization  2007 & 2011  . Colonoscopy  2002    negative; Dr Olevia Perches  . Cardiac catheterization N/A 01/13/2015    Procedure: Left Heart Cath and Coronary Angiography;  Surgeon: Peter M Martinique, MD;  Location: [facility];  Service: Cardiovascular;  Laterality: N/A;    Social History:  reports that he quit smoking about 24 years ago. He has never used smokeless tobacco. He reports that he does not drink alcohol. His drug history is not on file.  Family History:  Family History  Problem Relation Age of Onset  . Diabetes Mother   . Colon cancer Paternal Uncle   . Heart  attack Father      4 vessel CBAG @ 38  . Diabetes Brother   . Stroke Neg Hx      Prior to Admission medications   Medication Sig Start Date End Date Taking? Authorizing Provider  amLODipine (NORVASC) 2.5 MG tablet Take 1 tablet (2.5 mg total) by mouth daily. 07/11/14   Peter M Martinique, MD  apixaban (ELIQUIS) 5 MG TABS tablet Take 1 tablet (5 mg total) by mouth 2 (two) times daily. 09/09/14   Peter M Martinique, MD   carvedilol (COREG) 12.5 MG tablet TAKE 1 AND 1/2 TABLETS TWICE DAILY WITH MEALS 07/08/14   Peter M Martinique, MD  fish oil-omega-3 fatty acids 1000 MG capsule Take 2 g by mouth daily.      Historical Provider, MD  ibuprofen (ADVIL,MOTRIN) 600 MG tablet Take 1 tablet (600 mg total) by mouth every 6 (six) hours as needed for fever. 01/15/15   Nita Sells, MD  losartan (COZAAR) 100 MG tablet TAKE 1 TABLET BY MOUTH EVERY DAY 07/08/14   Peter M Martinique, MD  metFORMIN (GLUCOPHAGE) 500 MG tablet TAKE ONE TABLET BY MOUTH EVERY MORNING 01/09/15   Hendricks Limes, MD  nitroGLYCERIN (NITROSTAT) 0.4 MG SL tablet Place 1 tablet (0.4 mg total) under the tongue every 5 (five) minutes as needed. 12/30/14   Peter M Martinique, MD  Saw Palmetto, Serenoa repens, (SAW PALMETTO PO) Take 1 tablet by mouth daily.      Historical Provider, MD  silodosin (RAPAFLO) 8 MG CAPS capsule Take 8 mg by mouth daily with breakfast.    Historical Provider, MD  simvastatin (ZOCOR) 40 MG tablet Take 1 tablet (40 mg total) by mouth at bedtime. 01/16/15   Peter M Martinique, MD  sulfamethoxazole-trimethoprim (BACTRIM DS,SEPTRA DS) 800-160 MG per tablet Take 1 tablet by mouth every 12 (twelve) hours. 01/15/15   Nita Sells, MD    Physical Exam: Filed Vitals:   01/19/15 0151 01/19/15 0153 01/19/15 0345 01/19/15 0400  BP: 115/42 119/43 110/44 109/45  Pulse: 89  81 78  Temp: 98.3 F (36.8 C)     TempSrc: Oral     Resp: 22  24 26   Height: 5\' 7"  (1.702 m)     Weight: 83.462 kg (184 lb)     SpO2: 95%  94% 95%   General: Not in acute distress HEENT:       Eyes: PERRL, EOMI, no scleral icterus.       ENT: No discharge from the ears and nose, no pharynx injection, no tonsillar enlargement.        Neck: No JVD, no bruit, no mass felt. Heme: No neck lymph node enlargement. Cardiac: S1/S2, RRR, No murmurs, No gallops or rubs. Pulm: No rales, wheezing, rhonchi or rubs. Abd: Soft, nondistended, nontender, no rebound pain, no organomegaly,  BS present. Ext: No edema bilaterally. 2+DP/PT pulse bilaterally. Musculoskeletal: No joint deformities, No joint redness or warmth, no limitation of ROM in spin. Skin: No rashes.  Neuro: Alert, oriented X3, cranial nerves II-XII grossly intact, muscle strength 5/5 in all extremities, sensation to light touch intact.  Psych: Patient is not psychotic, no suicidal or hemocidal ideation.  Labs on Admission:  Basic Metabolic Panel:  Recent Labs Lab 01/13/15 0239 01/14/15 0452 01/19/15 0207  NA 139 138 133*  K 3.3* 3.4* 3.6  CL 108 107 102  CO2 24 24 20*  GLUCOSE 129* 187* 197*  BUN 11 6 16   CREATININE 1.05 0.97 1.33*  CALCIUM 7.9* 8.3* 8.7*  Liver Function Tests:  Recent Labs Lab 01/14/15 0452 01/19/15 0207  AST 36 47*  ALT 29 65*  ALKPHOS 88 165*  BILITOT 0.6 0.5  PROT 6.3* 6.5  ALBUMIN 2.8* 3.1*   No results for input(s): LIPASE, AMYLASE in the last 168 hours. No results for input(s): AMMONIA in the last 168 hours. CBC:  Recent Labs Lab 01/14/15 0452 01/15/15 0635 01/19/15 0207  WBC 8.8 10.3 18.3*  NEUTROABS 6.6  --  17.4*  HGB 12.0* 11.8* 12.4*  HCT 36.0* 34.8* 36.8*  MCV 87.2 84.9 84.8  PLT 135* 170 221   Cardiac Enzymes:  Recent Labs Lab 01/12/15 1035 01/12/15 1631  TROPONINI 0.26* 0.71*    BNP (last 3 results) No results for input(s): BNP in the last 8760 hours.  ProBNP (last 3 results) No results for input(s): PROBNP in the last 8760 hours.  CBG:  Recent Labs Lab 01/14/15 1158 01/14/15 1733 01/14/15 2110 01/15/15 0756 01/15/15 1232  GLUCAP 96 220* 119* 128* 181*    Radiological Exams on Admission: Dg Chest 2 View  01/19/2015   CLINICAL DATA:  Acute onset of fever.  Initial encounter.  EXAM: CHEST  2 VIEW  COMPARISON:  Chest radiograph performed 01/11/2015  FINDINGS: The lungs are well-aerated. Mild vascular congestion is noted. Mild bibasilar opacities could reflect mild pneumonia or interstitial edema. No pleural effusion or  pneumothorax is seen.  The heart is mildly enlarged. No acute osseous abnormalities are seen.  IMPRESSION: Mild vascular congestion and mild cardiomegaly. Mild bibasilar airspace opacities could reflect pneumonia or interstitial edema.   Electronically Signed   By: Garald Balding M.D.   On: 01/19/2015 02:54    EKG: Independently reviewed.  Abnormal findings:  LAE   Assessment/Plan Principal Problem:   HCAP (healthcare-associated pneumonia) Active Problems:   Hyperlipidemia   ERECTILE DYSFUNCTION   Obstructive sleep apnea   HYPERTENSION, BENIGN   CAD, NATIVE VESSEL   Atrial fibrillation   DM (diabetes mellitus)   AKI (acute kidney injury)  HCAP (healthcare-associated pneumonia): X-ray findings are suggestive for mild HCAP. Currently patient has fever, but no other respiratory symptoms. He is not obviously septic on admission. Hemodynamically stable. - Will admit to Telemetry Bed due to Aifb - IV Vancomycin and cefepime  - albuterol Neb prn for SOB - Urine legionella and S. pneumococcal antigen - Follow up blood culture x2, sputum culture and respiratory virus panel - get lactic acid level  - IVF: 100 cc/h NS  HLD: Last LDL was 34 on 12/21/14 -Continue home medications: Zocor  HTN: -Continue home medications: Amlodipine, Coreg -Hold losartan due to AKI  DM-II: Last A1c 6.6 on 01/12/15, well controlled. Patient is taking  metformin       at home -ssi  Atrial Fibrillation: CHA2DS2-VASc Score is 3 , needs oral anticoagulation. Patient is on Eliquis at home.  Heart rate is well controlled. -continue Eliquis -continue Coreg  AKI: likely due to prerenal secondary to dehydration and continuation of ARB, Bactrim and NSAIDs -IVF as above -Check FeNa or FeUrea -US-renal -Follow up renal function by BMP -hold ARB and ibuprofen  DVT ppx: On Eliquis  Code Status: Full code Family Communication:  Yes, patient's    wife   at bed side Disposition Plan: Admit to inpatient   Date of  Service 01/19/2015    Ivor Costa [facility] Pager [phone]  If 7PM-7AM, please contact night-coverage [email or web address] Password [email or web address] 01/19/2015, 5:12 AM

## 2015-01-19 NOTE — Progress Notes (Signed)
   [facility]                                                                              Patient Demographics  Jacob Curry, is a 74 y.o. male, DOB - 11/30/1940, TJQ:[id number]  Admit date - 01/19/2015   Admitting Physician Ivor Costa, MD  Outpatient Primary MD for the patient is Unice Cobble, MD  LOS - 0   Chief Complaint  Patient presents with  . Fever      HPI on 01/19/2015 by Dr. Ivor Costa Jacob Curry is a 74 y.o. male with PMH of Hypertension, hyperlipidemia, diabetes mellitus, coronary artery disease, history of myocardial infarction, OSA, PAF on Eliquis, recently treated pyelonephritis and sepsis, who presents with fever. Patient reports that he was hospitalized from 5/11 to 5/15 due to pyelonephritis and sepsis. He was treated successfully with antibiotics, and discharged on Bactrim. He completed last dose of Bactrim last night. He does not have symptoms for UTI anymore. He reports that he developed fever with temperature 103.7 at home this morning. He does not have a cough, shortness of breath, runny nose, sore throat. Currently patient denies running nose, ear pain, headaches, cough, chest pain, SOB, abdominal pain, diarrhea, constipation, dysuria, urgency, frequency, hematuria, skin rashes, leg swelling. No unilateral weakness, numbness or tingling sensations. No vision change or hearing loss. In ED, patient was found to have WBC 18.3, temperature normal, no tachycardia, AKI, lactate 1.60. Chest x-ray showed mild bibasilar airspace opacities. Patient is admitted to inpatient for further evaluation and treatment.  Assessment & Plan   Patient admitted earlier this morning. Please see full H&P. Agree with current assessment and plan.  Sepsis secondary to HCAP -Patient had fever of 103F at home, upon arrival to the emergency department he was tachypneic with leukocytosis -Vital signs are currently stable -CXR: Mild vascular congestion and cardiomegaly, mild  bibasilar airspace opacities could reflect pneumonia versus interstitial edema -Patient started on antibiotics, vancomycin and cefepime -Strep pneumonia urine antigen negative -Respiratory viral panel, Legionella urine antigen, blood cultures pending  Atrial fibrillation -CHADSVasc 3 -Currently rate controlled, continue Eliquis and Coreg  Acute kidney injury -Possibly secondary to dehydration versus ARB versus NSAIDs versus Bactrim -Will continue to monitor BMP -Baseline creatinine was 1, currently 1.33  Diabetes mellitus, type II -hemoglobin A1c on 01/12/2015 6.6 -Metformin held -Continue insulin sliding scale CBG monitoring  Hypertension -Losartan held due to AK I -Continue amlodipine and Coreg   Hyperlipidemia  -Continue statin  Code Status: Full  Family Communication: None at bedside  Disposition Plan: Admitted  Time Spent in minutes   30 minutes  Procedures  None  Consults   None  DVT Prophylaxis  Eliquis  Artrice Kraker D.O. on 01/19/2015 at 12:25 PM  Between 7am to 7pm - Pager - [phone]  After 7pm go to [email or web address] - password [email or web address]  And look for the night coverage person covering for me after hours  [facility] Office  [phone]

## 2015-01-19 NOTE — ED Provider Notes (Signed)
CSN: [id number]     Arrival date & time 01/19/15  0140 History  This chart was scribed for Everlene Balls, MD by Julien Nordmann, ED Scribe. This patient was seen in room A01C/A01C and the patient's care was started at 3:22 AM.    Chief Complaint  Patient presents with  . Fever      The history is provided by the patient and the spouse. No language interpreter was used.    HPI Comments: Jacob Curry is a 74 y.o. male who presents to the Emergency Department complaining of a constant fever (triage temp 98.3 F) onset about 2 hours ago. Per wife, his fever was 103.7 F about 2 hours ago and was given three 200 mg of ibuprofen about 6 hours ago. Pt states he was in the hopsital week for heart difficulty and was given a cathiterization. He denies stent placements, however, wife reports total blockages. Pt was given antibiotics (Bactrim and Septra) last week and finished his last dosage tonight. He denies coughing and urinary problems.  Past Medical History  Diagnosis Date  . CAD (coronary artery disease)     Dr Peter Martinique  . HTN (hypertension)   . HLD (hyperlipidemia)   . DM (diabetes mellitus)   . ED (erectile dysfunction)   . Carotid stenosis   . Obesity   . OSA (obstructive sleep apnea)     oral appliance, Dr Ron Parker  . Paroxysmal atrial fibrillation   . Complication of anesthesia     " DIFFICULTY WAKING "  . Myocardial infarction 2007   Past Surgical History  Procedure Laterality Date  . Appendectomy  1958  . Ankle fracture surgery  1993  . Hand surgery  1965  . Tonsillectomy    . Cardiac catheterization  2007 & 2011  . Colonoscopy  2002    negative; Dr Olevia Perches  . Cardiac catheterization N/A 01/13/2015    Procedure: Left Heart Cath and Coronary Angiography;  Surgeon: Peter M Martinique, MD;  Location: [facility];  Service: Cardiovascular;  Laterality: N/A;   Family History  Problem Relation Age of Onset  . Diabetes Mother   . Colon cancer Paternal Uncle   . Heart attack  Father      4 vessel CBAG @ 93  . Diabetes Brother   . Stroke Neg Hx    History  Substance Use Topics  . Smoking status: Former Smoker -- 1.00 packs/day    Quit date: 09/02/1990  . Smokeless tobacco: Never Used     Comment: onset age 61-50 up to 1 ppd  . Alcohol Use: No    Review of Systems  A complete 10 system review of systems was obtained and all systems are negative except as noted in the HPI and PMH.    Allergies  Acetaminophen and Oxycodone-acetaminophen  Home Medications   Prior to Admission medications   Medication Sig Start Date End Date Taking? Authorizing Provider  amLODipine (NORVASC) 2.5 MG tablet Take 1 tablet (2.5 mg total) by mouth daily. 07/11/14   Peter M Martinique, MD  apixaban (ELIQUIS) 5 MG TABS tablet Take 1 tablet (5 mg total) by mouth 2 (two) times daily. 09/09/14   Peter M Martinique, MD  carvedilol (COREG) 12.5 MG tablet TAKE 1 AND 1/2 TABLETS TWICE DAILY WITH MEALS 07/08/14   Peter M Martinique, MD  fish oil-omega-3 fatty acids 1000 MG capsule Take 2 g by mouth daily.      Historical Provider, MD  ibuprofen (ADVIL,MOTRIN) 600 MG  tablet Take 1 tablet (600 mg total) by mouth every 6 (six) hours as needed for fever. 01/15/15   Nita Sells, MD  losartan (COZAAR) 100 MG tablet TAKE 1 TABLET BY MOUTH EVERY DAY 07/08/14   Peter M Martinique, MD  metFORMIN (GLUCOPHAGE) 500 MG tablet TAKE ONE TABLET BY MOUTH EVERY MORNING 01/09/15   Hendricks Limes, MD  nitroGLYCERIN (NITROSTAT) 0.4 MG SL tablet Place 1 tablet (0.4 mg total) under the tongue every 5 (five) minutes as needed. 12/30/14   Peter M Martinique, MD  Saw Palmetto, Serenoa repens, (SAW PALMETTO PO) Take 1 tablet by mouth daily.      Historical Provider, MD  silodosin (RAPAFLO) 8 MG CAPS capsule Take 8 mg by mouth daily with breakfast.    Historical Provider, MD  simvastatin (ZOCOR) 40 MG tablet Take 1 tablet (40 mg total) by mouth at bedtime. 01/16/15   Peter M Martinique, MD  sulfamethoxazole-trimethoprim (BACTRIM DS,SEPTRA  DS) 800-160 MG per tablet Take 1 tablet by mouth every 12 (twelve) hours. 01/15/15   Nita Sells, MD   Triage vitals: BP 119/43 mmHg  Pulse 89  Temp(Src) 98.3 F (36.8 C) (Oral)  Resp 22  Ht 5\' 7"  (1.702 m)  Wt 184 lb (83.462 kg)  BMI 28.81 kg/m2  SpO2 95% Physical Exam  Constitutional: He is oriented to person, place, and time. Vital signs are normal. He appears well-developed and well-nourished.  Non-toxic appearance. He does not appear ill. No distress.  HENT:  Head: Normocephalic and atraumatic.  Nose: Nose normal.  Mouth/Throat: Oropharynx is clear and moist. No oropharyngeal exudate.  Eyes: Conjunctivae and EOM are normal. Pupils are equal, round, and reactive to light. No scleral icterus.  Neck: Normal range of motion. Neck supple. No tracheal deviation, no edema, no erythema and normal range of motion present. No thyroid mass and no thyromegaly present.  Cardiovascular: Normal rate, regular rhythm, S1 normal, S2 normal, normal heart sounds, intact distal pulses and normal pulses.  Exam reveals no gallop and no friction rub.   No murmur heard. Pulses:      Radial pulses are 2+ on the right side, and 2+ on the left side.       Dorsalis pedis pulses are 2+ on the right side, and 2+ on the left side.  Pulmonary/Chest: Effort normal and breath sounds normal. Tachypnea noted. No respiratory distress. He has no wheezes. He has no rhonchi. He has no rales.  Abdominal: Soft. Normal appearance and bowel sounds are normal. He exhibits no distension, no ascites and no mass. There is no hepatosplenomegaly. There is no tenderness. There is no rebound, no guarding and no CVA tenderness.  Musculoskeletal: Normal range of motion. He exhibits no edema or tenderness.  Lymphadenopathy:    He has no cervical adenopathy.  Neurological: He is alert and oriented to person, place, and time. He has normal strength. No cranial nerve deficit or sensory deficit.  Skin: Skin is warm and intact. No  petechiae and no rash noted. He is diaphoretic. No erythema. No pallor.  Psychiatric: He has a normal mood and affect. His behavior is normal. Judgment normal.  Nursing note and vitals reviewed.   ED Course  Procedures   DIAGNOSTIC STUDIES: Oxygen Saturation is 95% on RA, adequate by my interpretation.  COORDINATION OF CARE:  3:26 AM Discussed treatment plan which includes blood work with pt at bedside and pt agreed to plan.   Labs Review Labs Reviewed  CBC WITH DIFFERENTIAL/PLATELET - Abnormal; Notable for  the following:    WBC 18.3 (*)    Hemoglobin 12.4 (*)    HCT 36.8 (*)    Neutrophils Relative % 95 (*)    Lymphocytes Relative 3 (*)    Monocytes Relative 2 (*)    Neutro Abs 17.4 (*)    Lymphs Abs 0.5 (*)    All other components within normal limits  COMPREHENSIVE METABOLIC PANEL - Abnormal; Notable for the following:    Sodium 133 (*)    CO2 20 (*)    Glucose, Bld 197 (*)    Creatinine, Ser 1.33 (*)    Calcium 8.7 (*)    Albumin 3.1 (*)    AST 47 (*)    ALT 65 (*)    Alkaline Phosphatase 165 (*)    GFR calc non Af Amer 51 (*)    GFR calc Af Amer 59 (*)    All other components within normal limits  CULTURE, BLOOD (ROUTINE X 2)  CULTURE, BLOOD (ROUTINE X 2)  URINALYSIS, ROUTINE W REFLEX MICROSCOPIC  I-STAT CG4 LACTIC ACID, ED    Imaging Review Dg Chest 2 View  01/19/2015   CLINICAL DATA:  Acute onset of fever.  Initial encounter.  EXAM: CHEST  2 VIEW  COMPARISON:  Chest radiograph performed 01/11/2015  FINDINGS: The lungs are well-aerated. Mild vascular congestion is noted. Mild bibasilar opacities could reflect mild pneumonia or interstitial edema. No pleural effusion or pneumothorax is seen.  The heart is mildly enlarged. No acute osseous abnormalities are seen.  IMPRESSION: Mild vascular congestion and mild cardiomegaly. Mild bibasilar airspace opacities could reflect pneumonia or interstitial edema.   Electronically Signed   By: Garald Balding M.D.   On:  01/19/2015 02:54     EKG Interpretation   Date/Time:  Thursday Jan 19 2015 03:33:11 EDT Ventricular Rate:  78 PR Interval:  147 QRS Duration: 100 QT Interval:  387 QTC Calculation: 441 R Axis:   74 Text Interpretation:  Sinus rhythm Probable left atrial enlargement No  significant change since last tracing Confirmed by Glynn Octave  [id number]) on 01/19/2015 3:58:13 AM      MDM   Final diagnoses:  Fever   Patient since emergency department for fevers as high as 103 at home. He is recently admitted to the hospital for sepsis and a urinary tract infection. Patient took Motrin prior to arrival. He is afebrile here. Chest x-ray reveals bibasilar pneumonia. He'll be treated as age. Blood cultures drawn, he was given vancomycin and Zosyn for treatment.  I spoke with Dr.Nu with [facility] hospitalist who will admit the patient for continued management.   I personally performed the services described in this documentation, which was scribed in my presence. The recorded information has been reviewed and is accurate.   Everlene Balls, MD 01/19/15 [phone]

## 2015-01-20 ENCOUNTER — Encounter (HOSPITAL_COMMUNITY): Payer: Self-pay

## 2015-01-20 DIAGNOSIS — I2510 Atherosclerotic heart disease of native coronary artery without angina pectoris: Secondary | ICD-10-CM

## 2015-01-20 DIAGNOSIS — N179 Acute kidney failure, unspecified: Secondary | ICD-10-CM

## 2015-01-20 DIAGNOSIS — E118 Type 2 diabetes mellitus with unspecified complications: Secondary | ICD-10-CM

## 2015-01-20 LAB — CBC
HCT: 30.6 % — ABNORMAL LOW (ref 39.0–52.0)
Hemoglobin: 10.3 g/dL — ABNORMAL LOW (ref 13.0–17.0)
MCH: 28.9 pg (ref 26.0–34.0)
MCHC: 33.7 g/dL (ref 30.0–36.0)
MCV: 85.7 fL (ref 78.0–100.0)
Platelets: 208 10*3/uL (ref 150–400)
RBC: 3.57 MIL/uL — ABNORMAL LOW (ref 4.22–5.81)
RDW: 13.9 % (ref 11.5–15.5)
WBC: 20.9 10*3/uL — ABNORMAL HIGH (ref 4.0–10.5)

## 2015-01-20 LAB — BASIC METABOLIC PANEL
Anion gap: 7 (ref 5–15)
BUN: 10 mg/dL (ref 6–20)
CO2: 22 mmol/L (ref 22–32)
Calcium: 8.1 mg/dL — ABNORMAL LOW (ref 8.9–10.3)
Chloride: 105 mmol/L (ref 101–111)
Creatinine, Ser: 0.91 mg/dL (ref 0.61–1.24)
GFR calc Af Amer: >60 mL/min (ref >60)
GFR calc non Af Amer: >60 mL/min (ref >60)
Glucose, Bld: 113 mg/dL — ABNORMAL HIGH (ref 65–99)
Potassium: 3.3 mmol/L — ABNORMAL LOW (ref 3.5–5.1)
Sodium: 134 mmol/L — ABNORMAL LOW (ref 135–145)

## 2015-01-20 LAB — GLUCOSE, CAPILLARY
Glucose-Capillary: 104 mg/dL — ABNORMAL HIGH (ref 65–99)
Glucose-Capillary: 117 mg/dL — ABNORMAL HIGH (ref 65–99)
Glucose-Capillary: 121 mg/dL — ABNORMAL HIGH (ref 65–99)
Glucose-Capillary: 153 mg/dL — ABNORMAL HIGH (ref 65–99)
Glucose-Capillary: 139 mg/dL — ABNORMAL HIGH (ref 65–99)
Glucose-Capillary: 119 mg/dL — ABNORMAL HIGH (ref 65–99)

## 2015-01-20 LAB — CULTURE, BLOOD (SINGLE): Culture: NO GROWTH

## 2015-01-20 LAB — LEGIONELLA ANTIGEN, URINE

## 2015-01-20 LAB — CLOSTRIDIUM DIFFICILE BY PCR: Toxigenic C. Difficile by PCR: NEGATIVE

## 2015-01-20 MED ORDER — ATORVASTATIN CALCIUM 20 MG PO TABS
20.0000 mg | ORAL_TABLET | Freq: Every day | ORAL | Status: DC
  Administered 2015-01-20 – 2015-01-21 (×2): 20 mg via ORAL
  Filled 2015-01-20 (×3): qty 1

## 2015-01-20 MED ORDER — VANCOMYCIN HCL IN DEXTROSE 1-5 GM/200ML-% IV SOLN
1000.0000 mg | Freq: Two times a day (BID) | INTRAVENOUS | Status: DC
  Administered 2015-01-20 – 2015-01-22 (×4): 1000 mg via INTRAVENOUS
  Filled 2015-01-20 (×5): qty 200

## 2015-01-20 MED ORDER — SACCHAROMYCES BOULARDII 250 MG PO CAPS
250.0000 mg | ORAL_CAPSULE | Freq: Two times a day (BID) | ORAL | Status: DC
  Administered 2015-01-20 – 2015-01-22 (×5): 250 mg via ORAL
  Filled 2015-01-20 (×6): qty 1

## 2015-01-20 MED ORDER — LOPERAMIDE HCL 2 MG PO CAPS
2.0000 mg | ORAL_CAPSULE | ORAL | Status: DC | PRN
  Administered 2015-01-20 (×2): 2 mg via ORAL
  Filled 2015-01-20 (×2): qty 1

## 2015-01-20 MED ORDER — POTASSIUM CHLORIDE CRYS ER 20 MEQ PO TBCR
40.0000 meq | EXTENDED_RELEASE_TABLET | Freq: Once | ORAL | Status: AC
  Administered 2015-01-20: 40 meq via ORAL
  Filled 2015-01-20: qty 2

## 2015-01-20 NOTE — Care Management Note (Signed)
Case Management Note  Patient Details  Name: Jacob Curry MRN: [id number] Date of Birth: 07-Oct-1940  Subjective/Objective:      Admitted with [facility]              Action/Plan: CM following for DCP, awaiting on PT/OT eval for disposition needs  Expected Discharge Date:      01/23/2015            Expected Discharge Plan:   home vs snf  Status of Service:   In progress   Additional Comments:  Sherrilyn Rist [phone] 01/20/2015, 11:51 AM

## 2015-01-20 NOTE — Progress Notes (Signed)
OT Cancellation Note  Patient Details Name: Jacob Curry MRN: [id number] DOB: 03/07/1941   Cancelled Treatment:    Reason Eval/Treat Not Completed: OT screened, no needs identified, will sign off. Pt is performing self care and ADL transfers at a modified to independent level. Signing off.  Malka So 01/20/2015, 12:34 PM  [phone]

## 2015-01-20 NOTE — Progress Notes (Signed)
Pt refused to turn bed alarm on, explained the risk of falling but pt still refused.

## 2015-01-20 NOTE — Progress Notes (Signed)
[facility]                                                                              Patient Demographics  Jacob Curry, is a 74 y.o. male, DOB - 28-Jan-1941, IRW:[id number]  Admit date - 01/19/2015   Admitting Physician Ivor Costa, MD  Outpatient Primary MD for the patient is Unice Cobble, MD  LOS - 1   Chief Complaint  Patient presents with  . Fever      HPI on 01/19/2015 by Dr. Ivor Costa Jacob Curry is a 74 y.o. male with PMH of Hypertension, hyperlipidemia, diabetes mellitus, coronary artery disease, history of myocardial infarction, OSA, PAF on Eliquis, recently treated pyelonephritis and sepsis, who presents with fever. Patient reports that he was hospitalized from 5/11 to 5/15 due to pyelonephritis and sepsis. He was treated successfully with antibiotics, and discharged on Bactrim. He completed last dose of Bactrim last night. He does not have symptoms for UTI anymore. He reports that he developed fever with temperature 103.7 at home this morning. He does not have a cough, shortness of breath, runny nose, sore throat. Currently patient denies running nose, ear pain, headaches, cough, chest pain, SOB, abdominal pain, diarrhea, constipation, dysuria, urgency, frequency, hematuria, skin rashes, leg swelling. No unilateral weakness, numbness or tingling sensations. No vision change or hearing loss. In ED, patient was found to have WBC 18.3, temperature normal, no tachycardia, AKI, lactate 1.60. Chest x-ray showed mild bibasilar airspace opacities. Patient is admitted to inpatient for further evaluation and treatment.  Assessment & Plan  Sepsis secondary to HCAP -Patient had fever of 103F at home, upon arrival to the emergency department he was tachypneic with leukocytosis -Vital signs are currently stable -CXR: Mild vascular congestion and cardiomegaly, mild bibasilar airspace opacities could reflect pneumonia versus interstitial edema -Patient started on  antibiotics, vancomycin and cefepime -Strep pneumonia and legionella urine antigen negative -Respiratory viral panel pending -blood cultures show no growth today  Atrial fibrillation -CHADSVasc 3 -Currently rate controlled, continue Eliquis and Coreg  Acute kidney injury -Possibly secondary to dehydration versus ARB versus NSAIDs versus Bactrim -Will continue to monitor BMP -Baseline creatinine was 1, currently 0.91  Diabetes mellitus, type II -hemoglobin A1c on 01/12/2015 6.6 -Metformin held -Continue insulin sliding scale CBG monitoring  Hypertension -Stable -Losartan held due to AKI -Continue amlodipine and Coreg   Hyperlipidemia  -Continue statin   Diarrhea -Cdiff PCR negative -Possibly secondary to antibiotics -Will order florastor and Imodium  Code Status: Full  Family Communication: Wife at bedside  Disposition Plan: Admitted  Time Spent in minutes   30 minutes  Procedures  None  Consults   None  DVT Prophylaxis  Eliquis  Lab Results  Component Value Date   PLT 208 01/20/2015    Medications  Scheduled Meds: . amLODipine  2.5 mg Oral Daily  . apixaban  5 mg Oral BID  . carvedilol  18.75 mg Oral BID WC  . ceFEPime (MAXIPIME) IV  2 g Intravenous Q12H  . insulin aspart  0-9 Units Subcutaneous TID WC  . omega-3 acid ethyl esters  2 g Oral Daily  . saccharomyces boulardii  250 mg Oral BID  . simvastatin  40 mg Oral QHS  . tamsulosin  0.4 mg Oral Daily  . vancomycin  1,000 mg Intravenous Q12H   Continuous Infusions: . sodium chloride 100 mL/hr at 01/19/15 1652   PRN Meds:.albuterol, loperamide, nitroGLYCERIN  Antibiotics    Anti-infectives    Start     Dose/Rate Route Frequency Ordered Stop   01/20/15 1630  vancomycin (VANCOCIN) IVPB 1000 mg/200 mL premix     1,000 mg 200 mL/hr over 60 Minutes Intravenous Every 12 hours 01/20/15 1027     01/19/15 1630  vancomycin (VANCOCIN) IVPB 750 mg/150 ml premix  Status:  Discontinued     750 mg 150  mL/hr over 60 Minutes Intravenous Every 12 hours 01/19/15 0509 01/20/15 1027   01/19/15 0600  ceFEPIme (MAXIPIME) 1 g in dextrose 5 % 50 mL IVPB  Status:  Discontinued     1 g 100 mL/hr over 30 Minutes Intravenous 3 times per day 01/19/15 0501 01/19/15 0507   01/19/15 0600  ceFEPIme (MAXIPIME) 2 g in dextrose 5 % 50 mL IVPB     2 g 100 mL/hr over 30 Minutes Intravenous Every 12 hours 01/19/15 0457     01/19/15 0400  piperacillin-tazobactam (ZOSYN) IVPB 3.375 g  Status:  Discontinued     3.375 g 100 mL/hr over 30 Minutes Intravenous  Once 01/19/15 0354 01/19/15 0414   01/19/15 0345  vancomycin (VANCOCIN) 1,500 mg in sodium chloride 0.9 % 500 mL IVPB     1,500 mg 250 mL/hr over 120 Minutes Intravenous  Once 01/19/15 0338 01/19/15 0622   01/19/15 0345  piperacillin-tazobactam (ZOSYN) IVPB 4.5 g  Status:  Discontinued     4.5 g 200 mL/hr over 30 Minutes Intravenous  Once 01/19/15 0338 01/19/15 0353        Subjective:   Jacob Curry seen and examined today.  Patient states he's feeling better than yesterday. He continues to complain of feeling somewhat warm. Denies any nausea, vomiting or constipation. Continues to have several loose bowel movements. Patient denies any cough or shortness of breath or chest pain at this time.  Objective:   Filed Vitals:   01/19/15 2255 01/20/15 0657 01/20/15 0811 01/20/15 0821  BP: 135/51  131/57 127/55  Pulse: 79  73 76  Temp: 99.7 F (37.6 C)  97.9 F (36.6 C)   TempSrc: Oral  Oral   Resp:   18   Height:      Weight:  87.6 kg (193 lb 2 oz)    SpO2: 96%       Wt Readings from Last 3 Encounters:  01/20/15 87.6 kg (193 lb 2 oz)  01/18/15 85.049 kg (187 lb 8 oz)  01/14/15 91.7 kg (202 lb 2.6 oz)     Intake/Output Summary (Last 24 hours) at 01/20/15 1256 Last data filed at 01/20/15 0900  Gross per 24 hour  Intake 2311.67 ml  Output   1125 ml  Net 1186.67 ml    Exam  General: Well developed, well nourished, NAD, appears stated  age  HEENT: NCAT, mucous membranes moist.   Cardiovascular: S1 S2 auscultated, no rubs, murmurs or gallops. Regular rate and rhythm.  Respiratory: Diminished but clear to auscultation, no wheezes or rhonchi noted  Abdomen: Soft, nontender, nondistended, + bowel sounds  Extremities: warm dry without cyanosis clubbing or edema  Neuro: AAOx3, nonfocal  Psych: Normal affect and demeanor with intact judgement and insight  Data Review   Micro Results Recent Results (from the past 240 hour(s))  Culture, blood (routine  x 2)     Status: None   Collection Time: 01/12/15 12:19 AM  Result Value Ref Range Status   Specimen Description BLOOD RIGHT ARM  Final   Special Requests   Final    BOTTLES DRAWN AEROBIC AND ANAEROBIC BLUE 10CC RED 5CC   Culture   Final    NO GROWTH 5 DAYS Note: Culture results may be compromised due to an excessive volume of blood received in culture bottles. Performed at [facility]    Report Status 01/18/2015 FINAL  Final  Culture, blood (routine x 2)     Status: None   Collection Time: 01/12/15 12:26 AM  Result Value Ref Range Status   Specimen Description BLOOD RIGHT HAND  Final   Special Requests BOTTLES DRAWN AEROBIC ONLY 10CC  Final   Culture   Final    NO GROWTH 5 DAYS Performed at [facility]    Report Status 01/18/2015 FINAL  Final  Urine culture     Status: None   Collection Time: 01/12/15 12:52 AM  Result Value Ref Range Status   Specimen Description URINE, CATHETERIZED  Final   Special Requests NONE  Final   Colony Count   Final    >=100,000 COLONIES/ML Performed at [facility]    Culture   Final    KLEBSIELLA PNEUMONIAE Performed at [facility]    Report Status 01/14/2015 FINAL  Final   Organism ID, Bacteria KLEBSIELLA PNEUMONIAE  Final      Susceptibility   Klebsiella pneumoniae - MIC*    AMPICILLIN >=32 RESISTANT Resistant     CEFAZOLIN <=4 SENSITIVE Sensitive     CEFTRIAXONE <=1 SENSITIVE  Sensitive     CIPROFLOXACIN <=0.25 SENSITIVE Sensitive     GENTAMICIN <=1 SENSITIVE Sensitive     LEVOFLOXACIN <=0.12 SENSITIVE Sensitive     NITROFURANTOIN <=16 SENSITIVE Sensitive     TOBRAMYCIN <=1 SENSITIVE Sensitive     TRIMETH/SULFA <=20 SENSITIVE Sensitive     PIP/TAZO <=4 SENSITIVE Sensitive     * KLEBSIELLA PNEUMONIAE  MRSA PCR Screening     Status: None   Collection Time: 01/12/15  6:42 AM  Result Value Ref Range Status   MRSA by PCR NEGATIVE NEGATIVE Final    Comment:        The GeneXpert MRSA Assay (FDA approved for NASAL specimens only), is one component of a comprehensive MRSA colonization surveillance program. It is not intended to diagnose MRSA infection nor to guide or monitor treatment for MRSA infections.   Culture, blood (routine x 2)     Status: None   Collection Time: 01/12/15  4:30 PM  Result Value Ref Range Status   Specimen Description BLOOD RIGHT ANTECUBITAL  Final   Special Requests BOTTLES DRAWN AEROBIC AND ANAEROBIC 10CC  Final   Culture   Final    KLEBSIELLA PNEUMONIAE Note: Culture results may be compromised due to an excessive volume of blood received in culture bottles. Gram Stain Report Called to,Read Back By and Verified With: Su Hilt 01/18/15 0845 BY COOKV Performed at [facility]    Report Status 01/19/2015 FINAL  Final   Organism ID, Bacteria KLEBSIELLA PNEUMONIAE  Final      Susceptibility   Klebsiella pneumoniae - MIC*    AMPICILLIN RESISTANT      AMPICILLIN/SULBACTAM 4 SENSITIVE Sensitive     CEFAZOLIN <=4 SENSITIVE Sensitive     CEFEPIME <=1 SENSITIVE Sensitive     CEFTAZIDIME <=1 SENSITIVE Sensitive  CEFTRIAXONE <=1 SENSITIVE Sensitive     CIPROFLOXACIN <=0.25 SENSITIVE Sensitive     GENTAMICIN <=1 SENSITIVE Sensitive     IMIPENEM <=0.25 SENSITIVE Sensitive     PIP/TAZO <=4 SENSITIVE Sensitive     TOBRAMYCIN <=1 SENSITIVE Sensitive     TRIMETH/SULFA <=20 SENSITIVE Sensitive     * KLEBSIELLA PNEUMONIAE   Culture, blood (routine x 2)     Status: None   Collection Time: 01/12/15  4:38 PM  Result Value Ref Range Status   Specimen Description BLOOD RIGHT HAND  Final   Special Requests BOTTLES DRAWN AEROBIC ONLY 10CC  Final   Culture   Final    NO GROWTH 5 DAYS Note: Culture results may be compromised due to an excessive volume of blood received in culture bottles. Performed at [facility]    Report Status 01/19/2015 FINAL  Final  Culture, blood (single)     Status: None   Collection Time: 01/13/15  6:28 PM  Result Value Ref Range Status   Specimen Description BLOOD LEFT ARM  Final   Special Requests BOTTLES DRAWN AEROBIC AND ANAEROBIC 10CC  Final   Culture   Final    NO GROWTH 5 DAYS Note: Culture results may be compromised due to an excessive volume of blood received in culture bottles. Performed at [facility]    Report Status 01/20/2015 FINAL  Final  Clostridium Difficile by PCR     Status: None   Collection Time: 01/15/15  9:29 AM  Result Value Ref Range Status   C difficile by pcr NEGATIVE NEGATIVE Final  Culture, blood (routine x 2)     Status: None (Preliminary result)   Collection Time: 01/19/15  4:00 AM  Result Value Ref Range Status   Specimen Description BLOOD LEFT ANTECUBITAL  Final   Special Requests BOTTLES DRAWN AEROBIC AND ANAEROBIC 10CC  Final   Culture   Final           BLOOD CULTURE RECEIVED NO GROWTH TO DATE CULTURE WILL BE HELD FOR 5 DAYS BEFORE ISSUING A FINAL NEGATIVE REPORT Performed at [facility]    Report Status PENDING  Incomplete  Culture, blood (routine x 2)     Status: None (Preliminary result)   Collection Time: 01/19/15  4:10 AM  Result Value Ref Range Status   Specimen Description BLOOD RIGHT ANTECUBITAL  Final   Special Requests BOTTLES DRAWN AEROBIC AND ANAEROBIC 5CC  Final   Culture   Final           BLOOD CULTURE RECEIVED NO GROWTH TO DATE CULTURE WILL BE HELD FOR 5 DAYS BEFORE ISSUING A FINAL NEGATIVE  REPORT Performed at [facility]    Report Status PENDING  Incomplete  Clostridium Difficile by PCR     Status: None   Collection Time: 01/19/15  8:00 PM  Result Value Ref Range Status   C difficile by pcr NEGATIVE NEGATIVE Final    Radiology Reports Dg Chest 2 View  01/19/2015   CLINICAL DATA:  Acute onset of fever.  Initial encounter.  EXAM: CHEST  2 VIEW  COMPARISON:  Chest radiograph performed 01/11/2015  FINDINGS: The lungs are well-aerated. Mild vascular congestion is noted. Mild bibasilar opacities could reflect mild pneumonia or interstitial edema. No pleural effusion or pneumothorax is seen.  The heart is mildly enlarged. No acute osseous abnormalities are seen.  IMPRESSION: Mild vascular congestion and mild cardiomegaly. Mild bibasilar airspace opacities could reflect pneumonia or interstitial edema.  Electronically Signed   By: Garald Balding M.D.   On: 01/19/2015 02:54   Dg Chest 2 View  01/12/2015   CLINICAL DATA:  Fever. Known Coronary artery disease, managed medically. Hypertension.  EXAM: CHEST  2 VIEW  COMPARISON:  07/13/2013  FINDINGS: The heart size and mediastinal contours are within normal limits. Both lungs are clear. The visualized skeletal structures are unremarkable.  IMPRESSION: No active cardiopulmonary disease.   Electronically Signed   By: Andreas Newport M.D.   On: 01/12/2015 00:23    CBC  Recent Labs Lab 01/14/15 0452 01/15/15 0635 01/19/15 0207 01/20/15 0409  WBC 8.8 10.3 18.3* 20.9*  HGB 12.0* 11.8* 12.4* 10.3*  HCT 36.0* 34.8* 36.8* 30.6*  PLT 135* 170 221 208  MCV 87.2 84.9 84.8 85.7  MCH 29.1 28.8 28.6 28.9  MCHC 33.3 33.9 33.7 33.7  RDW 13.6 13.4 13.4 13.9  LYMPHSABS 0.9  --  0.5*  --   MONOABS 1.1*  --  0.4  --   EOSABS 0.1  --  0.0  --   BASOSABS 0.0  --  0.0  --     Chemistries   Recent Labs Lab 01/14/15 0452 01/19/15 0207 01/20/15 0409  NA 138 133* 134*  K 3.4* 3.6 3.3*  CL 107 102 105  CO2 24 20* 22  GLUCOSE 187*  197* 113*  BUN 6 16 10   CREATININE 0.97 1.33* 0.91  CALCIUM 8.3* 8.7* 8.1*  AST 36 47*  --   ALT 29 65*  --   ALKPHOS 88 165*  --   BILITOT 0.6 0.5  --    ------------------------------------------------------------------------------------------------------------------ estimated creatinine clearance is 75.2 mL/min (by C-G formula based on Cr of 0.91). ------------------------------------------------------------------------------------------------------------------ No results for input(s): HGBA1C in the last 72 hours. ------------------------------------------------------------------------------------------------------------------ No results for input(s): CHOL, HDL, LDLCALC, TRIG, CHOLHDL, LDLDIRECT in the last 72 hours. ------------------------------------------------------------------------------------------------------------------ No results for input(s): TSH, T4TOTAL, T3FREE, THYROIDAB in the last 72 hours.  Invalid input(s): FREET3 ------------------------------------------------------------------------------------------------------------------ No results for input(s): VITAMINB12, FOLATE, FERRITIN, TIBC, IRON, RETICCTPCT in the last 72 hours.  Coagulation profile No results for input(s): INR, PROTIME in the last 168 hours.  No results for input(s): DDIMER in the last 72 hours.  Cardiac Enzymes No results for input(s): CKMB, TROPONINI, MYOGLOBIN in the last 168 hours.  Invalid input(s): CK ------------------------------------------------------------------------------------------------------------------ Invalid input(s): POCBNP    Dori Devino D.O. on 01/20/2015 at 12:56 PM  Between 7am to 7pm - Pager - [phone]  After 7pm go to [email or web address] - password [email or web address]  And look for the night coverage person covering for me after hours  [facility] Office  [phone]

## 2015-01-21 ENCOUNTER — Inpatient Hospital Stay (HOSPITAL_COMMUNITY): Payer: Medicare Other

## 2015-01-21 DIAGNOSIS — G4733 Obstructive sleep apnea (adult) (pediatric): Secondary | ICD-10-CM

## 2015-01-21 LAB — RESPIRATORY VIRUS PANEL
Adenovirus: NEGATIVE
Influenza A: NEGATIVE
Influenza B: NEGATIVE
Metapneumovirus: NEGATIVE
Parainfluenza 1: NEGATIVE
Parainfluenza 2: NEGATIVE
Parainfluenza 3: NEGATIVE
Respiratory Syncytial Virus A: NEGATIVE
Respiratory Syncytial Virus B: NEGATIVE
Rhinovirus: NEGATIVE

## 2015-01-21 LAB — BASIC METABOLIC PANEL
Anion gap: 10 (ref 5–15)
BUN: 8 mg/dL (ref 6–20)
CO2: 23 mmol/L (ref 22–32)
Calcium: 8.4 mg/dL — ABNORMAL LOW (ref 8.9–10.3)
Chloride: 105 mmol/L (ref 101–111)
Creatinine, Ser: 0.8 mg/dL (ref 0.61–1.24)
GFR calc Af Amer: >60 mL/min (ref >60)
GFR calc non Af Amer: >60 mL/min (ref >60)
Glucose, Bld: 101 mg/dL — ABNORMAL HIGH (ref 65–99)
Potassium: 3.7 mmol/L (ref 3.5–5.1)
Sodium: 138 mmol/L (ref 135–145)

## 2015-01-21 LAB — GLUCOSE, CAPILLARY
Glucose-Capillary: 126 mg/dL — ABNORMAL HIGH (ref 65–99)
Glucose-Capillary: 118 mg/dL — ABNORMAL HIGH (ref 65–99)
Glucose-Capillary: 145 mg/dL — ABNORMAL HIGH (ref 65–99)
Glucose-Capillary: 166 mg/dL — ABNORMAL HIGH (ref 65–99)

## 2015-01-21 LAB — CBC
HCT: 33.1 % — ABNORMAL LOW (ref 39.0–52.0)
Hemoglobin: 11.1 g/dL — ABNORMAL LOW (ref 13.0–17.0)
MCH: 28.7 pg (ref 26.0–34.0)
MCHC: 33.5 g/dL (ref 30.0–36.0)
MCV: 85.5 fL (ref 78.0–100.0)
Platelets: 227 10*3/uL (ref 150–400)
RBC: 3.87 MIL/uL — ABNORMAL LOW (ref 4.22–5.81)
RDW: 14 % (ref 11.5–15.5)
WBC: 16.8 10*3/uL — ABNORMAL HIGH (ref 4.0–10.5)

## 2015-01-21 NOTE — Progress Notes (Signed)
[facility]                                                                              Patient Demographics  Jacob Curry, is a 74 y.o. male, DOB - 1941-04-12, WUX:[id number]  Admit date - 01/19/2015   Admitting Physician Ivor Costa, MD  Outpatient Primary MD for the patient is Unice Cobble, MD  LOS - 2   Chief Complaint  Patient presents with  . Fever      HPI on 01/19/2015 by Dr. Ivor Costa Jacob Curry is a 74 y.o. male with PMH of Hypertension, hyperlipidemia, diabetes mellitus, coronary artery disease, history of myocardial infarction, OSA, PAF on Eliquis, recently treated pyelonephritis and sepsis, who presents with fever. Patient reports that he was hospitalized from 5/11 to 5/15 due to pyelonephritis and sepsis. He was treated successfully with antibiotics, and discharged on Bactrim. He completed last dose of Bactrim last night. He does not have symptoms for UTI anymore. He reports that he developed fever with temperature 103.7 at home this morning. He does not have a cough, shortness of breath, runny nose, sore throat. Currently patient denies running nose, ear pain, headaches, cough, chest pain, SOB, abdominal pain, diarrhea, constipation, dysuria, urgency, frequency, hematuria, skin rashes, leg swelling. No unilateral weakness, numbness or tingling sensations. No vision change or hearing loss. In ED, patient was found to have WBC 18.3, temperature normal, no tachycardia, AKI, lactate 1.60. Chest x-ray showed mild bibasilar airspace opacities. Patient is admitted to inpatient for further evaluation and treatment.  Assessment & Plan  Sepsis secondary to HCAP -Patient had fever of 103F at home, upon arrival to the emergency department he was tachypneic with leukocytosis -Leukocytosis improving, patient currently afebrile -Vital signs are currently stable -CXR: Mild vascular congestion and cardiomegaly, mild bibasilar airspace opacities could reflect pneumonia  versus interstitial edema -Patient started on antibiotics, vancomycin and cefepime -Strep pneumonia and legionella urine antigen negative -Respiratory viral panel pending -blood cultures show no growth to date -Will obtain repeat CXR  Atrial fibrillation -CHADSVasc 3 -Currently rate controlled, continue Eliquis and Coreg  Acute kidney injury -Possibly secondary to dehydration versus ARB versus NSAIDs versus Bactrim -Will continue to monitor BMP -Baseline creatinine was 1, currently 0.80  Diabetes mellitus, type II -hemoglobin A1c on 01/12/2015 6.6 -Metformin held -Continue insulin sliding scale CBG monitoring  Hypertension -Stable -Losartan held due to AKI -Continue amlodipine and Coreg   Hyperlipidemia  -Continue statin   Diarrhea -Cdiff PCR negative -Possibly secondary to antibiotics -Will order florastor and Imodium  Code Status: Full  Family Communication: Wife at bedside  Disposition Plan: Admitted.  If continued improvement, will likely discharge 01/22/2015  Time Spent in minutes   30 minutes  Procedures  None  Consults   None  DVT Prophylaxis  Eliquis  Lab Results  Component Value Date   PLT 227 01/21/2015    Medications  Scheduled Meds: . amLODipine  2.5 mg Oral Daily  . apixaban  5 mg Oral BID  . atorvastatin  20 mg Oral q1800  . carvedilol  18.75 mg Oral BID WC  . ceFEPime (MAXIPIME) IV  2 g Intravenous Q12H  . insulin aspart  0-9 Units  Subcutaneous TID WC  . omega-3 acid ethyl esters  2 g Oral Daily  . saccharomyces boulardii  250 mg Oral BID  . tamsulosin  0.4 mg Oral Daily  . vancomycin  1,000 mg Intravenous Q12H   Continuous Infusions: . sodium chloride 100 mL/hr at 01/20/15 1730   PRN Meds:.albuterol, loperamide, nitroGLYCERIN  Antibiotics    Anti-infectives    Start     Dose/Rate Route Frequency Ordered Stop   01/20/15 1630  vancomycin (VANCOCIN) IVPB 1000 mg/200 mL premix     1,000 mg 200 mL/hr over 60 Minutes Intravenous  Every 12 hours 01/20/15 1027     01/19/15 1630  vancomycin (VANCOCIN) IVPB 750 mg/150 ml premix  Status:  Discontinued     750 mg 150 mL/hr over 60 Minutes Intravenous Every 12 hours 01/19/15 0509 01/20/15 1027   01/19/15 0600  ceFEPIme (MAXIPIME) 1 g in dextrose 5 % 50 mL IVPB  Status:  Discontinued     1 g 100 mL/hr over 30 Minutes Intravenous 3 times per day 01/19/15 0501 01/19/15 0507   01/19/15 0600  ceFEPIme (MAXIPIME) 2 g in dextrose 5 % 50 mL IVPB     2 g 100 mL/hr over 30 Minutes Intravenous Every 12 hours 01/19/15 0457     01/19/15 0400  piperacillin-tazobactam (ZOSYN) IVPB 3.375 g  Status:  Discontinued     3.375 g 100 mL/hr over 30 Minutes Intravenous  Once 01/19/15 0354 01/19/15 0414   01/19/15 0345  vancomycin (VANCOCIN) 1,500 mg in sodium chloride 0.9 % 500 mL IVPB     1,500 mg 250 mL/hr over 120 Minutes Intravenous  Once 01/19/15 0338 01/19/15 0622   01/19/15 0345  piperacillin-tazobactam (ZOSYN) IVPB 4.5 g  Status:  Discontinued     4.5 g 200 mL/hr over 30 Minutes Intravenous  Once 01/19/15 0338 01/19/15 0353        Subjective:   Jacob Curry seen and examined today.  Patient states he would like to take a shower.  Denies fever or sweating, cough, shortness of breath, chest pain.  States his bowel movements are not as loose.    Objective:   Filed Vitals:   01/20/15 1728 01/20/15 2153 01/21/15 0751 01/21/15 0755  BP: 153/65 164/82  129/61  Pulse: 71 74  72  Temp: 98.2 F (36.8 C) 98 F (36.7 C)    TempSrc: Oral Oral    Resp: 18 18    Height:      Weight:   85.004 kg (187 lb 6.4 oz)   SpO2: 96% 96%      Wt Readings from Last 3 Encounters:  01/21/15 85.004 kg (187 lb 6.4 oz)  01/18/15 85.049 kg (187 lb 8 oz)  01/14/15 91.7 kg (202 lb 2.6 oz)     Intake/Output Summary (Last 24 hours) at 01/21/15 1014 Last data filed at 01/21/15 4920  Gross per 24 hour  Intake   2610 ml  Output   1300 ml  Net   1310 ml    Exam  General: Well developed, well  nourished, no distress  HEENT: NCAT, mucous membranes moist.   Cardiovascular: S1 S2 auscultated, no murmurs, RRR  Respiratory: diminished breath sounds at the bases, otherwise clear  Abdomen: Soft, nontender, nondistended, + bowel sounds  Extremities: warm dry without cyanosis clubbing or edema  Neuro: AAOx3, nonfocal  Psych: Appropriate mood and affect  Data Review   Micro Results Recent Results (from the past 240 hour(s))  Culture, blood (routine x 2)  Status: None   Collection Time: 01/12/15 12:19 AM  Result Value Ref Range Status   Specimen Description BLOOD RIGHT ARM  Final   Special Requests   Final    BOTTLES DRAWN AEROBIC AND ANAEROBIC BLUE 10CC RED 5CC   Culture   Final    NO GROWTH 5 DAYS Note: Culture results may be compromised due to an excessive volume of blood received in culture bottles. Performed at [facility]    Report Status 01/18/2015 FINAL  Final  Culture, blood (routine x 2)     Status: None   Collection Time: 01/12/15 12:26 AM  Result Value Ref Range Status   Specimen Description BLOOD RIGHT HAND  Final   Special Requests BOTTLES DRAWN AEROBIC ONLY 10CC  Final   Culture   Final    NO GROWTH 5 DAYS Performed at [facility]    Report Status 01/18/2015 FINAL  Final  Urine culture     Status: None   Collection Time: 01/12/15 12:52 AM  Result Value Ref Range Status   Specimen Description URINE, CATHETERIZED  Final   Special Requests NONE  Final   Colony Count   Final    >=100,000 COLONIES/ML Performed at [facility]    Culture   Final    KLEBSIELLA PNEUMONIAE Performed at [facility]    Report Status 01/14/2015 FINAL  Final   Organism ID, Bacteria KLEBSIELLA PNEUMONIAE  Final      Susceptibility   Klebsiella pneumoniae - MIC*    AMPICILLIN >=32 RESISTANT Resistant     CEFAZOLIN <=4 SENSITIVE Sensitive     CEFTRIAXONE <=1 SENSITIVE Sensitive     CIPROFLOXACIN <=0.25 SENSITIVE Sensitive      GENTAMICIN <=1 SENSITIVE Sensitive     LEVOFLOXACIN <=0.12 SENSITIVE Sensitive     NITROFURANTOIN <=16 SENSITIVE Sensitive     TOBRAMYCIN <=1 SENSITIVE Sensitive     TRIMETH/SULFA <=20 SENSITIVE Sensitive     PIP/TAZO <=4 SENSITIVE Sensitive     * KLEBSIELLA PNEUMONIAE  MRSA PCR Screening     Status: None   Collection Time: 01/12/15  6:42 AM  Result Value Ref Range Status   MRSA by PCR NEGATIVE NEGATIVE Final    Comment:        The GeneXpert MRSA Assay (FDA approved for NASAL specimens only), is one component of a comprehensive MRSA colonization surveillance program. It is not intended to diagnose MRSA infection nor to guide or monitor treatment for MRSA infections.   Culture, blood (routine x 2)     Status: None   Collection Time: 01/12/15  4:30 PM  Result Value Ref Range Status   Specimen Description BLOOD RIGHT ANTECUBITAL  Final   Special Requests BOTTLES DRAWN AEROBIC AND ANAEROBIC 10CC  Final   Culture   Final    KLEBSIELLA PNEUMONIAE Note: Culture results may be compromised due to an excessive volume of blood received in culture bottles. Gram Stain Report Called to,Read Back By and Verified With: Su Hilt 01/18/15 0845 BY COOKV Performed at [facility]    Report Status 01/19/2015 FINAL  Final   Organism ID, Bacteria KLEBSIELLA PNEUMONIAE  Final      Susceptibility   Klebsiella pneumoniae - MIC*    AMPICILLIN RESISTANT      AMPICILLIN/SULBACTAM 4 SENSITIVE Sensitive     CEFAZOLIN <=4 SENSITIVE Sensitive     CEFEPIME <=1 SENSITIVE Sensitive     CEFTAZIDIME <=1 SENSITIVE Sensitive     CEFTRIAXONE <=1 SENSITIVE Sensitive  CIPROFLOXACIN <=0.25 SENSITIVE Sensitive     GENTAMICIN <=1 SENSITIVE Sensitive     IMIPENEM <=0.25 SENSITIVE Sensitive     PIP/TAZO <=4 SENSITIVE Sensitive     TOBRAMYCIN <=1 SENSITIVE Sensitive     TRIMETH/SULFA <=20 SENSITIVE Sensitive     * KLEBSIELLA PNEUMONIAE  Culture, blood (routine x 2)     Status: None   Collection  Time: 01/12/15  4:38 PM  Result Value Ref Range Status   Specimen Description BLOOD RIGHT HAND  Final   Special Requests BOTTLES DRAWN AEROBIC ONLY 10CC  Final   Culture   Final    NO GROWTH 5 DAYS Note: Culture results may be compromised due to an excessive volume of blood received in culture bottles. Performed at [facility]    Report Status 01/19/2015 FINAL  Final  Culture, blood (single)     Status: None   Collection Time: 01/13/15  6:28 PM  Result Value Ref Range Status   Specimen Description BLOOD LEFT ARM  Final   Special Requests BOTTLES DRAWN AEROBIC AND ANAEROBIC 10CC  Final   Culture   Final    NO GROWTH 5 DAYS Note: Culture results may be compromised due to an excessive volume of blood received in culture bottles. Performed at [facility]    Report Status 01/20/2015 FINAL  Final  Clostridium Difficile by PCR     Status: None   Collection Time: 01/15/15  9:29 AM  Result Value Ref Range Status   C difficile by pcr NEGATIVE NEGATIVE Final  Culture, blood (routine x 2)     Status: None (Preliminary result)   Collection Time: 01/19/15  4:00 AM  Result Value Ref Range Status   Specimen Description BLOOD LEFT ANTECUBITAL  Final   Special Requests BOTTLES DRAWN AEROBIC AND ANAEROBIC 10CC  Final   Culture   Final           BLOOD CULTURE RECEIVED NO GROWTH TO DATE CULTURE WILL BE HELD FOR 5 DAYS BEFORE ISSUING A FINAL NEGATIVE REPORT Performed at [facility]    Report Status PENDING  Incomplete  Culture, blood (routine x 2)     Status: None (Preliminary result)   Collection Time: 01/19/15  4:10 AM  Result Value Ref Range Status   Specimen Description BLOOD RIGHT ANTECUBITAL  Final   Special Requests BOTTLES DRAWN AEROBIC AND ANAEROBIC 5CC  Final   Culture   Final           BLOOD CULTURE RECEIVED NO GROWTH TO DATE CULTURE WILL BE HELD FOR 5 DAYS BEFORE ISSUING A FINAL NEGATIVE REPORT Performed at [facility]    Report Status PENDING   Incomplete  Respiratory virus panel     Status: None   Collection Time: 01/19/15  5:02 AM  Result Value Ref Range Status   Source - RVPAN NASAL SWAB  Corrected   Respiratory Syncytial Virus A Negative Negative Final   Respiratory Syncytial Virus B Negative Negative Final   Influenza A Negative Negative Final   Influenza B Negative Negative Final   Parainfluenza 1 Negative Negative Final   Parainfluenza 2 Negative Negative Final   Parainfluenza 3 Negative Negative Final   Metapneumovirus Negative Negative Final   Rhinovirus Negative Negative Final   Adenovirus Negative Negative Final    Comment: (NOTE) Performed At: [facility] [address] [place], [place] [id number] Lindon Romp MD [id number]   Clostridium Difficile by PCR     Status: None  Collection Time: 01/19/15  8:00 PM  Result Value Ref Range Status   C difficile by pcr NEGATIVE NEGATIVE Final    Radiology Reports Dg Chest 2 View  01/19/2015   CLINICAL DATA:  Acute onset of fever.  Initial encounter.  EXAM: CHEST  2 VIEW  COMPARISON:  Chest radiograph performed 01/11/2015  FINDINGS: The lungs are well-aerated. Mild vascular congestion is noted. Mild bibasilar opacities could reflect mild pneumonia or interstitial edema. No pleural effusion or pneumothorax is seen.  The heart is mildly enlarged. No acute osseous abnormalities are seen.  IMPRESSION: Mild vascular congestion and mild cardiomegaly. Mild bibasilar airspace opacities could reflect pneumonia or interstitial edema.   Electronically Signed   By: Garald Balding M.D.   On: 01/19/2015 02:54   Dg Chest 2 View  01/12/2015   CLINICAL DATA:  Fever. Known Coronary artery disease, managed medically. Hypertension.  EXAM: CHEST  2 VIEW  COMPARISON:  07/13/2013  FINDINGS: The heart size and mediastinal contours are within normal limits. Both lungs are clear. The visualized skeletal structures are unremarkable.  IMPRESSION: No active cardiopulmonary disease.    Electronically Signed   By: Andreas Newport M.D.   On: 01/12/2015 00:23    CBC  Recent Labs Lab 01/15/15 0635 01/19/15 0207 01/20/15 0409 01/21/15 0310  WBC 10.3 18.3* 20.9* 16.8*  HGB 11.8* 12.4* 10.3* 11.1*  HCT 34.8* 36.8* 30.6* 33.1*  PLT 170 221 208 227  MCV 84.9 84.8 85.7 85.5  MCH 28.8 28.6 28.9 28.7  MCHC 33.9 33.7 33.7 33.5  RDW 13.4 13.4 13.9 14.0  LYMPHSABS  --  0.5*  --   --   MONOABS  --  0.4  --   --   EOSABS  --  0.0  --   --   BASOSABS  --  0.0  --   --     Chemistries   Recent Labs Lab 01/19/15 0207 01/20/15 0409 01/21/15 0310  NA 133* 134* 138  K 3.6 3.3* 3.7  CL 102 105 105  CO2 20* 22 23  GLUCOSE 197* 113* 101*  BUN 16 10 8   CREATININE 1.33* 0.91 0.80  CALCIUM 8.7* 8.1* 8.4*  AST 47*  --   --   ALT 65*  --   --   ALKPHOS 165*  --   --   BILITOT 0.5  --   --    ------------------------------------------------------------------------------------------------------------------ estimated creatinine clearance is 84.4 mL/min (by C-G formula based on Cr of 0.8). ------------------------------------------------------------------------------------------------------------------ No results for input(s): HGBA1C in the last 72 hours. ------------------------------------------------------------------------------------------------------------------ No results for input(s): CHOL, HDL, LDLCALC, TRIG, CHOLHDL, LDLDIRECT in the last 72 hours. ------------------------------------------------------------------------------------------------------------------ No results for input(s): TSH, T4TOTAL, T3FREE, THYROIDAB in the last 72 hours.  Invalid input(s): FREET3 ------------------------------------------------------------------------------------------------------------------ No results for input(s): VITAMINB12, FOLATE, FERRITIN, TIBC, IRON, RETICCTPCT in the last 72 hours.  Coagulation profile No results for input(s): INR, PROTIME in the last 168 hours.  No  results for input(s): DDIMER in the last 72 hours.  Cardiac Enzymes No results for input(s): CKMB, TROPONINI, MYOGLOBIN in the last 168 hours.  Invalid input(s): CK ------------------------------------------------------------------------------------------------------------------ Invalid input(s): POCBNP    Marielouise Amey D.O. on 01/21/2015 at 10:14 AM  Between 7am to 7pm - Pager - [phone]  After 7pm go to [email or web address] - password [email or web address]  And look for the night coverage person covering for me after hours  [facility] Office  [phone]

## 2015-01-22 ENCOUNTER — Encounter (HOSPITAL_COMMUNITY): Payer: Self-pay | Admitting: Radiology

## 2015-01-22 ENCOUNTER — Inpatient Hospital Stay (HOSPITAL_COMMUNITY): Payer: Medicare Other

## 2015-01-22 DIAGNOSIS — A419 Sepsis, unspecified organism: Principal | ICD-10-CM

## 2015-01-22 LAB — CBC
HCT: 36.6 % — ABNORMAL LOW (ref 39.0–52.0)
Hemoglobin: 12.2 g/dL — ABNORMAL LOW (ref 13.0–17.0)
MCH: 28.7 pg (ref 26.0–34.0)
MCHC: 33.3 g/dL (ref 30.0–36.0)
MCV: 86.1 fL (ref 78.0–100.0)
Platelets: 291 10*3/uL (ref 150–400)
RBC: 4.25 MIL/uL (ref 4.22–5.81)
RDW: 13.7 % (ref 11.5–15.5)
WBC: 13.7 10*3/uL — ABNORMAL HIGH (ref 4.0–10.5)

## 2015-01-22 LAB — GLUCOSE, CAPILLARY
Glucose-Capillary: 151 mg/dL — ABNORMAL HIGH (ref 65–99)
Glucose-Capillary: 146 mg/dL — ABNORMAL HIGH (ref 65–99)

## 2015-01-22 MED ORDER — CEFUROXIME AXETIL 500 MG PO TABS: 500.0000 mg | ORAL_TABLET | Freq: Two times a day (BID) | ORAL | Status: DC

## 2015-01-22 MED ORDER — IOHEXOL 300 MG/ML  SOLN
75.0000 mL | Freq: Once | INTRAMUSCULAR | Status: AC | PRN
  Administered 2015-01-22: 75 mL via INTRAVENOUS

## 2015-01-22 MED ORDER — SACCHAROMYCES BOULARDII 250 MG PO CAPS: 250.0000 mg | ORAL_CAPSULE | Freq: Two times a day (BID) | ORAL | Status: DC

## 2015-01-22 MED ORDER — LOSARTAN POTASSIUM 50 MG PO TABS
100.0000 mg | ORAL_TABLET | Freq: Every day | ORAL | Status: DC
Start: 2015-01-22 — End: 2015-01-22
  Administered 2015-01-22: 100 mg via ORAL
  Filled 2015-01-22: qty 2

## 2015-01-22 MED ORDER — LOPERAMIDE HCL 2 MG PO CAPS
2.0000 mg | ORAL_CAPSULE | ORAL | Status: DC | PRN
Start: 2015-01-22 — End: 2015-01-31

## 2015-01-22 NOTE — Discharge Summary (Signed)
Physician Discharge Summary  JACOB CICERO TDD:[id number] DOB: [birth year] DOA: 01/19/2015  PCP: Unice Cobble, MD  Admit date: 01/19/2015 Discharge date: 01/22/2015  Time spent: 45 minutes  Recommendations for Outpatient Follow-up:  Patient will be discharged to home.  Patient will need to follow up with primary care provider within one week of discharge.  Patient should continue medications as prescribed.  Patient should follow a heart healthy diet.    Discharge Diagnoses:  Sepsis secondary to HCAP vs recent Klebsiella bacteremia Atrial fibrillation Acute kidney injury Diabetes mellitus, type II Hypertension Hyperlipidemia Diarrhea  Discharge Condition: Stable  Diet recommendation: heart healthy   Filed Weights   01/20/15 0657 01/21/15 0751 01/22/15 0642  Weight: 87.6 kg (193 lb 2 oz) 85.004 kg (187 lb 6.4 oz) 83.825 kg (184 lb 12.8 oz)    History of present illness:  on 01/19/2015 by Dr. Ivor Costa Jacob Curry is a 74 y.o. male with PMH of Hypertension, hyperlipidemia, diabetes mellitus, coronary artery disease, history of myocardial infarction, OSA, PAF on Eliquis, recently treated pyelonephritis and sepsis, who presents with fever. Patient reports that he was hospitalized from 5/11 to 5/15 due to pyelonephritis and sepsis. He was treated successfully with antibiotics, and discharged on Bactrim. He completed last dose of Bactrim last night. He does not have symptoms for UTI anymore. He reports that he developed fever with temperature 103.7 at home this morning. He does not have a cough, shortness of breath, runny nose, sore throat. Currently patient denies running nose, ear pain, headaches, cough, chest pain, SOB, abdominal pain, diarrhea, constipation, dysuria, urgency, frequency, hematuria, skin rashes, leg swelling. No unilateral weakness, numbness or tingling sensations. No vision change or hearing loss. In ED, patient was found to have WBC 18.3, temperature normal, no  tachycardia, AKI, lactate 1.60. Chest x-ray showed mild bibasilar airspace opacities. Patient is admitted to inpatient for further evaluation and treatment.  Hospital Course:  Sepsis secondary to HCAP vs recent Klebsiella bacteremia -Patient had fever of 103F at home, upon arrival to the emergency department he was tachypneic with leukocytosis -Leukocytosis improving, patient currently afebrile -Blood cultures from 01/12/2015 during patient's last hospitalization showed Klebsiella bacteremia -Vital signs are currently stable -CXR: Mild vascular congestion and cardiomegaly, mild bibasilar airspace opacities could reflect pneumonia versus interstitial edema -Patient started on antibiotics, vancomycin and cefepime, will discharge patient with Ceftin 500 mg twice a day -Strep pneumonia and legionella urine antigen negative -Respiratory viral panel negative -blood cultures show no growth to date -Question whether patient truly had pneumonia versus continuation of sepsis from bacteremia -Patient will need to continue antibiotics- total 2 weeks from last hospital admission -Repeat chest x-ray on 521 showed questionable lung nodule -Follow-up CT of the chest with contrast showed no lung nodule, small to minimal pleural effusion  Atrial fibrillation -CHADSVasc 3 -Currently rate controlled, continue Eliquis and Coreg  Acute kidney injury -Possibly secondary to dehydration versus ARB versus NSAIDs versus Bactrim -Baseline creatinine was 1, currently 0.80  Diabetes mellitus, type II -hemoglobin A1c on 01/12/2015 6.6 -Metformin held- may continue at discharge  Hypertension -Stable -Losartan held due to AKI, however may continue at discharge -Continue amlodipine and Coreg    Hyperlipidemia  -Continue statin   Diarrhea -Cdiff PCR negative -Diarrhea has improved -Possibly secondary to antibiotics -Continue florastor and Imodium  Procedures  None  Consults  None  Discharge  Exam: Filed Vitals:   01/22/15 0637  BP: 152/73  Pulse: 71  Temp: 98.1 F (36.7 C)  Resp: 18  Exam  General: Well developed, well nourished, no distress  HEENT: NCAT, mucous membranes moist.   Cardiovascular: S1 S2 auscultated, no murmurs, RRR  Respiratory: Clear to auscultation  Abdomen: Soft, nontender, nondistended, + bowel sounds  Extremities: warm dry without cyanosis clubbing or edema  Neuro: AAOx3, nonfocal  Psych: Appropriate mood and affect, pleasant  Discharge Instructions      Discharge Instructions    Discharge instructions    Complete by:  As directed   Patient will be discharged to home.  Patient will need to follow up with primary care provider within one week of discharge.  Patient should continue medications as prescribed.  Patient should follow a heart healthy diet.     Increase activity slowly    Complete by:  As directed             Medication List    STOP taking these medications        ibuprofen 600 MG tablet  Commonly known as:  ADVIL,MOTRIN     sulfamethoxazole-trimethoprim 800-160 MG per tablet  Commonly known as:  BACTRIM DS,SEPTRA DS      TAKE these medications        amLODipine 2.5 MG tablet  Commonly known as:  NORVASC  Take 1 tablet (2.5 mg total) by mouth daily.     apixaban 5 MG Tabs tablet  Commonly known as:  ELIQUIS  Take 1 tablet (5 mg total) by mouth 2 (two) times daily.     carvedilol 12.5 MG tablet  Commonly known as:  COREG  TAKE 1 AND 1/2 TABLETS TWICE DAILY WITH MEALS     cefUROXime 500 MG tablet  Commonly known as:  CEFTIN  Take 1 tablet (500 mg total) by mouth 2 (two) times daily with a meal.     fish oil-omega-3 fatty acids 1000 MG capsule  Take 2 g by mouth daily.     loperamide 2 MG capsule  Commonly known as:  IMODIUM  Take 1 capsule (2 mg total) by mouth as needed for diarrhea or loose stools.     losartan 100 MG tablet  Commonly known as:  COZAAR  TAKE 1 TABLET BY MOUTH EVERY DAY      metFORMIN 500 MG tablet  Commonly known as:  GLUCOPHAGE  TAKE ONE TABLET BY MOUTH EVERY MORNING     nitroGLYCERIN 0.4 MG SL tablet  Commonly known as:  NITROSTAT  Place 1 tablet (0.4 mg total) under the tongue every 5 (five) minutes as needed.     saccharomyces boulardii 250 MG capsule  Commonly known as:  FLORASTOR  Take 1 capsule (250 mg total) by mouth 2 (two) times daily.     SAW PALMETTO PO  Take 1 tablet by mouth daily.     silodosin 8 MG Caps capsule  Commonly known as:  RAPAFLO  Take 8 mg by mouth daily with breakfast.     simvastatin 40 MG tablet  Commonly known as:  ZOCOR  Take 1 tablet (40 mg total) by mouth at bedtime.       Allergies  Allergen Reactions  . Acetaminophen     Drug induced hepatitis  . Oxycodone-Acetaminophen     Drug induced hepatitis   Follow-up Information    Follow up with Unice Cobble, MD. Schedule an appointment as soon as possible for a visit in 1 week.   Specialty:  Internal Medicine   Why:  Hospital follow up   Contact information:   [address]  [place] [address] [phone]        The results of significant diagnostics from this hospitalization (including imaging, microbiology, ancillary and laboratory) are listed below for reference.    Significant Diagnostic Studies: Dg Chest 2 View  01/21/2015   CLINICAL DATA:  Pneumonia.  Diabetic.  EXAM: CHEST  2 VIEW  COMPARISON:  Chest radiograph 01/19/2015  FINDINGS: Stable enlarged cardiac and mediastinal contours. Elevation of the right hemidiaphragm. No consolidative pulmonary opacities. No pleural effusion or pneumothorax. Cannot exclude possible left upper lobe nodule. Mid thoracic spine degenerative changes.  IMPRESSION: No acute cardiopulmonary process.  Possible left upper lobe nodule. In the nonacute setting, recommend further evaluation with chest CT.  These results will be called to the ordering clinician or representative by the Radiologist Assistant, and communication  documented in the PACS or zVision Dashboard.   Electronically Signed   By: Lovey Newcomer M.D.   On: 01/21/2015 14:43   Dg Chest 2 View  01/19/2015   CLINICAL DATA:  Acute onset of fever.  Initial encounter.  EXAM: CHEST  2 VIEW  COMPARISON:  Chest radiograph performed 01/11/2015  FINDINGS: The lungs are well-aerated. Mild vascular congestion is noted. Mild bibasilar opacities could reflect mild pneumonia or interstitial edema. No pleural effusion or pneumothorax is seen.  The heart is mildly enlarged. No acute osseous abnormalities are seen.  IMPRESSION: Mild vascular congestion and mild cardiomegaly. Mild bibasilar airspace opacities could reflect pneumonia or interstitial edema.   Electronically Signed   By: Garald Balding M.D.   On: 01/19/2015 02:54   Dg Chest 2 View  01/12/2015   CLINICAL DATA:  Fever. Known Coronary artery disease, managed medically. Hypertension.  EXAM: CHEST  2 VIEW  COMPARISON:  07/13/2013  FINDINGS: The heart size and mediastinal contours are within normal limits. Both lungs are clear. The visualized skeletal structures are unremarkable.  IMPRESSION: No active cardiopulmonary disease.   Electronically Signed   By: Andreas Newport M.D.   On: 01/12/2015 00:23   Ct Chest W Contrast  01/22/2015   CLINICAL DATA:  Possible left upper lobe nodule on chest radiographs obtained yesterday.  EXAM: CT CHEST WITH CONTRAST  TECHNIQUE: Multidetector CT imaging of the chest was performed during intravenous contrast administration.  CONTRAST:  35mL OMNIPAQUE IOHEXOL 300 MG/ML  SOLN  COMPARISON:  Previous chest radiographs, the most recent yesterday.  FINDINGS: Small right pleural effusion and minimal left pleural effusion. Minimal bilateral dependent atelectasis. Mild, scattered bullous changes on the right. No lung nodules or enlarged lymph nodes. Dense coronary artery calcifications. Left renal cyst.  There is an area of scalloping in the inferior aspect of the left clavicular head with adjacent  sclerosis. These changes correspond to the questioned nodular density seen on the recent chest radiographs. Thoracic spine degenerative changes.  IMPRESSION: 1. No lung nodule. 2. The recently suspected left upper lobe lung nodule corresponds to a rhomboid fossa with surrounding sclerosis in the inferior left clavicular head. 3. Small right pleural effusion and minimal left pleural effusion. 4. Mild changes of COPD.   Electronically Signed   By: Claudie Revering M.D.   On: 01/22/2015 12:35    Microbiology: Recent Results (from the past 240 hour(s))  Culture, blood (routine x 2)     Status: None   Collection Time: 01/12/15  4:30 PM  Result Value Ref Range Status   Specimen Description BLOOD RIGHT ANTECUBITAL  Final   Special Requests BOTTLES DRAWN AEROBIC AND ANAEROBIC 10CC  Final   Culture  Final    KLEBSIELLA PNEUMONIAE Note: Culture results may be compromised due to an excessive volume of blood received in culture bottles. Gram Stain Report Called to,Read Back By and Verified With: Su Hilt 01/18/15 0845 BY COOKV Performed at [facility]    Report Status 01/19/2015 FINAL  Final   Organism ID, Bacteria KLEBSIELLA PNEUMONIAE  Final      Susceptibility   Klebsiella pneumoniae - MIC*    AMPICILLIN RESISTANT      AMPICILLIN/SULBACTAM 4 SENSITIVE Sensitive     CEFAZOLIN <=4 SENSITIVE Sensitive     CEFEPIME <=1 SENSITIVE Sensitive     CEFTAZIDIME <=1 SENSITIVE Sensitive     CEFTRIAXONE <=1 SENSITIVE Sensitive     CIPROFLOXACIN <=0.25 SENSITIVE Sensitive     GENTAMICIN <=1 SENSITIVE Sensitive     IMIPENEM <=0.25 SENSITIVE Sensitive     PIP/TAZO <=4 SENSITIVE Sensitive     TOBRAMYCIN <=1 SENSITIVE Sensitive     TRIMETH/SULFA <=20 SENSITIVE Sensitive     * KLEBSIELLA PNEUMONIAE  Culture, blood (routine x 2)     Status: None   Collection Time: 01/12/15  4:38 PM  Result Value Ref Range Status   Specimen Description BLOOD RIGHT HAND  Final   Special Requests BOTTLES DRAWN AEROBIC  ONLY 10CC  Final   Culture   Final    NO GROWTH 5 DAYS Note: Culture results may be compromised due to an excessive volume of blood received in culture bottles. Performed at [facility]    Report Status 01/19/2015 FINAL  Final  Culture, blood (single)     Status: None   Collection Time: 01/13/15  6:28 PM  Result Value Ref Range Status   Specimen Description BLOOD LEFT ARM  Final   Special Requests BOTTLES DRAWN AEROBIC AND ANAEROBIC 10CC  Final   Culture   Final    NO GROWTH 5 DAYS Note: Culture results may be compromised due to an excessive volume of blood received in culture bottles. Performed at [facility]    Report Status 01/20/2015 FINAL  Final  Clostridium Difficile by PCR     Status: None   Collection Time: 01/15/15  9:29 AM  Result Value Ref Range Status   C difficile by pcr NEGATIVE NEGATIVE Final  Culture, blood (routine x 2)     Status: None (Preliminary result)   Collection Time: 01/19/15  4:00 AM  Result Value Ref Range Status   Specimen Description BLOOD LEFT ANTECUBITAL  Final   Special Requests BOTTLES DRAWN AEROBIC AND ANAEROBIC 10CC  Final   Culture   Final           BLOOD CULTURE RECEIVED NO GROWTH TO DATE CULTURE WILL BE HELD FOR 5 DAYS BEFORE ISSUING A FINAL NEGATIVE REPORT Performed at [facility]    Report Status PENDING  Incomplete  Culture, blood (routine x 2)     Status: None (Preliminary result)   Collection Time: 01/19/15  4:10 AM  Result Value Ref Range Status   Specimen Description BLOOD RIGHT ANTECUBITAL  Final   Special Requests BOTTLES DRAWN AEROBIC AND ANAEROBIC 5CC  Final   Culture   Final           BLOOD CULTURE RECEIVED NO GROWTH TO DATE CULTURE WILL BE HELD FOR 5 DAYS BEFORE ISSUING A FINAL NEGATIVE REPORT Performed at [facility]    Report Status PENDING  Incomplete  Respiratory virus panel     Status: None   Collection Time: 01/19/15  5:02 AM  Result Value Ref Range Status   Source - RVPAN NASAL  SWAB  Corrected   Respiratory Syncytial Virus A Negative Negative Final   Respiratory Syncytial Virus B Negative Negative Final   Influenza A Negative Negative Final   Influenza B Negative Negative Final   Parainfluenza 1 Negative Negative Final   Parainfluenza 2 Negative Negative Final   Parainfluenza 3 Negative Negative Final   Metapneumovirus Negative Negative Final   Rhinovirus Negative Negative Final   Adenovirus Negative Negative Final    Comment: (NOTE) Performed At: [facility] [place], [place] [id number] Lindon Romp MD WI:[id number]   Clostridium Difficile by PCR     Status: None   Collection Time: 01/19/15  8:00 PM  Result Value Ref Range Status   C difficile by pcr NEGATIVE NEGATIVE Final     Labs: Basic Metabolic Panel:  Recent Labs Lab 01/19/15 0207 01/20/15 0409 01/21/15 0310  NA 133* 134* 138  K 3.6 3.3* 3.7  CL 102 105 105  CO2 20* 22 23  GLUCOSE 197* 113* 101*  BUN 16 10 8   CREATININE 1.33* 0.91 0.80  CALCIUM 8.7* 8.1* 8.4*   Liver Function Tests:  Recent Labs Lab 01/19/15 0207  AST 47*  ALT 65*  ALKPHOS 165*  BILITOT 0.5  PROT 6.5  ALBUMIN 3.1*   No results for input(s): LIPASE, AMYLASE in the last 168 hours. No results for input(s): AMMONIA in the last 168 hours. CBC:  Recent Labs Lab 01/19/15 0207 01/20/15 0409 01/21/15 0310 01/22/15 1010  WBC 18.3* 20.9* 16.8* 13.7*  NEUTROABS 17.4*  --   --   --   HGB 12.4* 10.3* 11.1* 12.2*  HCT 36.8* 30.6* 33.1* 36.6*  MCV 84.8 85.7 85.5 86.1  PLT 221 208 227 291   Cardiac Enzymes: No results for input(s): CKTOTAL, CKMB, CKMBINDEX, TROPONINI in the last 168 hours. BNP: BNP (last 3 results) No results for input(s): BNP in the last 8760 hours.  ProBNP (last 3 results) No results for input(s): PROBNP in the last 8760 hours.  CBG:  Recent Labs Lab 01/21/15 1147 01/21/15 1634 01/21/15 2149 01/22/15 0633 01/22/15 1103  GLUCAP 118* 145* 166* 151* 146*         Signed:  Zonnique Norkus  [facility] 01/22/2015, 12:58 PM

## 2015-01-22 NOTE — Progress Notes (Addendum)
ANTIBIOTIC CONSULT NOTE - INITIAL  Pharmacy Consult for Cefepime and vancomycin Indication: Sepsis/ Pneumonia  Allergies  Allergen Reactions  . Acetaminophen     Drug induced hepatitis  . Oxycodone-Acetaminophen     Drug induced hepatitis    Patient Measurements: Height: 5\' 7"  (170.2 cm) Weight: 184 lb 12.8 oz (83.825 kg) IBW/kg (Calculated) : 66.1   Vital Signs: Temp: 98.1 F (36.7 C) (05/22 0637) Temp Source: Oral (05/22 2376) BP: 152/73 mmHg (05/22 2831) Pulse Rate: 71 (05/22 0637) Intake/Output from previous day: 05/21 0701 - 05/22 0700 In: 3930 [P.O.:1080; I.V.:2400; IV Piggyback:450] Out: 300 [Urine:300] Intake/Output from this shift:    Labs:  Recent Labs  01/20/15 0409 01/21/15 0310  WBC 20.9* 16.8*  HGB 10.3* 11.1*  PLT 208 227  CREATININE 0.91 0.80   Estimated Creatinine Clearance: 83.9 mL/min (by C-G formula based on Cr of 0.8). No results for input(s): VANCOTROUGH, VANCOPEAK, VANCORANDOM, GENTTROUGH, GENTPEAK, GENTRANDOM, TOBRATROUGH, TOBRAPEAK, TOBRARND, AMIKACINPEAK, AMIKACINTROU, AMIKACIN in the last 72 hours.   Microbiology: Recent Results (from the past 720 hour(s))  Culture, blood (routine x 2)     Status: None   Collection Time: 01/12/15 12:19 AM  Result Value Ref Range Status   Specimen Description BLOOD RIGHT ARM  Final   Special Requests   Final    BOTTLES DRAWN AEROBIC AND ANAEROBIC BLUE 10CC RED 5CC   Culture   Final    NO GROWTH 5 DAYS Note: Culture results may be compromised due to an excessive volume of blood received in culture bottles. Performed at [facility]    Report Status 01/18/2015 FINAL  Final  Culture, blood (routine x 2)     Status: None   Collection Time: 01/12/15 12:26 AM  Result Value Ref Range Status   Specimen Description BLOOD RIGHT HAND  Final   Special Requests BOTTLES DRAWN AEROBIC ONLY 10CC  Final   Culture   Final    NO GROWTH 5 DAYS Performed at [facility]    Report Status  01/18/2015 FINAL  Final  Urine culture     Status: None   Collection Time: 01/12/15 12:52 AM  Result Value Ref Range Status   Specimen Description URINE, CATHETERIZED  Final   Special Requests NONE  Final   Colony Count   Final    >=100,000 COLONIES/ML Performed at [facility]    Culture   Final    KLEBSIELLA PNEUMONIAE Performed at [facility]    Report Status 01/14/2015 FINAL  Final   Organism ID, Bacteria KLEBSIELLA PNEUMONIAE  Final      Susceptibility   Klebsiella pneumoniae - MIC*    AMPICILLIN >=32 RESISTANT Resistant     CEFAZOLIN <=4 SENSITIVE Sensitive     CEFTRIAXONE <=1 SENSITIVE Sensitive     CIPROFLOXACIN <=0.25 SENSITIVE Sensitive     GENTAMICIN <=1 SENSITIVE Sensitive     LEVOFLOXACIN <=0.12 SENSITIVE Sensitive     NITROFURANTOIN <=16 SENSITIVE Sensitive     TOBRAMYCIN <=1 SENSITIVE Sensitive     TRIMETH/SULFA <=20 SENSITIVE Sensitive     PIP/TAZO <=4 SENSITIVE Sensitive     * KLEBSIELLA PNEUMONIAE  MRSA PCR Screening     Status: None   Collection Time: 01/12/15  6:42 AM  Result Value Ref Range Status   MRSA by PCR NEGATIVE NEGATIVE Final    Comment:        The GeneXpert MRSA Assay (FDA approved for NASAL specimens only), is one component of a comprehensive MRSA  colonization surveillance program. It is not intended to diagnose MRSA infection nor to guide or monitor treatment for MRSA infections.   Culture, blood (routine x 2)     Status: None   Collection Time: 01/12/15  4:30 PM  Result Value Ref Range Status   Specimen Description BLOOD RIGHT ANTECUBITAL  Final   Special Requests BOTTLES DRAWN AEROBIC AND ANAEROBIC 10CC  Final   Culture   Final    KLEBSIELLA PNEUMONIAE Note: Culture results may be compromised due to an excessive volume of blood received in culture bottles. Gram Stain Report Called to,Read Back By and Verified With: Su Hilt 01/18/15 0845 BY COOKV Performed at [facility]    Report Status  01/19/2015 FINAL  Final   Organism ID, Bacteria KLEBSIELLA PNEUMONIAE  Final      Susceptibility   Klebsiella pneumoniae - MIC*    AMPICILLIN RESISTANT      AMPICILLIN/SULBACTAM 4 SENSITIVE Sensitive     CEFAZOLIN <=4 SENSITIVE Sensitive     CEFEPIME <=1 SENSITIVE Sensitive     CEFTAZIDIME <=1 SENSITIVE Sensitive     CEFTRIAXONE <=1 SENSITIVE Sensitive     CIPROFLOXACIN <=0.25 SENSITIVE Sensitive     GENTAMICIN <=1 SENSITIVE Sensitive     IMIPENEM <=0.25 SENSITIVE Sensitive     PIP/TAZO <=4 SENSITIVE Sensitive     TOBRAMYCIN <=1 SENSITIVE Sensitive     TRIMETH/SULFA <=20 SENSITIVE Sensitive     * KLEBSIELLA PNEUMONIAE  Culture, blood (routine x 2)     Status: None   Collection Time: 01/12/15  4:38 PM  Result Value Ref Range Status   Specimen Description BLOOD RIGHT HAND  Final   Special Requests BOTTLES DRAWN AEROBIC ONLY 10CC  Final   Culture   Final    NO GROWTH 5 DAYS Note: Culture results may be compromised due to an excessive volume of blood received in culture bottles. Performed at [facility]    Report Status 01/19/2015 FINAL  Final  Culture, blood (single)     Status: None   Collection Time: 01/13/15  6:28 PM  Result Value Ref Range Status   Specimen Description BLOOD LEFT ARM  Final   Special Requests BOTTLES DRAWN AEROBIC AND ANAEROBIC 10CC  Final   Culture   Final    NO GROWTH 5 DAYS Note: Culture results may be compromised due to an excessive volume of blood received in culture bottles. Performed at [facility]    Report Status 01/20/2015 FINAL  Final  Clostridium Difficile by PCR     Status: None   Collection Time: 01/15/15  9:29 AM  Result Value Ref Range Status   C difficile by pcr NEGATIVE NEGATIVE Final  Culture, blood (routine x 2)     Status: None (Preliminary result)   Collection Time: 01/19/15  4:00 AM  Result Value Ref Range Status   Specimen Description BLOOD LEFT ANTECUBITAL  Final   Special Requests BOTTLES DRAWN AEROBIC AND  ANAEROBIC 10CC  Final   Culture   Final           BLOOD CULTURE RECEIVED NO GROWTH TO DATE CULTURE WILL BE HELD FOR 5 DAYS BEFORE ISSUING A FINAL NEGATIVE REPORT Performed at [facility]    Report Status PENDING  Incomplete  Culture, blood (routine x 2)     Status: None (Preliminary result)   Collection Time: 01/19/15  4:10 AM  Result Value Ref Range Status   Specimen Description BLOOD RIGHT ANTECUBITAL  Final  Special Requests BOTTLES DRAWN AEROBIC AND ANAEROBIC 5CC  Final   Culture   Final           BLOOD CULTURE RECEIVED NO GROWTH TO DATE CULTURE WILL BE HELD FOR 5 DAYS BEFORE ISSUING A FINAL NEGATIVE REPORT Performed at [facility]    Report Status PENDING  Incomplete  Respiratory virus panel     Status: None   Collection Time: 01/19/15  5:02 AM  Result Value Ref Range Status   Source - RVPAN NASAL SWAB  Corrected   Respiratory Syncytial Virus A Negative Negative Final   Respiratory Syncytial Virus B Negative Negative Final   Influenza A Negative Negative Final   Influenza B Negative Negative Final   Parainfluenza 1 Negative Negative Final   Parainfluenza 2 Negative Negative Final   Parainfluenza 3 Negative Negative Final   Metapneumovirus Negative Negative Final   Rhinovirus Negative Negative Final   Adenovirus Negative Negative Final    Comment: (NOTE) Performed At: [facility] [place], [place] [id number] Lindon Romp MD MM:[id number]   Clostridium Difficile by PCR     Status: None   Collection Time: 01/19/15  8:00 PM  Result Value Ref Range Status   C difficile by pcr NEGATIVE NEGATIVE Final    Medical History: Past Medical History  Diagnosis Date  . CAD (coronary artery disease)     Dr Peter Martinique  . HTN (hypertension)   . HLD (hyperlipidemia)   . DM (diabetes mellitus)   . ED (erectile dysfunction)   . Carotid stenosis   . Obesity   . OSA (obstructive sleep apnea)     oral appliance, Dr Ron Parker  . Paroxysmal  atrial fibrillation   . Complication of anesthesia     " DIFFICULTY WAKING "  . Myocardial infarction 2007   Assessment: 74 y.o male presented to ED due to fever. He was recently here on 5/12 and had klebsiella bacteremia/pyelo at that time. He was dc on PO septra for it. This means that he got around 7 days of total abx. D/w Dr. Ree Kida today and we will de-escalate abx vanc/cefepime to ceftin to cover for both bacteremia and PNA to complete his 14 days of abx.   Goal:  Vancomycin Trough = 15-74mcg/ml  Plan:   Cefepime 2gm IV q12h Vancomycin 750 mg IV q12h For now until anticipated dc today  Onnie Boer, PharmD Pager: [phone] 01/22/2015 11:09 AM

## 2015-01-23 ENCOUNTER — Telehealth: Payer: Self-pay

## 2015-01-23 NOTE — Telephone Encounter (Signed)
Reporting positive on blood culture-----has gram negative rods for this patient, recently released from hospital---please advise, thanks

## 2015-01-24 ENCOUNTER — Encounter (HOSPITAL_COMMUNITY): Payer: Self-pay

## 2015-01-24 ENCOUNTER — Telehealth: Payer: Self-pay | Admitting: Cardiology

## 2015-01-24 ENCOUNTER — Telehealth: Payer: Self-pay | Admitting: *Deleted

## 2015-01-24 DIAGNOSIS — N320 Bladder-neck obstruction: Secondary | ICD-10-CM | POA: Diagnosis not present

## 2015-01-24 DIAGNOSIS — R339 Retention of urine, unspecified: Secondary | ICD-10-CM | POA: Diagnosis not present

## 2015-01-24 NOTE — Telephone Encounter (Signed)
corrected date pt was d/c 01/22/15...Johny Chess

## 2015-01-24 NOTE — Telephone Encounter (Signed)
Hi Jacob Curry,  Telephone d/c date is incorrect, can you correct this. Thanks, [employer]

## 2015-01-24 NOTE — Telephone Encounter (Signed)
Patient visited office late 5/23 pm to sign authorization/pmt for FMLA forms for Daughter Jacob Curry.  5.24.16 Sent signed release,FMLA (matrix) and check pmt to [employer] [facility] [place] to process. lp

## 2015-01-24 NOTE — Telephone Encounter (Addendum)
Transition Care Management Follow-up Telephone Call D/C 01/22/15  How have you been since you were released from the hospital? Pt states he is feeling alright   Do you understand why you were in the hospital? YES   Do you understand the discharge instrcutions? YES  Items Reviewed:  Medications reviewed: YES  Allergies reviewed: YES  Dietary changes reviewed: YES, heart healthy  Referrals reviewed: No referral needed   Functional Questionnaire:   Activities of Daily Living (ADLs):   He states he are independent in the following: ambulation, bathing and hygiene, feeding, continence, grooming, toileting and dressing States he doesn't require assistance    Any transportation issues/concerns?: NO   Any patient concerns? NO   Confirmed importance and date/time of follow-up visits scheduled: YES, appt made for 01/31/15 w/Dr. Linna Darner   Confirmed with patient if condition begins to worsen call PCP or go to the ER.  YES

## 2015-01-25 LAB — CULTURE, BLOOD (ROUTINE X 2): Culture: NO GROWTH

## 2015-01-26 ENCOUNTER — Encounter (HOSPITAL_COMMUNITY): Payer: Self-pay

## 2015-01-26 LAB — CULTURE, BLOOD (ROUTINE X 2)

## 2015-01-27 ENCOUNTER — Encounter (HOSPITAL_COMMUNITY): Payer: Self-pay

## 2015-01-31 ENCOUNTER — Encounter: Payer: Self-pay | Admitting: Internal Medicine

## 2015-01-31 ENCOUNTER — Encounter (HOSPITAL_COMMUNITY): Payer: Self-pay

## 2015-01-31 ENCOUNTER — Ambulatory Visit (INDEPENDENT_AMBULATORY_CARE_PROVIDER_SITE_OTHER): Payer: Medicare Other | Admitting: Internal Medicine

## 2015-01-31 VITALS — BP 142/78 | HR 70 | Temp 97.8°F | Wt 185.5 lb

## 2015-01-31 DIAGNOSIS — J189 Pneumonia, unspecified organism: Secondary | ICD-10-CM | POA: Diagnosis not present

## 2015-01-31 DIAGNOSIS — A419 Sepsis, unspecified organism: Secondary | ICD-10-CM | POA: Diagnosis not present

## 2015-01-31 DIAGNOSIS — E1151 Type 2 diabetes mellitus with diabetic peripheral angiopathy without gangrene: Secondary | ICD-10-CM | POA: Diagnosis not present

## 2015-01-31 DIAGNOSIS — E1159 Type 2 diabetes mellitus with other circulatory complications: Secondary | ICD-10-CM

## 2015-01-31 NOTE — Patient Instructions (Addendum)
Please  blowup at least 5  balloons a day to enhance inflation of the lungs and to help resolve the effusion as we discussed.  A1c in mid to late August 2016.

## 2015-01-31 NOTE — Progress Notes (Signed)
   Subjective:    Patient ID: Jacob Curry, male    DOB: 1941/04/22, 74 y.o.   MRN: [id number]  HPI  He was hospitalized 5/19-5/22 with sepsis secondary to HCAP versus Klebsiella bacteremia. Initial imaging suggested bibasilar infiltrates; R/O HCAP. CT revealed what appeared to be a nodule on plain films was a rhomboid fossa sclerotic lesion in inferior left clavicle. He had small right and minimal left pleural effusions on the CT. He had been hospitalized 5/11-5/15 with KLebsiella pyelonephritis and sepsis. At the time of discharge urine culture was negative but he was readmitted with temperature up to 103 on 5/19. Blood culture collected 5/12 revealed Klebsiella; this was reported 5/19. On 5/22 he was discharged on cefuroxime which he completed 5/27.  Since discharge other than weakness he's been asymptomatic.  In the hospital he had markedly labile glucoses. He received sliding scale insulin as needed. His last A1c was 6.6% on 5/12.    Review of Systems He denies fever, chills, sweats at this time.  He has no cough, hemoptysis, or sputum production.  Chest pain, palpitations, tachycardia, exertional dyspnea, paroxysmal nocturnal dyspnea, claudication or edema are absent.  Dysuria, pyuria, hematuria, frequency, nocturia or polyuria are denied.He had polyuria and incontinence with Flomax & Rapiflo.      Objective:   Physical Exam  General appearance :adequately nourished; in no distress.  Eyes: No conjunctival inflammation or scleral icterus is present.  Oral exam:  Lips and gums are healthy appearing.There is no oropharyngeal erythema or exudate noted. Dental hygiene is good.  Heart:  Normal rate and regular rhythm. S1 and S2 normal without gallop, murmur, click, rub or other extra sounds    Lungs:Chest clear to auscultation; no wheezes, rhonchi,rales ,or rubs present.No increased work of breathing.   Abdomen: Protuberant; bowel sounds normal, soft and non-tender without  masses, organomegaly or hernias noted.  No guarding or rebound. No flank tenderness to percussion.  Vascular : all pulses equal ; no bruits present.  Skin:Warm & dry.  Intact without suspicious lesions or rashes ; no tenting or jaundice   Lymphatic: No lymphadenopathy is noted about the head, neck, axilla  Neuro: Strength, tone & DTRs normal.        Assessment & Plan:  See Current Assessment & Plan in Problem List under specific Diagnosis

## 2015-01-31 NOTE — Progress Notes (Signed)
Pre visit review using our clinic review tool, if applicable. No additional management support is needed unless otherwise documented below in the visit note. 

## 2015-01-31 NOTE — Assessment & Plan Note (Signed)
Modified incentive spirometry recommended  No follow-up chest x-rays unless he develops pulmonary symptoms

## 2015-02-01 NOTE — Assessment & Plan Note (Signed)
01/31/15 clinically stable & asymptomatic

## 2015-02-02 ENCOUNTER — Encounter (HOSPITAL_COMMUNITY): Payer: Self-pay

## 2015-02-02 DIAGNOSIS — I4891 Unspecified atrial fibrillation: Secondary | ICD-10-CM | POA: Insufficient documentation

## 2015-02-02 DIAGNOSIS — I2510 Atherosclerotic heart disease of native coronary artery without angina pectoris: Secondary | ICD-10-CM | POA: Insufficient documentation

## 2015-02-02 NOTE — Telephone Encounter (Signed)
6.2.16 Received completed FMLA form for daughter back from [employer] [facility] [place] forms to Ardis Rowan RN for Dr Martinique to review, complete and sign.

## 2015-02-03 ENCOUNTER — Encounter (HOSPITAL_COMMUNITY): Payer: Self-pay

## 2015-02-06 ENCOUNTER — Encounter: Payer: Self-pay | Admitting: Cardiology

## 2015-02-06 ENCOUNTER — Ambulatory Visit (INDEPENDENT_AMBULATORY_CARE_PROVIDER_SITE_OTHER): Payer: Medicare Other | Admitting: Cardiology

## 2015-02-06 VITALS — BP 122/58 | HR 72 | Ht 67.0 in | Wt 187.3 lb

## 2015-02-06 DIAGNOSIS — I25119 Atherosclerotic heart disease of native coronary artery with unspecified angina pectoris: Secondary | ICD-10-CM

## 2015-02-06 DIAGNOSIS — E785 Hyperlipidemia, unspecified: Secondary | ICD-10-CM | POA: Diagnosis not present

## 2015-02-06 DIAGNOSIS — I10 Essential (primary) hypertension: Secondary | ICD-10-CM

## 2015-02-06 DIAGNOSIS — E1151 Type 2 diabetes mellitus with diabetic peripheral angiopathy without gangrene: Secondary | ICD-10-CM

## 2015-02-06 DIAGNOSIS — E1159 Type 2 diabetes mellitus with other circulatory complications: Secondary | ICD-10-CM

## 2015-02-06 MED ORDER — AMLODIPINE BESYLATE 2.5 MG PO TABS: 2.5000 mg | ORAL_TABLET | Freq: Every day | ORAL | Status: DC

## 2015-02-06 NOTE — Patient Instructions (Signed)
Continue your other therapy  I will see you in 6 months

## 2015-02-07 ENCOUNTER — Encounter (HOSPITAL_COMMUNITY)
Admission: RE | Admit: 2015-02-07 | Discharge: 2015-02-07 | Disposition: A | Discharge status: Home / Self Care | Payer: Self-pay | Source: Ambulatory Visit | Attending: Cardiology | Admitting: Cardiology

## 2015-02-07 NOTE — Progress Notes (Signed)
[name] Date of Birth: 24-Nov-1940 Medical Record #[id number]  History of Present Illness: Jacob Curry is seen for a post hospital follow up. He has a known history of coronary disease with cardiac catheterization in November 2007 showing total occlusion of a large left circumflex vessel with good left to left collaterals. There was 40% disease in the left main and origin of LAD. 90% mid diagonal, 80-90% mid RCA and 90% RV marginal branch.  Ejection fraction was 50-55%. Prior to this he had a nuclear stress test- he was able to exercise 8:30 with severe ischemia of the inferolateral wall. He has been managed medically. Repeat cardiac cath in May 2011 showed no significant change. He has a history of hypertension, hyperlipidemia, Pafib,  diabetes. He has a history of moderate obstructive sleep apnea. This is managed with an oral appliance. He was unable to tolerate CPAP therapy.  He is anticoagulated with Eliquis.  He was admitted in early May with Klebsiella sepsis. He had demand ischemia related to this. Cardiac cath repeated and was unchanged from prior studies. He was DC but later readmitted with HCAP. He feels he is finally getting back to normal. He still tires easily. Denies any chest pain, SOB, edema, or palpitations.    Current Outpatient Prescriptions on File Prior to Visit  Medication Sig Dispense Refill  . apixaban (ELIQUIS) 5 MG TABS tablet Take 1 tablet (5 mg total) by mouth 2 (two) times daily. 60 tablet 5  . carvedilol (COREG) 12.5 MG tablet TAKE 1 AND 1/2 TABLETS TWICE DAILY WITH MEALS 270 tablet 2  . fish oil-omega-3 fatty acids 1000 MG capsule Take 2 g by mouth daily.      Marland Kitchen losartan (COZAAR) 100 MG tablet TAKE 1 TABLET BY MOUTH EVERY DAY 90 tablet 2  . metFORMIN (GLUCOPHAGE) 500 MG tablet TAKE ONE TABLET BY MOUTH EVERY MORNING 90 tablet 1  . nitroGLYCERIN (NITROSTAT) 0.4 MG SL tablet Place 1 tablet (0.4 mg total) under the tongue every 5 (five) minutes as needed. 25 tablet 11   . saccharomyces boulardii (FLORASTOR) 250 MG capsule Take 1 capsule (250 mg total) by mouth 2 (two) times daily. 30 capsule 0  . Saw Palmetto, Serenoa repens, (SAW PALMETTO PO) Take 1 tablet by mouth daily.      . simvastatin (ZOCOR) 40 MG tablet Take 1 tablet (40 mg total) by mouth at bedtime. 90 tablet 3   No current facility-administered medications on file prior to visit.    Allergies  Allergen Reactions  . Acetaminophen     Drug induced hepatitis  . Oxycodone-Acetaminophen     Drug induced hepatitis  . Flomax [Tamsulosin Hcl]     incontinence    Past Medical History  Diagnosis Date  . CAD (coronary artery disease)     Dr Peter Martinique  . HTN (hypertension)   . HLD (hyperlipidemia)   . DM (diabetes mellitus)   . ED (erectile dysfunction)   . Carotid stenosis   . Obesity   . OSA (obstructive sleep apnea)     oral appliance, Dr Ron Parker  . Paroxysmal atrial fibrillation   . Complication of anesthesia     " DIFFICULTY WAKING "  . Myocardial infarction 2007    Past Surgical History  Procedure Laterality Date  . Appendectomy  1958  . Ankle fracture surgery  1993  . Hand surgery  1965  . Tonsillectomy    . Cardiac catheterization  2007 & 2011  . Colonoscopy  2002  negative; Dr Olevia Perches  . Cardiac catheterization N/A 01/13/2015    Procedure: Left Heart Cath and Coronary Angiography;  Surgeon: Peter M Martinique, MD;  Location: [facility];  Service: Cardiovascular;  Laterality: N/A;    History  Smoking status  . Former Smoker -- 1.00 packs/day  . Quit date: 09/02/1990  Smokeless tobacco  . Never Used    Comment: onset age [age] up to 1 ppd    History  Alcohol Use No    Family History  Problem Relation Age of Onset  . Diabetes Mother   . Colon cancer Paternal Uncle   . Heart attack Father      4 vessel CBAG @ 74  . Diabetes Brother   . Stroke Neg Hx     Review of Systems: As noted in history of present illness.  All other systems were reviewed and  are negative.  Physical Exam: BP 122/58 mmHg  Pulse 72  Ht 5\' 7"  (1.702 m)  Wt 84.959 kg (187 lb 4.8 oz)  BMI 29.33 kg/m2 He is a pleasant white male in no acute distress. HEENT exam is unremarkable. He has no jugular venous distention or bruits. There is no adenopathy or thyromegaly. Cardiac exam reveals a regular rate and rhythm. There is no  gallop, murmur, or click. PMI is normal. Lungs are clear. Abdomen is soft and nontender without masses or bruits. He has normal bowel sounds. Extremities are without cyanosis or edema. Pedal pulses are 2+ and symmetric. He is alert and oriented x3. Cranial nerves II through XII are intact. He has no focal findings.  LABORATORY DATA: Lab Results  Component Value Date   WBC 13.7* 01/22/2015   HGB 12.2* 01/22/2015   HCT 36.6* 01/22/2015   PLT 291 01/22/2015   GLUCOSE 101* 01/21/2015   CHOL 97 12/21/2014   TRIG 101.0 12/21/2014   HDL 42.80 12/21/2014   LDLDIRECT 39.9 07/22/2008   LDLCALC 34 12/21/2014   ALT 65* 01/19/2015   AST 47* 01/19/2015   NA 138 01/21/2015   K 3.7 01/21/2015   CL 105 01/21/2015   CREATININE 0.80 01/21/2015   BUN 8 01/21/2015   CO2 23 01/21/2015   TSH 2.27 03/09/2012   PSA 1.70 01/04/2009   INR 1.75* 01/12/2015   HGBA1C 6.6* 01/12/2015   MICROALBUR 0.4 03/09/2012    Assessment / Plan: 1. 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 Moderate to severe 3 vessel disease. Patient is asymptomatic on medical therapy. Cardiac cath recently in May unchanged.  Will continue medical therapy and monitor for any new symptoms.Recent symptoms in hospital clearly related to demand ischemia with sepsis.  He is on optimal medical therapy.  2. Hypertension, controlled.  3. Hyperlipidemia well controlled on medication.  His lipids are excellent.  4. Atrial fibrillation, paroxysmal. Patient is  in sinus rhythm. His rate was controlled on beta blocker therapy. He was minimally symptomatic. He does have a high  Chadvasc score of 4. Continue anticoagulation with Eliquis 5 mg twice a day.   5. DM type 2.   6. Klebsiella sepsis- resolved.  7. Hospital acquired PNA- improved.   I will follow up in 6 months with fasting lab.

## 2015-02-09 ENCOUNTER — Encounter (HOSPITAL_COMMUNITY)
Admission: RE | Admit: 2015-02-09 | Discharge: 2015-02-09 | Disposition: A | Discharge status: Home / Self Care | Payer: Self-pay | Source: Ambulatory Visit | Attending: Cardiology | Admitting: Cardiology

## 2015-02-10 ENCOUNTER — Encounter (HOSPITAL_COMMUNITY)
Admission: RE | Admit: 2015-02-10 | Discharge: 2015-02-10 | Disposition: A | Discharge status: Home / Self Care | Payer: Self-pay | Source: Ambulatory Visit | Attending: Cardiology | Admitting: Cardiology

## 2015-02-14 ENCOUNTER — Encounter (HOSPITAL_COMMUNITY)
Admission: RE | Admit: 2015-02-14 | Discharge: 2015-02-14 | Disposition: A | Discharge status: Home / Self Care | Payer: Self-pay | Source: Ambulatory Visit | Attending: Cardiology | Admitting: Cardiology

## 2015-02-16 ENCOUNTER — Encounter (HOSPITAL_COMMUNITY)
Admission: RE | Admit: 2015-02-16 | Discharge: 2015-02-16 | Disposition: A | Discharge status: Home / Self Care | Payer: Self-pay | Source: Ambulatory Visit | Attending: Cardiology | Admitting: Cardiology

## 2015-02-17 ENCOUNTER — Encounter (HOSPITAL_COMMUNITY)
Admission: RE | Admit: 2015-02-17 | Discharge: 2015-02-17 | Disposition: A | Discharge status: Home / Self Care | Payer: Self-pay | Source: Ambulatory Visit | Attending: Cardiology | Admitting: Cardiology

## 2015-02-21 ENCOUNTER — Encounter (HOSPITAL_COMMUNITY)
Admission: RE | Admit: 2015-02-21 | Discharge: 2015-02-21 | Disposition: A | Discharge status: Home / Self Care | Payer: Self-pay | Source: Ambulatory Visit | Attending: Cardiology | Admitting: Cardiology

## 2015-02-23 ENCOUNTER — Encounter (HOSPITAL_COMMUNITY)
Admission: RE | Admit: 2015-02-23 | Discharge: 2015-02-23 | Disposition: A | Discharge status: Home / Self Care | Payer: Self-pay | Source: Ambulatory Visit | Attending: Cardiology | Admitting: Cardiology

## 2015-02-24 ENCOUNTER — Encounter (HOSPITAL_COMMUNITY)
Admission: RE | Admit: 2015-02-24 | Discharge: 2015-02-24 | Disposition: A | Discharge status: Home / Self Care | Payer: Self-pay | Source: Ambulatory Visit | Attending: Cardiology | Admitting: Cardiology

## 2015-02-27 ENCOUNTER — Encounter (HOSPITAL_COMMUNITY)
Admission: RE | Admit: 2015-02-27 | Discharge: 2015-02-27 | Disposition: A | Discharge status: Home / Self Care | Payer: Self-pay | Source: Ambulatory Visit | Attending: Cardiology | Admitting: Cardiology

## 2015-02-28 ENCOUNTER — Encounter (HOSPITAL_COMMUNITY): Payer: Self-pay

## 2015-03-02 ENCOUNTER — Encounter (HOSPITAL_COMMUNITY): Payer: Self-pay

## 2015-03-03 ENCOUNTER — Encounter (HOSPITAL_COMMUNITY): Payer: Self-pay

## 2015-03-03 DIAGNOSIS — I2510 Atherosclerotic heart disease of native coronary artery without angina pectoris: Secondary | ICD-10-CM | POA: Insufficient documentation

## 2015-03-03 DIAGNOSIS — I4891 Unspecified atrial fibrillation: Secondary | ICD-10-CM | POA: Insufficient documentation

## 2015-03-07 ENCOUNTER — Encounter (HOSPITAL_COMMUNITY): Payer: Self-pay

## 2015-03-08 ENCOUNTER — Encounter (HOSPITAL_COMMUNITY)
Admission: RE | Admit: 2015-03-08 | Discharge: 2015-03-08 | Disposition: A | Discharge status: Home / Self Care | Payer: Self-pay | Source: Ambulatory Visit | Attending: Cardiology | Admitting: Cardiology

## 2015-03-09 ENCOUNTER — Encounter (HOSPITAL_COMMUNITY)
Admission: RE | Admit: 2015-03-09 | Discharge: 2015-03-09 | Disposition: A | Discharge status: Home / Self Care | Payer: Self-pay | Source: Ambulatory Visit | Attending: Cardiology | Admitting: Cardiology

## 2015-03-10 ENCOUNTER — Encounter (HOSPITAL_COMMUNITY)
Admission: RE | Admit: 2015-03-10 | Discharge: 2015-03-10 | Disposition: A | Discharge status: Home / Self Care | Payer: Self-pay | Source: Ambulatory Visit | Attending: Cardiology | Admitting: Cardiology

## 2015-03-13 ENCOUNTER — Ambulatory Visit (INDEPENDENT_AMBULATORY_CARE_PROVIDER_SITE_OTHER): Payer: Medicare Other | Admitting: Internal Medicine

## 2015-03-13 ENCOUNTER — Encounter: Payer: Self-pay | Admitting: Internal Medicine

## 2015-03-13 VITALS — BP 128/82 | HR 66 | Temp 97.7°F | Resp 18 | Wt 185.0 lb

## 2015-03-13 DIAGNOSIS — R05 Cough: Secondary | ICD-10-CM

## 2015-03-13 DIAGNOSIS — R059 Cough, unspecified: Secondary | ICD-10-CM

## 2015-03-13 DIAGNOSIS — J310 Chronic rhinitis: Secondary | ICD-10-CM

## 2015-03-13 DIAGNOSIS — I25119 Atherosclerotic heart disease of native coronary artery with unspecified angina pectoris: Secondary | ICD-10-CM

## 2015-03-13 MED ORDER — AZITHROMYCIN 250 MG PO TABS: ORAL_TABLET | ORAL | Status: DC

## 2015-03-13 MED ORDER — BENZONATATE 200 MG PO CAPS: 200.0000 mg | ORAL_CAPSULE | Freq: Three times a day (TID) | ORAL | Status: DC | PRN

## 2015-03-13 NOTE — Progress Notes (Signed)
   Subjective:    Patient ID: Jacob Curry, male    DOB: 1940-10-13, 74 y.o.   MRN: [id number]  HPI  He has had a "chest cold" for 3 days. The cough is nonproductive but wakes him at night. Ibuprofen 2 tabs this am provided some limited benefit for symptoms (Note: on Eliquis; risk with NSAIDS discussed). The cough worsens when patient is supine because of postnasal drainage. He has no upper respiratory infection symptoms. He denies any extrinsic symptoms.  Blood sugars range 105-132 fasting.  He goes to cardiac rehabilitation 3 times a week.  In patient 5/19-22 with HAP & sepsis.   Review of Systems Frontal headache, facial pain , nasal purulence, dental pain, sore throat , otic pain or otic discharge denied. No fever , chills or sweats. Extrinsic symptoms of itchy, watery eyes, sneezing, or angioedema are denied. There is no wheezing or  paroxysmal nocturnal dyspnea.     Objective:   Physical Exam  General appearance :adequately nourished; in no distress.  Eyes: No conjunctival inflammation or scleral icterus is present.  Oral exam:  Lips and gums are healthy appearing.There is no oropharyngeal erythema or exudate noted. Dental hygiene is good.  Heart:  Normal rate and regular rhythm. S1 and S2 normal without gallop, murmur, click, rub or other extra sounds    Lungs: He has very faint scattered expiratory wheezing, mainly at the left lower lobe.No increased work of breathing.   Abdomen: Protuberant;bowel sounds normal, soft and non-tender without masses, organomegaly or hernias noted.  No guarding or rebound.   Vascular : all pulses equal ; no bruits present.  Skin:Warm & dry.  Intact without suspicious lesions or rashes ; no tenting or jaundice   Lymphatic: No lymphadenopathy is noted about the head, neck, axilla.   Neuro: Strength, tone normal.         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 Bacterial infection is not suggested at this  time despite his complicated past history.  #2 diabetes, excellent control  Plan: See orders and recommendations

## 2015-03-13 NOTE — Progress Notes (Signed)
Pre visit review using our clinic review tool, if applicable. No additional management support is needed unless otherwise documented below in the visit note. 

## 2015-03-13 NOTE — Patient Instructions (Signed)
Zicam Melts or Zinc lozenges as per package label for sore throat .  Complementary options to boost immunity include  vitamin C 2000 mg daily; & Echinacea for 4-7 days.  Fill the  prescription for antibiotic if fever; discolored nasal or chest secretions; or frontal headache or facial pain  present   Plain Mucinex (NOT D) for thick secretions ;force NON dairy fluids .   Nasal cleansing in the shower as discussed with lather of mild shampoo.After 10 seconds wash off lather while  exhaling through nostrils. Make sure that all residual soap is removed to prevent irritation.  Flonase OR Nasacort AQ 1 spray in each nostril twice a day as needed. Use the "crossover" technique into opposite nostril spraying toward opposite ear @ 45 degree angle, not straight up into nostril.  Plain Allegra (NOT D )  160 daily , Loratidine 10 mg , OR Zyrtec 10 mg @ bedtime  as needed for itchy eyes & sneezing.  To use Breo: Pull cap down to release medication. Blow out as much as possible then inhale powder as deeply as possible. Hold breath to count of ten then exhale.  Gargle and spit after use. Lot #: [id number] Expiration date:09/2016

## 2015-03-14 ENCOUNTER — Encounter (HOSPITAL_COMMUNITY)
Admission: RE | Admit: 2015-03-14 | Discharge: 2015-03-14 | Disposition: A | Discharge status: Home / Self Care | Payer: Self-pay | Source: Ambulatory Visit | Attending: Cardiology | Admitting: Cardiology

## 2015-03-16 ENCOUNTER — Encounter (HOSPITAL_COMMUNITY)
Admission: RE | Admit: 2015-03-16 | Discharge: 2015-03-16 | Disposition: A | Discharge status: Home / Self Care | Payer: Self-pay | Source: Ambulatory Visit | Attending: Cardiology | Admitting: Cardiology

## 2015-03-17 ENCOUNTER — Encounter (HOSPITAL_COMMUNITY)
Admission: RE | Admit: 2015-03-17 | Discharge: 2015-03-17 | Disposition: A | Discharge status: Home / Self Care | Payer: Self-pay | Source: Ambulatory Visit | Attending: Cardiology | Admitting: Cardiology

## 2015-03-21 ENCOUNTER — Encounter (HOSPITAL_COMMUNITY)
Admission: RE | Admit: 2015-03-21 | Discharge: 2015-03-21 | Disposition: A | Discharge status: Home / Self Care | Payer: Self-pay | Source: Ambulatory Visit | Attending: Cardiology | Admitting: Cardiology

## 2015-03-23 ENCOUNTER — Encounter (HOSPITAL_COMMUNITY)
Admission: RE | Admit: 2015-03-23 | Discharge: 2015-03-23 | Disposition: A | Discharge status: Home / Self Care | Payer: Self-pay | Source: Ambulatory Visit | Attending: Cardiology | Admitting: Cardiology

## 2015-03-24 ENCOUNTER — Encounter (HOSPITAL_COMMUNITY)
Admission: RE | Admit: 2015-03-24 | Discharge: 2015-03-24 | Disposition: A | Discharge status: Home / Self Care | Payer: Self-pay | Source: Ambulatory Visit | Attending: Cardiology | Admitting: Cardiology

## 2015-03-28 ENCOUNTER — Encounter (HOSPITAL_COMMUNITY)
Admission: RE | Admit: 2015-03-28 | Discharge: 2015-03-28 | Disposition: A | Discharge status: Home / Self Care | Payer: Self-pay | Source: Ambulatory Visit | Attending: Cardiology | Admitting: Cardiology

## 2015-03-29 ENCOUNTER — Encounter (HOSPITAL_COMMUNITY)
Admission: RE | Admit: 2015-03-29 | Discharge: 2015-03-29 | Disposition: A | Discharge status: Home / Self Care | Payer: Self-pay | Source: Ambulatory Visit | Attending: Cardiology | Admitting: Cardiology

## 2015-03-30 ENCOUNTER — Encounter (HOSPITAL_COMMUNITY): Payer: Self-pay

## 2015-03-31 ENCOUNTER — Encounter (HOSPITAL_COMMUNITY)
Admission: RE | Admit: 2015-03-31 | Discharge: 2015-03-31 | Disposition: A | Discharge status: Home / Self Care | Payer: Self-pay | Source: Ambulatory Visit | Attending: Cardiology | Admitting: Cardiology

## 2015-04-04 ENCOUNTER — Encounter (HOSPITAL_COMMUNITY)
Admission: RE | Admit: 2015-04-04 | Discharge: 2015-04-04 | Disposition: A | Discharge status: Home / Self Care | Payer: Self-pay | Source: Ambulatory Visit | Attending: Cardiology | Admitting: Cardiology

## 2015-04-04 DIAGNOSIS — I209 Angina pectoris, unspecified: Secondary | ICD-10-CM | POA: Insufficient documentation

## 2015-04-06 ENCOUNTER — Encounter (HOSPITAL_COMMUNITY)
Admission: RE | Admit: 2015-04-06 | Discharge: 2015-04-06 | Disposition: A | Discharge status: Home / Self Care | Payer: Self-pay | Source: Ambulatory Visit | Attending: Cardiology | Admitting: Cardiology

## 2015-04-07 ENCOUNTER — Encounter (HOSPITAL_COMMUNITY)
Admission: RE | Admit: 2015-04-07 | Discharge: 2015-04-07 | Disposition: A | Discharge status: Home / Self Care | Payer: Self-pay | Source: Ambulatory Visit | Attending: Cardiology | Admitting: Cardiology

## 2015-04-11 ENCOUNTER — Encounter (HOSPITAL_COMMUNITY)
Admission: RE | Admit: 2015-04-11 | Discharge: 2015-04-11 | Disposition: A | Discharge status: Home / Self Care | Payer: Self-pay | Source: Ambulatory Visit | Attending: Cardiology | Admitting: Cardiology

## 2015-04-13 ENCOUNTER — Encounter (HOSPITAL_COMMUNITY)
Admission: RE | Admit: 2015-04-13 | Discharge: 2015-04-13 | Disposition: A | Discharge status: Home / Self Care | Payer: Self-pay | Source: Ambulatory Visit | Attending: Cardiology | Admitting: Cardiology

## 2015-04-14 ENCOUNTER — Encounter (HOSPITAL_COMMUNITY)
Admission: RE | Admit: 2015-04-14 | Discharge: 2015-04-14 | Disposition: A | Discharge status: Home / Self Care | Payer: Self-pay | Source: Ambulatory Visit | Attending: Cardiology | Admitting: Cardiology

## 2015-04-18 ENCOUNTER — Encounter (HOSPITAL_COMMUNITY)
Admission: RE | Admit: 2015-04-18 | Discharge: 2015-04-18 | Disposition: A | Discharge status: Home / Self Care | Payer: Self-pay | Source: Ambulatory Visit | Attending: Cardiology | Admitting: Cardiology

## 2015-04-20 ENCOUNTER — Encounter (HOSPITAL_COMMUNITY): Payer: Self-pay

## 2015-04-21 ENCOUNTER — Encounter (HOSPITAL_COMMUNITY)
Admission: RE | Admit: 2015-04-21 | Discharge: 2015-04-21 | Disposition: A | Discharge status: Home / Self Care | Payer: Self-pay | Source: Ambulatory Visit | Attending: Cardiology | Admitting: Cardiology

## 2015-04-25 ENCOUNTER — Encounter (HOSPITAL_COMMUNITY)
Admission: RE | Admit: 2015-04-25 | Discharge: 2015-04-25 | Disposition: A | Discharge status: Home / Self Care | Payer: Self-pay | Source: Ambulatory Visit | Attending: Cardiology | Admitting: Cardiology

## 2015-04-27 ENCOUNTER — Encounter (HOSPITAL_COMMUNITY)
Admission: RE | Admit: 2015-04-27 | Discharge: 2015-04-27 | Disposition: A | Discharge status: Home / Self Care | Payer: Self-pay | Source: Ambulatory Visit | Attending: Cardiology | Admitting: Cardiology

## 2015-04-28 ENCOUNTER — Encounter (HOSPITAL_COMMUNITY)
Admission: RE | Admit: 2015-04-28 | Discharge: 2015-04-28 | Disposition: A | Discharge status: Home / Self Care | Payer: Self-pay | Source: Ambulatory Visit | Attending: Cardiology | Admitting: Cardiology

## 2015-05-01 ENCOUNTER — Other Ambulatory Visit (INDEPENDENT_AMBULATORY_CARE_PROVIDER_SITE_OTHER): Payer: Medicare Other

## 2015-05-01 DIAGNOSIS — E1151 Type 2 diabetes mellitus with diabetic peripheral angiopathy without gangrene: Secondary | ICD-10-CM

## 2015-05-01 LAB — HEMOGLOBIN A1C: Hgb A1c MFr Bld: 5.9 % (ref 4.6–6.5)

## 2015-05-02 ENCOUNTER — Other Ambulatory Visit: Payer: Self-pay | Admitting: Cardiology

## 2015-05-02 ENCOUNTER — Encounter (HOSPITAL_COMMUNITY)
Admission: RE | Admit: 2015-05-02 | Discharge: 2015-05-02 | Disposition: A | Discharge status: Home / Self Care | Payer: Self-pay | Source: Ambulatory Visit | Attending: Cardiology | Admitting: Cardiology

## 2015-05-02 NOTE — Telephone Encounter (Signed)
Rx request sent to pharmacy.  

## 2015-05-04 ENCOUNTER — Encounter (HOSPITAL_COMMUNITY)
Admission: RE | Admit: 2015-05-04 | Discharge: 2015-05-04 | Disposition: A | Discharge status: Home / Self Care | Payer: Self-pay | Source: Ambulatory Visit | Attending: Cardiology | Admitting: Cardiology

## 2015-05-04 DIAGNOSIS — I209 Angina pectoris, unspecified: Secondary | ICD-10-CM | POA: Insufficient documentation

## 2015-05-05 ENCOUNTER — Encounter (HOSPITAL_COMMUNITY)
Admission: RE | Admit: 2015-05-05 | Discharge: 2015-05-05 | Disposition: A | Discharge status: Home / Self Care | Payer: Self-pay | Source: Ambulatory Visit | Attending: Cardiology | Admitting: Cardiology

## 2015-05-09 ENCOUNTER — Encounter (HOSPITAL_COMMUNITY): Payer: Medicare Other

## 2015-05-11 ENCOUNTER — Encounter (HOSPITAL_COMMUNITY)
Admission: RE | Admit: 2015-05-11 | Discharge: 2015-05-11 | Disposition: A | Discharge status: Home / Self Care | Payer: Self-pay | Source: Ambulatory Visit | Attending: Cardiology | Admitting: Cardiology

## 2015-05-12 ENCOUNTER — Encounter (HOSPITAL_COMMUNITY)
Admission: RE | Admit: 2015-05-12 | Discharge: 2015-05-12 | Disposition: A | Discharge status: Home / Self Care | Payer: Self-pay | Source: Ambulatory Visit | Attending: Cardiology | Admitting: Cardiology

## 2015-05-16 ENCOUNTER — Encounter (HOSPITAL_COMMUNITY)
Admission: RE | Admit: 2015-05-16 | Discharge: 2015-05-16 | Disposition: A | Discharge status: Home / Self Care | Payer: Self-pay | Source: Ambulatory Visit | Attending: Cardiology | Admitting: Cardiology

## 2015-05-18 ENCOUNTER — Encounter (HOSPITAL_COMMUNITY)
Admission: RE | Admit: 2015-05-18 | Discharge: 2015-05-18 | Disposition: A | Discharge status: Home / Self Care | Payer: Self-pay | Source: Ambulatory Visit | Attending: Cardiology | Admitting: Cardiology

## 2015-05-19 ENCOUNTER — Encounter (HOSPITAL_COMMUNITY)
Admission: RE | Admit: 2015-05-19 | Discharge: 2015-05-19 | Disposition: A | Discharge status: Home / Self Care | Payer: Self-pay | Source: Ambulatory Visit | Attending: Cardiology | Admitting: Cardiology

## 2015-05-23 ENCOUNTER — Encounter (HOSPITAL_COMMUNITY)
Admission: RE | Admit: 2015-05-23 | Discharge: 2015-05-23 | Disposition: A | Discharge status: Home / Self Care | Payer: Self-pay | Source: Ambulatory Visit | Attending: Cardiology | Admitting: Cardiology

## 2015-05-25 ENCOUNTER — Encounter (HOSPITAL_COMMUNITY)
Admission: RE | Admit: 2015-05-25 | Discharge: 2015-05-25 | Disposition: A | Discharge status: Home / Self Care | Payer: Self-pay | Source: Ambulatory Visit | Attending: Cardiology | Admitting: Cardiology

## 2015-05-26 ENCOUNTER — Encounter (HOSPITAL_COMMUNITY)
Admission: RE | Admit: 2015-05-26 | Discharge: 2015-05-26 | Disposition: A | Discharge status: Home / Self Care | Payer: Self-pay | Source: Ambulatory Visit | Attending: Cardiology | Admitting: Cardiology

## 2015-05-30 ENCOUNTER — Encounter (HOSPITAL_COMMUNITY)
Admission: RE | Admit: 2015-05-30 | Discharge: 2015-05-30 | Disposition: A | Discharge status: Home / Self Care | Payer: Self-pay | Source: Ambulatory Visit | Attending: Cardiology | Admitting: Cardiology

## 2015-06-01 ENCOUNTER — Encounter (HOSPITAL_COMMUNITY)
Admission: RE | Admit: 2015-06-01 | Discharge: 2015-06-01 | Disposition: A | Discharge status: Home / Self Care | Payer: Self-pay | Source: Ambulatory Visit | Attending: Cardiology | Admitting: Cardiology

## 2015-06-02 ENCOUNTER — Encounter (HOSPITAL_COMMUNITY)
Admission: RE | Admit: 2015-06-02 | Discharge: 2015-06-02 | Disposition: A | Discharge status: Home / Self Care | Payer: Self-pay | Source: Ambulatory Visit | Attending: Cardiology | Admitting: Cardiology

## 2015-06-06 ENCOUNTER — Encounter (HOSPITAL_COMMUNITY)
Admission: RE | Admit: 2015-06-06 | Discharge: 2015-06-06 | Disposition: A | Discharge status: Home / Self Care | Payer: Self-pay | Source: Ambulatory Visit | Attending: Cardiology | Admitting: Cardiology

## 2015-06-06 DIAGNOSIS — I209 Angina pectoris, unspecified: Secondary | ICD-10-CM | POA: Insufficient documentation

## 2015-06-08 ENCOUNTER — Encounter (HOSPITAL_COMMUNITY)
Admission: RE | Admit: 2015-06-08 | Discharge: 2015-06-08 | Disposition: A | Discharge status: Home / Self Care | Payer: Self-pay | Source: Ambulatory Visit | Attending: Cardiology | Admitting: Cardiology

## 2015-06-08 ENCOUNTER — Other Ambulatory Visit: Payer: Self-pay | Admitting: Cardiology

## 2015-06-08 NOTE — Telephone Encounter (Signed)
Rx request sent to pharmacy.  

## 2015-06-09 ENCOUNTER — Encounter (HOSPITAL_COMMUNITY)
Admission: RE | Admit: 2015-06-09 | Discharge: 2015-06-09 | Disposition: A | Discharge status: Home / Self Care | Payer: Self-pay | Source: Ambulatory Visit | Attending: Cardiology | Admitting: Cardiology

## 2015-06-13 ENCOUNTER — Encounter (HOSPITAL_COMMUNITY)
Admission: RE | Admit: 2015-06-13 | Discharge: 2015-06-13 | Disposition: A | Discharge status: Home / Self Care | Payer: Self-pay | Source: Ambulatory Visit | Attending: Cardiology | Admitting: Cardiology

## 2015-06-15 ENCOUNTER — Encounter (HOSPITAL_COMMUNITY)
Admission: RE | Admit: 2015-06-15 | Discharge: 2015-06-15 | Disposition: A | Discharge status: Home / Self Care | Payer: Self-pay | Source: Ambulatory Visit | Attending: Cardiology | Admitting: Cardiology

## 2015-06-16 ENCOUNTER — Encounter (HOSPITAL_COMMUNITY)
Admission: RE | Admit: 2015-06-16 | Discharge: 2015-06-16 | Disposition: A | Discharge status: Home / Self Care | Payer: Self-pay | Source: Ambulatory Visit | Attending: Cardiology | Admitting: Cardiology

## 2015-06-20 ENCOUNTER — Encounter (HOSPITAL_COMMUNITY)
Admission: RE | Admit: 2015-06-20 | Discharge: 2015-06-20 | Disposition: A | Discharge status: Home / Self Care | Payer: Self-pay | Source: Ambulatory Visit | Attending: Cardiology | Admitting: Cardiology

## 2015-06-22 ENCOUNTER — Encounter (HOSPITAL_COMMUNITY)
Admission: RE | Admit: 2015-06-22 | Discharge: 2015-06-22 | Disposition: A | Discharge status: Home / Self Care | Payer: Self-pay | Source: Ambulatory Visit | Attending: Cardiology | Admitting: Cardiology

## 2015-06-22 ENCOUNTER — Other Ambulatory Visit: Payer: Self-pay

## 2015-06-22 ENCOUNTER — Telehealth (INDEPENDENT_AMBULATORY_CARE_PROVIDER_SITE_OTHER): Payer: Medicare Other

## 2015-06-22 DIAGNOSIS — Z23 Encounter for immunization: Secondary | ICD-10-CM | POA: Diagnosis not present

## 2015-06-22 NOTE — Telephone Encounter (Signed)
Patient came to office with his wife requesting flu vaccine.Flu vaccine given.

## 2015-06-23 ENCOUNTER — Encounter (HOSPITAL_COMMUNITY)
Admission: RE | Admit: 2015-06-23 | Discharge: 2015-06-23 | Disposition: A | Discharge status: Home / Self Care | Payer: Self-pay | Source: Ambulatory Visit | Attending: Cardiology | Admitting: Cardiology

## 2015-06-27 ENCOUNTER — Encounter (HOSPITAL_COMMUNITY)
Admission: RE | Admit: 2015-06-27 | Discharge: 2015-06-27 | Disposition: A | Discharge status: Home / Self Care | Payer: Self-pay | Source: Ambulatory Visit | Attending: Cardiology | Admitting: Cardiology

## 2015-06-29 ENCOUNTER — Encounter (HOSPITAL_COMMUNITY)
Admission: RE | Admit: 2015-06-29 | Discharge: 2015-06-29 | Disposition: A | Discharge status: Home / Self Care | Payer: Self-pay | Source: Ambulatory Visit | Attending: Cardiology | Admitting: Cardiology

## 2015-06-30 ENCOUNTER — Encounter (HOSPITAL_COMMUNITY): Payer: Self-pay

## 2015-07-04 ENCOUNTER — Encounter (HOSPITAL_COMMUNITY)
Admission: RE | Admit: 2015-07-04 | Discharge: 2015-07-04 | Disposition: A | Discharge status: Home / Self Care | Payer: Self-pay | Source: Ambulatory Visit | Attending: Cardiology | Admitting: Cardiology

## 2015-07-04 DIAGNOSIS — I209 Angina pectoris, unspecified: Secondary | ICD-10-CM | POA: Insufficient documentation

## 2015-07-06 ENCOUNTER — Encounter (HOSPITAL_COMMUNITY)
Admission: RE | Admit: 2015-07-06 | Discharge: 2015-07-06 | Disposition: A | Discharge status: Home / Self Care | Payer: Self-pay | Source: Ambulatory Visit | Attending: Cardiology | Admitting: Cardiology

## 2015-07-07 ENCOUNTER — Encounter (HOSPITAL_COMMUNITY)
Admission: RE | Admit: 2015-07-07 | Discharge: 2015-07-07 | Disposition: A | Discharge status: Home / Self Care | Payer: Self-pay | Source: Ambulatory Visit | Attending: Cardiology | Admitting: Cardiology

## 2015-07-11 ENCOUNTER — Encounter (HOSPITAL_COMMUNITY)
Admission: RE | Admit: 2015-07-11 | Discharge: 2015-07-11 | Disposition: A | Discharge status: Home / Self Care | Payer: Self-pay | Source: Ambulatory Visit | Attending: Cardiology | Admitting: Cardiology

## 2015-07-13 ENCOUNTER — Encounter (HOSPITAL_COMMUNITY)
Admission: RE | Admit: 2015-07-13 | Discharge: 2015-07-13 | Disposition: A | Discharge status: Home / Self Care | Payer: Self-pay | Source: Ambulatory Visit | Attending: Cardiology | Admitting: Cardiology

## 2015-07-14 ENCOUNTER — Encounter (HOSPITAL_COMMUNITY)
Admission: RE | Admit: 2015-07-14 | Discharge: 2015-07-14 | Disposition: A | Discharge status: Home / Self Care | Payer: Self-pay | Source: Ambulatory Visit | Attending: Cardiology | Admitting: Cardiology

## 2015-07-18 ENCOUNTER — Encounter (HOSPITAL_COMMUNITY)
Admission: RE | Admit: 2015-07-18 | Discharge: 2015-07-18 | Disposition: A | Discharge status: Home / Self Care | Payer: Self-pay | Source: Ambulatory Visit | Attending: Cardiology | Admitting: Cardiology

## 2015-07-19 ENCOUNTER — Other Ambulatory Visit: Payer: Self-pay | Admitting: Internal Medicine

## 2015-07-20 ENCOUNTER — Encounter (HOSPITAL_COMMUNITY)
Admission: RE | Admit: 2015-07-20 | Discharge: 2015-07-20 | Disposition: A | Discharge status: Home / Self Care | Payer: Self-pay | Source: Ambulatory Visit | Attending: Cardiology | Admitting: Cardiology

## 2015-07-21 ENCOUNTER — Encounter (HOSPITAL_COMMUNITY)
Admission: RE | Admit: 2015-07-21 | Discharge: 2015-07-21 | Disposition: A | Discharge status: Home / Self Care | Payer: Self-pay | Source: Ambulatory Visit | Attending: Cardiology | Admitting: Cardiology

## 2015-07-24 ENCOUNTER — Other Ambulatory Visit (INDEPENDENT_AMBULATORY_CARE_PROVIDER_SITE_OTHER): Payer: Medicare Other

## 2015-07-24 ENCOUNTER — Encounter: Payer: Self-pay | Admitting: Internal Medicine

## 2015-07-24 ENCOUNTER — Ambulatory Visit (INDEPENDENT_AMBULATORY_CARE_PROVIDER_SITE_OTHER): Payer: Medicare Other | Admitting: Internal Medicine

## 2015-07-24 VITALS — BP 145/85 | HR 66 | Wt 191.0 lb

## 2015-07-24 DIAGNOSIS — I25119 Atherosclerotic heart disease of native coronary artery with unspecified angina pectoris: Secondary | ICD-10-CM

## 2015-07-24 DIAGNOSIS — E785 Hyperlipidemia, unspecified: Secondary | ICD-10-CM | POA: Diagnosis not present

## 2015-07-24 DIAGNOSIS — I10 Essential (primary) hypertension: Secondary | ICD-10-CM | POA: Diagnosis not present

## 2015-07-24 DIAGNOSIS — Z86010 Personal history of colonic polyps: Secondary | ICD-10-CM | POA: Diagnosis not present

## 2015-07-24 DIAGNOSIS — Z23 Encounter for immunization: Secondary | ICD-10-CM | POA: Diagnosis not present

## 2015-07-24 LAB — LIPID PANEL
Cholesterol: 125 mg/dL (ref 0–200)
HDL: 49.7 mg/dL (ref >39.00)
LDL Cholesterol: 45 mg/dL (ref 0–99)
NonHDL: 75.06
Total CHOL/HDL Ratio: 3
Triglycerides: 152 mg/dL — ABNORMAL HIGH (ref 0.0–149.0)
VLDL: 30.4 mg/dL (ref 0.0–40.0)

## 2015-07-24 LAB — HEPATIC FUNCTION PANEL
ALT: 26 U/L (ref 0–53)
AST: 20 U/L (ref 0–37)
Albumin: 4.2 g/dL (ref 3.5–5.2)
Alkaline Phosphatase: 87 U/L (ref 39–117)
Bilirubin, Direct: 0.1 mg/dL (ref 0.0–0.3)
Total Bilirubin: 0.7 mg/dL (ref 0.2–1.2)
Total Protein: 7.2 g/dL (ref 6.0–8.3)

## 2015-07-24 LAB — BASIC METABOLIC PANEL
BUN: 13 mg/dL (ref 6–23)
CO2: 26 mEq/L (ref 19–32)
Calcium: 9.4 mg/dL (ref 8.4–10.5)
Chloride: 105 mEq/L (ref 96–112)
Creatinine, Ser: 0.98 mg/dL (ref 0.40–1.50)
GFR: 79.35 mL/min (ref >60.00)
Glucose, Bld: 117 mg/dL — ABNORMAL HIGH (ref 70–99)
Potassium: 4 mEq/L (ref 3.5–5.1)
Sodium: 140 mEq/L (ref 135–145)

## 2015-07-24 LAB — TSH: TSH: 2.08 u[IU]/mL (ref 0.35–4.50)

## 2015-07-24 NOTE — Assessment & Plan Note (Signed)
Blood pressure goals reviewed. BMET 

## 2015-07-24 NOTE — Assessment & Plan Note (Signed)
CBC

## 2015-07-24 NOTE — Patient Instructions (Signed)
Minimal Blood Pressure Goal= AVERAGE < 140/90;  Ideal is an AVERAGE < 135/85. This AVERAGE should be calculated from @ least 5-7 BP readings taken @ different times of day on different days of week. You should not respond to isolated BP readings , but rather the AVERAGE for that week .Please bring your  blood pressure cuff to office visits to verify that it is reliable.It  can also be checked against the blood pressure device at the pharmacy. Finger or wrist cuffs are not dependable; an arm cuff is. Your next office appointment will be determined based upon review of your pending labs .  Those written interpretation of the lab results and instructions will be transmitted to you by My Chart   Critical results will be called.   Followup as needed for any active or acute issue. Please report any significant change in your symptoms. 

## 2015-07-24 NOTE — Progress Notes (Signed)
Pre visit review using our clinic review tool, if applicable. No additional management support is needed unless otherwise documented below in the visit note. 

## 2015-07-24 NOTE — Progress Notes (Signed)
   Subjective:    Patient ID: Jacob Curry, male    DOB: [birth year], 74 y.o.   MRN: [id number]  HPI The patient is here to assess status of active health conditions.  PMH, FH, & Social History reviewed & updated.No change in Campus as recorded.Marland Kitchen  He exercises at cardiac rehabilitation 3 times a week and also does yardwork without associated cardiopulmonary symptoms.  Blood pressure at cardiac rehabilitation is in the 120s over 60-80. 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  He had polyps removed at colonoscopy in January. He is to return in 2020  He is followed by Dr. Rosana Hoes, Urology.   Review of Systems  Chest pain, palpitations, tachycardia, exertional dyspnea, paroxysmal nocturnal dyspnea, claudication or edema are absent. No unexplained weight loss, abdominal pain, significant dyspepsia, dysphagia, melena, rectal bleeding, or persistently small caliber stools. Dysuria, pyuria, hematuria, frequency, nocturia or polyuria are denied. Change in hair, skin, nails denied. No bowel changes of constipation or diarrhea. No intolerance to heat or cold.     Objective:   Physical Exam Pertinent or positive findings include: Pattern alopecia is present. He has bilateral ptosis. Abdomen is protuberant. Genitourinary exam is deferred to his urologist.  General appearance :adequately nourished; in no distress.  Eyes: No conjunctival inflammation or scleral icterus is present.  Oral exam:  Lips and gums are healthy appearing.There is no oropharyngeal erythema or exudate noted. Dental hygiene is good.  Heart:  Normal rate and regular rhythm. S1 and S2 normal without gallop, murmur, click, rub or other extra sounds    Lungs:Chest clear to auscultation; no wheezes, rhonchi,rales ,or rubs present.No increased work of breathing.   Abdomen: bowel sounds normal, soft and non-tender without masses, organomegaly or hernias noted.  No guarding or rebound.   Vascular : all pulses equal ; no bruits  present.  Skin:Warm & dry.  Intact without suspicious lesions or rashes ; no tenting or jaundice   Lymphatic: No lymphadenopathy is noted about the head, neck, axilla.   Neuro: Strength, tone & DTRs normal.     Assessment & Plan:  See Current Assessment & Plan in Problem List under specific Diagnosis

## 2015-07-24 NOTE — Assessment & Plan Note (Signed)
Lipids, LFTs, TSH  

## 2015-07-25 ENCOUNTER — Encounter (HOSPITAL_COMMUNITY)
Admission: RE | Admit: 2015-07-25 | Discharge: 2015-07-25 | Disposition: A | Discharge status: Home / Self Care | Payer: Self-pay | Source: Ambulatory Visit | Attending: Cardiology | Admitting: Cardiology

## 2015-07-25 LAB — CBC WITH DIFFERENTIAL/PLATELET
Basophils Absolute: 0 10*3/uL (ref 0.0–0.1)
Basophils Relative: 0.4 % (ref 0.0–3.0)
Eosinophils Absolute: 0.2 10*3/uL (ref 0.0–0.7)
Eosinophils Relative: 1.7 % (ref 0.0–5.0)
HCT: 44.4 % (ref 39.0–52.0)
Hemoglobin: 14.9 g/dL (ref 13.0–17.0)
Lymphocytes Relative: 13.8 % (ref 12.0–46.0)
Lymphs Abs: 1.9 10*3/uL (ref 0.7–4.0)
MCHC: 33.5 g/dL (ref 30.0–36.0)
MCV: 87.5 fl (ref 78.0–100.0)
Monocytes Absolute: 1.1 10*3/uL — ABNORMAL HIGH (ref 0.1–1.0)
Monocytes Relative: 8 % (ref 3.0–12.0)
Neutro Abs: 10.4 10*3/uL — ABNORMAL HIGH (ref 1.4–7.7)
Neutrophils Relative %: 76.1 % (ref 43.0–77.0)
Platelets: 175 10*3/uL (ref 150.0–400.0)
RBC: 5.08 Mil/uL (ref 4.22–5.81)
RDW: 14.4 % (ref 11.5–15.5)
WBC: 13.6 10*3/uL — ABNORMAL HIGH (ref 4.0–10.5)

## 2015-08-01 ENCOUNTER — Encounter (HOSPITAL_COMMUNITY)
Admission: RE | Admit: 2015-08-01 | Discharge: 2015-08-01 | Disposition: A | Discharge status: Home / Self Care | Payer: Self-pay | Source: Ambulatory Visit | Attending: Cardiology | Admitting: Cardiology

## 2015-08-03 ENCOUNTER — Encounter (HOSPITAL_COMMUNITY)
Admission: RE | Admit: 2015-08-03 | Discharge: 2015-08-03 | Disposition: A | Discharge status: Home / Self Care | Payer: Self-pay | Source: Ambulatory Visit | Attending: Cardiology | Admitting: Cardiology

## 2015-08-03 DIAGNOSIS — I209 Angina pectoris, unspecified: Secondary | ICD-10-CM | POA: Insufficient documentation

## 2015-08-04 ENCOUNTER — Encounter (HOSPITAL_COMMUNITY)
Admission: RE | Admit: 2015-08-04 | Discharge: 2015-08-04 | Disposition: A | Discharge status: Home / Self Care | Payer: Self-pay | Source: Ambulatory Visit | Attending: Cardiology | Admitting: Cardiology

## 2015-08-04 DIAGNOSIS — A419 Sepsis, unspecified organism: Secondary | ICD-10-CM | POA: Diagnosis not present

## 2015-08-04 DIAGNOSIS — N3020 Other chronic cystitis without hematuria: Secondary | ICD-10-CM | POA: Diagnosis not present

## 2015-08-04 DIAGNOSIS — N433 Hydrocele, unspecified: Secondary | ICD-10-CM | POA: Insufficient documentation

## 2015-08-04 DIAGNOSIS — R339 Retention of urine, unspecified: Secondary | ICD-10-CM | POA: Diagnosis not present

## 2015-08-04 DIAGNOSIS — N401 Enlarged prostate with lower urinary tract symptoms: Secondary | ICD-10-CM | POA: Diagnosis not present

## 2015-08-04 DIAGNOSIS — N529 Male erectile dysfunction, unspecified: Secondary | ICD-10-CM | POA: Diagnosis not present

## 2015-08-08 ENCOUNTER — Encounter (HOSPITAL_COMMUNITY)
Admission: RE | Admit: 2015-08-08 | Discharge: 2015-08-08 | Disposition: A | Discharge status: Home / Self Care | Payer: Self-pay | Source: Ambulatory Visit | Attending: Cardiology | Admitting: Cardiology

## 2015-08-10 ENCOUNTER — Encounter (HOSPITAL_COMMUNITY)
Admission: RE | Admit: 2015-08-10 | Discharge: 2015-08-10 | Disposition: A | Discharge status: Home / Self Care | Payer: Self-pay | Source: Ambulatory Visit | Attending: Cardiology | Admitting: Cardiology

## 2015-08-11 ENCOUNTER — Encounter (HOSPITAL_COMMUNITY)
Admission: RE | Admit: 2015-08-11 | Discharge: 2015-08-11 | Disposition: A | Discharge status: Home / Self Care | Payer: Self-pay | Source: Ambulatory Visit | Attending: Cardiology | Admitting: Cardiology

## 2015-08-15 ENCOUNTER — Encounter (HOSPITAL_COMMUNITY)
Admission: RE | Admit: 2015-08-15 | Discharge: 2015-08-15 | Disposition: A | Discharge status: Home / Self Care | Payer: Self-pay | Source: Ambulatory Visit | Attending: Cardiology | Admitting: Cardiology

## 2015-08-16 ENCOUNTER — Ambulatory Visit (INDEPENDENT_AMBULATORY_CARE_PROVIDER_SITE_OTHER): Payer: Medicare Other | Admitting: Cardiology

## 2015-08-16 ENCOUNTER — Encounter: Payer: Self-pay | Admitting: Cardiology

## 2015-08-16 VITALS — BP 114/60 | HR 66 | Ht 68.0 in | Wt 198.4 lb

## 2015-08-16 DIAGNOSIS — I481 Persistent atrial fibrillation: Secondary | ICD-10-CM

## 2015-08-16 DIAGNOSIS — I4819 Other persistent atrial fibrillation: Secondary | ICD-10-CM

## 2015-08-16 DIAGNOSIS — I2510 Atherosclerotic heart disease of native coronary artery without angina pectoris: Secondary | ICD-10-CM

## 2015-08-16 DIAGNOSIS — I25119 Atherosclerotic heart disease of native coronary artery with unspecified angina pectoris: Secondary | ICD-10-CM

## 2015-08-16 DIAGNOSIS — I10 Essential (primary) hypertension: Secondary | ICD-10-CM | POA: Diagnosis not present

## 2015-08-16 DIAGNOSIS — E785 Hyperlipidemia, unspecified: Secondary | ICD-10-CM

## 2015-08-16 NOTE — Patient Instructions (Signed)
Continue your current therapy  Focus on losing some weight.  I will see you in 6 months.

## 2015-08-17 ENCOUNTER — Encounter (HOSPITAL_COMMUNITY)
Admission: RE | Admit: 2015-08-17 | Discharge: 2015-08-17 | Disposition: A | Discharge status: Home / Self Care | Payer: Self-pay | Source: Ambulatory Visit | Attending: Cardiology | Admitting: Cardiology

## 2015-08-17 NOTE — Progress Notes (Signed)
Jacob Curry Date of Birth: 09-28-1940 Medical Record [id number]  History of Present Illness: Jacob Curry is seen for a post hospital follow up. He has a known history of coronary disease with cardiac catheterization in November 2007 showing total occlusion of a large left circumflex vessel with good left to left collaterals. There was 40% disease in the left main and origin of LAD. 90% mid diagonal, 80-90% mid RCA and 90% RV marginal branch.  Ejection fraction was 50-55%. Prior to this he had a nuclear stress test- he was able to exercise 8:30 with severe ischemia of the inferolateral wall. He has been managed medically. Repeat cardiac cath in May 2011 and May 2016 showed no significant change. He has a history of hypertension, hyperlipidemia, Pafib,  diabetes. He has a history of moderate obstructive sleep apnea. This is managed with an oral appliance. He was unable to tolerate CPAP therapy.  He is anticoagulated with Eliquis.  On follow up today he is doing well.  Denies any chest pain, SOB, edema, or palpitations. He is active with Rehab. He has gained about 7 lbs.    Current Outpatient Prescriptions on File Prior to Visit  Medication Sig Dispense Refill  . amLODipine (NORVASC) 2.5 MG tablet Take 1 tablet (2.5 mg total) by mouth daily. 90 tablet 3  . apixaban (ELIQUIS) 5 MG TABS tablet Take 1 tablet (5 mg total) by mouth 2 (two) times daily. 60 tablet 5  . carvedilol (COREG) 12.5 MG tablet TAKE 1 AND 1/2 TABLETS TWICE DAILY WITH MEALS 270 tablet 2  . fish oil-omega-3 fatty acids 1000 MG capsule Take 2 g by mouth daily.      Marland Kitchen losartan (COZAAR) 100 MG tablet TAKE 1 TABLET EVERY DAY 90 tablet 2  . metFORMIN (GLUCOPHAGE) 500 MG tablet TAKE ONE TABLET BY MOUTH EVERY MORNING 90 tablet 1  . nitroGLYCERIN (NITROSTAT) 0.4 MG SL tablet Place 1 tablet (0.4 mg total) under the tongue every 5 (five) minutes as needed. 25 tablet 11  . Saw Palmetto, Serenoa repens, (SAW PALMETTO PO) Take 1 tablet by mouth  daily.      . simvastatin (ZOCOR) 40 MG tablet TAKE 1 TABLET BY MOUTH AT BEDTIME 90 tablet 0   No current facility-administered medications on file prior to visit.    Allergies  Allergen Reactions  . Acetaminophen     Drug induced hepatitis  . Oxycodone-Acetaminophen     Drug induced hepatitis  . Flomax [Tamsulosin Hcl]     incontinence    Past Medical History  Diagnosis Date  . CAD (coronary artery disease)     Dr Susen Haskew Martinique  . HTN (hypertension)   . HLD (hyperlipidemia)   . DM (diabetes mellitus) ([place])   . ED (erectile dysfunction)   . Carotid stenosis   . Obesity   . OSA (obstructive sleep apnea)     oral appliance, Dr Ron Parker  . Paroxysmal atrial fibrillation ([facility])   . Complication of anesthesia     " DIFFICULTY WAKING "  . Myocardial infarction [facility]) 2007    Past Surgical History  Procedure Laterality Date  . Appendectomy  1958  . Ankle fracture surgery  1993  . Hand surgery  1965  . Tonsillectomy    . Cardiac catheterization  2007 & 2011  . Colonoscopy  2002    negative; Dr Olevia Perches  . Cardiac catheterization N/A 01/13/2015    Procedure: Left Heart Cath and Coronary Angiography;  Surgeon: Ammara Raj M Martinique, MD;  Location: [facility];  Service: Cardiovascular;  Laterality: N/A;    History  Smoking status  . Former Smoker -- 1.00 packs/day  . Quit date: 09/02/1990  Smokeless tobacco  . Never Used    Comment: onset age 59-50 up to 1 ppd    History  Alcohol Use No    Family History  Problem Relation Age of Onset  . Diabetes Mother   . Colon cancer Paternal Uncle   . Heart attack Father      4 vessel CBAG @ 49  . Diabetes Brother   . Stroke Neg Hx     Review of Systems: As noted in history of present illness.  All other systems were reviewed and are negative.  Physical Exam: BP 114/60 mmHg  Pulse 66  Ht 5\' 8"  (1.727 m)  Wt 89.994 kg (198 lb 6.4 oz)  BMI 30.17 kg/m2 He is a pleasant white male in no acute distress. HEENT exam is  unremarkable. He has no jugular venous distention or bruits. There is no adenopathy or thyromegaly. Cardiac exam reveals a regular rate and rhythm. There is no  gallop, murmur, or click. PMI is normal. Lungs are clear. Abdomen is soft and nontender without masses or bruits. He has normal bowel sounds. Extremities are without cyanosis or edema. Pedal pulses are 2+ and symmetric. He is alert and oriented x3. Cranial nerves II through XII are intact. He has no focal findings.  LABORATORY DATA: Lab Results  Component Value Date   WBC 13.6* 07/24/2015   HGB 14.9 07/24/2015   HCT 44.4 07/24/2015   PLT 175.0 07/24/2015   GLUCOSE 117* 07/24/2015   CHOL 125 07/24/2015   TRIG 152.0* 07/24/2015   HDL 49.70 07/24/2015   LDLDIRECT 39.9 07/22/2008   LDLCALC 45 07/24/2015   ALT 26 07/24/2015   AST 20 07/24/2015   NA 140 07/24/2015   K 4.0 07/24/2015   CL 105 07/24/2015   CREATININE 0.98 07/24/2015   BUN 13 07/24/2015   CO2 26 07/24/2015   TSH 2.08 07/24/2015   PSA 1.70 01/04/2009   INR 1.75* 01/12/2015   HGBA1C 5.9 05/01/2015   MICROALBUR 0.4 03/09/2012    Assessment / Plan: 1. 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 Moderate to severe 3 vessel disease. Patient is asymptomatic on medical therapy. Cardiac cath recently in May 2016 unchanged from 2007 and 2011.  Will continue medical therapy and monitor for any new symptoms.  He is on optimal medical therapy.  2. Hypertension, controlled.  3. Hyperlipidemia well controlled on medication.  His lipids are excellent.  4. Atrial fibrillation, paroxysmal. Patient is  in sinus rhythm. His rate was controlled on beta blocker therapy. He was minimally symptomatic. He does have a high Chadvasc score of 4. Continue anticoagulation with Eliquis 5 mg twice a day.   5. DM type 2. On metformin   I will follow up in 6 months. Needs to focus more on weight loss.

## 2015-08-18 ENCOUNTER — Encounter (HOSPITAL_COMMUNITY)
Admission: RE | Admit: 2015-08-18 | Discharge: 2015-08-18 | Disposition: A | Discharge status: Home / Self Care | Payer: Self-pay | Source: Ambulatory Visit | Attending: Cardiology | Admitting: Cardiology

## 2015-08-22 ENCOUNTER — Encounter (HOSPITAL_COMMUNITY)
Admission: RE | Admit: 2015-08-22 | Discharge: 2015-08-22 | Disposition: A | Discharge status: Home / Self Care | Payer: Self-pay | Source: Ambulatory Visit | Attending: Cardiology | Admitting: Cardiology

## 2015-08-24 ENCOUNTER — Encounter (HOSPITAL_COMMUNITY)
Admission: RE | Admit: 2015-08-24 | Discharge: 2015-08-24 | Disposition: A | Discharge status: Home / Self Care | Payer: Self-pay | Source: Ambulatory Visit | Attending: Cardiology | Admitting: Cardiology

## 2015-08-25 ENCOUNTER — Encounter (HOSPITAL_COMMUNITY)
Admission: RE | Admit: 2015-08-25 | Discharge: 2015-08-25 | Disposition: A | Discharge status: Home / Self Care | Payer: Self-pay | Source: Ambulatory Visit | Attending: Cardiology | Admitting: Cardiology

## 2015-08-29 ENCOUNTER — Encounter (HOSPITAL_COMMUNITY)
Admission: RE | Admit: 2015-08-29 | Discharge: 2015-08-29 | Disposition: A | Discharge status: Home / Self Care | Payer: Self-pay | Source: Ambulatory Visit | Attending: Cardiology | Admitting: Cardiology

## 2015-08-31 ENCOUNTER — Encounter (HOSPITAL_COMMUNITY): Payer: Self-pay

## 2015-08-31 ENCOUNTER — Encounter: Payer: Self-pay | Admitting: *Deleted

## 2015-09-01 ENCOUNTER — Encounter (HOSPITAL_COMMUNITY): Payer: Self-pay

## 2015-09-05 ENCOUNTER — Encounter (HOSPITAL_COMMUNITY)
Admission: RE | Admit: 2015-09-05 | Discharge: 2015-09-05 | Disposition: A | Discharge status: Home / Self Care | Payer: Self-pay | Source: Ambulatory Visit | Attending: Cardiology | Admitting: Cardiology

## 2015-09-05 ENCOUNTER — Other Ambulatory Visit: Payer: Self-pay | Admitting: Cardiology

## 2015-09-05 DIAGNOSIS — I209 Angina pectoris, unspecified: Secondary | ICD-10-CM | POA: Insufficient documentation

## 2015-09-07 ENCOUNTER — Encounter (HOSPITAL_COMMUNITY)
Admission: RE | Admit: 2015-09-07 | Discharge: 2015-09-07 | Disposition: A | Discharge status: Home / Self Care | Payer: Self-pay | Source: Ambulatory Visit | Attending: Cardiology | Admitting: Cardiology

## 2015-09-08 ENCOUNTER — Encounter (HOSPITAL_COMMUNITY)
Admission: RE | Admit: 2015-09-08 | Discharge: 2015-09-08 | Disposition: A | Discharge status: Home / Self Care | Payer: Self-pay | Source: Ambulatory Visit | Attending: Cardiology | Admitting: Cardiology

## 2015-09-11 DIAGNOSIS — N3020 Other chronic cystitis without hematuria: Secondary | ICD-10-CM | POA: Diagnosis not present

## 2015-09-11 DIAGNOSIS — R339 Retention of urine, unspecified: Secondary | ICD-10-CM | POA: Diagnosis not present

## 2015-09-11 DIAGNOSIS — N3289 Other specified disorders of bladder: Secondary | ICD-10-CM | POA: Diagnosis not present

## 2015-09-12 ENCOUNTER — Encounter (HOSPITAL_COMMUNITY)
Admission: RE | Admit: 2015-09-12 | Discharge: 2015-09-12 | Disposition: A | Discharge status: Home / Self Care | Payer: Self-pay | Source: Ambulatory Visit | Attending: Cardiology | Admitting: Cardiology

## 2015-09-14 ENCOUNTER — Encounter (HOSPITAL_COMMUNITY)
Admission: RE | Admit: 2015-09-14 | Discharge: 2015-09-14 | Disposition: A | Discharge status: Home / Self Care | Payer: Self-pay | Source: Ambulatory Visit | Attending: Cardiology | Admitting: Cardiology

## 2015-09-15 ENCOUNTER — Encounter (HOSPITAL_COMMUNITY)
Admission: RE | Admit: 2015-09-15 | Discharge: 2015-09-15 | Disposition: A | Discharge status: Home / Self Care | Payer: Self-pay | Source: Ambulatory Visit | Attending: Cardiology | Admitting: Cardiology

## 2015-09-18 ENCOUNTER — Ambulatory Visit: Payer: Medicare Other | Admitting: Family Medicine

## 2015-09-19 ENCOUNTER — Encounter (HOSPITAL_COMMUNITY)
Admission: RE | Admit: 2015-09-19 | Discharge: 2015-09-19 | Disposition: A | Discharge status: Home / Self Care | Payer: Self-pay | Source: Ambulatory Visit | Attending: Cardiology | Admitting: Cardiology

## 2015-09-21 ENCOUNTER — Encounter (HOSPITAL_COMMUNITY)
Admission: RE | Admit: 2015-09-21 | Discharge: 2015-09-21 | Disposition: A | Discharge status: Home / Self Care | Payer: Self-pay | Source: Ambulatory Visit | Attending: Cardiology | Admitting: Cardiology

## 2015-09-22 ENCOUNTER — Encounter (HOSPITAL_COMMUNITY)
Admission: RE | Admit: 2015-09-22 | Discharge: 2015-09-22 | Disposition: A | Discharge status: Home / Self Care | Payer: Self-pay | Source: Ambulatory Visit | Attending: Cardiology | Admitting: Cardiology

## 2015-09-25 ENCOUNTER — Telehealth: Payer: Self-pay | Admitting: Cardiology

## 2015-09-25 DIAGNOSIS — N529 Male erectile dysfunction, unspecified: Secondary | ICD-10-CM | POA: Diagnosis not present

## 2015-09-25 DIAGNOSIS — N433 Hydrocele, unspecified: Secondary | ICD-10-CM | POA: Diagnosis not present

## 2015-09-25 DIAGNOSIS — N3020 Other chronic cystitis without hematuria: Secondary | ICD-10-CM | POA: Diagnosis not present

## 2015-09-25 DIAGNOSIS — N401 Enlarged prostate with lower urinary tract symptoms: Secondary | ICD-10-CM | POA: Diagnosis not present

## 2015-09-25 DIAGNOSIS — R338 Other retention of urine: Secondary | ICD-10-CM | POA: Diagnosis not present

## 2015-09-25 NOTE — Telephone Encounter (Signed)
Returned call to patient he stated he needs prostate surgery.Stated he wants to ask Dr.Jordan if ok to hold Eliquis.Advised I will ask Dr.Jordan and call you back 09/26/15.

## 2015-09-25 NOTE — Telephone Encounter (Signed)
Please call, would not give any details

## 2015-09-26 ENCOUNTER — Encounter (HOSPITAL_COMMUNITY)
Admission: RE | Admit: 2015-09-26 | Discharge: 2015-09-26 | Disposition: A | Discharge status: Home / Self Care | Payer: Self-pay | Source: Ambulatory Visit | Attending: Cardiology | Admitting: Cardiology

## 2015-09-28 ENCOUNTER — Telehealth: Payer: Self-pay | Admitting: Cardiology

## 2015-09-28 ENCOUNTER — Encounter (HOSPITAL_COMMUNITY)
Admission: RE | Admit: 2015-09-28 | Discharge: 2015-09-28 | Disposition: A | Discharge status: Home / Self Care | Payer: Self-pay | Source: Ambulatory Visit | Attending: Cardiology | Admitting: Cardiology

## 2015-09-28 NOTE — Telephone Encounter (Signed)
Returned call to patient.Dr.Jordan advised ok to hold Eliquis 48 hrs prior to prostate surgery.Dr.Ron Davis's office notified.

## 2015-09-28 NOTE — Telephone Encounter (Signed)
New message      Calling to remind the nurse that she was going to call pt and give him instructions on when to stop eliquis prior to a procedure.  Please call

## 2015-09-28 NOTE — Telephone Encounter (Signed)
Returned call to patient.Dr.Jordan advised ok to hold Eliquis 48 hours prior to surgery.

## 2015-09-29 ENCOUNTER — Encounter (HOSPITAL_COMMUNITY)
Admission: RE | Admit: 2015-09-29 | Discharge: 2015-09-29 | Disposition: A | Discharge status: Home / Self Care | Payer: Self-pay | Source: Ambulatory Visit | Attending: Cardiology | Admitting: Cardiology

## 2015-10-03 ENCOUNTER — Encounter (HOSPITAL_COMMUNITY)
Admission: RE | Admit: 2015-10-03 | Discharge: 2015-10-03 | Disposition: A | Discharge status: Home / Self Care | Payer: Self-pay | Source: Ambulatory Visit | Attending: Cardiology | Admitting: Cardiology

## 2015-10-06 ENCOUNTER — Encounter (HOSPITAL_COMMUNITY)
Admission: RE | Admit: 2015-10-06 | Discharge: 2015-10-06 | Disposition: A | Discharge status: Home / Self Care | Payer: Self-pay | Source: Ambulatory Visit | Attending: Cardiology | Admitting: Cardiology

## 2015-10-06 DIAGNOSIS — I2510 Atherosclerotic heart disease of native coronary artery without angina pectoris: Secondary | ICD-10-CM | POA: Insufficient documentation

## 2015-10-10 ENCOUNTER — Encounter (HOSPITAL_COMMUNITY)
Admission: RE | Admit: 2015-10-10 | Discharge: 2015-10-10 | Disposition: A | Discharge status: Home / Self Care | Payer: Self-pay | Source: Ambulatory Visit | Attending: Cardiology | Admitting: Cardiology

## 2015-10-12 ENCOUNTER — Encounter (HOSPITAL_COMMUNITY)
Admission: RE | Admit: 2015-10-12 | Discharge: 2015-10-12 | Disposition: A | Discharge status: Home / Self Care | Payer: Self-pay | Source: Ambulatory Visit | Attending: Cardiology | Admitting: Cardiology

## 2015-10-13 ENCOUNTER — Encounter (HOSPITAL_COMMUNITY)
Admission: RE | Admit: 2015-10-13 | Discharge: 2015-10-13 | Disposition: A | Discharge status: Home / Self Care | Payer: Self-pay | Source: Ambulatory Visit | Attending: Cardiology | Admitting: Cardiology

## 2015-10-17 ENCOUNTER — Encounter (HOSPITAL_COMMUNITY)
Admission: RE | Admit: 2015-10-17 | Discharge: 2015-10-17 | Disposition: A | Discharge status: Home / Self Care | Payer: Self-pay | Source: Ambulatory Visit | Attending: Cardiology | Admitting: Cardiology

## 2015-10-17 ENCOUNTER — Telehealth: Payer: Self-pay | Admitting: Cardiology

## 2015-10-17 DIAGNOSIS — Z7984 Long term (current) use of oral hypoglycemic drugs: Secondary | ICD-10-CM | POA: Diagnosis not present

## 2015-10-17 DIAGNOSIS — R001 Bradycardia, unspecified: Secondary | ICD-10-CM | POA: Diagnosis not present

## 2015-10-17 DIAGNOSIS — N529 Male erectile dysfunction, unspecified: Secondary | ICD-10-CM | POA: Diagnosis not present

## 2015-10-17 DIAGNOSIS — I4891 Unspecified atrial fibrillation: Secondary | ICD-10-CM | POA: Diagnosis not present

## 2015-10-17 DIAGNOSIS — N401 Enlarged prostate with lower urinary tract symptoms: Secondary | ICD-10-CM | POA: Diagnosis not present

## 2015-10-17 DIAGNOSIS — N3289 Other specified disorders of bladder: Secondary | ICD-10-CM | POA: Diagnosis not present

## 2015-10-17 DIAGNOSIS — R350 Frequency of micturition: Secondary | ICD-10-CM | POA: Diagnosis not present

## 2015-10-17 DIAGNOSIS — I2510 Atherosclerotic heart disease of native coronary artery without angina pectoris: Secondary | ICD-10-CM | POA: Diagnosis not present

## 2015-10-17 DIAGNOSIS — E119 Type 2 diabetes mellitus without complications: Secondary | ICD-10-CM | POA: Diagnosis not present

## 2015-10-17 DIAGNOSIS — I159 Secondary hypertension, unspecified: Secondary | ICD-10-CM | POA: Diagnosis not present

## 2015-10-17 DIAGNOSIS — Z7901 Long term (current) use of anticoagulants: Secondary | ICD-10-CM | POA: Diagnosis not present

## 2015-10-17 NOTE — Telephone Encounter (Signed)
Spoke with pt wife, pt was given the okay to hold eliquis for 2 days prior to upcoming surgery. They are currently at the pre-op evaluation at [facility], they are asking for eliquis to be held 3 days prior. Discussed with dr Martinique, okay given to hold eliquis 3 days prior to surgery.

## 2015-10-17 NOTE — Telephone Encounter (Signed)
New message    Patient calling surgery next week - at the pre-op now has a questions on medication.

## 2015-10-19 ENCOUNTER — Encounter (HOSPITAL_COMMUNITY)
Admission: RE | Admit: 2015-10-19 | Discharge: 2015-10-19 | Disposition: A | Discharge status: Home / Self Care | Payer: Self-pay | Source: Ambulatory Visit | Attending: Cardiology | Admitting: Cardiology

## 2015-10-20 ENCOUNTER — Encounter (HOSPITAL_COMMUNITY): Payer: Self-pay

## 2015-10-24 ENCOUNTER — Encounter (HOSPITAL_COMMUNITY): Payer: Self-pay

## 2015-10-24 DIAGNOSIS — R350 Frequency of micturition: Secondary | ICD-10-CM | POA: Diagnosis not present

## 2015-10-24 DIAGNOSIS — N3289 Other specified disorders of bladder: Secondary | ICD-10-CM | POA: Diagnosis not present

## 2015-10-24 DIAGNOSIS — I159 Secondary hypertension, unspecified: Secondary | ICD-10-CM | POA: Diagnosis not present

## 2015-10-24 DIAGNOSIS — I4891 Unspecified atrial fibrillation: Secondary | ICD-10-CM | POA: Diagnosis not present

## 2015-10-24 DIAGNOSIS — N400 Enlarged prostate without lower urinary tract symptoms: Secondary | ICD-10-CM | POA: Diagnosis not present

## 2015-10-24 DIAGNOSIS — N401 Enlarged prostate with lower urinary tract symptoms: Secondary | ICD-10-CM | POA: Diagnosis not present

## 2015-10-24 DIAGNOSIS — R339 Retention of urine, unspecified: Secondary | ICD-10-CM | POA: Diagnosis not present

## 2015-10-24 DIAGNOSIS — N529 Male erectile dysfunction, unspecified: Secondary | ICD-10-CM | POA: Diagnosis not present

## 2015-10-25 DIAGNOSIS — N401 Enlarged prostate with lower urinary tract symptoms: Secondary | ICD-10-CM | POA: Diagnosis not present

## 2015-10-25 DIAGNOSIS — I159 Secondary hypertension, unspecified: Secondary | ICD-10-CM | POA: Diagnosis not present

## 2015-10-25 DIAGNOSIS — N529 Male erectile dysfunction, unspecified: Secondary | ICD-10-CM | POA: Diagnosis not present

## 2015-10-25 DIAGNOSIS — N3289 Other specified disorders of bladder: Secondary | ICD-10-CM | POA: Diagnosis not present

## 2015-10-25 DIAGNOSIS — I4891 Unspecified atrial fibrillation: Secondary | ICD-10-CM | POA: Diagnosis not present

## 2015-10-25 DIAGNOSIS — R350 Frequency of micturition: Secondary | ICD-10-CM | POA: Diagnosis not present

## 2015-10-26 ENCOUNTER — Encounter (HOSPITAL_COMMUNITY): Payer: Self-pay

## 2015-10-27 ENCOUNTER — Encounter (HOSPITAL_COMMUNITY): Payer: Self-pay

## 2015-10-27 DIAGNOSIS — N401 Enlarged prostate with lower urinary tract symptoms: Secondary | ICD-10-CM | POA: Diagnosis not present

## 2015-10-31 ENCOUNTER — Encounter (HOSPITAL_COMMUNITY): Payer: Self-pay

## 2015-11-02 ENCOUNTER — Encounter (HOSPITAL_COMMUNITY): Payer: Self-pay

## 2015-11-02 DIAGNOSIS — I2510 Atherosclerotic heart disease of native coronary artery without angina pectoris: Secondary | ICD-10-CM | POA: Insufficient documentation

## 2015-11-03 ENCOUNTER — Encounter (HOSPITAL_COMMUNITY): Payer: Self-pay

## 2015-11-07 ENCOUNTER — Encounter (HOSPITAL_COMMUNITY): Payer: Self-pay

## 2015-11-09 ENCOUNTER — Encounter (HOSPITAL_COMMUNITY): Payer: Self-pay

## 2015-11-10 ENCOUNTER — Encounter (HOSPITAL_COMMUNITY): Payer: Self-pay

## 2015-11-10 ENCOUNTER — Telehealth: Payer: Self-pay | Admitting: Cardiology

## 2015-11-10 NOTE — Telephone Encounter (Signed)
New message      Talk to the nurse regarding prior auth on eliquis

## 2015-11-13 ENCOUNTER — Telehealth: Payer: Self-pay

## 2015-11-13 MED ORDER — APIXABAN 5 MG PO TABS: 5.0000 mg | ORAL_TABLET | Freq: Two times a day (BID) | ORAL | Status: DC

## 2015-11-13 NOTE — Telephone Encounter (Signed)
refill sent 

## 2015-11-13 NOTE — Telephone Encounter (Signed)
Error

## 2015-11-13 NOTE — Telephone Encounter (Signed)
°*  STAT* If patient is at the pharmacy, call can be transferred to refill team.   1. Which medications need to be refilled? (please list name of each medication and dose if known) Eliquis 5 mg BID  2. Which pharmacy/location (including street and city if local pharmacy) is medication to be sent to?[facility]  3. Do they need a 30 day or 90 day supply? [place]

## 2015-11-14 ENCOUNTER — Encounter (HOSPITAL_COMMUNITY): Payer: Self-pay

## 2015-11-16 ENCOUNTER — Encounter (HOSPITAL_COMMUNITY)
Admission: RE | Admit: 2015-11-16 | Discharge: 2015-11-16 | Disposition: A | Discharge status: Home / Self Care | Payer: Self-pay | Source: Ambulatory Visit | Attending: Cardiology | Admitting: Cardiology

## 2015-11-16 DIAGNOSIS — Z961 Presence of intraocular lens: Secondary | ICD-10-CM | POA: Diagnosis not present

## 2015-11-16 DIAGNOSIS — E119 Type 2 diabetes mellitus without complications: Secondary | ICD-10-CM | POA: Diagnosis not present

## 2015-11-16 LAB — HM DIABETES EYE EXAM

## 2015-11-17 ENCOUNTER — Encounter: Payer: Self-pay | Admitting: *Deleted

## 2015-11-17 ENCOUNTER — Emergency Department (INDEPENDENT_AMBULATORY_CARE_PROVIDER_SITE_OTHER)
Admission: EM | Admit: 2015-11-17 | Discharge: 2015-11-17 | Disposition: A | Discharge status: Home / Self Care | Payer: Medicare Other | Source: Home / Self Care | Attending: Family Medicine | Admitting: Family Medicine

## 2015-11-17 ENCOUNTER — Encounter (HOSPITAL_COMMUNITY): Payer: Self-pay

## 2015-11-17 DIAGNOSIS — J069 Acute upper respiratory infection, unspecified: Secondary | ICD-10-CM

## 2015-11-17 DIAGNOSIS — J029 Acute pharyngitis, unspecified: Secondary | ICD-10-CM

## 2015-11-17 LAB — POCT RAPID STREP A (OFFICE): Rapid Strep A Screen: NEGATIVE

## 2015-11-17 MED ORDER — BENZONATATE 200 MG PO CAPS
200.0000 mg | ORAL_CAPSULE | Freq: Every day | ORAL | Status: DC
Start: 2015-11-17 — End: 2015-11-23

## 2015-11-17 NOTE — ED Provider Notes (Signed)
CSN: [id number]     Arrival date & time 11/17/15  [id number] History   First MD Initiated Contact with Patient 11/17/15 1030     Chief Complaint  Patient presents with  . Sore Throat      HPI Comments:  Last night patient developed a sore throat, hoarseness, mild nasal congestion, mild fatigue, and chills.  No cough.   The history is provided by the patient.    Past Medical History  Diagnosis Date  . CAD (coronary artery disease)     Dr Peter Martinique  . HTN (hypertension)   . HLD (hyperlipidemia)   . DM (diabetes mellitus) ([place])   . ED (erectile dysfunction)   . Carotid stenosis   . Obesity   . OSA (obstructive sleep apnea)     oral appliance, Dr Ron Parker  . Paroxysmal atrial fibrillation (HCC)   . Complication of anesthesia     " DIFFICULTY WAKING "  . Myocardial infarction [facility]) 2007   Past Surgical History  Procedure Laterality Date  . Appendectomy  1958  . Ankle fracture surgery  1993  . Hand surgery  1965  . Tonsillectomy    . Cardiac catheterization  2007 & 2011  . Colonoscopy  2002    negative; Dr Olevia Perches  . Cardiac catheterization N/A 01/13/2015    Procedure: Left Heart Cath and Coronary Angiography;  Surgeon: Peter M Martinique, MD;  Location: [place] [facility];  Service: Cardiovascular;  Laterality: N/A;   Family History  Problem Relation Age of Onset  . Diabetes Mother   . Colon cancer Paternal Uncle   . Heart attack Father      4 vessel CBAG @ 54  . Diabetes Brother   . Stroke Neg Hx    Social History  Substance Use Topics  . Smoking status: Former Smoker -- 1.00 packs/day    Quit date: 09/02/1990  . Smokeless tobacco: Never Used     Comment: onset age 36-50 up to 1 ppd  . Alcohol Use: No    Review of Systems + sore throat + hoarse No cough No pleuritic pain No wheezing + nasal congestion + post-nasal drainage No sinus pain/pressure No itchy/red eyes No earache No hemoptysis No SOB No fever, + chills No nausea No vomiting No abdominal pain No  diarrhea No urinary symptoms No skin rash + fatigue No myalgias No headache Used OTC meds without relief  Allergies  Acetaminophen; Oxycodone-acetaminophen; and Flomax  Home Medications   Prior to Admission medications   Medication Sig Start Date End Date Taking? Authorizing Provider  amLODipine (NORVASC) 2.5 MG tablet Take 1 tablet (2.5 mg total) by mouth daily. 02/06/15   Peter M Martinique, MD  apixaban (ELIQUIS) 5 MG TABS tablet Take 1 tablet (5 mg total) by mouth 2 (two) times daily. 11/13/15   Peter M Martinique, MD  benzonatate (TESSALON) 200 MG capsule Take 1 capsule (200 mg total) by mouth at bedtime. Take as needed for cough 11/17/15   Kandra Nicolas, MD  carvedilol (COREG) 12.5 MG tablet TAKE 1 AND 1/2 TABLETS TWICE DAILY WITH MEALS 05/03/15   Peter M Martinique, MD  fish oil-omega-3 fatty acids 1000 MG capsule Take 2 g by mouth daily.      Historical Provider, MD  losartan (COZAAR) 100 MG tablet TAKE 1 TABLET EVERY DAY 05/03/15   Peter M Martinique, MD  metFORMIN (GLUCOPHAGE) 500 MG tablet TAKE ONE TABLET BY MOUTH EVERY MORNING 07/20/15   Hendricks Limes, MD  nitroGLYCERIN (  NITROSTAT) 0.4 MG SL tablet Place 1 tablet (0.4 mg total) under the tongue every 5 (five) minutes as needed. 12/30/14   Peter M Martinique, MD  Saw Palmetto, Serenoa repens, (SAW PALMETTO PO) Take 1 tablet by mouth daily.      Historical Provider, MD  simvastatin (ZOCOR) 40 MG tablet TAKE 1 TABLET BY MOUTH AT BEDTIME 09/05/15   Peter M Martinique, MD   Meds Ordered and Administered this Visit  Medications - No data to display  BP 145/72 mmHg  Pulse 73  Temp(Src) 97.7 F (36.5 C) (Oral)  Resp 14  Wt 196 lb (88.905 kg)  SpO2 96% No data found.   Physical Exam Nursing notes and Vital Signs reviewed. Appearance:  Patient appears stated age, and in no acute distress Eyes:  Pupils are equal, round, and reactive to light and accomodation.  Extraocular movement is intact.  Conjunctivae are not inflamed  Ears:  Canals normal.   Tympanic membranes normal.  Nose:  Mildly congested turbinates.  No sinus tenderness.   Pharynx:  Minimal erythema Neck:  Supple.  Non-tender enlarged posterior nodes are palpated bilaterally  Lungs:  Clear to auscultation.  Breath sounds are equal.  Moving air well. Heart:  Regular rate and rhythm without murmurs, rubs, or gallops.  Abdomen:  Nontender without masses or hepatosplenomegaly.  Bowel sounds are present.  No CVA or flank tenderness.  Extremities:  No edema.  Skin:  No rash present.   ED Course  Procedures none      Labs Reviewed  POCT RAPID STREP A (OFFICE) negative      MDM   1. Acute pharyngitis, unspecified etiology   2. Viral URI    There is no evidence of bacterial infection today.  Treat symptomatically for now  Prescription written for Benzonatate (Tessalon) to take at bedtime for night-time cough.  Take plain guaifenesin (1200mg  extended release tabs such as Mucinex) twice daily, with plenty of water, for cough and congestion.   Get adequate rest.   May use Afrin nasal spray (or generic oxymetazoline) twice daily for about 5 days and then discontinue.  Also recommend using saline nasal spray several times daily and saline nasal irrigation (AYR is a common brand).  Use Flonase nasal spray each morning after using Afrin nasal spray and saline nasal irrigation. Try warm salt water gargles for sore throat.  Stop all antihistamines for now, and other non-prescription cough/cold preparations. Follow-up with family doctor if not improving about 7 to 10 days.    Kandra Nicolas, MD 11/21/15 1242

## 2015-11-17 NOTE — Discharge Instructions (Signed)
Take plain guaifenesin (1200mg  extended release tabs such as Mucinex) twice daily, with plenty of water, for cough and congestion.   Get adequate rest.   May use Afrin nasal spray (or generic oxymetazoline) twice daily for about 5 days and then discontinue.  Also recommend using saline nasal spray several times daily and saline nasal irrigation (AYR is a common brand).  Use Flonase nasal spray each morning after using Afrin nasal spray and saline nasal irrigation. Try warm salt water gargles for sore throat.  Stop all antihistamines for now, and other non-prescription cough/cold preparations. Follow-up with family doctor if not improving about 7 to 10 days.

## 2015-11-17 NOTE — ED Notes (Signed)
Pt c/o severe sore throat last night and sinus drainage this AM. His throat is much better this morning. Afebrile. His wife is having surgery next week for breast CA so he wants to ensure he is not contagious.

## 2015-11-21 ENCOUNTER — Encounter (HOSPITAL_COMMUNITY)
Admission: RE | Admit: 2015-11-21 | Discharge: 2015-11-21 | Disposition: A | Discharge status: Home / Self Care | Payer: Self-pay | Source: Ambulatory Visit | Attending: Cardiology | Admitting: Cardiology

## 2015-11-23 ENCOUNTER — Ambulatory Visit (INDEPENDENT_AMBULATORY_CARE_PROVIDER_SITE_OTHER): Payer: Medicare Other | Admitting: Family Medicine

## 2015-11-23 ENCOUNTER — Encounter (HOSPITAL_COMMUNITY): Payer: Self-pay

## 2015-11-23 ENCOUNTER — Encounter: Payer: Self-pay | Admitting: Family Medicine

## 2015-11-23 VITALS — BP 128/74 | HR 63 | Temp 98.2°F | Resp 16 | Ht 68.0 in | Wt 198.0 lb

## 2015-11-23 DIAGNOSIS — J209 Acute bronchitis, unspecified: Secondary | ICD-10-CM | POA: Diagnosis not present

## 2015-11-23 DIAGNOSIS — J069 Acute upper respiratory infection, unspecified: Secondary | ICD-10-CM

## 2015-11-23 MED ORDER — PREDNISONE 20 MG PO TABS: ORAL_TABLET | ORAL | Status: DC

## 2015-11-23 MED ORDER — AZITHROMYCIN 250 MG PO TABS: ORAL_TABLET | ORAL | Status: DC

## 2015-11-23 NOTE — Patient Instructions (Signed)
Get otc generic robitussin DM OR Mucinex DM and use as directed on the packaging for cough and congestion. Use otc generic saline nasal spray 2-3 times per day to irrigate/moisturize your nasal passages.   

## 2015-11-23 NOTE — Progress Notes (Signed)
Pre visit review using our clinic review tool, if applicable. No additional management support is needed unless otherwise documented below in the visit note. 

## 2015-11-23 NOTE — Progress Notes (Signed)
OFFICE VISIT  11/23/2015   CC:  Chief Complaint  Patient presents with  . Cough    seen at ER on 11/17/15 for Sore Throat   HPI:    Patient is a 75 y.o. Caucasian male who presents for cough. Illness started about 7 d/a with ST, hoarseness, mild nasal congestion, mild fatigue, and chills.  Then next day he presented to UC where rapid strep was neg and he was dx'd with viral URI/pharyngitis and symptomatic care was rx'd (guaifenacin and tessalon). He felt like he got a little better for a couple days, then the last couple days he feels worse.  Cough is deeper/"wetter", lots of hacking when he lies supine.  No SOB.  The cough is nonproductive.  Neti pot use gets some mucous to come out.   Tessalon perles keep him awake at night.  Takes mucinex--not helping much.  No fevers. Former smoker: quit >25 yrs ago.  Past Medical History  Diagnosis Date  . CAD (coronary artery disease)     Dr Peter Martinique  . HTN (hypertension)   . HLD (hyperlipidemia)   . DM (diabetes mellitus) ([place])   . ED (erectile dysfunction)   . Carotid stenosis   . Obesity   . OSA (obstructive sleep apnea)     oral appliance, Dr Ron Parker  . Paroxysmal atrial fibrillation (HCC)   . Complication of anesthesia     " DIFFICULTY WAKING "  . Myocardial infarction [facility]) 2007    Past Surgical History  Procedure Laterality Date  . Appendectomy  1958  . Ankle fracture surgery  1993  . Hand surgery  1965  . Tonsillectomy    . Cardiac catheterization  2007 & 2011  . Colonoscopy  2002    negative; Dr Olevia Perches  . Cardiac catheterization N/A 01/13/2015    Procedure: Left Heart Cath and Coronary Angiography;  Surgeon: Peter M Martinique, MD;  Location: [place] [facility];  Service: Cardiovascular;  Laterality: N/A;    Outpatient Prescriptions Prior to Visit  Medication Sig Dispense Refill  . amLODipine (NORVASC) 2.5 MG tablet Take 1 tablet (2.5 mg total) by mouth daily. 90 tablet 3  . apixaban (ELIQUIS) 5 MG TABS tablet Take 1  tablet (5 mg total) by mouth 2 (two) times daily. 60 tablet 5  . carvedilol (COREG) 12.5 MG tablet TAKE 1 AND 1/2 TABLETS TWICE DAILY WITH MEALS 270 tablet 2  . fish oil-omega-3 fatty acids 1000 MG capsule Take 2 g by mouth daily.      Marland Kitchen losartan (COZAAR) 100 MG tablet TAKE 1 TABLET EVERY DAY 90 tablet 2  . metFORMIN (GLUCOPHAGE) 500 MG tablet TAKE ONE TABLET BY MOUTH EVERY MORNING 90 tablet 1  . nitroGLYCERIN (NITROSTAT) 0.4 MG SL tablet Place 1 tablet (0.4 mg total) under the tongue every 5 (five) minutes as needed. 25 tablet 11  . Saw Palmetto, Serenoa repens, (SAW PALMETTO PO) Take 1 tablet by mouth daily.      . simvastatin (ZOCOR) 40 MG tablet TAKE 1 TABLET BY MOUTH AT BEDTIME 90 tablet 1  . benzonatate (TESSALON) 200 MG capsule Take 1 capsule (200 mg total) by mouth at bedtime. Take as needed for cough (Patient not taking: Reported on 11/23/2015) 12 capsule 0   No facility-administered medications prior to visit.    Allergies  Allergen Reactions  . Acetaminophen     Drug induced hepatitis  . Oxycodone-Acetaminophen     Drug induced hepatitis  . Flomax [Tamsulosin Hcl]  incontinence    ROS As per HPI  PE: Blood pressure 128/74, pulse 63, temperature 98.2 F (36.8 C), temperature source Oral, resp. rate 16, height 5\' 8"  (1.727 m), weight 198 lb (89.812 kg), SpO2 95 %. VS: noted--normal. Gen: alert, NAD, NONTOXIC APPEARING. HEENT: eyes without injection, drainage, or swelling.  Ears: EACs clear, TMs with normal light reflex and landmarks.  Nose: Clear rhinorrhea, with some dried, crusty exudate adherent to mildly injected mucosa.  No purulent d/c.  No paranasal sinus TTP.  No facial swelling.  Throat and mouth without focal lesion.  No pharyngial swelling, erythema, or exudate.   Neck: supple, no LAD.   LUNGS: CTA bilat on insp, with scattered coarse exp wheeze on expiration, with mildly prolonged exp phase and post-exhalation coughing. Nonlabored resps.   CV: RRR, no  m/r/g. EXT: no c/c/e SKIN: no rash  LABS:  none  IMPRESSION AND PLAN: URI, with acute bronchitis, possibly bacterial. Z-pack. Prednisone 40mg  qd x 5d, then 20mg  qd x 5d. Get otc generic robitussin DM OR Mucinex DM and use as directed on the packaging for cough and congestion. Use otc generic saline nasal spray 2-3 times per day to irrigate/moisturize your nasal passages.  An After Visit Summary was printed and given to the patient.  FOLLOW UP: Return if symptoms worsen or fail to improve.

## 2015-11-24 ENCOUNTER — Encounter (HOSPITAL_COMMUNITY): Payer: Self-pay

## 2015-11-28 ENCOUNTER — Encounter (HOSPITAL_COMMUNITY)
Admission: RE | Admit: 2015-11-28 | Discharge: 2015-11-28 | Disposition: A | Discharge status: Home / Self Care | Payer: Self-pay | Source: Ambulatory Visit | Attending: Cardiology | Admitting: Cardiology

## 2015-11-30 ENCOUNTER — Encounter (HOSPITAL_COMMUNITY)
Admission: RE | Admit: 2015-11-30 | Discharge: 2015-11-30 | Disposition: A | Discharge status: Home / Self Care | Payer: Self-pay | Source: Ambulatory Visit | Attending: Cardiology | Admitting: Cardiology

## 2015-12-01 ENCOUNTER — Encounter (HOSPITAL_COMMUNITY)
Admission: RE | Admit: 2015-12-01 | Discharge: 2015-12-01 | Disposition: A | Discharge status: Home / Self Care | Payer: Self-pay | Source: Ambulatory Visit | Attending: Cardiology | Admitting: Cardiology

## 2015-12-05 ENCOUNTER — Encounter (HOSPITAL_COMMUNITY)
Admission: RE | Admit: 2015-12-05 | Discharge: 2015-12-05 | Disposition: A | Discharge status: Home / Self Care | Payer: Self-pay | Source: Ambulatory Visit | Attending: Cardiology | Admitting: Cardiology

## 2015-12-05 DIAGNOSIS — I2510 Atherosclerotic heart disease of native coronary artery without angina pectoris: Secondary | ICD-10-CM | POA: Insufficient documentation

## 2015-12-07 ENCOUNTER — Encounter: Payer: Self-pay | Admitting: Geriatric Medicine

## 2015-12-07 ENCOUNTER — Encounter (HOSPITAL_COMMUNITY)
Admission: RE | Admit: 2015-12-07 | Discharge: 2015-12-07 | Disposition: A | Discharge status: Home / Self Care | Payer: Self-pay | Source: Ambulatory Visit | Attending: Cardiology | Admitting: Cardiology

## 2015-12-08 ENCOUNTER — Encounter (HOSPITAL_COMMUNITY)
Admission: RE | Admit: 2015-12-08 | Discharge: 2015-12-08 | Disposition: A | Discharge status: Home / Self Care | Payer: Self-pay | Source: Ambulatory Visit | Attending: Cardiology | Admitting: Cardiology

## 2015-12-12 ENCOUNTER — Encounter (HOSPITAL_COMMUNITY)
Admission: RE | Admit: 2015-12-12 | Discharge: 2015-12-12 | Disposition: A | Discharge status: Home / Self Care | Payer: Self-pay | Source: Ambulatory Visit | Attending: Cardiology | Admitting: Cardiology

## 2015-12-14 ENCOUNTER — Encounter (HOSPITAL_COMMUNITY)
Admission: RE | Admit: 2015-12-14 | Discharge: 2015-12-14 | Disposition: A | Discharge status: Home / Self Care | Payer: Self-pay | Source: Ambulatory Visit | Attending: Cardiology | Admitting: Cardiology

## 2015-12-15 ENCOUNTER — Encounter (HOSPITAL_COMMUNITY): Payer: Self-pay

## 2015-12-19 ENCOUNTER — Encounter (HOSPITAL_COMMUNITY)
Admission: RE | Admit: 2015-12-19 | Discharge: 2015-12-19 | Disposition: A | Discharge status: Home / Self Care | Payer: Self-pay | Source: Ambulatory Visit | Attending: Cardiology | Admitting: Cardiology

## 2015-12-21 ENCOUNTER — Other Ambulatory Visit: Payer: Self-pay | Admitting: Cardiology

## 2015-12-21 ENCOUNTER — Encounter (HOSPITAL_COMMUNITY)
Admission: RE | Admit: 2015-12-21 | Discharge: 2015-12-21 | Disposition: A | Discharge status: Home / Self Care | Payer: Self-pay | Source: Ambulatory Visit | Attending: Cardiology | Admitting: Cardiology

## 2015-12-21 NOTE — Telephone Encounter (Signed)
Rx refill sent to pharmacy. 

## 2015-12-22 ENCOUNTER — Encounter (HOSPITAL_COMMUNITY)
Admission: RE | Admit: 2015-12-22 | Discharge: 2015-12-22 | Disposition: A | Discharge status: Home / Self Care | Payer: Self-pay | Source: Ambulatory Visit | Attending: Cardiology | Admitting: Cardiology

## 2015-12-26 ENCOUNTER — Encounter (HOSPITAL_COMMUNITY)
Admission: RE | Admit: 2015-12-26 | Discharge: 2015-12-26 | Disposition: A | Discharge status: Home / Self Care | Payer: Self-pay | Source: Ambulatory Visit | Attending: Cardiology | Admitting: Cardiology

## 2015-12-28 ENCOUNTER — Encounter (HOSPITAL_COMMUNITY)
Admission: RE | Admit: 2015-12-28 | Discharge: 2015-12-28 | Disposition: A | Discharge status: Home / Self Care | Payer: Self-pay | Source: Ambulatory Visit | Attending: Cardiology | Admitting: Cardiology

## 2015-12-29 ENCOUNTER — Encounter (HOSPITAL_COMMUNITY)
Admission: RE | Admit: 2015-12-29 | Discharge: 2015-12-29 | Disposition: A | Discharge status: Home / Self Care | Payer: Self-pay | Source: Ambulatory Visit | Attending: Cardiology | Admitting: Cardiology

## 2016-01-02 ENCOUNTER — Encounter (HOSPITAL_COMMUNITY)
Admission: RE | Admit: 2016-01-02 | Discharge: 2016-01-02 | Disposition: A | Discharge status: Home / Self Care | Payer: Self-pay | Source: Ambulatory Visit | Attending: Cardiology | Admitting: Cardiology

## 2016-01-02 DIAGNOSIS — I2510 Atherosclerotic heart disease of native coronary artery without angina pectoris: Secondary | ICD-10-CM | POA: Insufficient documentation

## 2016-01-04 ENCOUNTER — Encounter (HOSPITAL_COMMUNITY)
Admission: RE | Admit: 2016-01-04 | Discharge: 2016-01-04 | Disposition: A | Discharge status: Home / Self Care | Payer: Self-pay | Source: Ambulatory Visit | Attending: Cardiology | Admitting: Cardiology

## 2016-01-05 ENCOUNTER — Encounter (HOSPITAL_COMMUNITY)
Admission: RE | Admit: 2016-01-05 | Discharge: 2016-01-05 | Disposition: A | Discharge status: Home / Self Care | Payer: Self-pay | Source: Ambulatory Visit | Attending: Cardiology | Admitting: Cardiology

## 2016-01-09 ENCOUNTER — Encounter (HOSPITAL_COMMUNITY)
Admission: RE | Admit: 2016-01-09 | Discharge: 2016-01-09 | Disposition: A | Discharge status: Home / Self Care | Payer: Self-pay | Source: Ambulatory Visit | Attending: Cardiology | Admitting: Cardiology

## 2016-01-11 ENCOUNTER — Encounter (HOSPITAL_COMMUNITY)
Admission: RE | Admit: 2016-01-11 | Discharge: 2016-01-11 | Disposition: A | Discharge status: Home / Self Care | Payer: Self-pay | Source: Ambulatory Visit | Attending: Cardiology | Admitting: Cardiology

## 2016-01-12 ENCOUNTER — Encounter (HOSPITAL_COMMUNITY)
Admission: RE | Admit: 2016-01-12 | Discharge: 2016-01-12 | Disposition: A | Discharge status: Home / Self Care | Payer: Self-pay | Source: Ambulatory Visit | Attending: Cardiology | Admitting: Cardiology

## 2016-01-16 ENCOUNTER — Encounter (HOSPITAL_COMMUNITY)
Admission: RE | Admit: 2016-01-16 | Discharge: 2016-01-16 | Disposition: A | Discharge status: Home / Self Care | Payer: Self-pay | Source: Ambulatory Visit | Attending: Cardiology | Admitting: Cardiology

## 2016-01-18 ENCOUNTER — Encounter (HOSPITAL_COMMUNITY): Payer: Self-pay

## 2016-01-19 ENCOUNTER — Encounter (HOSPITAL_COMMUNITY)
Admission: RE | Admit: 2016-01-19 | Discharge: 2016-01-19 | Disposition: A | Discharge status: Home / Self Care | Payer: Self-pay | Source: Ambulatory Visit | Attending: Cardiology | Admitting: Cardiology

## 2016-01-23 ENCOUNTER — Encounter (HOSPITAL_COMMUNITY)
Admission: RE | Admit: 2016-01-23 | Discharge: 2016-01-23 | Disposition: A | Discharge status: Home / Self Care | Payer: Self-pay | Source: Ambulatory Visit | Attending: Cardiology | Admitting: Cardiology

## 2016-01-24 ENCOUNTER — Ambulatory Visit: Payer: Medicare Other | Admitting: Family Medicine

## 2016-01-25 ENCOUNTER — Encounter (HOSPITAL_COMMUNITY)
Admission: RE | Admit: 2016-01-25 | Discharge: 2016-01-25 | Disposition: A | Discharge status: Home / Self Care | Payer: Self-pay | Source: Ambulatory Visit | Attending: Cardiology | Admitting: Cardiology

## 2016-01-26 ENCOUNTER — Encounter (HOSPITAL_COMMUNITY)
Admission: RE | Admit: 2016-01-26 | Discharge: 2016-01-26 | Disposition: A | Discharge status: Home / Self Care | Payer: Self-pay | Source: Ambulatory Visit | Attending: Cardiology | Admitting: Cardiology

## 2016-01-30 ENCOUNTER — Encounter (HOSPITAL_COMMUNITY)
Admission: RE | Admit: 2016-01-30 | Discharge: 2016-01-30 | Disposition: A | Discharge status: Home / Self Care | Payer: Self-pay | Source: Ambulatory Visit | Attending: Cardiology | Admitting: Cardiology

## 2016-01-31 ENCOUNTER — Telehealth: Payer: Self-pay | Admitting: Family Medicine

## 2016-01-31 MED ORDER — METFORMIN HCL 500 MG PO TABS: 500.0000 mg | ORAL_TABLET | Freq: Every morning | ORAL | Status: DC

## 2016-01-31 NOTE — Telephone Encounter (Signed)
Medication filled to pharmacy as requested.   

## 2016-01-31 NOTE — Telephone Encounter (Signed)
Last OV with [facility] was 11/21/14 (DM), pt last had A1c drawn in august. Please advise?

## 2016-01-31 NOTE — Telephone Encounter (Signed)
Ok for meformin x3 months until his appt w/ me

## 2016-01-31 NOTE — Telephone Encounter (Signed)
Pt states that he needs a refill on metformin, Pt has not had his NP appt w/Dr Birdie Riddle. This is scheduled for august. Pt asking if this can be done or is an appt needed, [facility] [address]

## 2016-02-01 ENCOUNTER — Encounter (HOSPITAL_COMMUNITY)
Admission: RE | Admit: 2016-02-01 | Discharge: 2016-02-01 | Disposition: A | Discharge status: Home / Self Care | Payer: Self-pay | Source: Ambulatory Visit | Attending: Cardiology | Admitting: Cardiology

## 2016-02-01 DIAGNOSIS — I2510 Atherosclerotic heart disease of native coronary artery without angina pectoris: Secondary | ICD-10-CM | POA: Insufficient documentation

## 2016-02-02 ENCOUNTER — Encounter: Payer: Self-pay | Admitting: Family Medicine

## 2016-02-02 ENCOUNTER — Encounter (HOSPITAL_COMMUNITY)
Admission: RE | Admit: 2016-02-02 | Discharge: 2016-02-02 | Disposition: A | Discharge status: Home / Self Care | Payer: Self-pay | Source: Ambulatory Visit | Attending: Cardiology | Admitting: Cardiology

## 2016-02-02 ENCOUNTER — Ambulatory Visit (INDEPENDENT_AMBULATORY_CARE_PROVIDER_SITE_OTHER): Payer: Medicare Other | Admitting: Family Medicine

## 2016-02-02 VITALS — BP 132/64 | HR 82 | Temp 98.1°F | Resp 16 | Ht 68.0 in | Wt 197.4 lb

## 2016-02-02 DIAGNOSIS — E1159 Type 2 diabetes mellitus with other circulatory complications: Secondary | ICD-10-CM

## 2016-02-02 DIAGNOSIS — N401 Enlarged prostate with lower urinary tract symptoms: Secondary | ICD-10-CM | POA: Diagnosis not present

## 2016-02-02 DIAGNOSIS — E785 Hyperlipidemia, unspecified: Secondary | ICD-10-CM | POA: Diagnosis not present

## 2016-02-02 DIAGNOSIS — I10 Essential (primary) hypertension: Secondary | ICD-10-CM | POA: Diagnosis not present

## 2016-02-02 MED ORDER — EPINEPHRINE 0.3 MG/0.3ML IJ SOAJ: 0.3000 mg | Freq: Once | INTRAMUSCULAR | Status: AC

## 2016-02-02 NOTE — Assessment & Plan Note (Signed)
Chronic problem.  BP is well controlled and pt is asymptomatic.  Check labs.  No anticipated med changes.  Will follow.

## 2016-02-02 NOTE — Patient Instructions (Signed)
Schedule your physical in 6 months Go to [facility] and have your labs done next week- please arrive fasting Keep up the good work!  You look great! Continue to focus on healthy diet and regular exercise Call with any questions or concerns Welcome!  We're glad to have you!!! Have a great weekend!!!

## 2016-02-02 NOTE — Progress Notes (Signed)
   Subjective:    Patient ID: Jacob Curry, male    DOB: 01-03-1941, 75 y.o.   MRN: [id number]  HPI New to establish.  Previous MD- Hopp  DM- chronic problem, on Metformin.  UTD on eye exam.  On ARB for renal protection.  No symptomatic lows.  No numbness/tingling of hands/feet.  No sores or skin breakdown on feet.  HTN- chronic problem, on Losartan, Amlodipine, Carvedilol w/ good control.  No CP, SOB, HAs, visual changes, edema.  Hyperlipidemia- chronic problem, on Simvastatin, fish oil.  No abd pain, N/V   Review of Systems For ROS see HPI     Objective:   Physical Exam  Constitutional: He is oriented to person, place, and time. He appears well-developed and well-nourished. No distress.  HENT:  Head: Normocephalic and atraumatic.  Eyes: Conjunctivae and EOM are normal. Pupils are equal, round, and reactive to light.  Neck: Normal range of motion. Neck supple. No thyromegaly present.  Cardiovascular: Normal rate, regular rhythm, normal heart sounds and intact distal pulses.   No murmur heard. Pulmonary/Chest: Effort normal and breath sounds normal. No respiratory distress.  Abdominal: Soft. Bowel sounds are normal. He exhibits no distension.  Musculoskeletal: He exhibits no edema.  Lymphadenopathy:    He has no cervical adenopathy.  Neurological: He is alert and oriented to person, place, and time. No cranial nerve deficit.  Skin: Skin is warm and dry.  Psychiatric: He has a normal mood and affect. His behavior is normal.  Vitals reviewed.         Assessment & Plan:

## 2016-02-02 NOTE — Assessment & Plan Note (Signed)
Chronic problem.  Tolerating metformin w/o difficulty.  UTD on eye exam, foot exam done today.  On ARB for renal protection.  Check labs.  Adjust meds prn.

## 2016-02-02 NOTE — Progress Notes (Signed)
Pre visit review using our clinic review tool, if applicable. No additional management support is needed unless otherwise documented below in the visit note. 

## 2016-02-02 NOTE — Assessment & Plan Note (Signed)
Chronic problem.  Tolerating statin w/o difficulty.  Discussed need for healthy diet and regular exercise.  Check labs.  Adjust meds prn  

## 2016-02-05 ENCOUNTER — Other Ambulatory Visit (INDEPENDENT_AMBULATORY_CARE_PROVIDER_SITE_OTHER): Payer: Medicare Other

## 2016-02-05 DIAGNOSIS — E785 Hyperlipidemia, unspecified: Secondary | ICD-10-CM

## 2016-02-05 DIAGNOSIS — I10 Essential (primary) hypertension: Secondary | ICD-10-CM | POA: Diagnosis not present

## 2016-02-05 DIAGNOSIS — E1159 Type 2 diabetes mellitus with other circulatory complications: Secondary | ICD-10-CM | POA: Diagnosis not present

## 2016-02-05 LAB — BASIC METABOLIC PANEL
BUN: 17 mg/dL (ref 6–23)
CO2: 27 mEq/L (ref 19–32)
Calcium: 9.1 mg/dL (ref 8.4–10.5)
Chloride: 108 mEq/L (ref 96–112)
Creatinine, Ser: 0.89 mg/dL (ref 0.40–1.50)
GFR: 88.55 mL/min (ref >60.00)
Glucose, Bld: 131 mg/dL — ABNORMAL HIGH (ref 70–99)
Potassium: 4.1 mEq/L (ref 3.5–5.1)
Sodium: 141 mEq/L (ref 135–145)

## 2016-02-05 LAB — HEPATIC FUNCTION PANEL
ALT: 21 U/L (ref 0–53)
AST: 20 U/L (ref 0–37)
Albumin: 4.2 g/dL (ref 3.5–5.2)
Alkaline Phosphatase: 78 U/L (ref 39–117)
Bilirubin, Direct: 0.2 mg/dL (ref 0.0–0.3)
Total Bilirubin: 0.7 mg/dL (ref 0.2–1.2)
Total Protein: 6.9 g/dL (ref 6.0–8.3)

## 2016-02-05 LAB — CBC WITH DIFFERENTIAL/PLATELET
Basophils Absolute: 0.1 10*3/uL (ref 0.0–0.1)
Basophils Relative: 0.6 % (ref 0.0–3.0)
Eosinophils Absolute: 0.2 10*3/uL (ref 0.0–0.7)
Eosinophils Relative: 2.7 % (ref 0.0–5.0)
HCT: 41.2 % (ref 39.0–52.0)
Hemoglobin: 13.8 g/dL (ref 13.0–17.0)
Lymphocytes Relative: 30.1 % (ref 12.0–46.0)
Lymphs Abs: 2.7 10*3/uL (ref 0.7–4.0)
MCHC: 33.4 g/dL (ref 30.0–36.0)
MCV: 86.9 fl (ref 78.0–100.0)
Monocytes Absolute: 0.9 10*3/uL (ref 0.1–1.0)
Monocytes Relative: 9.6 % (ref 3.0–12.0)
Neutro Abs: 5.1 10*3/uL (ref 1.4–7.7)
Neutrophils Relative %: 57 % (ref 43.0–77.0)
Platelets: 200 10*3/uL (ref 150.0–400.0)
RBC: 4.74 Mil/uL (ref 4.22–5.81)
RDW: 14.9 % (ref 11.5–15.5)
WBC: 8.9 10*3/uL (ref 4.0–10.5)

## 2016-02-05 LAB — LIPID PANEL
Cholesterol: 115 mg/dL (ref 0–200)
HDL: 45.5 mg/dL (ref >39.00)
LDL Cholesterol: 43 mg/dL (ref 0–99)
NonHDL: 69.41
Total CHOL/HDL Ratio: 3
Triglycerides: 133 mg/dL (ref 0.0–149.0)
VLDL: 26.6 mg/dL (ref 0.0–40.0)

## 2016-02-05 LAB — HEMOGLOBIN A1C: Hgb A1c MFr Bld: 6.4 % (ref 4.6–6.5)

## 2016-02-05 LAB — TSH: TSH: 3.14 u[IU]/mL (ref 0.35–4.50)

## 2016-02-06 ENCOUNTER — Encounter (HOSPITAL_COMMUNITY)
Admission: RE | Admit: 2016-02-06 | Discharge: 2016-02-06 | Disposition: A | Discharge status: Home / Self Care | Payer: Self-pay | Source: Ambulatory Visit | Attending: Cardiology | Admitting: Cardiology

## 2016-02-08 ENCOUNTER — Encounter (HOSPITAL_COMMUNITY)
Admission: RE | Admit: 2016-02-08 | Discharge: 2016-02-08 | Disposition: A | Discharge status: Home / Self Care | Payer: Self-pay | Source: Ambulatory Visit | Attending: Cardiology | Admitting: Cardiology

## 2016-02-09 ENCOUNTER — Encounter (HOSPITAL_COMMUNITY): Payer: Self-pay

## 2016-02-13 ENCOUNTER — Encounter (HOSPITAL_COMMUNITY)
Admission: RE | Admit: 2016-02-13 | Discharge: 2016-02-13 | Disposition: A | Discharge status: Home / Self Care | Payer: Self-pay | Source: Ambulatory Visit | Attending: Cardiology | Admitting: Cardiology

## 2016-02-15 ENCOUNTER — Encounter (HOSPITAL_COMMUNITY)
Admission: RE | Admit: 2016-02-15 | Discharge: 2016-02-15 | Disposition: A | Discharge status: Home / Self Care | Payer: Self-pay | Source: Ambulatory Visit | Attending: Cardiology | Admitting: Cardiology

## 2016-02-16 ENCOUNTER — Encounter (HOSPITAL_COMMUNITY)
Admission: RE | Admit: 2016-02-16 | Discharge: 2016-02-16 | Disposition: A | Discharge status: Home / Self Care | Payer: Self-pay | Source: Ambulatory Visit | Attending: Cardiology | Admitting: Cardiology

## 2016-02-20 ENCOUNTER — Encounter (HOSPITAL_COMMUNITY): Payer: Self-pay

## 2016-02-22 ENCOUNTER — Encounter (HOSPITAL_COMMUNITY)
Admission: RE | Admit: 2016-02-22 | Discharge: 2016-02-22 | Disposition: A | Discharge status: Home / Self Care | Payer: Self-pay | Source: Ambulatory Visit | Attending: Cardiology | Admitting: Cardiology

## 2016-02-23 ENCOUNTER — Encounter (HOSPITAL_COMMUNITY)
Admission: RE | Admit: 2016-02-23 | Discharge: 2016-02-23 | Disposition: A | Discharge status: Home / Self Care | Payer: Self-pay | Source: Ambulatory Visit | Attending: Cardiology | Admitting: Cardiology

## 2016-02-26 ENCOUNTER — Encounter: Payer: Self-pay | Admitting: Cardiology

## 2016-02-26 ENCOUNTER — Ambulatory Visit (INDEPENDENT_AMBULATORY_CARE_PROVIDER_SITE_OTHER): Payer: Medicare Other | Admitting: Cardiology

## 2016-02-26 VITALS — BP 149/75 | HR 61 | Ht 68.0 in | Wt 195.6 lb

## 2016-02-26 DIAGNOSIS — E1159 Type 2 diabetes mellitus with other circulatory complications: Secondary | ICD-10-CM

## 2016-02-26 DIAGNOSIS — E785 Hyperlipidemia, unspecified: Secondary | ICD-10-CM | POA: Diagnosis not present

## 2016-02-26 DIAGNOSIS — I10 Essential (primary) hypertension: Secondary | ICD-10-CM

## 2016-02-26 DIAGNOSIS — I2510 Atherosclerotic heart disease of native coronary artery without angina pectoris: Secondary | ICD-10-CM | POA: Diagnosis not present

## 2016-02-26 DIAGNOSIS — I480 Paroxysmal atrial fibrillation: Secondary | ICD-10-CM

## 2016-02-26 NOTE — Patient Instructions (Signed)
Continue your current therapy  I will see you in 6 months.   

## 2016-02-26 NOTE — Progress Notes (Signed)
[name] Date of Birth: 05/15/1941 Medical Record [id number]  History of Present Illness: Jacob Curry is seen for follow up CAD. He has a known history of coronary disease with cardiac catheterization in November 2007 showing total occlusion of a large left circumflex vessel with good left to left collaterals. There was 40% disease in the left main and origin of LAD. 90% mid diagonal, 80-90% mid RCA and 90% RV marginal branch.  Ejection fraction was 50-55%. Prior to this he had a nuclear stress test- he was able to exercise 8:30 with severe ischemia of the inferolateral wall. He has been managed medically. Repeat cardiac cath in May 2011 and May 2016 showed no significant change. He has a history of hypertension, hyperlipidemia, Pafib,  diabetes. He has a history of moderate obstructive sleep apnea. This is managed with an oral appliance. He was unable to tolerate CPAP therapy.  He is anticoagulated with Eliquis.   On follow up today he is doing well.  Denies any chest pain, SOB, edema, or palpitations. He is active with cardiac Rehab. He has lost 3 lbs since his last visit. BP at Cardiac Rehab has been normal.   Current Outpatient Prescriptions on File Prior to Visit  Medication Sig Dispense Refill  . amLODipine (NORVASC) 2.5 MG tablet TAKE 1 TABLET (2.5 MG TOTAL) BY MOUTH DAILY. 90 tablet 0  . apixaban (ELIQUIS) 5 MG TABS tablet Take 1 tablet (5 mg total) by mouth 2 (two) times daily. 60 tablet 5  . carvedilol (COREG) 12.5 MG tablet TAKE 1 AND 1/2 TABLETS TWICE DAILY WITH MEALS 270 tablet 0  . EPINEPHrine 0.3 mg/0.3 mL IJ SOAJ injection Inject 0.3 mLs (0.3 mg total) into the muscle once. 1 Device 3  . fish oil-omega-3 fatty acids 1000 MG capsule Take 2 g by mouth daily.      Marland Kitchen losartan (COZAAR) 100 MG tablet TAKE 1 TABLET EVERY DAY 90 tablet 0  . metFORMIN (GLUCOPHAGE) 500 MG tablet Take 1 tablet (500 mg total) by mouth every morning. 90 tablet 0  . nitroGLYCERIN (NITROSTAT) 0.4 MG SL tablet  Place 1 tablet (0.4 mg total) under the tongue every 5 (five) minutes as needed. 25 tablet 11  . Saw Palmetto, Serenoa repens, (SAW PALMETTO PO) Take 1 tablet by mouth daily.      . simvastatin (ZOCOR) 40 MG tablet TAKE 1 TABLET BY MOUTH AT BEDTIME 90 tablet 1   No current facility-administered medications on file prior to visit.    Allergies  Allergen Reactions  . Acetaminophen     Drug induced hepatitis  . Oxycodone-Acetaminophen     Drug induced hepatitis  . Flomax [Tamsulosin Hcl]     incontinence    Past Medical History  Diagnosis Date  . CAD (coronary artery disease)     Dr Peter Martinique  . HTN (hypertension)   . HLD (hyperlipidemia)   . DM (diabetes mellitus) ([place])   . ED (erectile dysfunction)   . Carotid stenosis   . Obesity   . OSA (obstructive sleep apnea)     oral appliance, Dr Ron Parker  . Paroxysmal atrial fibrillation (HCC)   . Complication of anesthesia     " DIFFICULTY WAKING "  . Myocardial infarction [facility]) 2007    Past Surgical History  Procedure Laterality Date  . Appendectomy  1958  . Ankle fracture surgery  1993  . Hand surgery  1965  . Tonsillectomy    . Cardiac catheterization  2007 & 2011  .  Colonoscopy  2002    negative; Dr Olevia Perches  . Cardiac catheterization N/A 01/13/2015    Procedure: Left Heart Cath and Coronary Angiography;  Surgeon: Peter M Martinique, MD;  Location: [place] [facility];  Service: Cardiovascular;  Laterality: N/A;    History  Smoking status  . Former Smoker -- 1.00 packs/day  . Quit date: 09/02/1990  Smokeless tobacco  . Never Used    Comment: onset age 62-50 up to 1 ppd    History  Alcohol Use No    Family History  Problem Relation Age of Onset  . Diabetes Mother   . Colon cancer Paternal Uncle   . Heart attack Father      4 vessel CBAG @ 72  . Diabetes Brother   . Stroke Neg Hx     Review of Systems: As noted in history of present illness.  All other systems were reviewed and are negative.  Physical  Exam: BP 149/75 mmHg  Pulse 61  Ht 5\' 8"  (1.727 m)  Wt 195 lb 9.6 oz (88.724 kg)  BMI 29.75 kg/m2 He is a pleasant white male in no acute distress. HEENT exam is unremarkable. He has no jugular venous distention or bruits. There is no adenopathy or thyromegaly. Cardiac exam reveals a regular rate and rhythm. There is no  gallop, murmur, or click. PMI is normal. Lungs are clear. Abdomen is soft and nontender without masses or bruits. He has normal bowel sounds. Extremities are without cyanosis or edema. Pedal pulses are 2+ and symmetric. He is alert and oriented x3. Cranial nerves II through XII are intact. He has no focal findings.  LABORATORY DATA: Lab Results  Component Value Date   WBC 8.9 02/05/2016   HGB 13.8 02/05/2016   HCT 41.2 02/05/2016   PLT 200.0 02/05/2016   GLUCOSE 131* 02/05/2016   CHOL 115 02/05/2016   TRIG 133.0 02/05/2016   HDL 45.50 02/05/2016   LDLDIRECT 39.9 07/22/2008   LDLCALC 43 02/05/2016   ALT 21 02/05/2016   AST 20 02/05/2016   NA 141 02/05/2016   K 4.1 02/05/2016   CL 108 02/05/2016   CREATININE 0.89 02/05/2016   BUN 17 02/05/2016   CO2 27 02/05/2016   TSH 3.14 02/05/2016   PSA 1.70 01/04/2009   INR 1.75* 01/12/2015   HGBA1C 6.4 02/05/2016   MICROALBUR 0.4 03/09/2012   Ecg today shows NSR with rate 62. Normal Ecg. I have personally reviewed and interpreted this study.  Assessment / Plan: 1. 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 Moderate to severe 3 vessel disease. Patient is asymptomatic on medical therapy. Cardiac cath  in May 2016 unchanged from 2007 and 2011.  Will continue medical therapy and monitor for any new symptoms.  He is on optimal medical therapy.  2. Hypertension, controlled per Cardiac Rehab records.  3. Hyperlipidemia well controlled on medication.  His lipids are excellent.  4. Atrial fibrillation, paroxysmal. Patient is in sinus rhythm. His rate was controlled on beta blocker therapy. He is  asymptomatic. He does have a high Chadvasc score of 4. Continue anticoagulation with Eliquis 5 mg twice a day.   5. DM type 2. On metformin- followed by primary care.   I will follow up in 6 months.

## 2016-02-27 ENCOUNTER — Encounter (HOSPITAL_COMMUNITY)
Admission: RE | Admit: 2016-02-27 | Discharge: 2016-02-27 | Disposition: A | Discharge status: Home / Self Care | Payer: Self-pay | Source: Ambulatory Visit | Attending: Cardiology | Admitting: Cardiology

## 2016-02-29 ENCOUNTER — Encounter (HOSPITAL_COMMUNITY)
Admission: RE | Admit: 2016-02-29 | Discharge: 2016-02-29 | Disposition: A | Discharge status: Home / Self Care | Payer: Self-pay | Source: Ambulatory Visit | Attending: Cardiology | Admitting: Cardiology

## 2016-03-01 ENCOUNTER — Encounter (HOSPITAL_COMMUNITY)
Admission: RE | Admit: 2016-03-01 | Discharge: 2016-03-01 | Disposition: A | Discharge status: Home / Self Care | Payer: Self-pay | Source: Ambulatory Visit | Attending: Cardiology | Admitting: Cardiology

## 2016-03-07 ENCOUNTER — Encounter (HOSPITAL_COMMUNITY): Payer: Self-pay

## 2016-03-07 DIAGNOSIS — I2510 Atherosclerotic heart disease of native coronary artery without angina pectoris: Secondary | ICD-10-CM | POA: Insufficient documentation

## 2016-03-08 ENCOUNTER — Encounter (HOSPITAL_COMMUNITY)
Admission: RE | Admit: 2016-03-08 | Discharge: 2016-03-08 | Disposition: A | Discharge status: Home / Self Care | Payer: Self-pay | Source: Ambulatory Visit | Attending: Cardiology | Admitting: Cardiology

## 2016-03-12 ENCOUNTER — Encounter (HOSPITAL_COMMUNITY)
Admission: RE | Admit: 2016-03-12 | Discharge: 2016-03-12 | Disposition: A | Discharge status: Home / Self Care | Payer: Self-pay | Source: Ambulatory Visit | Attending: Cardiology | Admitting: Cardiology

## 2016-03-14 ENCOUNTER — Encounter (HOSPITAL_COMMUNITY)
Admission: RE | Admit: 2016-03-14 | Discharge: 2016-03-14 | Disposition: A | Discharge status: Home / Self Care | Payer: Self-pay | Source: Ambulatory Visit | Attending: Cardiology | Admitting: Cardiology

## 2016-03-14 DIAGNOSIS — L821 Other seborrheic keratosis: Secondary | ICD-10-CM | POA: Diagnosis not present

## 2016-03-14 DIAGNOSIS — L814 Other melanin hyperpigmentation: Secondary | ICD-10-CM | POA: Diagnosis not present

## 2016-03-14 DIAGNOSIS — L918 Other hypertrophic disorders of the skin: Secondary | ICD-10-CM | POA: Diagnosis not present

## 2016-03-14 DIAGNOSIS — L570 Actinic keratosis: Secondary | ICD-10-CM | POA: Diagnosis not present

## 2016-03-14 DIAGNOSIS — D1801 Hemangioma of skin and subcutaneous tissue: Secondary | ICD-10-CM | POA: Diagnosis not present

## 2016-03-14 DIAGNOSIS — D235 Other benign neoplasm of skin of trunk: Secondary | ICD-10-CM | POA: Diagnosis not present

## 2016-03-15 ENCOUNTER — Encounter (HOSPITAL_COMMUNITY)
Admission: RE | Admit: 2016-03-15 | Discharge: 2016-03-15 | Disposition: A | Discharge status: Home / Self Care | Payer: Self-pay | Source: Ambulatory Visit | Attending: Cardiology | Admitting: Cardiology

## 2016-03-19 ENCOUNTER — Encounter (HOSPITAL_COMMUNITY)
Admission: RE | Admit: 2016-03-19 | Discharge: 2016-03-19 | Disposition: A | Discharge status: Home / Self Care | Payer: Self-pay | Source: Ambulatory Visit | Attending: Cardiology | Admitting: Cardiology

## 2016-03-21 ENCOUNTER — Encounter (HOSPITAL_COMMUNITY)
Admission: RE | Admit: 2016-03-21 | Discharge: 2016-03-21 | Disposition: A | Discharge status: Home / Self Care | Payer: Self-pay | Source: Ambulatory Visit | Attending: Cardiology | Admitting: Cardiology

## 2016-03-22 ENCOUNTER — Encounter (HOSPITAL_COMMUNITY)
Admission: RE | Admit: 2016-03-22 | Discharge: 2016-03-22 | Disposition: A | Discharge status: Home / Self Care | Payer: Self-pay | Source: Ambulatory Visit | Attending: Cardiology | Admitting: Cardiology

## 2016-03-26 ENCOUNTER — Encounter (HOSPITAL_COMMUNITY)
Admission: RE | Admit: 2016-03-26 | Discharge: 2016-03-26 | Disposition: A | Discharge status: Home / Self Care | Payer: Self-pay | Source: Ambulatory Visit | Attending: Cardiology | Admitting: Cardiology

## 2016-03-28 ENCOUNTER — Encounter (HOSPITAL_COMMUNITY)
Admission: RE | Admit: 2016-03-28 | Discharge: 2016-03-28 | Disposition: A | Discharge status: Home / Self Care | Payer: Self-pay | Source: Ambulatory Visit | Attending: Cardiology | Admitting: Cardiology

## 2016-03-29 ENCOUNTER — Encounter (HOSPITAL_COMMUNITY)
Admission: RE | Admit: 2016-03-29 | Discharge: 2016-03-29 | Disposition: A | Discharge status: Home / Self Care | Payer: Self-pay | Source: Ambulatory Visit | Attending: Cardiology | Admitting: Cardiology

## 2016-04-04 ENCOUNTER — Encounter (HOSPITAL_COMMUNITY)
Admission: RE | Admit: 2016-04-04 | Discharge: 2016-04-04 | Disposition: A | Discharge status: Home / Self Care | Payer: Self-pay | Source: Ambulatory Visit | Attending: Cardiology | Admitting: Cardiology

## 2016-04-04 DIAGNOSIS — I2510 Atherosclerotic heart disease of native coronary artery without angina pectoris: Secondary | ICD-10-CM | POA: Insufficient documentation

## 2016-04-05 ENCOUNTER — Encounter (HOSPITAL_COMMUNITY)
Admission: RE | Admit: 2016-04-05 | Discharge: 2016-04-05 | Disposition: A | Discharge status: Home / Self Care | Payer: Self-pay | Source: Ambulatory Visit | Attending: Cardiology | Admitting: Cardiology

## 2016-04-09 ENCOUNTER — Other Ambulatory Visit: Payer: Self-pay | Admitting: Cardiology

## 2016-04-09 ENCOUNTER — Encounter (HOSPITAL_COMMUNITY)
Admission: RE | Admit: 2016-04-09 | Discharge: 2016-04-09 | Disposition: A | Discharge status: Home / Self Care | Payer: Self-pay | Source: Ambulatory Visit | Attending: Cardiology | Admitting: Cardiology

## 2016-04-11 ENCOUNTER — Encounter (HOSPITAL_COMMUNITY)
Admission: RE | Admit: 2016-04-11 | Discharge: 2016-04-11 | Disposition: A | Discharge status: Home / Self Care | Payer: Self-pay | Source: Ambulatory Visit | Attending: Cardiology | Admitting: Cardiology

## 2016-04-12 ENCOUNTER — Encounter (HOSPITAL_COMMUNITY)
Admission: RE | Admit: 2016-04-12 | Discharge: 2016-04-12 | Disposition: A | Discharge status: Home / Self Care | Payer: Self-pay | Source: Ambulatory Visit | Attending: Cardiology | Admitting: Cardiology

## 2016-04-15 ENCOUNTER — Ambulatory Visit: Payer: Medicare Other | Admitting: Family Medicine

## 2016-04-16 ENCOUNTER — Encounter (HOSPITAL_COMMUNITY)
Admission: RE | Admit: 2016-04-16 | Discharge: 2016-04-16 | Disposition: A | Discharge status: Home / Self Care | Payer: Self-pay | Source: Ambulatory Visit | Attending: Cardiology | Admitting: Cardiology

## 2016-04-18 ENCOUNTER — Encounter (HOSPITAL_COMMUNITY)
Admission: RE | Admit: 2016-04-18 | Discharge: 2016-04-18 | Disposition: A | Discharge status: Home / Self Care | Payer: Self-pay | Source: Ambulatory Visit | Attending: Cardiology | Admitting: Cardiology

## 2016-04-19 ENCOUNTER — Encounter (HOSPITAL_COMMUNITY)
Admission: RE | Admit: 2016-04-19 | Discharge: 2016-04-19 | Disposition: A | Discharge status: Home / Self Care | Payer: Self-pay | Source: Ambulatory Visit | Attending: Cardiology | Admitting: Cardiology

## 2016-04-23 ENCOUNTER — Encounter (HOSPITAL_COMMUNITY)
Admission: RE | Admit: 2016-04-23 | Discharge: 2016-04-23 | Disposition: A | Discharge status: Home / Self Care | Payer: Self-pay | Source: Ambulatory Visit | Attending: Cardiology | Admitting: Cardiology

## 2016-04-25 ENCOUNTER — Encounter (HOSPITAL_COMMUNITY)
Admission: RE | Admit: 2016-04-25 | Discharge: 2016-04-25 | Disposition: A | Discharge status: Home / Self Care | Payer: Self-pay | Source: Ambulatory Visit | Attending: Cardiology | Admitting: Cardiology

## 2016-04-26 ENCOUNTER — Encounter (HOSPITAL_COMMUNITY)
Admission: RE | Admit: 2016-04-26 | Discharge: 2016-04-26 | Disposition: A | Discharge status: Home / Self Care | Payer: Self-pay | Source: Ambulatory Visit | Attending: Cardiology | Admitting: Cardiology

## 2016-04-30 ENCOUNTER — Encounter (HOSPITAL_COMMUNITY)
Admission: RE | Admit: 2016-04-30 | Discharge: 2016-04-30 | Disposition: A | Discharge status: Home / Self Care | Payer: Self-pay | Source: Ambulatory Visit | Attending: Cardiology | Admitting: Cardiology

## 2016-04-30 ENCOUNTER — Other Ambulatory Visit: Payer: Self-pay | Admitting: Family Medicine

## 2016-05-02 ENCOUNTER — Encounter (HOSPITAL_COMMUNITY)
Admission: RE | Admit: 2016-05-02 | Discharge: 2016-05-02 | Disposition: A | Discharge status: Home / Self Care | Payer: Self-pay | Source: Ambulatory Visit | Attending: Cardiology | Admitting: Cardiology

## 2016-05-03 ENCOUNTER — Encounter (HOSPITAL_COMMUNITY)
Admission: RE | Admit: 2016-05-03 | Discharge: 2016-05-03 | Disposition: A | Discharge status: Home / Self Care | Payer: Self-pay | Source: Ambulatory Visit | Attending: Cardiology | Admitting: Cardiology

## 2016-05-03 DIAGNOSIS — I2510 Atherosclerotic heart disease of native coronary artery without angina pectoris: Secondary | ICD-10-CM | POA: Insufficient documentation

## 2016-05-07 ENCOUNTER — Encounter (HOSPITAL_COMMUNITY)
Admission: RE | Admit: 2016-05-07 | Discharge: 2016-05-07 | Disposition: A | Discharge status: Home / Self Care | Payer: Self-pay | Source: Ambulatory Visit | Attending: Cardiology | Admitting: Cardiology

## 2016-05-09 ENCOUNTER — Encounter (HOSPITAL_COMMUNITY)
Admission: RE | Admit: 2016-05-09 | Discharge: 2016-05-09 | Disposition: A | Discharge status: Home / Self Care | Payer: Self-pay | Source: Ambulatory Visit | Attending: Cardiology | Admitting: Cardiology

## 2016-05-10 ENCOUNTER — Encounter (HOSPITAL_COMMUNITY)
Admission: RE | Admit: 2016-05-10 | Discharge: 2016-05-10 | Disposition: A | Discharge status: Home / Self Care | Payer: Self-pay | Source: Ambulatory Visit | Attending: Cardiology | Admitting: Cardiology

## 2016-05-14 ENCOUNTER — Encounter (HOSPITAL_COMMUNITY)
Admission: RE | Admit: 2016-05-14 | Discharge: 2016-05-14 | Disposition: A | Discharge status: Home / Self Care | Payer: Self-pay | Source: Ambulatory Visit | Attending: Cardiology | Admitting: Cardiology

## 2016-05-14 ENCOUNTER — Encounter: Payer: Self-pay | Admitting: Family Medicine

## 2016-05-14 ENCOUNTER — Ambulatory Visit (INDEPENDENT_AMBULATORY_CARE_PROVIDER_SITE_OTHER): Payer: Medicare Other | Admitting: Family Medicine

## 2016-05-14 VITALS — BP 128/80 | HR 65 | Temp 98.1°F | Resp 16 | Ht 68.0 in | Wt 197.2 lb

## 2016-05-14 DIAGNOSIS — W57XXXA Bitten or stung by nonvenomous insect and other nonvenomous arthropods, initial encounter: Secondary | ICD-10-CM | POA: Diagnosis not present

## 2016-05-14 DIAGNOSIS — S40862A Insect bite (nonvenomous) of left upper arm, initial encounter: Secondary | ICD-10-CM | POA: Diagnosis not present

## 2016-05-14 DIAGNOSIS — S70361A Insect bite (nonvenomous), right thigh, initial encounter: Secondary | ICD-10-CM | POA: Diagnosis not present

## 2016-05-14 DIAGNOSIS — S70362A Insect bite (nonvenomous), left thigh, initial encounter: Secondary | ICD-10-CM

## 2016-05-14 MED ORDER — TRIAMCINOLONE ACETONIDE 0.1 % EX OINT: 1.0000 "application " | TOPICAL_OINTMENT | Freq: Two times a day (BID) | CUTANEOUS | 1 refills | Status: DC

## 2016-05-14 NOTE — Patient Instructions (Signed)
Follow up as needed Apply the triamcinolone ointment twice daily Take Claritin or Zyrtec daily to help w/ the itching Take Benadryl prior to bed Call with any questions or concerns Hang in there!!!

## 2016-05-14 NOTE — Progress Notes (Signed)
   Subjective:    Patient ID: Jacob Curry, male    DOB: 1941-03-01, 75 y.o.   MRN: [id number]  HPI Rash- pt reports itchy red spots appeared ~1 week ago.  Areas first appeared on L inner thigh.  Now on R thigh, buttock, now on L upper arm.  No areas between fingers or toes.  Wife w/o similar areas.  No recent change in sleeping arrangements.     Review of Systems For ROS see HPI     Objective:   Physical Exam  Constitutional: He is oriented to person, place, and time. He appears well-developed and well-nourished. No distress.  HENT:  Head: Normocephalic and atraumatic.  Neurological: He is alert and oriented to person, place, and time.  Skin: Skin is warm and dry. No rash noted. There is erythema (redness surrounding multiple insect bites on thighs bilaterally and L upper arm).  Psychiatric: He has a normal mood and affect. His behavior is normal. Thought content normal.  Vitals reviewed.         Assessment & Plan:  Bug bites- reassured pt that this is not a rash and is consistent w/ insect bites.  Start topical steroid ointment, antihistamines daily and benadryl prior to bed.  Reviewed supportive care and red flags that should prompt return.  Pt expressed understanding and is in agreement w/ plan.

## 2016-05-14 NOTE — Progress Notes (Signed)
Pre visit review using our clinic review tool, if applicable. No additional management support is needed unless otherwise documented below in the visit note. 

## 2016-05-15 ENCOUNTER — Ambulatory Visit: Payer: Medicare Other | Admitting: Family Medicine

## 2016-05-16 ENCOUNTER — Encounter (HOSPITAL_COMMUNITY)
Admission: RE | Admit: 2016-05-16 | Discharge: 2016-05-16 | Disposition: A | Discharge status: Home / Self Care | Payer: Self-pay | Source: Ambulatory Visit | Attending: Cardiology | Admitting: Cardiology

## 2016-05-17 ENCOUNTER — Encounter (HOSPITAL_COMMUNITY)
Admission: RE | Admit: 2016-05-17 | Discharge: 2016-05-17 | Disposition: A | Discharge status: Home / Self Care | Payer: Self-pay | Source: Ambulatory Visit | Attending: Cardiology | Admitting: Cardiology

## 2016-05-21 ENCOUNTER — Encounter (HOSPITAL_COMMUNITY): Payer: Self-pay

## 2016-05-23 ENCOUNTER — Encounter (HOSPITAL_COMMUNITY)
Admission: RE | Admit: 2016-05-23 | Discharge: 2016-05-23 | Disposition: A | Discharge status: Home / Self Care | Payer: Self-pay | Source: Ambulatory Visit | Attending: Cardiology | Admitting: Cardiology

## 2016-05-24 ENCOUNTER — Encounter (HOSPITAL_COMMUNITY)
Admission: RE | Admit: 2016-05-24 | Discharge: 2016-05-24 | Disposition: A | Discharge status: Home / Self Care | Payer: Self-pay | Source: Ambulatory Visit | Attending: Cardiology | Admitting: Cardiology

## 2016-05-28 ENCOUNTER — Encounter (HOSPITAL_COMMUNITY)
Admission: RE | Admit: 2016-05-28 | Discharge: 2016-05-28 | Disposition: A | Discharge status: Home / Self Care | Payer: Self-pay | Source: Ambulatory Visit | Attending: Cardiology | Admitting: Cardiology

## 2016-05-30 ENCOUNTER — Encounter (HOSPITAL_COMMUNITY)
Admission: RE | Admit: 2016-05-30 | Discharge: 2016-05-30 | Disposition: A | Discharge status: Home / Self Care | Payer: Self-pay | Source: Ambulatory Visit | Attending: Cardiology | Admitting: Cardiology

## 2016-05-31 ENCOUNTER — Encounter (HOSPITAL_COMMUNITY)
Admission: RE | Admit: 2016-05-31 | Discharge: 2016-05-31 | Disposition: A | Discharge status: Home / Self Care | Payer: Self-pay | Source: Ambulatory Visit | Attending: Cardiology | Admitting: Cardiology

## 2016-06-04 ENCOUNTER — Encounter (HOSPITAL_COMMUNITY)
Admission: RE | Admit: 2016-06-04 | Discharge: 2016-06-04 | Disposition: A | Discharge status: Home / Self Care | Payer: Self-pay | Source: Ambulatory Visit | Attending: Cardiology | Admitting: Cardiology

## 2016-06-04 DIAGNOSIS — I2510 Atherosclerotic heart disease of native coronary artery without angina pectoris: Secondary | ICD-10-CM | POA: Insufficient documentation

## 2016-06-04 DIAGNOSIS — L820 Inflamed seborrheic keratosis: Secondary | ICD-10-CM | POA: Diagnosis not present

## 2016-06-06 ENCOUNTER — Encounter (HOSPITAL_COMMUNITY)
Admission: RE | Admit: 2016-06-06 | Discharge: 2016-06-06 | Disposition: A | Discharge status: Home / Self Care | Payer: Self-pay | Source: Ambulatory Visit | Attending: Cardiology | Admitting: Cardiology

## 2016-06-07 ENCOUNTER — Encounter (HOSPITAL_COMMUNITY)
Admission: RE | Admit: 2016-06-07 | Discharge: 2016-06-07 | Disposition: A | Discharge status: Home / Self Care | Payer: Self-pay | Source: Ambulatory Visit | Attending: Cardiology | Admitting: Cardiology

## 2016-06-10 ENCOUNTER — Ambulatory Visit (INDEPENDENT_AMBULATORY_CARE_PROVIDER_SITE_OTHER): Payer: Medicare Other

## 2016-06-10 DIAGNOSIS — Z23 Encounter for immunization: Secondary | ICD-10-CM

## 2016-06-11 ENCOUNTER — Encounter (HOSPITAL_COMMUNITY)
Admission: RE | Admit: 2016-06-11 | Discharge: 2016-06-11 | Disposition: A | Discharge status: Home / Self Care | Payer: Self-pay | Source: Ambulatory Visit | Attending: Cardiology | Admitting: Cardiology

## 2016-06-13 ENCOUNTER — Encounter (HOSPITAL_COMMUNITY)
Admission: RE | Admit: 2016-06-13 | Discharge: 2016-06-13 | Disposition: A | Discharge status: Home / Self Care | Payer: Self-pay | Source: Ambulatory Visit | Attending: Cardiology | Admitting: Cardiology

## 2016-06-13 ENCOUNTER — Telehealth: Payer: Self-pay | Admitting: Cardiology

## 2016-06-13 NOTE — Telephone Encounter (Signed)
Returned call to patient.Eliquis 5 mg samples left at front desk of [facility] office. 

## 2016-06-13 NOTE — Telephone Encounter (Signed)
Jacob Curry is  Calling you about his medication

## 2016-06-14 ENCOUNTER — Encounter (HOSPITAL_COMMUNITY): Payer: Self-pay

## 2016-06-18 ENCOUNTER — Encounter (HOSPITAL_COMMUNITY)
Admission: RE | Admit: 2016-06-18 | Discharge: 2016-06-18 | Disposition: A | Discharge status: Home / Self Care | Payer: Self-pay | Source: Ambulatory Visit | Attending: Cardiology | Admitting: Cardiology

## 2016-06-20 ENCOUNTER — Encounter (HOSPITAL_COMMUNITY)
Admission: RE | Admit: 2016-06-20 | Discharge: 2016-06-20 | Disposition: A | Discharge status: Home / Self Care | Payer: Self-pay | Source: Ambulatory Visit | Attending: Cardiology | Admitting: Cardiology

## 2016-06-21 ENCOUNTER — Encounter (HOSPITAL_COMMUNITY)
Admission: RE | Admit: 2016-06-21 | Discharge: 2016-06-21 | Disposition: A | Discharge status: Home / Self Care | Payer: Self-pay | Source: Ambulatory Visit | Attending: Cardiology | Admitting: Cardiology

## 2016-06-25 ENCOUNTER — Encounter (HOSPITAL_COMMUNITY)
Admission: RE | Admit: 2016-06-25 | Discharge: 2016-06-25 | Disposition: A | Discharge status: Home / Self Care | Payer: Self-pay | Source: Ambulatory Visit | Attending: Cardiology | Admitting: Cardiology

## 2016-06-27 ENCOUNTER — Encounter (HOSPITAL_COMMUNITY): Payer: Self-pay

## 2016-06-28 ENCOUNTER — Encounter (HOSPITAL_COMMUNITY): Payer: Self-pay

## 2016-07-02 ENCOUNTER — Encounter (HOSPITAL_COMMUNITY)
Admission: RE | Admit: 2016-07-02 | Discharge: 2016-07-02 | Disposition: A | Discharge status: Home / Self Care | Payer: Self-pay | Source: Ambulatory Visit | Attending: Cardiology | Admitting: Cardiology

## 2016-07-04 ENCOUNTER — Encounter (HOSPITAL_COMMUNITY)
Admission: RE | Admit: 2016-07-04 | Discharge: 2016-07-04 | Disposition: A | Discharge status: Home / Self Care | Payer: Self-pay | Source: Ambulatory Visit | Attending: Cardiology | Admitting: Cardiology

## 2016-07-04 DIAGNOSIS — I2510 Atherosclerotic heart disease of native coronary artery without angina pectoris: Secondary | ICD-10-CM | POA: Insufficient documentation

## 2016-07-05 ENCOUNTER — Encounter (HOSPITAL_COMMUNITY): Payer: Self-pay

## 2016-07-09 ENCOUNTER — Encounter (HOSPITAL_COMMUNITY): Payer: Self-pay

## 2016-07-10 ENCOUNTER — Other Ambulatory Visit: Payer: Self-pay | Admitting: Cardiology

## 2016-07-10 NOTE — Telephone Encounter (Signed)
REFILL 

## 2016-07-11 ENCOUNTER — Encounter (HOSPITAL_COMMUNITY): Payer: Self-pay

## 2016-07-12 ENCOUNTER — Encounter (HOSPITAL_COMMUNITY): Payer: Self-pay

## 2016-07-15 ENCOUNTER — Encounter: Payer: Self-pay | Admitting: Family Medicine

## 2016-07-15 ENCOUNTER — Ambulatory Visit (INDEPENDENT_AMBULATORY_CARE_PROVIDER_SITE_OTHER): Payer: Medicare Other | Admitting: Family Medicine

## 2016-07-15 VITALS — BP 124/84 | HR 78 | Temp 97.9°F | Resp 18 | Ht 68.0 in | Wt 194.0 lb

## 2016-07-15 DIAGNOSIS — I2510 Atherosclerotic heart disease of native coronary artery without angina pectoris: Secondary | ICD-10-CM | POA: Diagnosis not present

## 2016-07-15 DIAGNOSIS — J209 Acute bronchitis, unspecified: Secondary | ICD-10-CM

## 2016-07-15 MED ORDER — AZITHROMYCIN 250 MG PO TABS: ORAL_TABLET | ORAL | 0 refills | Status: DC

## 2016-07-15 MED ORDER — METFORMIN HCL 500 MG PO TABS: 500.0000 mg | ORAL_TABLET | Freq: Every morning | ORAL | 1 refills | Status: DC

## 2016-07-15 NOTE — Progress Notes (Signed)
Pre visit review using our clinic review tool, if applicable. No additional management support is needed unless otherwise documented below in the visit note. 

## 2016-07-15 NOTE — Progress Notes (Signed)
   Subjective:    Patient ID: Jacob Curry, male    DOB: 19-Jul-1941, 75 y.o.   MRN: [id number]  HPI Cough- sxs started ~10 days ago.  Denies sick contacts, travel, fevers, chills, N/V/D.  Hx of similar 'which lead to bronchitis and Pneumonia'.  Taking Mucinex w/o relief.  Pt denies nasal congestion, sinus pain/pressure.  'just the cough.  Every year, same stuff'.  Pt has hx of diabetes.   Review of Systems For ROS see HPI     Objective:   Physical Exam  Constitutional: He is oriented to person, place, and time. He appears well-developed and well-nourished. No distress.  HENT:  Head: Normocephalic and atraumatic.  Right Ear: Tympanic membrane normal.  Left Ear: Tympanic membrane normal.  Nose: No mucosal edema or rhinorrhea. Right sinus exhibits no maxillary sinus tenderness and no frontal sinus tenderness. Left sinus exhibits no maxillary sinus tenderness and no frontal sinus tenderness.  Mouth/Throat: Mucous membranes are normal. No oropharyngeal exudate, posterior oropharyngeal edema or posterior oropharyngeal erythema.  Eyes: Conjunctivae and EOM are normal. Pupils are equal, round, and reactive to light.  Neck: Normal range of motion. Neck supple.  Cardiovascular: Normal rate, regular rhythm and normal heart sounds.   Pulmonary/Chest: Effort normal and breath sounds normal. No respiratory distress. He has no wheezes.  + hacking cough  Lymphadenopathy:    He has no cervical adenopathy.  Neurological: He is alert and oriented to person, place, and time.  Skin: Skin is warm and dry.  Psychiatric: He has a normal mood and affect. His behavior is normal. Thought content normal.  Vitals reviewed.         Assessment & Plan:  Bronchitis- new.  Pt's lack of improvement in the last 10 days and the fact that he feels worse now that last week is concerning.  Given his hx of Afib and DM, will start abx.  Cough meds prn.  Reviewed supportive care and red flags that should prompt  return.  Pt expressed understanding and is in agreement w/ plan.

## 2016-07-15 NOTE — Patient Instructions (Signed)
Follow up as scheduled in December for your physical Start the Zpack as directed OTC Mucinex DM or Delsym as directed Drink plenty of fluids REST! I sent the Metformin to [facility] Call with any questions or concerns FEEL BETTER! Happy Thanksgiving!!!

## 2016-07-16 ENCOUNTER — Encounter (HOSPITAL_COMMUNITY): Payer: Self-pay

## 2016-07-18 ENCOUNTER — Encounter (HOSPITAL_COMMUNITY)
Admission: RE | Admit: 2016-07-18 | Discharge: 2016-07-18 | Disposition: A | Discharge status: Home / Self Care | Payer: Self-pay | Source: Ambulatory Visit | Attending: Cardiology | Admitting: Cardiology

## 2016-07-19 ENCOUNTER — Encounter (HOSPITAL_COMMUNITY)
Admission: RE | Admit: 2016-07-19 | Discharge: 2016-07-19 | Disposition: A | Discharge status: Home / Self Care | Payer: Self-pay | Source: Ambulatory Visit | Attending: Cardiology | Admitting: Cardiology

## 2016-07-23 ENCOUNTER — Encounter (HOSPITAL_COMMUNITY)
Admission: RE | Admit: 2016-07-23 | Discharge: 2016-07-23 | Disposition: A | Discharge status: Home / Self Care | Payer: Self-pay | Source: Ambulatory Visit | Attending: Cardiology | Admitting: Cardiology

## 2016-07-30 ENCOUNTER — Encounter (HOSPITAL_COMMUNITY)
Admission: RE | Admit: 2016-07-30 | Discharge: 2016-07-30 | Disposition: A | Discharge status: Home / Self Care | Payer: Self-pay | Source: Ambulatory Visit | Attending: Cardiology | Admitting: Cardiology

## 2016-08-01 ENCOUNTER — Encounter (HOSPITAL_COMMUNITY)
Admission: RE | Admit: 2016-08-01 | Discharge: 2016-08-01 | Disposition: A | Discharge status: Home / Self Care | Payer: Self-pay | Source: Ambulatory Visit | Attending: Cardiology | Admitting: Cardiology

## 2016-08-02 ENCOUNTER — Encounter (HOSPITAL_COMMUNITY)
Admission: RE | Admit: 2016-08-02 | Discharge: 2016-08-02 | Disposition: A | Discharge status: Home / Self Care | Payer: Self-pay | Source: Ambulatory Visit | Attending: Cardiology | Admitting: Cardiology

## 2016-08-02 DIAGNOSIS — I2510 Atherosclerotic heart disease of native coronary artery without angina pectoris: Secondary | ICD-10-CM | POA: Insufficient documentation

## 2016-08-06 ENCOUNTER — Encounter (HOSPITAL_COMMUNITY)
Admission: RE | Admit: 2016-08-06 | Discharge: 2016-08-06 | Disposition: A | Discharge status: Home / Self Care | Payer: Self-pay | Source: Ambulatory Visit | Attending: Cardiology | Admitting: Cardiology

## 2016-08-06 ENCOUNTER — Ambulatory Visit: Payer: Medicare Other | Admitting: Family Medicine

## 2016-08-08 ENCOUNTER — Encounter (HOSPITAL_COMMUNITY)
Admission: RE | Admit: 2016-08-08 | Discharge: 2016-08-08 | Disposition: A | Discharge status: Home / Self Care | Payer: Self-pay | Source: Ambulatory Visit | Attending: Cardiology | Admitting: Cardiology

## 2016-08-09 ENCOUNTER — Encounter (HOSPITAL_COMMUNITY)
Admission: RE | Admit: 2016-08-09 | Discharge: 2016-08-09 | Disposition: A | Discharge status: Home / Self Care | Payer: Self-pay | Source: Ambulatory Visit | Attending: Cardiology | Admitting: Cardiology

## 2016-08-09 DIAGNOSIS — N401 Enlarged prostate with lower urinary tract symptoms: Secondary | ICD-10-CM | POA: Diagnosis not present

## 2016-08-09 DIAGNOSIS — N138 Other obstructive and reflux uropathy: Secondary | ICD-10-CM | POA: Diagnosis not present

## 2016-08-13 ENCOUNTER — Encounter (HOSPITAL_COMMUNITY)
Admission: RE | Admit: 2016-08-13 | Discharge: 2016-08-13 | Disposition: A | Discharge status: Home / Self Care | Payer: Self-pay | Source: Ambulatory Visit | Attending: Cardiology | Admitting: Cardiology

## 2016-08-15 ENCOUNTER — Encounter (HOSPITAL_COMMUNITY)
Admission: RE | Admit: 2016-08-15 | Discharge: 2016-08-15 | Disposition: A | Discharge status: Home / Self Care | Payer: Self-pay | Source: Ambulatory Visit | Attending: Cardiology | Admitting: Cardiology

## 2016-08-16 ENCOUNTER — Encounter (HOSPITAL_COMMUNITY)
Admission: RE | Admit: 2016-08-16 | Discharge: 2016-08-16 | Disposition: A | Discharge status: Home / Self Care | Payer: Self-pay | Source: Ambulatory Visit | Attending: Cardiology | Admitting: Cardiology

## 2016-08-18 NOTE — Progress Notes (Signed)
[name] Date of Birth: [birth year] Medical Record [id number]  History of Present Illness: Jacob Curry is seen for follow up CAD. He has a known history of coronary disease with cardiac catheterization in November 2007 showing total occlusion of a large left circumflex vessel with good left to left collaterals. There was 40% disease in the left main and origin of LAD. 90% mid diagonal, 80-90% mid RCA and 90% RV marginal branch.  Ejection fraction was 50-55%. Prior to this he had a nuclear stress test- he was able to exercise 8:30 with severe ischemia of the inferolateral wall. He has been managed medically. Repeat cardiac cath in May 2011 and May 2016 showed no significant change. He has a history of hypertension, hyperlipidemia, Pafib,  diabetes. He has a history of moderate obstructive sleep apnea. This is managed with an oral appliance. He was unable to tolerate CPAP therapy.  He is anticoagulated with Eliquis.   On follow up today he is doing well.  Denies any chest pain, SOB, edema, or palpitations. He is active with cardiac Rehab. He is sleeping well and states his appliance really helps. Overall feels very well.   Current Outpatient Prescriptions on File Prior to Visit  Medication Sig Dispense Refill  . amLODipine (NORVASC) 2.5 MG tablet Take 1 tablet (2.5 mg total) by mouth daily. KEEP OV. 90 tablet 0  . apixaban (ELIQUIS) 5 MG TABS tablet Take 1 tablet (5 mg total) by mouth 2 (two) times daily. 60 tablet 5  . carvedilol (COREG) 12.5 MG tablet Take 1.5 tablets (18.75 mg total) by mouth 2 (two) times daily with a meal. KEEP OV. 270 tablet 0  . EPINEPHrine 0.3 mg/0.3 mL IJ SOAJ injection Inject 0.3 mLs (0.3 mg total) into the muscle once. 1 Device 3  . fish oil-omega-3 fatty acids 1000 MG capsule Take 2 g by mouth daily.      Marland Kitchen losartan (COZAAR) 100 MG tablet Take 1 tablet (100 mg total) by mouth daily. KEEP OV. 90 tablet 0  . metFORMIN (GLUCOPHAGE) 500 MG tablet Take 1 tablet (500 mg  total) by mouth every morning. 90 tablet 1  . nitroGLYCERIN (NITROSTAT) 0.4 MG SL tablet Place 1 tablet (0.4 mg total) under the tongue every 5 (five) minutes as needed. 25 tablet 11  . Saw Palmetto, Serenoa repens, (SAW PALMETTO PO) Take 1 tablet by mouth daily.      . simvastatin (ZOCOR) 40 MG tablet TAKE 1 TABLET AT BEDTIME 90 tablet 1   No current facility-administered medications on file prior to visit.     Allergies  Allergen Reactions  . Acetaminophen Other (See Comments)    Drug induced hepatitis  . Oxycodone-Acetaminophen Other (See Comments)    Drug induced hepatitis  . Flomax [Tamsulosin Hcl] Other (See Comments)    incontinence    Past Medical History:  Diagnosis Date  . CAD (coronary artery disease)    Dr Tesla Bochicchio [place]  . Carotid stenosis   . Complication of anesthesia    " DIFFICULTY WAKING "  . DM (diabetes mellitus) ([place])   . ED (erectile dysfunction)   . HLD (hyperlipidemia)   . HTN (hypertension)   . Myocardial infarction 2007  . Obesity   . OSA (obstructive sleep apnea)    oral appliance, Dr Ron Parker  . Paroxysmal atrial fibrillation [facility])     Past Surgical History:  Procedure Laterality Date  . [place]  . APPENDECTOMY  1958  . CARDIAC CATHETERIZATION  2007 &  2011  . CARDIAC CATHETERIZATION N/A 01/13/2015   Procedure: Left Heart Cath and Coronary Angiography;  Surgeon: Mykaylah Ballman M Martinique, MD;  Location: [facility];  Service: Cardiovascular;  Laterality: N/A;  . COLONOSCOPY  2002   negative; Dr Olevia Perches  . [place]  . TONSILLECTOMY      History  Smoking Status  . Former Smoker  . Packs/day: 1.00  . Quit date: 09/02/1990  Smokeless Tobacco  . Never Used    Comment: onset age 35-50 up to 1 ppd    History  Alcohol Use No    Family History  Problem Relation Age of Onset  . Diabetes Mother   . Heart attack Father      4 vessel CBAG @ 45  . Colon cancer Paternal Uncle   . Diabetes Brother   . Stroke Neg Hx      Review of Systems: As noted in history of present illness.  All other systems were reviewed and are negative.  Physical Exam: BP 130/68 (BP Location: Right Arm, Patient Position: Sitting, Cuff Size: Normal)   Pulse 64   Ht 5\' 8"  (1.727 m)   Wt 195 lb (88.5 kg)   SpO2 96%   BMI 29.65 kg/m  He is a pleasant white male in no acute distress. HEENT exam is unremarkable. He has no jugular venous distention or bruits. There is no adenopathy or thyromegaly. Cardiac exam reveals a regular rate and rhythm. There is no  gallop, murmur, or click. PMI is normal. Lungs are clear. Abdomen is soft and nontender without masses or bruits. He has normal bowel sounds. Extremities are without cyanosis or edema. Pedal pulses are 2+ and symmetric. He is alert and oriented x3. Cranial nerves II through XII are intact. He has no focal findings.  LABORATORY DATA: Lab Results  Component Value Date   WBC 8.9 02/05/2016   HGB 13.8 02/05/2016   HCT 41.2 02/05/2016   PLT 200.0 02/05/2016   GLUCOSE 131 (H) 02/05/2016   CHOL 115 02/05/2016   TRIG 133.0 02/05/2016   HDL 45.50 02/05/2016   LDLDIRECT 39.9 07/22/2008   LDLCALC 43 02/05/2016   ALT 21 02/05/2016   AST 20 02/05/2016   NA 141 02/05/2016   K 4.1 02/05/2016   CL 108 02/05/2016   CREATININE 0.89 02/05/2016   BUN 17 02/05/2016   CO2 27 02/05/2016   TSH 3.14 02/05/2016   PSA 1.70 01/04/2009   INR 1.75 (H) 01/12/2015   HGBA1C 6.4 02/05/2016   MICROALBUR 0.4 03/09/2012     Assessment / Plan: 1. 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 Moderate to severe 3 vessel disease. Patient is asymptomatic on medical therapy. Cardiac cath  in May 2016 unchanged from 2007 and 2011.  Will continue medical therapy and monitor for any new symptoms.  He is on optimal medical therapy.  2. Hypertension, controlled  3. Hyperlipidemia well controlled on medication.    4. Atrial fibrillation, paroxysmal. Patient is in sinus rhythm. His  rate was controlled on beta blocker therapy. He is asymptomatic. He does have a high Chadvasc score of 4. Continue anticoagulation with Eliquis 5 mg twice a day.   5. DM type 2. On metformin- followed by primary care. Focus on avoiding sweets and weight loss.   I will follow up in 6 months.

## 2016-08-20 ENCOUNTER — Encounter: Payer: Self-pay | Admitting: Cardiology

## 2016-08-20 ENCOUNTER — Other Ambulatory Visit: Payer: Self-pay

## 2016-08-20 ENCOUNTER — Ambulatory Visit (INDEPENDENT_AMBULATORY_CARE_PROVIDER_SITE_OTHER): Payer: Medicare Other | Admitting: Cardiology

## 2016-08-20 ENCOUNTER — Encounter (HOSPITAL_COMMUNITY)
Admission: RE | Admit: 2016-08-20 | Discharge: 2016-08-20 | Disposition: A | Discharge status: Home / Self Care | Payer: Self-pay | Source: Ambulatory Visit | Attending: Cardiology | Admitting: Cardiology

## 2016-08-20 VITALS — BP 130/68 | HR 64 | Ht 68.0 in | Wt 195.0 lb

## 2016-08-20 DIAGNOSIS — I10 Essential (primary) hypertension: Secondary | ICD-10-CM | POA: Diagnosis not present

## 2016-08-20 DIAGNOSIS — I2510 Atherosclerotic heart disease of native coronary artery without angina pectoris: Secondary | ICD-10-CM | POA: Diagnosis not present

## 2016-08-20 DIAGNOSIS — I480 Paroxysmal atrial fibrillation: Secondary | ICD-10-CM

## 2016-08-20 DIAGNOSIS — E1159 Type 2 diabetes mellitus with other circulatory complications: Secondary | ICD-10-CM

## 2016-08-20 DIAGNOSIS — E7800 Pure hypercholesterolemia, unspecified: Secondary | ICD-10-CM | POA: Diagnosis not present

## 2016-08-20 MED ORDER — NITROGLYCERIN 0.4 MG SL SUBL: 0.4000 mg | SUBLINGUAL_TABLET | SUBLINGUAL | 11 refills | Status: DC | PRN

## 2016-08-20 NOTE — Patient Instructions (Signed)
Continue your current therapy  I will see you in 6 months.   

## 2016-08-22 ENCOUNTER — Encounter (HOSPITAL_COMMUNITY)
Admission: RE | Admit: 2016-08-22 | Discharge: 2016-08-22 | Disposition: A | Discharge status: Home / Self Care | Payer: Medicare Other | Source: Ambulatory Visit | Attending: Cardiology | Admitting: Cardiology

## 2016-08-23 ENCOUNTER — Encounter (HOSPITAL_COMMUNITY)
Admission: RE | Admit: 2016-08-23 | Discharge: 2016-08-23 | Disposition: A | Discharge status: Home / Self Care | Payer: Self-pay | Source: Ambulatory Visit | Attending: Cardiology | Admitting: Cardiology

## 2016-08-27 ENCOUNTER — Encounter (HOSPITAL_COMMUNITY)
Admission: RE | Admit: 2016-08-27 | Discharge: 2016-08-27 | Disposition: A | Discharge status: Home / Self Care | Payer: Self-pay | Source: Ambulatory Visit | Attending: Cardiology | Admitting: Cardiology

## 2016-08-29 ENCOUNTER — Encounter (HOSPITAL_COMMUNITY): Payer: Self-pay

## 2016-08-30 ENCOUNTER — Encounter (HOSPITAL_COMMUNITY): Payer: Self-pay

## 2016-09-02 ENCOUNTER — Other Ambulatory Visit (INDEPENDENT_AMBULATORY_CARE_PROVIDER_SITE_OTHER): Payer: Self-pay | Admitting: Specialist

## 2016-09-02 MED ORDER — TRAMADOL HCL 50 MG PO TABS: 100.0000 mg | ORAL_TABLET | Freq: Four times a day (QID) | ORAL | 1 refills | Status: DC | PRN

## 2016-09-02 MED ORDER — METHYLPREDNISOLONE 4 MG PO TBPK: ORAL_TABLET | ORAL | 0 refills | Status: DC

## 2016-09-02 NOTE — Progress Notes (Unsigned)
Jacob Curry called me indicating an episode of severe back pain with associated leg pain and sciatica. Began 4 days ago with pain. No numbness or weakness. Difficulty walking even to the bathroom, hard to straighten up his back.He has had previous tylenol related elevated liver function tests while on meds 25 years ago when he had a severe pilon fracture. I told him I would call in a medrol dose pak and some tramadol For discomfort. He will call the office in the AM for an appointment.

## 2016-09-03 ENCOUNTER — Ambulatory Visit (INDEPENDENT_AMBULATORY_CARE_PROVIDER_SITE_OTHER): Payer: Medicare Other

## 2016-09-03 ENCOUNTER — Ambulatory Visit (INDEPENDENT_AMBULATORY_CARE_PROVIDER_SITE_OTHER): Payer: Self-pay

## 2016-09-03 ENCOUNTER — Encounter (HOSPITAL_COMMUNITY): Payer: Self-pay | Attending: Cardiology

## 2016-09-03 ENCOUNTER — Encounter (INDEPENDENT_AMBULATORY_CARE_PROVIDER_SITE_OTHER): Payer: Self-pay | Admitting: Orthopaedic Surgery

## 2016-09-03 ENCOUNTER — Ambulatory Visit (INDEPENDENT_AMBULATORY_CARE_PROVIDER_SITE_OTHER): Payer: Medicare Other | Admitting: Orthopaedic Surgery

## 2016-09-03 VITALS — BP 146/68 | HR 67 | Ht 68.0 in | Wt 187.0 lb

## 2016-09-03 DIAGNOSIS — M25552 Pain in left hip: Secondary | ICD-10-CM

## 2016-09-03 DIAGNOSIS — M545 Low back pain, unspecified: Secondary | ICD-10-CM | POA: Insufficient documentation

## 2016-09-03 DIAGNOSIS — I2510 Atherosclerotic heart disease of native coronary artery without angina pectoris: Secondary | ICD-10-CM | POA: Insufficient documentation

## 2016-09-03 NOTE — Progress Notes (Signed)
Patient was worked in on Dr. Lorin Mercy schedule

## 2016-09-03 NOTE — Progress Notes (Signed)
Office Visit Note   Patient: Jacob Curry           Date of Birth: 1940/10/07           MRN: [id number] Visit Date: 09/03/2016              Requested by: Midge Minium, MD [address] [place], [place] [address] PCP: Annye Asa, MD   Assessment & Plan: Visit Diagnoses:  1. Pain in left hip   2. Acute left-sided low back pain, with sciatica presence unspecified            Likely L4-5 disc bulge with pre-existing acquired stenosis.          Patient's hip exam is normal. He has significant disc space narrowing and likely has some disc bulging causing his acute symptoms at the L4-5 level. Plan: He'll pick up the prednisone pack and ultram  Dr. Louanne Skye called in yesterday. He'll take the cortisone preparation with food. He is on metformin he'll check his sugars once his diabetes carefully call if he gets hyperglycemia. Prescription given for falling walker he can pick up an advanced home care and use to help with ambulation. He is not better in a week he'll call we can consider imaging of the lumbar spine. It is possible that he may have an epidural hematoma since  he is on Eliquis chronically. His symptoms have not changed since the 28th and he has not had the progression and is neurologically intact other than inability to stand up straight.  Follow-Up Instructions: Return in about 2 weeks (around 09/17/2016).   Orders:  Orders Placed This Encounter  Procedures  . XR HIP UNILAT W OR W/O PELVIS 2-3 VIEWS LEFT  . XR Lumbar Spine 2-3 Views   No orders of the defined types were placed in this encounter.     Procedures: No procedures performed   Clinical Data: No additional findings.   Subjective: Chief Complaint  Patient presents with  . Lower Back - Pain  . Left Hip - Pain    Patient presents with low back pain that radiates to his left hip. He states that he has had some soreness for a few weeks, but this pain set in on 08/29/16. He has no known injury. The  pain is worse when he tries to walk. He cannot stand up straight.   Patient has not been able stand up straight since 08/29/2016 he can only walk forward landing on the cane with flexion at the waist and hips. He gets complete relief in supine or sitting position.  Review of Systems 14 point review of systems is reviewed and is remarkable for history of atrial fib coronary artery disease with the several areas of narrowing he has some areas of complete occlusion. Past history of smoking quit 20+ years ago. He worked as a Art gallery manager for many years. Positive for sleep apnea previous ankle fracture treated with ORIF. He takes metformin for his diabetes.   Objective: Vital Signs: BP (!) 146/68   Pulse 67   Ht 5\' 8"  (1.727 m)   Wt 187 lb (84.8 kg)   BMI 28.43 kg/m   Physical Exam  Constitutional: He is oriented to person, place, and time. He appears well-developed and well-nourished.  HENT:  Head: Normocephalic and atraumatic.  Eyes: EOM are normal. Pupils are equal, round, and reactive to light.  Neck: No tracheal deviation present. No thyromegaly present.  Cardiovascular: Normal rate, regular rhythm and intact  distal pulses.   Pulmonary/Chest: Effort normal. He has no wheezes.  Abdominal: Soft. Bowel sounds are normal.  Musculoskeletal:  Patient has good hip range of motion cannot stand up straight. Leg raising 90. Ankle jerk is intact anterior tib EHL gastrocsoleus quads are active. Trochanteric region is mildly tender he has sciatic notch tenderness on the left. Negative popliteal compression test. Negative [place].  Neurological: He is alert and oriented to person, place, and time.  Skin: Skin is warm and dry. Capillary refill takes less than 2 seconds.  Psychiatric: He has a normal mood and affect. His behavior is normal. Judgment and thought content normal.    Ortho Exam negative Faber test. Gastric gastrocsoleus ankle dorsiflexion is normal to resisted testing. Sensory testing to feet  are normal.  Specialty Comments:  No specialty comments available.  Imaging: Xr Hip Unilat W Or W/o Pelvis 2-3 Views Left  Result Date: 09/03/2016 AP pelvis and lateral frog-leg left hip x-rays obtained. This shows a slight osteopenia. No acute fracture degenerative changes are present. Impression x-rays negative for rub pelvis or hip fractures.  Xr Lumbar Spine 2-3 Views  Result Date: 09/03/2016 AP and lateral lumbar spine x-rays obtained that shows some significant calcification of the abdominal aorta and less calcic edition, and iliac arteries. There is significant narrowing of the L 4-5 interspace with spur formation and about 80-90% collapse of the disc space. No scoliosis. No spondylolisthesis. 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 Acute left-sided low back pain 09/03/2016  . History of colonic polyps 07/24/2015  . HCAP (healthcare-associated pneumonia) 01/19/2015  . AKI (acute kidney injury) ([place]) 01/19/2015  . 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([place])   . Sepsis ([place]) 01/12/2015  . Hypotension 01/12/2015  . Pyrexia   . Atrial fibrillation ([place]) 07/13/2013  . Obstructive sleep apnea 07/19/2010  . PREMATURE VENTRICULAR CONTRACTIONS, FREQUENT 01/31/2010  . 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 Hyperlipidemia 10/20/2008  . HYPERTENSION, BENIGN 10/20/2008  . CAD, NATIVE VESSEL 10/20/2008  . VENTRAL HERNIA 01/08/2008  . ERECTILE DYSFUNCTION 07/23/2007  . ACUTE PROSTATITIS 07/23/2007  . METABOLIC SYNDROME X [id number]   Past Medical History:  Diagnosis Date  . CAD (coronary artery disease)    Dr Peter Martinique  . Carotid stenosis   . Complication of anesthesia    " DIFFICULTY WAKING "  . DM (diabetes mellitus) ([place])   . ED (erectile dysfunction)   . HLD (hyperlipidemia)   . HTN (hypertension)   . Myocardial infarction 2007  . Obesity   . OSA (obstructive sleep apnea)    oral  appliance, Dr Ron Parker  . Paroxysmal atrial fibrillation ([facility])     Family History  Problem Relation Age of Onset  . Diabetes Mother   . Heart attack Father      4 vessel CBAG @ 17  . Colon cancer Paternal Uncle   . Diabetes Brother   . Stroke Neg Hx     Past Surgical History:  Procedure Laterality Date  . [place]  . APPENDECTOMY  1958  . CARDIAC CATHETERIZATION  2007 & 2011  . CARDIAC CATHETERIZATION N/A 01/13/2015   Procedure: Left Heart Cath and Coronary Angiography;  Surgeon: Peter M Martinique, MD;  Location: [facility];  Service: Cardiovascular;  Laterality: N/A;  . COLONOSCOPY  2002   negative; Dr Olevia Perches  . [place]  . TONSILLECTOMY  Social History   Occupational History  . Art gallery manager / Haematologist    Social History Main Topics  . Smoking status: Former Smoker    Packs/day: 1.00    Quit date: 09/02/1990  . Smokeless tobacco: Never Used     Comment: onset age [age] up to 1 ppd  . Alcohol use No  . Drug use: Unknown  . Sexual activity: Not on file

## 2016-09-05 ENCOUNTER — Encounter (HOSPITAL_COMMUNITY): Payer: Self-pay

## 2016-09-06 ENCOUNTER — Encounter (HOSPITAL_COMMUNITY): Payer: Self-pay

## 2016-09-10 ENCOUNTER — Encounter (HOSPITAL_COMMUNITY): Payer: Self-pay

## 2016-09-10 ENCOUNTER — Telehealth (INDEPENDENT_AMBULATORY_CARE_PROVIDER_SITE_OTHER): Payer: Self-pay | Admitting: Orthopaedic Surgery

## 2016-09-10 DIAGNOSIS — M545 Low back pain: Secondary | ICD-10-CM

## 2016-09-10 NOTE — Addendum Note (Signed)
Addended byBrand Males on: 09/10/2016 01:54 PM   Modules accepted: Orders

## 2016-09-10 NOTE — Telephone Encounter (Signed)
OK MRI. On eliquis rule out HNP vs epidural hematoma. ROV after scan thanks

## 2016-09-10 NOTE — Telephone Encounter (Signed)
IC pt and advised MRI ordered.

## 2016-09-10 NOTE — Telephone Encounter (Signed)
See note from patient.  Ok to sched MRI Lumbar Spine?

## 2016-09-10 NOTE — Telephone Encounter (Signed)
Pt stated he was not better since last visit, he was told to call in if he wasn't.   pt is requesting an MRI, can we get him scheduled for one?  [phone]

## 2016-09-12 ENCOUNTER — Encounter (HOSPITAL_COMMUNITY): Payer: Self-pay

## 2016-09-13 ENCOUNTER — Encounter (HOSPITAL_COMMUNITY): Payer: Self-pay

## 2016-09-13 ENCOUNTER — Ambulatory Visit
Admission: RE | Admit: 2016-09-13 | Discharge: 2016-09-13 | Disposition: A | Discharge status: Home / Self Care | Payer: Medicare Other | Source: Ambulatory Visit | Attending: Orthopaedic Surgery | Admitting: Orthopaedic Surgery

## 2016-09-13 DIAGNOSIS — M545 Low back pain: Secondary | ICD-10-CM

## 2016-09-13 DIAGNOSIS — M5126 Other intervertebral disc displacement, lumbar region: Secondary | ICD-10-CM | POA: Diagnosis not present

## 2016-09-16 ENCOUNTER — Encounter (INDEPENDENT_AMBULATORY_CARE_PROVIDER_SITE_OTHER): Payer: Self-pay | Admitting: Orthopaedic Surgery

## 2016-09-16 ENCOUNTER — Ambulatory Visit (INDEPENDENT_AMBULATORY_CARE_PROVIDER_SITE_OTHER): Payer: Medicare Other | Admitting: Orthopaedic Surgery

## 2016-09-16 ENCOUNTER — Other Ambulatory Visit: Payer: Self-pay | Admitting: Cardiology

## 2016-09-16 VITALS — BP 131/75 | HR 68 | Ht 68.0 in | Wt 187.0 lb

## 2016-09-16 DIAGNOSIS — M545 Low back pain, unspecified: Secondary | ICD-10-CM

## 2016-09-16 NOTE — Progress Notes (Signed)
Office Visit Note   Patient: Jacob Curry           Date of Birth: 1941-07-20           MRN: [id number] Visit Date: 09/16/2016              Requested by: Midge Minium, MD [address] [place], [place] [address] PCP: Annye Asa, MD   Assessment & Plan: Visit Diagnoses:  1. Acute left-sided low back pain without sciatica     Plan: Reviewed the MRI scan with patient and his wife is present. I gave him a copy of the report. He has a extruded disc fragment more likely than epidural hematoma at L2-3 which is slightly eccentric to the left side. He has some short pedicles which causes moderate stenosis. He is ambulatory with a cane. He is neurologically intact and we will follow conservative treatment course. If he does not improve then we can consider operative intervention or if he develops progressive weakness.  Follow-Up Instructions: No Follow-up on file.   Orders:  No orders of the defined types were placed in this encounter.  No orders of the defined types were placed in this encounter.     Procedures: No procedures performed   Clinical Data: No additional findings.   Subjective: Chief Complaint  Patient presents with  . Lower Back - Pain    Patient is here to review MRI Lumbar Spine. He has finished the Medrol Dospak. He states that he is a little better but is still unable to sleep in the bed.     Review of Systems 14 point review systems updated and is unchanged from 1-18 office visit. He sitting comfortably he has pain when he stands up and when he walks he tends to lean 40 uses his cane for ambulation he does have a walker at home. He denies associated bowel or bladder symptoms.   Objective: Vital Signs: BP 131/75   Pulse 68   Ht 5\' 8"  (1.727 m)   Wt 187 lb (84.8 kg)   BMI 28.43 kg/m   Physical Exam  Constitutional: He is oriented to person, place, and time. He appears well-developed and well-nourished.  HENT:  Head: Normocephalic  and atraumatic.  Eyes: EOM are normal. Pupils are equal, round, and reactive to light.  Neck: No tracheal deviation present. No thyromegaly present.  Cardiovascular: Normal rate.   Pulmonary/Chest: Effort normal. He has no wheezes.  Abdominal: Soft. Bowel sounds are normal.  Musculoskeletal:  Patient is a straight leg raising 90. Carotids are strong hamstrings are normal anterior tib EHL is intact. He can get from sitting to standing. Exam of tori keeps his of hips and waist in a slight flexed position. He has single-point cane with him. He has some numbness in his left leg. Abductors and adductors are strong. No pain with hip range of motion is reach full extension no synovitis of the wrists fingers no rash or exposed skin.  Neurological: He is alert and oriented to person, place, and time.  Skin: Skin is warm and dry. Capillary refill takes less than 2 seconds.  Psychiatric: He has a normal mood and affect. His behavior is normal. Judgment and thought content normal.    Ortho Exam  Specialty Comments:  No specialty comments available.  Imaging: No results found.  Show images for MR Lumbar Spine w/o contrast  Study Result   CLINICAL DATA:  Low back pain. LEFT leg pain for 2 weeks.  Patient on Eliquis, concern for epidural hematoma.  EXAM: MRI LUMBAR SPINE WITHOUT CONTRAST  TECHNIQUE: Multiplanar, multisequence MR imaging of the lumbar spine was performed. No intravenous contrast was administered.  COMPARISON:  Plain films on 09/03/16.  FINDINGS: Segmentation:  Standard.  Alignment:  Anatomic.  Vertebrae: Endplate reactive changes above and below L4-5 related to disc disease.  Conus medullaris: Extends to the L1 level and appears normal.  Paraspinal and other soft tissues: Unremarkable.  Disc levels:  L1-L2: Annular bulge and facet arthropathy. Tiny annular rent. No impingement  L2-L3: Extruded disc fragment, central and to the LEFT, approximately 1 cm  diameter. Slight caudal migration. Facet arthropathy and ligamentum flavum hypertrophy along with short pedicles contribute to moderate stenosis. LEFT L3 nerve root impingement.  L3-L4: Annular bulge. Tiny annular rent. Posterior element hypertrophy short pedicles. No definite impingement.  L4-L5: Disc space narrowing. Osseous ridging with a far-lateral, foraminal, and extraforaminal protrusion. BILATERAL facet arthropathy and ligamentum flavum hypertrophy. Short pedicles. LEFT L4 and LEFT L5 nerve root impingement.  L5-S1: Annular bulge. Posterior element hypertrophy. No impingement.  In this patient on anticoagulants, question is raised of possible epidural hematoma. The focal and spherical nature of the extruded abnormality appear most consistent with a disc herniation, but unusual hemorrhage cannot be completely excluded.  IMPRESSION: Focal abnormality at L2-3, central and to the LEFT, spherical in nature, most consistent with an extruded disc fragment. Superimposed hemorrhage cannot be excluded but overall, the abnormality does not have the classic appearance of an epidural hematoma. See discussion above.  Disc space narrowing at L4-5, osseous ridging, with a far-lateral, foraminal, and extraforaminal protrusion on the LEFT. Correlate clinically for LEFT L4 and/or LEFT L5 nerve root impingement.   Electronically Signed   By: Staci Righter M.D.   On: 09/13/2016 17:09    PMFS History: Patient Active Problem List   Diagnosis Date Noted  . Acute left-sided low back pain 09/03/2016  . History of colonic polyps 07/24/2015  . HCAP (healthcare-associated pneumonia) 01/19/2015  . AKI (acute kidney injury) ([place]) 01/19/2015  . 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([place])   . Sepsis ([place]) 01/12/2015  . Hypotension 01/12/2015  . Pyrexia   . Atrial fibrillation ([place]) 07/13/2013  . Obstructive sleep apnea 07/19/2010  . PREMATURE VENTRICULAR CONTRACTIONS,  FREQUENT 01/31/2010  . 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 Hyperlipidemia 10/20/2008  . HYPERTENSION, BENIGN 10/20/2008  . CAD, NATIVE VESSEL 10/20/2008  . VENTRAL HERNIA 01/08/2008  . ERECTILE DYSFUNCTION 07/23/2007  . ACUTE PROSTATITIS 07/23/2007  . METABOLIC SYNDROME X [id number]   Past Medical History:  Diagnosis Date  . CAD (coronary artery disease)    Dr Peter Martinique  . Carotid stenosis   . Complication of anesthesia    " DIFFICULTY WAKING "  . DM (diabetes mellitus) ([place])   . ED (erectile dysfunction)   . HLD (hyperlipidemia)   . HTN (hypertension)   . Myocardial infarction 2007  . Obesity   . OSA (obstructive sleep apnea)    oral appliance, Dr Ron Parker  . Paroxysmal atrial fibrillation ([facility])     Family History  Problem Relation Age of Onset  . Diabetes Mother   . Heart attack Father      4 vessel CBAG @ 78  . Colon cancer Paternal Uncle   . Diabetes Brother   . Stroke Neg Hx     Past Surgical History:  Procedure Laterality Date  . [place]  . APPENDECTOMY  1958  . CARDIAC CATHETERIZATION  2007 & 2011  . CARDIAC CATHETERIZATION N/A 01/13/2015   Procedure: Left Heart Cath and Coronary Angiography;  Surgeon: Peter M Martinique, MD;  Location: [facility];  Service: Cardiovascular;  Laterality: N/A;  . COLONOSCOPY  2002   negative; Dr Olevia Perches  . [place]  . 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 Smoking status: Former Smoker    Packs/day: 1.00    Quit date: 09/02/1990  . Smokeless tobacco: Never Used     Comment: onset age 60-50 up to 1 ppd  . Alcohol use No  . Drug use: Unknown  . Sexual activity: Not on file

## 2016-09-17 ENCOUNTER — Encounter (HOSPITAL_COMMUNITY): Payer: Self-pay

## 2016-09-19 ENCOUNTER — Encounter (HOSPITAL_COMMUNITY): Payer: Self-pay

## 2016-09-20 ENCOUNTER — Encounter (HOSPITAL_COMMUNITY): Payer: Self-pay

## 2016-09-24 ENCOUNTER — Ambulatory Visit (INDEPENDENT_AMBULATORY_CARE_PROVIDER_SITE_OTHER): Payer: Medicare Other | Admitting: Orthopaedic Surgery

## 2016-09-24 ENCOUNTER — Encounter (HOSPITAL_COMMUNITY): Payer: Self-pay

## 2016-09-26 ENCOUNTER — Encounter (HOSPITAL_COMMUNITY): Payer: Self-pay

## 2016-09-27 ENCOUNTER — Encounter (HOSPITAL_COMMUNITY): Payer: Self-pay

## 2016-10-01 ENCOUNTER — Encounter (HOSPITAL_COMMUNITY): Payer: Self-pay

## 2016-10-03 ENCOUNTER — Encounter (HOSPITAL_COMMUNITY): Payer: Self-pay | Attending: Cardiology

## 2016-10-04 ENCOUNTER — Encounter (HOSPITAL_COMMUNITY): Payer: Self-pay

## 2016-10-08 ENCOUNTER — Encounter (HOSPITAL_COMMUNITY): Payer: Self-pay

## 2016-10-09 ENCOUNTER — Other Ambulatory Visit: Payer: Self-pay | Admitting: Cardiology

## 2016-10-10 ENCOUNTER — Encounter (HOSPITAL_COMMUNITY): Payer: Self-pay

## 2016-10-11 ENCOUNTER — Encounter (HOSPITAL_COMMUNITY): Payer: Self-pay

## 2016-10-15 ENCOUNTER — Encounter (HOSPITAL_COMMUNITY): Payer: Self-pay

## 2016-10-17 ENCOUNTER — Encounter (HOSPITAL_COMMUNITY): Payer: Self-pay

## 2016-10-18 ENCOUNTER — Encounter (HOSPITAL_COMMUNITY): Payer: Self-pay

## 2016-10-22 ENCOUNTER — Encounter (INDEPENDENT_AMBULATORY_CARE_PROVIDER_SITE_OTHER): Payer: Self-pay | Admitting: Orthopaedic Surgery

## 2016-10-22 ENCOUNTER — Encounter (HOSPITAL_COMMUNITY): Payer: Self-pay

## 2016-10-22 ENCOUNTER — Ambulatory Visit (INDEPENDENT_AMBULATORY_CARE_PROVIDER_SITE_OTHER): Payer: Medicare Other | Admitting: Orthopaedic Surgery

## 2016-10-22 VITALS — BP 155/68 | HR 64 | Ht 68.0 in | Wt 187.0 lb

## 2016-10-22 DIAGNOSIS — M5126 Other intervertebral disc displacement, lumbar region: Secondary | ICD-10-CM

## 2016-10-22 NOTE — Progress Notes (Signed)
Office Visit Note   Patient: Jacob Curry           Date of Birth: [birth year]           MRN: [id number] Visit Date: 10/22/2016              Requested by: Midge Minium, MD [address] [place], [place] [address] PCP: Annye Asa, MD   Assessment & Plan: Visit Diagnoses:  1. Lumbar herniated disc           Left L2-3 with migrated fragment 1 cm  Plan: Patient's got about 75% improvement he still has pain laterally on the left hip. He's gradually increase his activity some degree and is unhappy the still having some pain. He is however ambulatory and not using a cane.  Follow-Up Instructions: No Follow-up on file.   Orders:  No orders of the defined types were placed in this encounter.  No orders of the defined types were placed in this encounter.     Procedures: No procedures performed   Clinical Data: No additional findings.   Subjective: Chief Complaint  Patient presents with  . Lower Back - Pain, Follow-up    Patient returns for a  follow up lumbar spine. He states that he is doing better but is still not quite there. He takes ibuprofen for pain as needed. He is not using a cane today. He states that he got rid of it about 4 weeks ago.     Review of Systems  Constitutional: Negative for chills and diaphoresis.  HENT: Negative for ear discharge, ear pain and nosebleeds.   Eyes: Negative for discharge and visual disturbance.  Respiratory: Negative for cough, choking and shortness of breath.   Cardiovascular: Negative for chest pain and palpitations.  Gastrointestinal: Negative for abdominal distention and abdominal pain.  Endocrine: Negative for cold intolerance and heat intolerance.  Genitourinary: Negative for flank pain and hematuria.  Musculoskeletal: Positive for back pain.  Skin: Negative for rash and wound.  Neurological: Negative for seizures and speech difficulty.  Hematological: Negative for adenopathy. Does not bruise/bleed  easily.  Psychiatric/Behavioral: Negative for agitation and suicidal ideas.     Objective: Vital Signs: BP (!) 155/68   Pulse 64   Ht 5\' 8"  (1.727 m)   Wt 187 lb (84.8 kg)   BMI 28.43 kg/m   Physical Exam  Constitutional: He is oriented to person, place, and time. He appears well-developed and well-nourished.  HENT:  Head: Normocephalic and atraumatic.  Eyes: EOM are normal. Pupils are equal, round, and reactive to light.  Neck: No tracheal deviation present. No thyromegaly present.  Cardiovascular: Normal rate.   Pulmonary/Chest: Effort normal. He has no wheezes.  Abdominal: Soft. Bowel sounds are normal.  Musculoskeletal:  Face has negative straight leg raising. He is a mature he still has some slight list lumbar spine. He has mild sciatic notch tenderness. He's able to walk without the cane that he had previously. No pitting edema no rash or exposed skin. Patient's cough or when he sitting. Patient's been able sleep back in the bed again.  Neurological: He is alert and oriented to person, place, and time.  Skin: Skin is warm and dry. Capillary refill takes less than 2 seconds.  Psychiatric: He has a normal mood and affect. His behavior is normal. Judgment and thought content normal.    Ortho Exam  Specialty Comments:  No specialty comments available.  Imaging: No results found.   [place] [facility]  History: Patient Active Problem List   Diagnosis Date Noted  . Acute left-sided low back pain 09/03/2016  . History of colonic polyps 07/24/2015  . HCAP (healthcare-associated pneumonia) 01/19/2015  . AKI (acute kidney injury) ([facility]) 01/19/2015  . 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 Sepsis ([facility]) 01/12/2015  . Hypotension 01/12/2015  . Pyrexia   . Atrial fibrillation ([facility]) 07/13/2013  . Obstructive sleep apnea 07/19/2010  . PREMATURE VENTRICULAR CONTRACTIONS, FREQUENT 01/31/2010  . 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 Hyperlipidemia 10/20/2008  . HYPERTENSION, BENIGN  10/20/2008  . CAD, NATIVE VESSEL 10/20/2008  . VENTRAL HERNIA 01/08/2008  . ERECTILE DYSFUNCTION 07/23/2007  . ACUTE PROSTATITIS 07/23/2007  . METABOLIC SYNDROME X [id number]   Past Medical History:  Diagnosis Date  . CAD (coronary artery disease)    Dr Peter Martinique  . Carotid stenosis   . Complication of anesthesia    " DIFFICULTY WAKING "  . DM (diabetes mellitus) ([facility])   . ED (erectile dysfunction)   . HLD (hyperlipidemia)   . HTN (hypertension)   . Myocardial infarction 2007  . Obesity   . OSA (obstructive sleep apnea)    oral appliance, Dr Ron Parker  . Paroxysmal atrial fibrillation ([facility])     Family History  Problem Relation Age of Onset  . Diabetes Mother   . Heart attack Father      4 vessel CBAG @ 67  . Colon cancer Paternal Uncle   . Diabetes Brother   . Stroke Neg Hx     Past Surgical History:  Procedure Laterality Date  . [place]  . APPENDECTOMY  1958  . CARDIAC CATHETERIZATION  2007 & 2011  . CARDIAC CATHETERIZATION N/A 01/13/2015   Procedure: Left Heart Cath and Coronary Angiography;  Surgeon: Peter M Martinique, MD;  Location: [facility];  Service: Cardiovascular;  Laterality: N/A;  . COLONOSCOPY  2002   negative; Dr Olevia Perches  . [place]  . 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 Smoking status: Former Smoker    Packs/day: 1.00    Quit date: 09/02/1990  . Smokeless tobacco: Never Used     Comment: onset age [age] up to 1 ppd  . Alcohol use No  . Drug use: Unknown  . Sexual activity: Not on file

## 2016-10-24 ENCOUNTER — Encounter (HOSPITAL_COMMUNITY): Payer: Self-pay

## 2016-10-25 ENCOUNTER — Encounter (HOSPITAL_COMMUNITY): Payer: Self-pay

## 2016-10-29 ENCOUNTER — Encounter (HOSPITAL_COMMUNITY): Payer: Self-pay

## 2016-10-30 ENCOUNTER — Telehealth: Payer: Self-pay

## 2016-10-30 NOTE — Telephone Encounter (Signed)
Spoke to patient Eliquis 5 mg samples left at [facility] office front desk. 

## 2016-11-06 DIAGNOSIS — M5126 Other intervertebral disc displacement, lumbar region: Secondary | ICD-10-CM | POA: Diagnosis not present

## 2016-11-14 DIAGNOSIS — E119 Type 2 diabetes mellitus without complications: Secondary | ICD-10-CM | POA: Diagnosis not present

## 2016-11-14 DIAGNOSIS — Z961 Presence of intraocular lens: Secondary | ICD-10-CM | POA: Diagnosis not present

## 2016-11-14 LAB — HM DIABETES EYE EXAM

## 2016-11-19 ENCOUNTER — Encounter: Payer: Self-pay | Admitting: Physician Assistant

## 2016-11-19 ENCOUNTER — Ambulatory Visit (INDEPENDENT_AMBULATORY_CARE_PROVIDER_SITE_OTHER): Payer: Medicare Other | Admitting: Physician Assistant

## 2016-11-19 ENCOUNTER — Encounter: Payer: Self-pay | Admitting: General Practice

## 2016-11-19 VITALS — BP 112/72 | HR 66 | Temp 97.7°F | Resp 16 | Ht 68.0 in | Wt 201.0 lb

## 2016-11-19 DIAGNOSIS — J40 Bronchitis, not specified as acute or chronic: Secondary | ICD-10-CM | POA: Diagnosis not present

## 2016-11-19 MED ORDER — AZITHROMYCIN 250 MG PO TABS: ORAL_TABLET | ORAL | 0 refills | Status: DC

## 2016-11-19 NOTE — Progress Notes (Signed)
Patient presents to clinic today c/o nonproductive cough, wheezing, and runny nose x 5 days. Admits sinus pressure, but states "he doesn't feel like that's where the problem is." Denies fevers or chills, afebrile today. Denies ear pain, ear fullness, sore throat, SOB, chest pain, palpitations, diarrhea, constipation, hematochezia. Pt reports history of bronchitis and pneumonia. Tried Mucinex and Robitussin with minimal relief. Pt states that cough is worse at night is keeping him up. States wife as sick as well, denies recent travel. Has taken Mucinex and Robitussin for symptomatic relief with little relief.   Past Medical History:  Diagnosis Date  . CAD (coronary artery disease)    Dr Peter Martinique  . Carotid stenosis   . Complication of anesthesia    " DIFFICULTY WAKING "  . DM (diabetes mellitus) ([facility])   . ED (erectile dysfunction)   . HLD (hyperlipidemia)   . HTN (hypertension)   . Myocardial infarction 2007  . Obesity   . OSA (obstructive sleep apnea)    oral appliance, Dr Ron Parker  . Paroxysmal atrial fibrillation [facility])     Current Outpatient Prescriptions on File Prior to Visit  Medication Sig Dispense Refill  . amLODipine (NORVASC) 2.5 MG tablet TAKE 1 TABLET EVERY DAY (NEED MD APPOINTMENT) 90 tablet 3  . carvedilol (COREG) 12.5 MG tablet Take 1 tablet (12.5 mg total) by mouth 2 (two) times daily with a meal. 270 tablet 3  . ELIQUIS 5 MG TABS tablet TAKE 1 TABLET BY MOUTH 2 TIMES DAILY. 60 tablet 3  . EPINEPHrine 0.3 mg/0.3 mL IJ SOAJ injection Inject 0.3 mLs (0.3 mg total) into the muscle once. 1 Device 3  . fish oil-omega-3 fatty acids 1000 MG capsule Take 2 g by mouth daily.      Marland Kitchen losartan (COZAAR) 100 MG tablet Take 1 tablet (100 mg total) by mouth daily. 90 tablet 3  . metFORMIN (GLUCOPHAGE) 500 MG tablet Take 1 tablet (500 mg total) by mouth every morning. 90 tablet 1  . nitroGLYCERIN (NITROSTAT) 0.4 MG SL tablet Place 1 tablet (0.4 mg total) under the tongue every 5 (five)  minutes as needed. 25 tablet 11  . Saw Palmetto, Serenoa repens, (SAW PALMETTO PO) Take 1 tablet by mouth daily.      . simvastatin (ZOCOR) 40 MG tablet TAKE 1 TABLET AT BEDTIME 90 tablet 3  . traMADol (ULTRAM) 50 MG tablet Take 2 tablets (100 mg total) by mouth every 6 (six) hours as needed for moderate pain or severe pain. 30 tablet 1   No current facility-administered medications on file prior to visit.     Allergies  Allergen Reactions  . Acetaminophen Other (See Comments)    Drug induced hepatitis  . Oxycodone-Acetaminophen Other (See Comments)    Drug induced hepatitis  . Flomax [Tamsulosin Hcl] Other (See Comments)    incontinence    Family History  Problem Relation Age of Onset  . Diabetes Mother   . Heart attack Father      4 vessel CBAG @ 72  . Colon cancer Paternal Uncle   . Diabetes Brother   . Stroke Neg Hx     Social History   Social History  . Marital status: Married    Spouse name: N/A  . Number of children: N/A  . Years of education: N/A   Occupational History  . Art gallery manager / Haematologist    Social History Main Topics  . Smoking status: Former Smoker    Packs/day: 1.00    Quit  date: 09/02/1990  . Smokeless tobacco: Never Used     Comment: onset age [age] up to 1 ppd  . Alcohol use No  . Drug use: Unknown  . Sexual activity: Not Asked   Other Topics Concern  . None   Social History Narrative  . None   Review of Systems - See HPI.  All other ROS are negative.  BP 112/72   Pulse 66   Temp 97.7 F (36.5 C) (Oral)   Resp 16   Ht 5\' 8"  (1.727 m)   Wt 201 lb (91.2 kg)   SpO2 97%   BMI 30.56 kg/m   Physical Exam  Constitutional: He is oriented to person, place, and time and well-developed, well-nourished, and in no distress. No distress.  HENT:  Right Ear: External ear normal. Tympanic membrane is not erythematous. A middle ear effusion (serous) is present.  Left Ear: External ear normal. Tympanic membrane is not erythematous. A middle ear  effusion (serous) is present.  Nose: Mucosal edema and rhinorrhea present. Right sinus exhibits no maxillary sinus tenderness and no frontal sinus tenderness. Left sinus exhibits no maxillary sinus tenderness and no frontal sinus tenderness.  Mouth/Throat: Oropharynx is clear and moist. No oropharyngeal exudate.  Cardiovascular: Normal rate, regular rhythm and normal heart sounds.  Exam reveals no gallop and no friction rub.   No murmur heard. Pulmonary/Chest: Effort normal and breath sounds normal. No respiratory distress. He has no wheezes. He has no rales. He exhibits no tenderness.  Lymphadenopathy:       Head (right side): Submandibular adenopathy present. No submental, no tonsillar, no preauricular, no posterior auricular and no occipital adenopathy present.       Head (left side): Submandibular adenopathy present. No submental, no tonsillar, no preauricular, no posterior auricular and no occipital adenopathy present.    He has no cervical adenopathy.  Neurological: He is alert and oriented to person, place, and time.  Skin: Skin is warm and dry. He is not diaphoretic.  Psychiatric: Affect normal.  Vitals reviewed.  Recent Results (from the past 2160 hour(s))  HM DIABETES EYE EXAM     Status: None   Collection Time: 11/14/16 12:00 AM  Result Value Ref Range   HM Diabetic Eye Exam No Retinopathy No Retinopathy   Assessment/Plan: 1. Bronchitis Discussed viral versus bacterial etiology. Suspect viral. Patient is adamant about treatment with Azithromycin stating he always gets this as he is always given a z-pack which resolves the problem. Declines any prescription cough medication. Reviewed supportive measures with patient. Will allo Rx Azithromycin but encouraged him to give symptoms another 24-48 hours to improve before considering antibiotic.     Leeanne Rio, PA-C

## 2016-11-19 NOTE — Patient Instructions (Signed)
Take antibiotic (Azithromycin) as directed.  Increase fluids.  Get plenty of rest. Use Mucinex for congestion. Take a daily probiotic (I recommend Align or Culturelle, but even Activia Yogurt may be beneficial).  A humidifier placed in the bedroom may offer some relief for a dry, scratchy throat of nasal irritation.  Read information below on acute bronchitis. Please call or return to clinic if symptoms are not improving.  Acute Bronchitis Bronchitis is when the airways that extend from the windpipe into the lungs get red, puffy, and painful (inflamed). Bronchitis often causes thick spit (mucus) to develop. This leads to a cough. A cough is the most common symptom of bronchitis. In acute bronchitis, the condition usually begins suddenly and goes away over time (usually in 2 weeks). Smoking, allergies, and asthma can make bronchitis worse. Repeated episodes of bronchitis may cause more lung problems.  HOME CARE  Rest.  Drink enough fluids to keep your pee (urine) clear or pale yellow (unless you need to limit fluids as told by your doctor).  Only take over-the-counter or prescription medicines as told by your doctor.  Avoid smoking and secondhand smoke. These can make bronchitis worse. If you are a smoker, think about using nicotine gum or skin patches. Quitting smoking will help your lungs heal faster.  Reduce the chance of getting bronchitis again by:  Washing your hands often.  Avoiding people with cold symptoms.  Trying not to touch your hands to your mouth, nose, or eyes.  Follow up with your doctor as told.  GET HELP IF: Your symptoms do not improve after 1 week of treatment. Symptoms include:  Cough.  Fever.  Coughing up thick spit.  Body aches.  Chest congestion.  Chills.  Shortness of breath.  Sore throat.  GET HELP RIGHT AWAY IF:   You have an increased fever.  You have chills.  You have severe shortness of breath.  You have bloody thick spit  (sputum).  You throw up (vomit) often.  You lose too much body fluid (dehydration).  You have a severe headache.  You faint.  MAKE SURE YOU:   Understand these instructions.  Will watch your condition.  Will get help right away if you are not doing well or get worse. Document Released: 02/05/2008 Document Revised: 04/21/2013 Document Reviewed: 02/09/2013 ExitCare Patient Information 2015 [employer]. This information is not intended to replace advice given to you by your health care provider. Make sure you discuss any questions you have with your health care provider.   

## 2016-11-19 NOTE — Progress Notes (Signed)
Pre visit review using our clinic review tool, if applicable. No additional management support is needed unless otherwise documented below in the visit note. 

## 2016-11-20 ENCOUNTER — Ambulatory Visit (INDEPENDENT_AMBULATORY_CARE_PROVIDER_SITE_OTHER): Payer: Medicare Other | Admitting: Orthopaedic Surgery

## 2016-11-26 ENCOUNTER — Ambulatory Visit (INDEPENDENT_AMBULATORY_CARE_PROVIDER_SITE_OTHER): Payer: Medicare Other | Admitting: Family Medicine

## 2016-11-26 ENCOUNTER — Encounter: Payer: Self-pay | Admitting: Family Medicine

## 2016-11-26 ENCOUNTER — Ambulatory Visit (INDEPENDENT_AMBULATORY_CARE_PROVIDER_SITE_OTHER): Payer: Medicare Other

## 2016-11-26 VITALS — BP 138/82 | HR 67 | Temp 97.9°F | Ht 67.5 in | Wt 198.4 lb

## 2016-11-26 DIAGNOSIS — R05 Cough: Secondary | ICD-10-CM

## 2016-11-26 DIAGNOSIS — R059 Cough, unspecified: Secondary | ICD-10-CM

## 2016-11-26 DIAGNOSIS — J9811 Atelectasis: Secondary | ICD-10-CM | POA: Diagnosis not present

## 2016-11-26 MED ORDER — BECLOMETHASONE DIPROPIONATE 80 MCG/ACT IN AERS: 1.0000 | INHALATION_SPRAY | Freq: Two times a day (BID) | RESPIRATORY_TRACT | 1 refills | Status: DC

## 2016-11-26 MED ORDER — GUAIFENESIN-CODEINE 100-10 MG/5ML PO SYRP: 10.0000 mL | ORAL_SOLUTION | Freq: Every evening | ORAL | 0 refills | Status: DC | PRN

## 2016-11-26 MED ORDER — DOXYCYCLINE HYCLATE 100 MG PO TABS: 100.0000 mg | ORAL_TABLET | Freq: Two times a day (BID) | ORAL | 0 refills | Status: DC

## 2016-11-26 NOTE — Progress Notes (Signed)
Pre visit review using our clinic review tool, if applicable. No additional management support is needed unless otherwise documented below in the visit note. 

## 2016-11-26 NOTE — Progress Notes (Signed)
Jacob Curry is a 76 y.o. male here for a worsening of a previous issue.Marland Kitchen  History of Present Illness:   Water quality scientist, CMA, acting as scribe for Dr. Juleen China.  Chief Complaint  Patient presents with  . Cough   Cough  This is a new problem. The current episode started 1 to 4 weeks ago. The problem has been gradually worsening. The problem occurs every few minutes. Associated symptoms include headaches. Pertinent negatives include no chest pain, chills, ear pain, fever, rash, sore throat or shortness of breath. The symptoms are aggravated by lying down. He has tried OTC cough suppressant for the symptoms. The treatment provided no relief.   PMHx, SurgHx, SocialHx, Medications, and Allergies were reviewed in the Visit Navigator and updated as appropriate.  Current Medications:   Current Outpatient Prescriptions:  .  amLODipine (NORVASC) 2.5 MG tablet, TAKE 1 TABLET EVERY DAY (NEED MD APPOINTMENT), Disp: 90 tablet, Rfl: 3 .  carvedilol (COREG) 12.5 MG tablet, Take 1 tablet (12.5 mg total) by mouth 2 (two) times daily with a meal., Disp: 270 tablet, Rfl: 3 .  ELIQUIS 5 MG TABS tablet, TAKE 1 TABLET BY MOUTH 2 TIMES DAILY., Disp: 60 tablet, Rfl: 3 .  EPINEPHrine 0.3 mg/0.3 mL IJ SOAJ injection, Inject 0.3 mLs (0.3 mg total) into the muscle once., Disp: 1 Device, Rfl: 3 .  fish oil-omega-3 fatty acids 1000 MG capsule, Take 2 g by mouth daily.  , Disp: , Rfl:  .  losartan (COZAAR) 100 MG tablet, Take 1 tablet (100 mg total) by mouth daily., Disp: 90 tablet, Rfl: 3 .  metFORMIN (GLUCOPHAGE) 500 MG tablet, Take 1 tablet (500 mg total) by mouth every morning., Disp: 90 tablet, Rfl: 1 .  nitroGLYCERIN (NITROSTAT) 0.4 MG SL tablet, Place 1 tablet (0.4 mg total) under the tongue every 5 (five) minutes as needed., Disp: 25 tablet, Rfl: 11 .  Saw Palmetto, Serenoa repens, (SAW PALMETTO PO), Take 1 tablet by mouth daily.  , Disp: , Rfl:  .  simvastatin (ZOCOR) 40 MG tablet, TAKE 1 TABLET AT BEDTIME,  Disp: 90 tablet, Rfl: 3 .  traMADol (ULTRAM) 50 MG tablet, Take 2 tablets (100 mg total) by mouth every 6 (six) hours as needed for moderate pain or severe pain., Disp: 30 tablet, Rfl: 1   Review of Systems:   Review of Systems  Constitutional: Negative for chills and fever.  HENT: Positive for congestion. Negative for ear pain and sore throat.   Eyes: Negative for blurred vision.  Respiratory: Positive for cough. Negative for shortness of breath.   Cardiovascular: Negative for chest pain and palpitations.  Gastrointestinal: Negative for abdominal pain, nausea and vomiting.  Genitourinary: Negative for frequency.  Musculoskeletal: Positive for back pain.  Skin: Negative for rash.  Neurological: Positive for headaches. Negative for loss of consciousness.    Vitals:   Vitals:   11/26/16 0954  BP: 138/82  Pulse: 67  Temp: 97.9 F (36.6 C)  TempSrc: Oral  SpO2: 96%  Weight: 198 lb 6.4 oz (90 kg)  Height: 5' 7.5" (1.715 m)     Body mass index is 30.62 kg/m.  Physical Exam:   Physical Exam  Constitutional: He is oriented to person, place, and time. He appears well-developed and well-nourished. No distress.  HENT:  Head: Normocephalic and atraumatic.  Right Ear: External ear normal.  Left Ear: External ear normal.  Nose: Nose normal.  Mouth/Throat: Oropharynx is clear and moist.  Eyes: Conjunctivae and EOM are normal. Pupils  are equal, round, and reactive to light.  Neck: Normal range of motion. Neck supple.  Cardiovascular: Normal rate, normal heart sounds and intact distal pulses.   Pulmonary/Chest: Effort normal. He has wheezes. He has rhonchi in the right middle field.  Abdominal: Soft. Bowel sounds are normal.  Musculoskeletal: Normal range of motion.  Neurological: He is alert and oriented to person, place, and time.  Skin: Skin is warm and dry.  Psychiatric: He has a normal mood and affect. His behavior is normal. Judgment and thought content normal.  Nursing  note and vitals reviewed.   Assessment and Plan:    [name] was seen today for acute visit and cough.  Diagnoses and all orders for this visit:  Cough Comments: CXR not impressive, but still concern for early PNA RML, LLL. Will go ahead and treat based on history and clinical picture today. Orders: -     DG Chest 2 View -     guaiFENesin-codeine (CHERATUSSIN AC) 100-10 MG/5ML syrup; Take 10 mLs by mouth at bedtime as needed for cough or congestion. -     beclomethasone (QVAR) 80 MCG/ACT inhaler; Inhale 1 puff into the lungs 2 (two) times daily. -     doxycycline (VIBRA-TABS) 100 MG tablet; Take 1 tablet (100 mg total) by mouth 2 (two) times daily.   . Reviewed expectations re: course of current medical issues. . Discussed self-management of symptoms. . Outlined signs and symptoms indicating need for more acute intervention. . Patient verbalized understanding and all questions were answered. . See orders for this visit as documented in the electronic medical record. . Patient received an After-Visit Summary.   CMA served as Education administrator during this visit. History, Physical, and Plan performed by medical provider. Documentation and orders reviewed and attested to. Briscoe Deutscher, D.O.  Briscoe Deutscher, D.O.

## 2016-11-27 ENCOUNTER — Other Ambulatory Visit: Payer: Self-pay

## 2016-11-27 MED ORDER — FLUTICASONE PROPIONATE (INHAL) 50 MCG/BLIST IN AEPB
1.0000 | INHALATION_SPRAY | Freq: Two times a day (BID) | RESPIRATORY_TRACT | 1 refills | Status: DC
Start: 2016-11-27 — End: 2017-02-03

## 2016-12-30 ENCOUNTER — Other Ambulatory Visit: Payer: Self-pay | Admitting: Family Medicine

## 2017-01-07 ENCOUNTER — Telehealth: Payer: Self-pay | Admitting: Family Medicine

## 2017-01-07 MED ORDER — METFORMIN HCL 500 MG PO TABS: 500.0000 mg | ORAL_TABLET | Freq: Every morning | ORAL | 0 refills | Status: DC

## 2017-01-07 NOTE — Telephone Encounter (Signed)
Pt calling asking about his refill for metformin and why it was denied his refill, I let pt know that he was past due for an appt and he stated that he has his AWV coming up on 6/1 and asking if the refill could be called into [facility].

## 2017-01-07 NOTE — Telephone Encounter (Signed)
Medication filled to pharmacy as requested.   

## 2017-01-07 NOTE — Telephone Encounter (Signed)
Please advise? Pt is diabetic and has not been seen since June 2017

## 2017-01-07 NOTE — Telephone Encounter (Signed)
[place] for refill to [facility] for 90 days, no refills but he must keep appts

## 2017-01-09 ENCOUNTER — Encounter: Payer: Self-pay | Admitting: *Deleted

## 2017-01-15 ENCOUNTER — Other Ambulatory Visit: Payer: Self-pay | Admitting: Pharmacist

## 2017-01-15 MED ORDER — APIXABAN 5 MG PO TABS
5.0000 mg | ORAL_TABLET | Freq: Two times a day (BID) | ORAL | 0 refills | Status: DC
Start: 2017-01-15 — End: 2017-01-17

## 2017-01-17 ENCOUNTER — Other Ambulatory Visit: Payer: Self-pay | Admitting: Pharmacist Clinician (PhC)/ Clinical Pharmacy Specialist

## 2017-01-17 MED ORDER — APIXABAN 5 MG PO TABS: 5.0000 mg | ORAL_TABLET | Freq: Two times a day (BID) | ORAL | 1 refills | Status: DC

## 2017-01-31 ENCOUNTER — Encounter: Payer: Self-pay | Admitting: Family Medicine

## 2017-01-31 ENCOUNTER — Ambulatory Visit (INDEPENDENT_AMBULATORY_CARE_PROVIDER_SITE_OTHER): Payer: Medicare Other | Admitting: Family Medicine

## 2017-01-31 VITALS — BP 133/80 | HR 62 | Temp 98.0°F | Resp 17 | Ht 68.0 in | Wt 198.4 lb

## 2017-01-31 DIAGNOSIS — I480 Paroxysmal atrial fibrillation: Secondary | ICD-10-CM

## 2017-01-31 DIAGNOSIS — E1159 Type 2 diabetes mellitus with other circulatory complications: Secondary | ICD-10-CM

## 2017-01-31 DIAGNOSIS — E7800 Pure hypercholesterolemia, unspecified: Secondary | ICD-10-CM

## 2017-01-31 DIAGNOSIS — Z0000 Encounter for general adult medical examination without abnormal findings: Secondary | ICD-10-CM

## 2017-01-31 DIAGNOSIS — I10 Essential (primary) hypertension: Secondary | ICD-10-CM

## 2017-01-31 LAB — BASIC METABOLIC PANEL
BUN: 15 mg/dL (ref 6–23)
CO2: 30 mEq/L (ref 19–32)
Calcium: 9.4 mg/dL (ref 8.4–10.5)
Chloride: 107 mEq/L (ref 96–112)
Creatinine, Ser: 0.99 mg/dL (ref 0.40–1.50)
GFR: 78.1 mL/min (ref >60.00)
Glucose, Bld: 115 mg/dL — ABNORMAL HIGH (ref 70–99)
Potassium: 4.5 mEq/L (ref 3.5–5.1)
Sodium: 141 mEq/L (ref 135–145)

## 2017-01-31 LAB — CBC WITH DIFFERENTIAL/PLATELET
Basophils Absolute: 0.1 10*3/uL (ref 0.0–0.1)
Basophils Relative: 0.8 % (ref 0.0–3.0)
Eosinophils Absolute: 0.2 10*3/uL (ref 0.0–0.7)
Eosinophils Relative: 1.7 % (ref 0.0–5.0)
HCT: 42.8 % (ref 39.0–52.0)
Hemoglobin: 14.2 g/dL (ref 13.0–17.0)
Lymphocytes Relative: 25.4 % (ref 12.0–46.0)
Lymphs Abs: 2.5 10*3/uL (ref 0.7–4.0)
MCHC: 33.2 g/dL (ref 30.0–36.0)
MCV: 89.5 fl (ref 78.0–100.0)
Monocytes Absolute: 1.1 10*3/uL — ABNORMAL HIGH (ref 0.1–1.0)
Monocytes Relative: 10.7 % (ref 3.0–12.0)
Neutro Abs: 6.1 10*3/uL (ref 1.4–7.7)
Neutrophils Relative %: 61.4 % (ref 43.0–77.0)
Platelets: 215 10*3/uL (ref 150.0–400.0)
RBC: 4.78 Mil/uL (ref 4.22–5.81)
RDW: 14.4 % (ref 11.5–15.5)
WBC: 9.9 10*3/uL (ref 4.0–10.5)

## 2017-01-31 LAB — HEMOGLOBIN A1C: Hgb A1c MFr Bld: 6.5 % (ref 4.6–6.5)

## 2017-01-31 LAB — HEPATIC FUNCTION PANEL
ALT: 23 U/L (ref 0–53)
AST: 21 U/L (ref 0–37)
Albumin: 4.6 g/dL (ref 3.5–5.2)
Alkaline Phosphatase: 73 U/L (ref 39–117)
Bilirubin, Direct: 0.2 mg/dL (ref 0.0–0.3)
Total Bilirubin: 0.7 mg/dL (ref 0.2–1.2)
Total Protein: 7 g/dL (ref 6.0–8.3)

## 2017-01-31 LAB — LIPID PANEL
Cholesterol: 111 mg/dL (ref 0–200)
HDL: 47.2 mg/dL (ref >39.00)
NonHDL: 63.75
Total CHOL/HDL Ratio: 2
Triglycerides: 222 mg/dL — ABNORMAL HIGH (ref 0.0–149.0)
VLDL: 44.4 mg/dL — ABNORMAL HIGH (ref 0.0–40.0)

## 2017-01-31 LAB — TSH: TSH: 2.41 u[IU]/mL (ref 0.35–4.50)

## 2017-01-31 LAB — LDL CHOLESTEROL, DIRECT: Direct LDL: 41 mg/dL

## 2017-01-31 NOTE — Progress Notes (Signed)
[name] Date of Birth: 1941-01-21 Medical Record #[id number]  History of Present Illness: Jacob Curry is seen for follow up CAD. He has a known history of coronary disease with cardiac catheterization in November 2007 showing total occlusion of a large left circumflex vessel with good left to left collaterals. There was 40% disease in the left main and origin of LAD. 90% mid diagonal, 80-90% mid RCA and 90% RV marginal branch.  Ejection fraction was 50-55%. Prior to this he had a nuclear stress test- he was able to exercise 8:30 with severe ischemia of the inferolateral wall. He has been managed medically. Repeat cardiac cath in May 2011 and May 2016 showed no significant change. He has a history of hypertension, hyperlipidemia, Pafib,  diabetes. He has a history of moderate obstructive sleep apnea. This is managed with an oral appliance. He was unable to tolerate CPAP therapy.  He is anticoagulated with Eliquis.   In February he was evaluated by Ortho for left hip pain. 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 Managed conservatively with improvement.  On follow up today he is doing well.  He does report that yesterday he just felt bad and his HR was elevated at 84. feels better today. Denies any chest pain, SOB, edema. He has been last active and wants to resume maintenance  cardiac Rehab.   Current Outpatient Prescriptions on File Prior to Visit  Medication Sig Dispense Refill  . amLODipine (NORVASC) 2.5 MG tablet TAKE 1 TABLET EVERY DAY (NEED MD APPOINTMENT) 90 tablet 3  . apixaban (ELIQUIS) 5 MG TABS tablet Take 1 tablet (5 mg total) by mouth 2 (two) times daily. 180 tablet 1  . carvedilol (COREG) 12.5 MG tablet Take 1 tablet (12.5 mg total) by mouth 2 (two) times daily with a meal. 270 tablet 3  . EPINEPHrine 0.3 mg/0.3 mL IJ SOAJ injection Inject 0.3 mLs (0.3 mg total) into the muscle once. 1 Device 3  . fish oil-omega-3 fatty acids 1000 MG capsule Take 2 g by mouth daily.      Marland Kitchen losartan  (COZAAR) 100 MG tablet Take 1 tablet (100 mg total) by mouth daily. 90 tablet 3  . metFORMIN (GLUCOPHAGE) 500 MG tablet Take 1 tablet (500 mg total) by mouth every morning. 90 tablet 0  . nitroGLYCERIN (NITROSTAT) 0.4 MG SL tablet Place 1 tablet (0.4 mg total) under the tongue every 5 (five) minutes as needed. 25 tablet 11  . Saw Palmetto, Serenoa repens, (SAW PALMETTO PO) Take 1 tablet by mouth daily.      . simvastatin (ZOCOR) 40 MG tablet TAKE 1 TABLET AT BEDTIME 90 tablet 3  . traMADol (ULTRAM) 50 MG tablet Take 2 tablets (100 mg total) by mouth every 6 (six) hours as needed for moderate pain or severe pain. 30 tablet 1   No current facility-administered medications on file prior to visit.     Allergies  Allergen Reactions  . Acetaminophen Other (See Comments)    Drug induced hepatitis  . Oxycodone-Acetaminophen Other (See Comments)    Drug induced hepatitis  . Flomax [Tamsulosin Hcl] Other (See Comments)    incontinence    Past Medical History:  Diagnosis Date  . CAD (coronary artery disease)    Dr Peter Martinique  . Carotid stenosis   . Complication of anesthesia    " DIFFICULTY WAKING "  . DM (diabetes mellitus) ([place])   . ED (erectile dysfunction)   . HLD (hyperlipidemia)   . HTN (hypertension)   .  Myocardial infarction ([place]) 2007  . Obesity   . OSA (obstructive sleep apnea)    oral appliance, Dr Ron Parker  . Paroxysmal atrial fibrillation [facility])     Past Surgical History:  Procedure Laterality Date  . [place]  . APPENDECTOMY  1958  . CARDIAC CATHETERIZATION  2007 & 2011  . CARDIAC CATHETERIZATION N/A 01/13/2015   Procedure: Left Heart Cath and Coronary Angiography;  Surgeon: Peter M Martinique, MD;  Location: [facility];  Service: Cardiovascular;  Laterality: N/A;  . COLONOSCOPY  2002   negative; Dr Olevia Perches  . [place]  . TONSILLECTOMY      History  Smoking Status  . Former Smoker  . Packs/day: 1.00  . Quit date: 09/02/1990   Smokeless Tobacco  . Never Used    Comment: onset age [age] up to 1 ppd    History  Alcohol Use No    Family History  Problem Relation Age of Onset  . Diabetes Mother   . Heart attack Father         4 vessel CBAG @ 76  . Colon cancer Paternal Uncle   . Diabetes Brother   . Stroke Neg Hx     Review of Systems: As noted in history of present illness.  All other systems were reviewed and are negative.  Physical Exam: BP (!) 148/64   Pulse 72   Ht 5\' 8"  (1.727 m)   Wt 196 lb 6.4 oz (89.1 kg)   BMI 29.86 kg/m  He is a pleasant white male in no acute distress. HEENT exam is unremarkable. He has no jugular venous distention or bruits. There is no adenopathy or thyromegaly. Cardiac exam reveals a regular rate and rhythm. There is no  gallop, murmur, or click. PMI is normal. Lungs are clear. Abdomen is soft and nontender without masses or bruits. He has normal bowel sounds. Extremities are without cyanosis or edema. Pedal pulses are 2+ and symmetric. He is alert and oriented x3. Cranial nerves II through XII are intact. He has no focal findings.  LABORATORY DATA: Lab Results  Component Value Date   WBC 9.9 01/31/2017   HGB 14.2 01/31/2017   HCT 42.8 01/31/2017   PLT 215.0 01/31/2017   GLUCOSE 115 (H) 01/31/2017   CHOL 111 01/31/2017   TRIG 222.0 (H) 01/31/2017   HDL 47.20 01/31/2017   LDLDIRECT 41.0 01/31/2017   LDLCALC 43 02/05/2016   ALT 23 01/31/2017   AST 21 01/31/2017   NA 141 01/31/2017   K 4.5 01/31/2017   CL 107 01/31/2017   CREATININE 0.99 01/31/2017   BUN 15 01/31/2017   CO2 30 01/31/2017   TSH 2.41 01/31/2017   PSA 1.70 01/04/2009   INR 1.75 (H) 01/12/2015   HGBA1C 6.5 01/31/2017   MICROALBUR 0.4 03/09/2012   Ecg today shows NSR with occ. PVC. Otherwise normal. I have personally reviewed and interpreted this study.   Assessment / Plan: 1. 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 Moderate to severe 3 vessel disease. Patient  is asymptomatic on medical therapy. Cardiac cath  in May 2016 unchanged from 2007 and 2011.  Will continue medical therapy and monitor for any new symptoms.  He is on optimal medical therapy.  2. Hypertension, mildly elevated today but has been controlled. Just took his meds today.  3. Hyperlipidemia well controlled on medication.    4. Atrial fibrillation, paroxysmal. Patient is in sinus rhythm today. I suspect  he may have had Afib yesterday given his symptoms and increased HR.  His rate was controlled on beta blocker therapy.  He does have a high Chadvasc score of 4. Continue anticoagulation with Eliquis 5 mg twice a day.   5. DM type 2. On metformin- followed by primary care. Focus on avoiding sweets and weight loss.   I will follow up in 6 months.

## 2017-01-31 NOTE — Assessment & Plan Note (Signed)
Chronic problem.  Following w/ Dr Martinique.  On beta blocker and anticoaguation.  Currently asymptomatic.  Will follow along.

## 2017-01-31 NOTE — Assessment & Plan Note (Signed)
Chronic problem.  Tolerating statin w/o difficulty.  Check labs.  Adjust meds prn  

## 2017-01-31 NOTE — Assessment & Plan Note (Signed)
Chronic problem.  Tolerating Metformin w/o difficulty.  UTD on eye exam.  Foot exam done today.  On ARB for renal protection.  Stressed the need for healthy diet and regular exercise.  Check labs.  Adjust meds prn

## 2017-01-31 NOTE — Progress Notes (Signed)
Pre visit review using our clinic review tool, if applicable. No additional management support is needed unless otherwise documented below in the visit note. 

## 2017-01-31 NOTE — Progress Notes (Addendum)
   Subjective:    Patient ID: Jacob Curry, male    DOB: 1940-11-02, 76 y.o.   MRN: [id number]  HPI Here today for MWV and f/u on chronic conditions.  Risk Factors: DM- chronic problem, on Metformin daily.  On ARB for renal protection.  UTD on eye exam, due for foot exam.  Denies symptomatic lows.  No numbness/tingling of hands/feet. Hyperlipidemia- chronic problem, on Simvastatin and fish oil.  Denies abd pain, N/V, myalgias. HTN- chronic problem, on Amlodipine, Coreg, and Losartan w/ adequate control.  Denies CP, SOB, HAs, visual changes, edema. Afib- chronic problem, following w/ Dr Martinique.  On Eliquis for anticoagulation, Coreg for rate control.  No palpitations, SOB. Physical Activity: no regular activity Fall Risk: low Depression: denies current sxs Hearing: normal to conversational tones, mildly decreased to whispered ADL's: independent Cognitive: normal linear thought process, memory and attention intact Home Safety: safe at home Height, Weight, BMI, Visual Acuity: see vitals, vision corrected to 20/20 w/ cataract surgery Counseling: UTD on colonoscopy (done 2016), UTD on eye exam, UTD w/ urology Care team reviewed and updated w/ pt. Labs Ordered: See A&P Care Plan: See A&P   PMH, PSH, social hx, family hx, and problem list reviewed   Review of Systems Patient reports no vision/hearing changes, anorexia, fever ,adenopathy, persistant/recurrent hoarseness, swallowing issues, chest pain, palpitations, edema, persistant/recurrent cough, hemoptysis, dyspnea (rest,exertional, paroxysmal nocturnal), gastrointestinal  bleeding (melena, rectal bleeding), abdominal pain, excessive heart burn, GU symptoms (dysuria, hematuria, voiding/incontinence issues) syncope, focal weakness, memory loss, numbness & tingling, skin/hair/nail changes, depression, anxiety, abnormal bruising/bleeding, musculoskeletal symptoms/signs.     Objective:   Physical Exam General Appearance:    Alert,  cooperative, no distress, appears stated age, obese  Head:    Normocephalic, without obvious abnormality, atraumatic  Eyes:    PERRL, conjunctiva/corneas clear, EOM's intact, fundi    benign, both eyes       Ears:    Normal TM's and external ear canals, both ears  Nose:   Nares normal, septum midline, mucosa normal, no drainage   or sinus tenderness  Throat:   Lips, mucosa, and tongue normal; teeth and gums normal  Neck:   Supple, symmetrical, trachea midline, no adenopathy;       thyroid:  No enlargement/tenderness/nodules  Back:     Symmetric, no curvature, ROM normal, no CVA tenderness  Lungs:     Clear to auscultation bilaterally, respirations unlabored  Chest wall:    No tenderness or deformity  Heart:    Regular rate and rhythm, S1 and S2 normal, no murmur, rub   or gallop  Abdomen:     Soft, non-tender, bowel sounds active all four quadrants,    no masses, no organomegaly  Genitalia:    Deferred to urology  Rectal:    Extremities:   Extremities normal, atraumatic, no cyanosis or edema  Pulses:   2+ and symmetric all extremities  Skin:   Skin color, texture, turgor normal, no rashes or lesions  Lymph nodes:   Cervical, supraclavicular, and axillary nodes normal  Neurologic:   CNII-XII intact. Normal strength, sensation and reflexes      throughout          Assessment & Plan:

## 2017-01-31 NOTE — Patient Instructions (Signed)
Follow up in 3-4 months to recheck diabetes We'll notify you of your lab results and send them to Dr Martinique for review Continue to work on [employer] and regular exercise- you can do it! You are up to date on colonoscopy- yay!!! You are up to date on vaccines- yay! Call with any questions or concerns Have a great weekend!!

## 2017-01-31 NOTE — Assessment & Plan Note (Signed)
Pt's PE WNL w/ exception of obesity.  Reviewed screenings- UTD on colonoscopy, urology, immunizations.  Care team reviewed and updated w/ pt.  No current depression and low fall risk.  Anticipatory guidance provided.

## 2017-01-31 NOTE — Assessment & Plan Note (Signed)
Chronic problem.  Adequate control today.  Asymptomatic.  Check labs.  No anticipated med changes.  Will follow. 

## 2017-02-03 ENCOUNTER — Encounter: Payer: Self-pay | Admitting: Cardiology

## 2017-02-03 ENCOUNTER — Ambulatory Visit (INDEPENDENT_AMBULATORY_CARE_PROVIDER_SITE_OTHER): Payer: Medicare Other | Admitting: Cardiology

## 2017-02-03 ENCOUNTER — Other Ambulatory Visit: Payer: Self-pay

## 2017-02-03 VITALS — BP 148/64 | HR 72 | Ht 68.0 in | Wt 196.4 lb

## 2017-02-03 DIAGNOSIS — E782 Mixed hyperlipidemia: Secondary | ICD-10-CM

## 2017-02-03 DIAGNOSIS — I2510 Atherosclerotic heart disease of native coronary artery without angina pectoris: Secondary | ICD-10-CM | POA: Diagnosis not present

## 2017-02-03 DIAGNOSIS — I480 Paroxysmal atrial fibrillation: Secondary | ICD-10-CM | POA: Diagnosis not present

## 2017-02-03 DIAGNOSIS — I10 Essential (primary) hypertension: Secondary | ICD-10-CM | POA: Diagnosis not present

## 2017-02-03 NOTE — Patient Instructions (Signed)
Continue your current therapy  I will see you in 6 months.   

## 2017-02-04 ENCOUNTER — Other Ambulatory Visit: Payer: Self-pay

## 2017-02-04 DIAGNOSIS — I2510 Atherosclerotic heart disease of native coronary artery without angina pectoris: Secondary | ICD-10-CM

## 2017-02-05 DIAGNOSIS — N390 Urinary tract infection, site not specified: Secondary | ICD-10-CM | POA: Diagnosis not present

## 2017-02-25 ENCOUNTER — Encounter (HOSPITAL_COMMUNITY)
Admission: RE | Admit: 2017-02-25 | Discharge: 2017-02-25 | Disposition: A | Discharge status: Home / Self Care | Payer: Self-pay | Source: Ambulatory Visit | Attending: Cardiology | Admitting: Cardiology

## 2017-02-25 DIAGNOSIS — E785 Hyperlipidemia, unspecified: Secondary | ICD-10-CM | POA: Insufficient documentation

## 2017-02-25 DIAGNOSIS — E119 Type 2 diabetes mellitus without complications: Secondary | ICD-10-CM | POA: Insufficient documentation

## 2017-02-25 DIAGNOSIS — I208 Other forms of angina pectoris: Secondary | ICD-10-CM | POA: Insufficient documentation

## 2017-02-27 ENCOUNTER — Encounter (HOSPITAL_COMMUNITY)
Admission: RE | Admit: 2017-02-27 | Discharge: 2017-02-27 | Disposition: A | Discharge status: Home / Self Care | Payer: Self-pay | Source: Ambulatory Visit | Attending: Cardiology | Admitting: Cardiology

## 2017-02-28 ENCOUNTER — Encounter (HOSPITAL_COMMUNITY)
Admission: RE | Admit: 2017-02-28 | Discharge: 2017-02-28 | Disposition: A | Discharge status: Home / Self Care | Payer: Self-pay | Source: Ambulatory Visit | Attending: Cardiology | Admitting: Cardiology

## 2017-03-04 ENCOUNTER — Encounter (HOSPITAL_COMMUNITY)
Admission: RE | Admit: 2017-03-04 | Discharge: 2017-03-04 | Disposition: A | Discharge status: Home / Self Care | Payer: Self-pay | Source: Ambulatory Visit | Attending: Cardiology | Admitting: Cardiology

## 2017-03-04 DIAGNOSIS — E119 Type 2 diabetes mellitus without complications: Secondary | ICD-10-CM | POA: Insufficient documentation

## 2017-03-04 DIAGNOSIS — E785 Hyperlipidemia, unspecified: Secondary | ICD-10-CM | POA: Insufficient documentation

## 2017-03-04 DIAGNOSIS — I208 Other forms of angina pectoris: Secondary | ICD-10-CM | POA: Insufficient documentation

## 2017-03-06 ENCOUNTER — Encounter (HOSPITAL_COMMUNITY)
Admission: RE | Admit: 2017-03-06 | Discharge: 2017-03-06 | Disposition: A | Discharge status: Home / Self Care | Payer: Self-pay | Source: Ambulatory Visit | Attending: Cardiology | Admitting: Cardiology

## 2017-03-07 ENCOUNTER — Encounter (HOSPITAL_COMMUNITY)
Admission: RE | Admit: 2017-03-07 | Discharge: 2017-03-07 | Disposition: A | Discharge status: Home / Self Care | Payer: Self-pay | Source: Ambulatory Visit | Attending: Cardiology | Admitting: Cardiology

## 2017-03-11 ENCOUNTER — Encounter (HOSPITAL_COMMUNITY)
Admission: RE | Admit: 2017-03-11 | Discharge: 2017-03-11 | Disposition: A | Discharge status: Home / Self Care | Payer: Self-pay | Source: Ambulatory Visit | Attending: Cardiology | Admitting: Cardiology

## 2017-03-13 ENCOUNTER — Encounter (HOSPITAL_COMMUNITY)
Admission: RE | Admit: 2017-03-13 | Discharge: 2017-03-13 | Disposition: A | Discharge status: Home / Self Care | Payer: Self-pay | Source: Ambulatory Visit | Attending: Cardiology | Admitting: Cardiology

## 2017-03-14 ENCOUNTER — Encounter (HOSPITAL_COMMUNITY)
Admission: RE | Admit: 2017-03-14 | Discharge: 2017-03-14 | Disposition: A | Discharge status: Home / Self Care | Payer: Self-pay | Source: Ambulatory Visit | Attending: Cardiology | Admitting: Cardiology

## 2017-03-18 ENCOUNTER — Encounter (HOSPITAL_COMMUNITY)
Admission: RE | Admit: 2017-03-18 | Discharge: 2017-03-18 | Disposition: A | Discharge status: Home / Self Care | Payer: Self-pay | Source: Ambulatory Visit | Attending: Cardiology | Admitting: Cardiology

## 2017-03-19 DIAGNOSIS — L570 Actinic keratosis: Secondary | ICD-10-CM | POA: Diagnosis not present

## 2017-03-19 DIAGNOSIS — L814 Other melanin hyperpigmentation: Secondary | ICD-10-CM | POA: Diagnosis not present

## 2017-03-19 DIAGNOSIS — L821 Other seborrheic keratosis: Secondary | ICD-10-CM | POA: Diagnosis not present

## 2017-03-19 DIAGNOSIS — D225 Melanocytic nevi of trunk: Secondary | ICD-10-CM | POA: Diagnosis not present

## 2017-03-20 ENCOUNTER — Encounter (HOSPITAL_COMMUNITY)
Admission: RE | Admit: 2017-03-20 | Discharge: 2017-03-20 | Disposition: A | Discharge status: Home / Self Care | Payer: Self-pay | Source: Ambulatory Visit | Attending: Cardiology | Admitting: Cardiology

## 2017-03-21 ENCOUNTER — Encounter (HOSPITAL_COMMUNITY)
Admission: RE | Admit: 2017-03-21 | Discharge: 2017-03-21 | Disposition: A | Discharge status: Home / Self Care | Payer: Self-pay | Source: Ambulatory Visit | Attending: Cardiology | Admitting: Cardiology

## 2017-03-25 ENCOUNTER — Encounter (HOSPITAL_COMMUNITY)
Admission: RE | Admit: 2017-03-25 | Discharge: 2017-03-25 | Disposition: A | Discharge status: Home / Self Care | Payer: Self-pay | Source: Ambulatory Visit | Attending: Cardiology | Admitting: Cardiology

## 2017-03-27 ENCOUNTER — Encounter (HOSPITAL_COMMUNITY)
Admission: RE | Admit: 2017-03-27 | Discharge: 2017-03-27 | Disposition: A | Discharge status: Home / Self Care | Payer: Self-pay | Source: Ambulatory Visit | Attending: Cardiology | Admitting: Cardiology

## 2017-03-28 ENCOUNTER — Encounter (HOSPITAL_COMMUNITY)
Admission: RE | Admit: 2017-03-28 | Discharge: 2017-03-28 | Disposition: A | Discharge status: Home / Self Care | Payer: Self-pay | Source: Ambulatory Visit | Attending: Cardiology | Admitting: Cardiology

## 2017-04-01 ENCOUNTER — Encounter (HOSPITAL_COMMUNITY)
Admission: RE | Admit: 2017-04-01 | Discharge: 2017-04-01 | Disposition: A | Discharge status: Home / Self Care | Payer: Self-pay | Source: Ambulatory Visit | Attending: Cardiology | Admitting: Cardiology

## 2017-04-03 ENCOUNTER — Encounter (HOSPITAL_COMMUNITY)
Admission: RE | Admit: 2017-04-03 | Discharge: 2017-04-03 | Disposition: A | Discharge status: Home / Self Care | Payer: Self-pay | Source: Ambulatory Visit | Attending: Cardiology | Admitting: Cardiology

## 2017-04-03 DIAGNOSIS — I208 Other forms of angina pectoris: Secondary | ICD-10-CM | POA: Insufficient documentation

## 2017-04-03 DIAGNOSIS — E119 Type 2 diabetes mellitus without complications: Secondary | ICD-10-CM | POA: Insufficient documentation

## 2017-04-03 DIAGNOSIS — E785 Hyperlipidemia, unspecified: Secondary | ICD-10-CM | POA: Insufficient documentation

## 2017-04-04 ENCOUNTER — Encounter (HOSPITAL_COMMUNITY): Payer: Self-pay

## 2017-04-07 ENCOUNTER — Other Ambulatory Visit: Payer: Self-pay | Admitting: Family Medicine

## 2017-04-08 ENCOUNTER — Encounter (HOSPITAL_COMMUNITY)
Admission: RE | Admit: 2017-04-08 | Discharge: 2017-04-08 | Disposition: A | Discharge status: Home / Self Care | Payer: Self-pay | Source: Ambulatory Visit | Attending: Cardiology | Admitting: Cardiology

## 2017-04-10 ENCOUNTER — Encounter (HOSPITAL_COMMUNITY)
Admission: RE | Admit: 2017-04-10 | Discharge: 2017-04-10 | Disposition: A | Discharge status: Home / Self Care | Payer: Self-pay | Source: Ambulatory Visit | Attending: Cardiology | Admitting: Cardiology

## 2017-04-11 ENCOUNTER — Encounter (HOSPITAL_COMMUNITY)
Admission: RE | Admit: 2017-04-11 | Discharge: 2017-04-11 | Disposition: A | Discharge status: Home / Self Care | Payer: Self-pay | Source: Ambulatory Visit | Attending: Cardiology | Admitting: Cardiology

## 2017-04-15 ENCOUNTER — Encounter (HOSPITAL_COMMUNITY)
Admission: RE | Admit: 2017-04-15 | Discharge: 2017-04-15 | Disposition: A | Discharge status: Home / Self Care | Payer: Self-pay | Source: Ambulatory Visit | Attending: Cardiology | Admitting: Cardiology

## 2017-04-17 ENCOUNTER — Encounter (HOSPITAL_COMMUNITY)
Admission: RE | Admit: 2017-04-17 | Discharge: 2017-04-17 | Disposition: A | Discharge status: Home / Self Care | Payer: Self-pay | Source: Ambulatory Visit | Attending: Cardiology | Admitting: Cardiology

## 2017-04-18 ENCOUNTER — Encounter (HOSPITAL_COMMUNITY)
Admission: RE | Admit: 2017-04-18 | Discharge: 2017-04-18 | Disposition: A | Discharge status: Home / Self Care | Payer: Self-pay | Source: Ambulatory Visit | Attending: Cardiology | Admitting: Cardiology

## 2017-04-22 ENCOUNTER — Encounter (HOSPITAL_COMMUNITY): Payer: Self-pay

## 2017-04-24 ENCOUNTER — Encounter (HOSPITAL_COMMUNITY)
Admission: RE | Admit: 2017-04-24 | Discharge: 2017-04-24 | Disposition: A | Discharge status: Home / Self Care | Payer: Self-pay | Source: Ambulatory Visit | Attending: Cardiology | Admitting: Cardiology

## 2017-04-25 ENCOUNTER — Encounter (HOSPITAL_COMMUNITY)
Admission: RE | Admit: 2017-04-25 | Discharge: 2017-04-25 | Disposition: A | Discharge status: Home / Self Care | Payer: Self-pay | Source: Ambulatory Visit | Attending: Cardiology | Admitting: Cardiology

## 2017-04-28 ENCOUNTER — Ambulatory Visit: Payer: Medicare Other | Admitting: Cardiology

## 2017-04-29 ENCOUNTER — Encounter (HOSPITAL_COMMUNITY): Payer: Self-pay

## 2017-05-01 ENCOUNTER — Encounter (HOSPITAL_COMMUNITY)
Admission: RE | Admit: 2017-05-01 | Discharge: 2017-05-01 | Disposition: A | Discharge status: Home / Self Care | Payer: Self-pay | Source: Ambulatory Visit | Attending: Cardiology | Admitting: Cardiology

## 2017-05-02 ENCOUNTER — Encounter (HOSPITAL_COMMUNITY)
Admission: RE | Admit: 2017-05-02 | Discharge: 2017-05-02 | Disposition: A | Discharge status: Home / Self Care | Payer: Self-pay | Source: Ambulatory Visit | Attending: Cardiology | Admitting: Cardiology

## 2017-05-06 ENCOUNTER — Encounter (HOSPITAL_COMMUNITY)
Admission: RE | Admit: 2017-05-06 | Discharge: 2017-05-06 | Disposition: A | Discharge status: Home / Self Care | Payer: Self-pay | Source: Ambulatory Visit | Attending: Cardiology | Admitting: Cardiology

## 2017-05-06 DIAGNOSIS — E119 Type 2 diabetes mellitus without complications: Secondary | ICD-10-CM | POA: Insufficient documentation

## 2017-05-06 DIAGNOSIS — E785 Hyperlipidemia, unspecified: Secondary | ICD-10-CM | POA: Insufficient documentation

## 2017-05-06 DIAGNOSIS — I208 Other forms of angina pectoris: Secondary | ICD-10-CM | POA: Insufficient documentation

## 2017-05-08 ENCOUNTER — Encounter (HOSPITAL_COMMUNITY)
Admission: RE | Admit: 2017-05-08 | Discharge: 2017-05-08 | Disposition: A | Discharge status: Home / Self Care | Payer: Self-pay | Source: Ambulatory Visit | Attending: Cardiology | Admitting: Cardiology

## 2017-05-09 ENCOUNTER — Encounter (HOSPITAL_COMMUNITY)
Admission: RE | Admit: 2017-05-09 | Discharge: 2017-05-09 | Disposition: A | Discharge status: Home / Self Care | Payer: Self-pay | Source: Ambulatory Visit | Attending: Cardiology | Admitting: Cardiology

## 2017-05-13 ENCOUNTER — Encounter (HOSPITAL_COMMUNITY)
Admission: RE | Admit: 2017-05-13 | Discharge: 2017-05-13 | Disposition: A | Discharge status: Home / Self Care | Payer: Self-pay | Source: Ambulatory Visit | Attending: Cardiology | Admitting: Cardiology

## 2017-05-15 ENCOUNTER — Encounter (HOSPITAL_COMMUNITY)
Admission: RE | Admit: 2017-05-15 | Discharge: 2017-05-15 | Disposition: A | Discharge status: Home / Self Care | Payer: Self-pay | Source: Ambulatory Visit | Attending: Cardiology | Admitting: Cardiology

## 2017-05-16 ENCOUNTER — Encounter (HOSPITAL_COMMUNITY)
Admission: RE | Admit: 2017-05-16 | Discharge: 2017-05-16 | Disposition: A | Discharge status: Home / Self Care | Payer: Self-pay | Source: Ambulatory Visit | Attending: Cardiology | Admitting: Cardiology

## 2017-05-20 ENCOUNTER — Encounter (HOSPITAL_COMMUNITY)
Admission: RE | Admit: 2017-05-20 | Discharge: 2017-05-20 | Disposition: A | Discharge status: Home / Self Care | Payer: Self-pay | Source: Ambulatory Visit | Attending: Cardiology | Admitting: Cardiology

## 2017-05-22 ENCOUNTER — Encounter (HOSPITAL_COMMUNITY)
Admission: RE | Admit: 2017-05-22 | Discharge: 2017-05-22 | Disposition: A | Discharge status: Home / Self Care | Payer: Self-pay | Source: Ambulatory Visit | Attending: Cardiology | Admitting: Cardiology

## 2017-05-23 ENCOUNTER — Encounter (HOSPITAL_COMMUNITY)
Admission: RE | Admit: 2017-05-23 | Discharge: 2017-05-23 | Disposition: A | Discharge status: Home / Self Care | Payer: Self-pay | Source: Ambulatory Visit | Attending: Cardiology | Admitting: Cardiology

## 2017-05-27 ENCOUNTER — Encounter (HOSPITAL_COMMUNITY)
Admission: RE | Admit: 2017-05-27 | Discharge: 2017-05-27 | Disposition: A | Discharge status: Home / Self Care | Payer: Self-pay | Source: Ambulatory Visit | Attending: Cardiology | Admitting: Cardiology

## 2017-05-27 IMAGING — DX DG CHEST 2V
2 series · 2 of 2 positions shown · non-contrast
Comparison: CT 01/22/2015.

CLINICAL DATA: Nonproductive cough.

EXAM:
CHEST  2 VIEW

[chest pa]
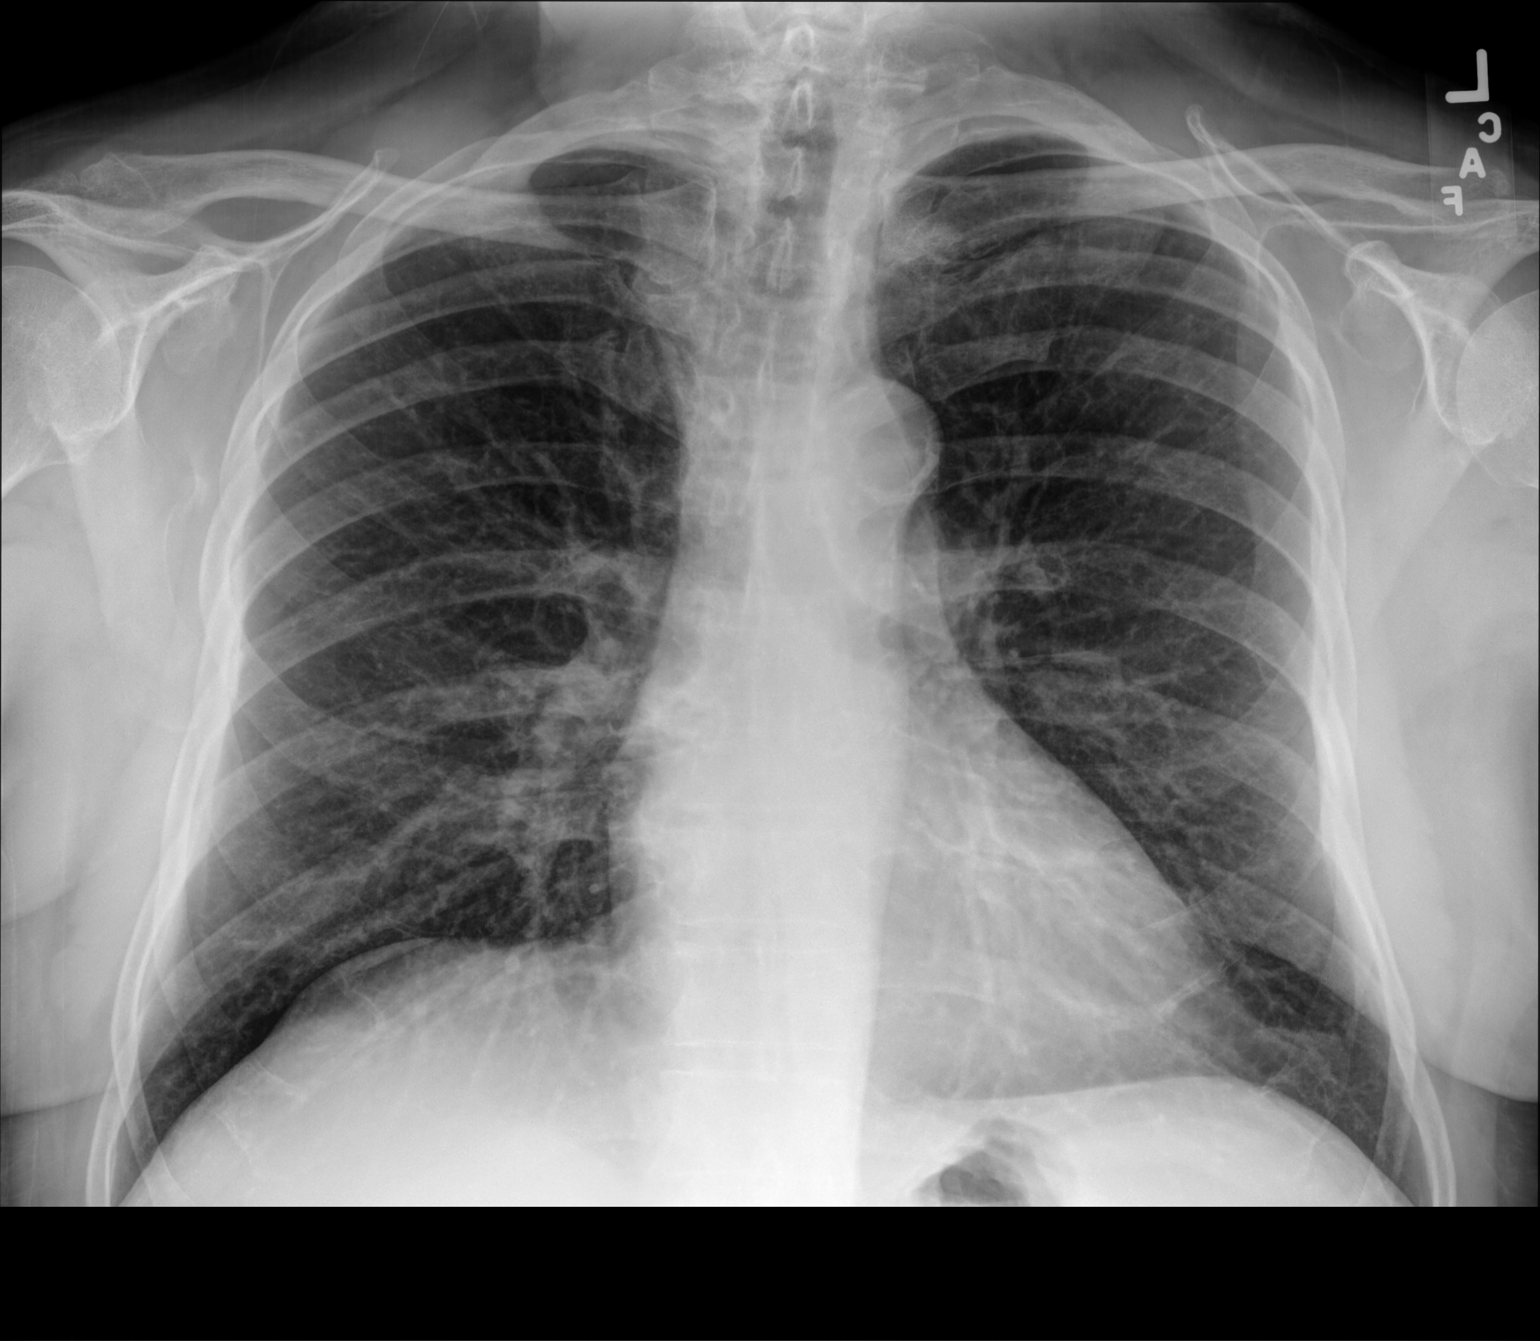

[chest lat]
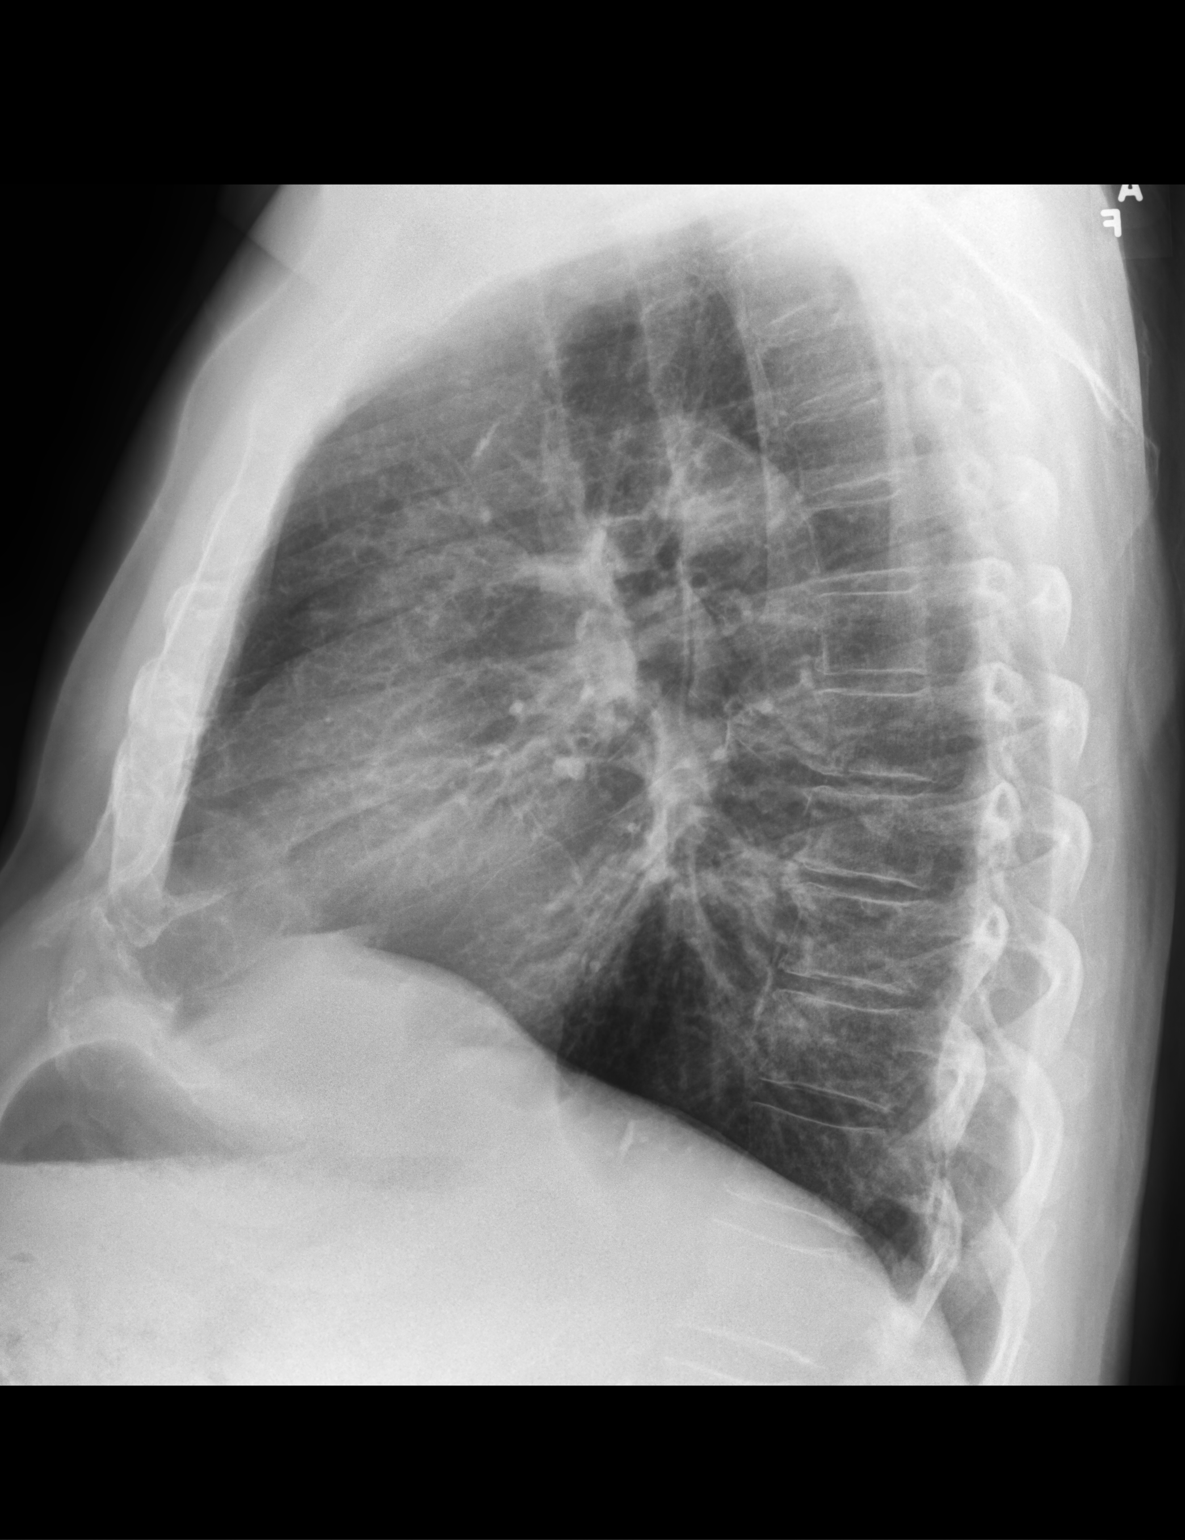

[2 of 2 positions shown; findings below may reference images not displayed]

FINDINGS: Mediastinum and hilar structures are normal. Mild lingular
subsegmental atelectasis. No change in benign density noted over the
left apex. No pulmonary nodule noted. No pleural effusion or
pneumothorax. Degenerative changes thoracic spine .
IMPRESSION: Mild lingular subsegmental atelectasis .

## 2017-05-29 ENCOUNTER — Encounter (HOSPITAL_COMMUNITY): Payer: Self-pay

## 2017-05-30 ENCOUNTER — Encounter (HOSPITAL_COMMUNITY): Payer: Self-pay

## 2017-06-03 ENCOUNTER — Encounter (HOSPITAL_COMMUNITY)
Admission: RE | Admit: 2017-06-03 | Discharge: 2017-06-03 | Disposition: A | Discharge status: Home / Self Care | Payer: Self-pay | Source: Ambulatory Visit | Attending: Cardiology | Admitting: Cardiology

## 2017-06-03 DIAGNOSIS — E119 Type 2 diabetes mellitus without complications: Secondary | ICD-10-CM | POA: Insufficient documentation

## 2017-06-03 DIAGNOSIS — E785 Hyperlipidemia, unspecified: Secondary | ICD-10-CM | POA: Insufficient documentation

## 2017-06-03 DIAGNOSIS — I208 Other forms of angina pectoris: Secondary | ICD-10-CM | POA: Insufficient documentation

## 2017-06-05 ENCOUNTER — Encounter (HOSPITAL_COMMUNITY)
Admission: RE | Admit: 2017-06-05 | Discharge: 2017-06-05 | Disposition: A | Discharge status: Home / Self Care | Payer: Self-pay | Source: Ambulatory Visit | Attending: Cardiology | Admitting: Cardiology

## 2017-06-06 ENCOUNTER — Encounter (HOSPITAL_COMMUNITY)
Admission: RE | Admit: 2017-06-06 | Discharge: 2017-06-06 | Disposition: A | Discharge status: Home / Self Care | Payer: Self-pay | Source: Ambulatory Visit | Attending: Cardiology | Admitting: Cardiology

## 2017-06-10 ENCOUNTER — Encounter (HOSPITAL_COMMUNITY)
Admission: RE | Admit: 2017-06-10 | Discharge: 2017-06-10 | Disposition: A | Discharge status: Home / Self Care | Payer: Self-pay | Source: Ambulatory Visit | Attending: Cardiology | Admitting: Cardiology

## 2017-06-12 ENCOUNTER — Encounter (HOSPITAL_COMMUNITY)
Admission: RE | Admit: 2017-06-12 | Discharge: 2017-06-12 | Disposition: A | Discharge status: Home / Self Care | Payer: Self-pay | Source: Ambulatory Visit | Attending: Cardiology | Admitting: Cardiology

## 2017-06-13 ENCOUNTER — Encounter (HOSPITAL_COMMUNITY)
Admission: RE | Admit: 2017-06-13 | Discharge: 2017-06-13 | Disposition: A | Discharge status: Home / Self Care | Payer: Self-pay | Source: Ambulatory Visit | Attending: Cardiology | Admitting: Cardiology

## 2017-06-16 ENCOUNTER — Ambulatory Visit (INDEPENDENT_AMBULATORY_CARE_PROVIDER_SITE_OTHER): Payer: Medicare Other | Admitting: Family Medicine

## 2017-06-16 ENCOUNTER — Encounter: Payer: Self-pay | Admitting: General Practice

## 2017-06-16 ENCOUNTER — Encounter: Payer: Self-pay | Admitting: Family Medicine

## 2017-06-16 VITALS — BP 128/66 | HR 66 | Temp 98.1°F | Resp 17 | Ht 68.0 in | Wt 201.0 lb

## 2017-06-16 DIAGNOSIS — E1159 Type 2 diabetes mellitus with other circulatory complications: Secondary | ICD-10-CM

## 2017-06-16 DIAGNOSIS — Z23 Encounter for immunization: Secondary | ICD-10-CM

## 2017-06-16 DIAGNOSIS — I2510 Atherosclerotic heart disease of native coronary artery without angina pectoris: Secondary | ICD-10-CM

## 2017-06-16 LAB — BASIC METABOLIC PANEL
BUN: 15 mg/dL (ref 6–23)
CO2: 31 mEq/L (ref 19–32)
Calcium: 9.3 mg/dL (ref 8.4–10.5)
Chloride: 105 mEq/L (ref 96–112)
Creatinine, Ser: 0.92 mg/dL (ref 0.40–1.50)
GFR: 84.91 mL/min (ref >60.00)
Glucose, Bld: 148 mg/dL — ABNORMAL HIGH (ref 70–99)
Potassium: 4.2 mEq/L (ref 3.5–5.1)
Sodium: 143 mEq/L (ref 135–145)

## 2017-06-16 LAB — HEMOGLOBIN A1C: Hgb A1c MFr Bld: 6.6 % — ABNORMAL HIGH (ref 4.6–6.5)

## 2017-06-16 NOTE — Patient Instructions (Signed)
Follow up in 3-4 months to recheck diabetes, BP, and cholesterol We'll notify you of your lab results and make any changes if needed Continue to work on healthy diet and regular exercise- you can do it! Check with the pharmacy to see if they have the Shingrix available Call with any questions or concerns Happy Fall!!!

## 2017-06-16 NOTE — Assessment & Plan Note (Signed)
Chronic problem.  Pt tolerating Metformin w/o difficulty.  UTD on foot exam, eye exam, and on ARB for renal protection.  Stressed the need for healthy diet and regular exercise.  Check labs.  Adjust meds prn

## 2017-06-16 NOTE — Progress Notes (Signed)
   Subjective:    Patient ID: Jacob Curry, male    DOB: 02-18-1941, 76 y.o.   MRN: [id number]  HPI DM- chronic problem.  On Metformin 500mg  BID.  UTD on foot exam, eye exam.  On ARB for renal protection.  Not checking sugars.  Denies symptomatic lows.  No numbness/tingling of hands/feet.  No CP, SOB, HAs, visual changes, edema.  No abd pain, N/V/D.   Review of Systems For ROS see HPI     Objective:   Physical Exam  Constitutional: He is oriented to person, place, and time. He appears well-developed and well-nourished. No distress.  HENT:  Head: Normocephalic and atraumatic.  Eyes: Pupils are equal, round, and reactive to light. Conjunctivae and EOM are normal.  Neck: Normal range of motion. Neck supple. No thyromegaly present.  Cardiovascular: Normal rate, regular rhythm, normal heart sounds and intact distal pulses.   No murmur heard. Pulmonary/Chest: Effort normal and breath sounds normal. No respiratory distress.  Abdominal: Soft. Bowel sounds are normal. He exhibits no distension.  Musculoskeletal: He exhibits no edema.  Lymphadenopathy:    He has no cervical adenopathy.  Neurological: He is alert and oriented to person, place, and time. No cranial nerve deficit.  Skin: Skin is warm and dry.  Psychiatric: He has a normal mood and affect. His behavior is normal.  Vitals reviewed.         Assessment & Plan:

## 2017-06-17 ENCOUNTER — Encounter (HOSPITAL_COMMUNITY)
Admission: RE | Admit: 2017-06-17 | Discharge: 2017-06-17 | Disposition: A | Discharge status: Home / Self Care | Payer: Medicare Other | Source: Ambulatory Visit | Attending: Cardiology | Admitting: Cardiology

## 2017-06-19 ENCOUNTER — Ambulatory Visit: Payer: Medicare Other | Admitting: Family Medicine

## 2017-06-19 ENCOUNTER — Encounter (HOSPITAL_COMMUNITY): Payer: Self-pay

## 2017-06-20 ENCOUNTER — Encounter (HOSPITAL_COMMUNITY): Payer: Self-pay

## 2017-06-24 ENCOUNTER — Encounter (HOSPITAL_COMMUNITY)
Admission: RE | Admit: 2017-06-24 | Discharge: 2017-06-24 | Disposition: A | Discharge status: Home / Self Care | Payer: Self-pay | Source: Ambulatory Visit | Attending: Cardiology | Admitting: Cardiology

## 2017-06-26 ENCOUNTER — Encounter (HOSPITAL_COMMUNITY)
Admission: RE | Admit: 2017-06-26 | Discharge: 2017-06-26 | Disposition: A | Discharge status: Home / Self Care | Payer: Self-pay | Source: Ambulatory Visit | Attending: Cardiology | Admitting: Cardiology

## 2017-06-27 ENCOUNTER — Encounter (HOSPITAL_COMMUNITY): Admission: RE | Admit: 2017-06-27 | Payer: Self-pay | Source: Ambulatory Visit

## 2017-07-01 ENCOUNTER — Encounter (HOSPITAL_COMMUNITY)
Admission: RE | Admit: 2017-07-01 | Discharge: 2017-07-01 | Disposition: A | Discharge status: Home / Self Care | Payer: Self-pay | Source: Ambulatory Visit | Attending: Cardiology | Admitting: Cardiology

## 2017-07-02 ENCOUNTER — Telehealth: Payer: Self-pay | Admitting: Internal Medicine

## 2017-07-02 NOTE — Telephone Encounter (Signed)
Spoke with pt, advised he would need an office visit because he has not been seen in a long time. CY can we fit him in, you have a couple held spots on the week of November 5. Please advise. If we can fir pt in for an appt?

## 2017-07-02 NOTE — Telephone Encounter (Signed)
Called and spoke with pt and he is aware of the appt with CY on 11/9

## 2017-07-02 NOTE — Telephone Encounter (Signed)
Ok to use one of those held spots for him.

## 2017-07-03 ENCOUNTER — Encounter (HOSPITAL_COMMUNITY)
Admission: RE | Admit: 2017-07-03 | Discharge: 2017-07-03 | Disposition: A | Discharge status: Home / Self Care | Payer: Self-pay | Source: Ambulatory Visit | Attending: Cardiology | Admitting: Cardiology

## 2017-07-03 DIAGNOSIS — E785 Hyperlipidemia, unspecified: Secondary | ICD-10-CM | POA: Insufficient documentation

## 2017-07-03 DIAGNOSIS — E119 Type 2 diabetes mellitus without complications: Secondary | ICD-10-CM | POA: Insufficient documentation

## 2017-07-03 DIAGNOSIS — I208 Other forms of angina pectoris: Secondary | ICD-10-CM | POA: Insufficient documentation

## 2017-07-04 ENCOUNTER — Encounter (HOSPITAL_COMMUNITY)
Admission: RE | Admit: 2017-07-04 | Discharge: 2017-07-04 | Disposition: A | Discharge status: Home / Self Care | Payer: Self-pay | Source: Ambulatory Visit | Attending: Cardiology | Admitting: Cardiology

## 2017-07-08 ENCOUNTER — Encounter (HOSPITAL_COMMUNITY)
Admission: RE | Admit: 2017-07-08 | Discharge: 2017-07-08 | Disposition: A | Discharge status: Home / Self Care | Payer: Self-pay | Source: Ambulatory Visit | Attending: Cardiology | Admitting: Cardiology

## 2017-07-10 ENCOUNTER — Other Ambulatory Visit: Payer: Self-pay | Admitting: Family Medicine

## 2017-07-10 ENCOUNTER — Encounter (HOSPITAL_COMMUNITY)
Admission: RE | Admit: 2017-07-10 | Discharge: 2017-07-10 | Disposition: A | Discharge status: Home / Self Care | Payer: Self-pay | Source: Ambulatory Visit | Attending: Cardiology | Admitting: Cardiology

## 2017-07-11 ENCOUNTER — Ambulatory Visit (INDEPENDENT_AMBULATORY_CARE_PROVIDER_SITE_OTHER): Payer: Medicare Other | Admitting: Internal Medicine

## 2017-07-11 ENCOUNTER — Encounter: Payer: Self-pay | Admitting: Internal Medicine

## 2017-07-11 ENCOUNTER — Encounter (HOSPITAL_COMMUNITY)
Admission: RE | Admit: 2017-07-11 | Discharge: 2017-07-11 | Disposition: A | Discharge status: Home / Self Care | Payer: Medicare Other | Source: Ambulatory Visit | Attending: Cardiology | Admitting: Cardiology

## 2017-07-11 VITALS — BP 120/66 | HR 57 | Ht 68.0 in | Wt 196.2 lb

## 2017-07-11 DIAGNOSIS — G4733 Obstructive sleep apnea (adult) (pediatric): Secondary | ICD-10-CM

## 2017-07-11 DIAGNOSIS — I2510 Atherosclerotic heart disease of native coronary artery without angina pectoris: Secondary | ICD-10-CM

## 2017-07-11 DIAGNOSIS — I480 Paroxysmal atrial fibrillation: Secondary | ICD-10-CM | POA: Diagnosis not present

## 2017-07-11 NOTE — Progress Notes (Signed)
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 Sleep Consult-Dr Ron Parker. Pt needs new oral appliance. Wears appliance every night for OSA Medical problem list includes P.Atrial fibrillation, CAD, HBP, NPSG 07/20/10- AHI 20.2/ hr [facility] 6 He had originally not wanted CPAP because of claustrophobia experienced during CPAP titration for his sleep study.  He describes excellent compliance with CPAP, using it all night every night.  Wife tells him he almost never snores with it.  It is comfortable, he sleeps well and he feels well rested.  He wants to replace the old worn-out device with a new one. ENT surgery-tonsils. He continues cardiac rehab with no recurrence of A. fib.  Prior to Admission medications   Medication Sig Start Date End Date Taking? Authorizing Provider  amLODipine (NORVASC) 2.5 MG tablet TAKE 1 TABLET EVERY DAY (NEED MD APPOINTMENT) 10/09/16  Yes Martinique, Peter M, MD  apixaban (ELIQUIS) 5 MG TABS tablet Take 1 tablet (5 mg total) by mouth 2 (two) times daily. 01/17/17  Yes Martinique, Peter M, MD  carvedilol (COREG) 12.5 MG tablet Take 1 tablet (12.5 mg total) by mouth 2 (two) times daily with a meal. 10/09/16  Yes Martinique, Peter M, MD  EPINEPHrine 0.3 mg/0.3 mL IJ SOAJ injection Inject 0.3 mLs (0.3 mg total) into the muscle once. 02/02/16  Yes Midge Minium, MD  fish oil-omega-3 fatty acids 1000 MG capsule Take 2 g by mouth daily.     Yes [provider]  losartan (COZAAR) 100 MG tablet Take 1 tablet (100 mg total) by mouth daily. 10/09/16  Yes Martinique, Peter M, MD  metFORMIN (GLUCOPHAGE) 500 MG tablet TAKE 1 TABLET EVERY MORNING. 07/10/17  Yes Midge Minium, MD  nitroGLYCERIN (NITROSTAT) 0.4 MG SL tablet Place 1 tablet (0.4 mg total) under the tongue every 5 (five) minutes as needed. 08/20/16  Yes Martinique, Peter M, MD  Saw Palmetto, Serenoa repens, (SAW PALMETTO PO) Take 1 tablet by mouth daily.     Yes [provider]  simvastatin (ZOCOR)  40 MG tablet TAKE 1 TABLET AT BEDTIME 10/09/16  Yes Martinique, Peter M, MD  traMADol (ULTRAM) 50 MG tablet Take 2 tablets (100 mg total) by mouth every 6 (six) hours as needed for moderate pain or severe pain. 09/02/16  Yes Jessy Oto, MD   Past Medical History:  Diagnosis Date  . CAD (coronary artery disease)    Dr Peter Martinique  . Carotid stenosis   . Complication of anesthesia    " DIFFICULTY WAKING "  . DM (diabetes mellitus) ([place])   . ED (erectile dysfunction)   . HLD (hyperlipidemia)   . HTN (hypertension)   . Myocardial infarction ([place]) 2007  . Obesity   . OSA (obstructive sleep apnea)    oral appliance, Dr Ron Parker  . Paroxysmal atrial fibrillation [facility])    Past Surgical History:  Procedure Laterality Date  . [place]  . APPENDECTOMY  1958  . CARDIAC CATHETERIZATION  2007 & 2011  . COLONOSCOPY  2002   negative; Dr Olevia Perches  . [place]  . TONSILLECTOMY     Family History  Problem Relation Age of Onset  . Diabetes Mother   . Heart attack Father         4 vessel CBAG @ 40  . Colon cancer Paternal Uncle   . Diabetes Brother   . Stroke Neg Hx    Social History   Socioeconomic History  . Marital status: Married  Spouse name: Not on file  . Number of children: Not on file  . Years of education: Not on file  . Highest education level: Not on file  Social Needs  . Financial resource strain: Not on file  . Food insecurity - worry: Not on file  . Food insecurity - inability: Not on file  . Transportation needs - medical: Not on file  . Transportation needs - non-medical: Not on file  Occupational History  . Occupation: Art gallery manager / Haematologist  Tobacco Use  . Smoking status: Former Smoker    Packs/day: 1.00    Last attempt to quit: 09/02/1990    Years since quitting: 26.8  . Smokeless tobacco: Never Used  . Tobacco comment: onset age [age] up to 1 ppd  Substance and Sexual Activity  . Alcohol use: No  . Drug use: Not on file  . Sexual  activity: Not on file  Other Topics Concern  . Not on file  Social History Narrative  . Not on file   ROS-see HPI   + = pos Constitutional:    weight loss, night sweats, fevers, chills, fatigue, lassitude. HEENT:    headaches, difficulty swallowing, tooth/dental problems, sore throat,       sneezing, itching, ear ache, nasal congestion, post nasal drip, snoring CV:    chest pain, orthopnea, PND, swelling in lower extremities, anasarca,                                  dizziness, palpitations Resp:   shortness of breath with exertion or at rest.                productive cough,   non-productive cough, coughing up of blood.              change in color of mucus.  wheezing.   Skin:    rash or lesions. GI:  No-   heartburn, indigestion, abdominal pain, nausea, vomiting, diarrhea,                 change in bowel habits, loss of appetite GU: dysuria, change in color of urine, no urgency or frequency.   flank pain. MS:   joint pain, stiffness, decreased range of motion, back pain. Neuro-     nothing unusual Psych:  change in mood or affect.  depression or anxiety.   memory loss.  OBJ- Physical Exam General- Alert, Oriented, Affect-appropriate, Distress- none acute, + overweight Skin- rash-none, lesions- none, excoriation- none Lymphadenopathy- none Head- atraumatic            Eyes- Gross vision intact, PERRLA, conjunctivae and secretions clear            Ears- Hearing, canals-normal            Nose- Clear, no-Septal dev, mucus, polyps, erosion, perforation             Throat- Mallampati IV , mucosa clear , drainage- none, tonsils- atrophic Neck- flexible , trachea midline, no stridor , thyroid nl, carotid no bruit Chest - symmetrical excursion , unlabored           Heart/CV- RRR , no murmur , no gallop  , no rub, nl s1 s2                           - JVD- none , edema- none, stasis changes- none, varices- none  Lung- clear to P&A, wheeze- none, cough- none , dullness-none, rub-  none           Chest wall-  Abd-  Br/ Gen/ Rectal- Not done, not indicated Extrem- cyanosis- none, clubbing, none, atrophy- none, strength- nl Neuro- grossly intact to observation

## 2017-07-11 NOTE — Assessment & Plan Note (Signed)
He has been extremely satisfied with his oral appliance in the care of Dr. Ron Parker. Plan-recommend replacement of worn out oral appliance.  We will be happy to see him again as needed.

## 2017-07-11 NOTE — Patient Instructions (Signed)
Order- renewal script for Dr Ron Parker-  Continue oral appliance therapy for OSA, Replace old/ worn out appliance      Dx OSA  Please call if we can help

## 2017-07-11 NOTE — Assessment & Plan Note (Signed)
He indicates this was a one-time episode when he was acutely ill, with no recurrence.  He is followed by cardiology and continues in cardiac rehab.

## 2017-07-14 ENCOUNTER — Encounter: Payer: Self-pay | Admitting: Cardiology

## 2017-07-15 ENCOUNTER — Encounter (HOSPITAL_COMMUNITY)
Admission: RE | Admit: 2017-07-15 | Discharge: 2017-07-15 | Disposition: A | Discharge status: Home / Self Care | Payer: Self-pay | Source: Ambulatory Visit | Attending: Cardiology | Admitting: Cardiology

## 2017-07-17 ENCOUNTER — Encounter (HOSPITAL_COMMUNITY)
Admission: RE | Admit: 2017-07-17 | Discharge: 2017-07-17 | Disposition: A | Discharge status: Home / Self Care | Payer: Medicare Other | Source: Ambulatory Visit | Attending: Cardiology | Admitting: Cardiology

## 2017-07-18 ENCOUNTER — Encounter (HOSPITAL_COMMUNITY)
Admission: RE | Admit: 2017-07-18 | Discharge: 2017-07-18 | Disposition: A | Discharge status: Home / Self Care | Payer: Medicare Other | Source: Ambulatory Visit | Attending: Cardiology | Admitting: Cardiology

## 2017-07-22 ENCOUNTER — Encounter (HOSPITAL_COMMUNITY)
Admission: RE | Admit: 2017-07-22 | Discharge: 2017-07-22 | Disposition: A | Discharge status: Home / Self Care | Payer: Self-pay | Source: Ambulatory Visit | Attending: Cardiology | Admitting: Cardiology

## 2017-07-23 ENCOUNTER — Ambulatory Visit (INDEPENDENT_AMBULATORY_CARE_PROVIDER_SITE_OTHER): Payer: Medicare Other | Admitting: Family Medicine

## 2017-07-23 ENCOUNTER — Encounter: Payer: Self-pay | Admitting: Family Medicine

## 2017-07-23 VITALS — BP 118/62 | Temp 98.0°F | Ht 68.0 in | Wt 195.6 lb

## 2017-07-23 DIAGNOSIS — R05 Cough: Secondary | ICD-10-CM

## 2017-07-23 DIAGNOSIS — R059 Cough, unspecified: Secondary | ICD-10-CM

## 2017-07-23 DIAGNOSIS — J069 Acute upper respiratory infection, unspecified: Secondary | ICD-10-CM | POA: Diagnosis not present

## 2017-07-23 MED ORDER — METHYLPREDNISOLONE ACETATE 80 MG/ML IJ SUSP
80.0000 mg | Freq: Once | INTRAMUSCULAR | Status: AC
  Administered 2017-07-23: 80 mg via INTRAMUSCULAR

## 2017-07-23 MED ORDER — IPRATROPIUM BROMIDE 0.06 % NA SOLN: 2.0000 | Freq: Four times a day (QID) | NASAL | 0 refills | Status: DC

## 2017-07-23 MED ORDER — AZITHROMYCIN 250 MG PO TABS: ORAL_TABLET | ORAL | 0 refills | Status: DC

## 2017-07-23 NOTE — Progress Notes (Signed)
    Subjective:  Jacob Curry is a 76 y.o. male who presents today with a chief complaint of cough.   HPI:  Cough, acute issue Symptoms started 2 days ago. Worsening since then.  Associated symptoms include some drainage and sore throat.  Tried hernia which helped a small amount.  No facial pain.  Mild amount of dizziness.  No other medications tried.  Says he gets similar symptoms about once a year.  No known sick contacts.  No fevers.  No shortness of breath.  Did have a little wheeze this morning.  But none since.  ROS: Per HPI  PMH: Smoking history reviewed.  Former smoker.  Objective:  Physical Exam: BP 118/62   Temp 98 F (36.7 C)   Ht 5\' 8"  (1.727 m)   Wt 195 lb 9.6 oz (88.7 kg)   SpO2 96%   BMI 29.74 kg/m   Gen: NAD, resting comfortably HEENT: TMs clear.  Oropharynx erythematous.  Nasal mucosa boggy with clear nasal discharge.  No lymphadenopathy. CV: RRR with no murmurs appreciated Pulm: NWOB, CTAB with no crackles, wheezes, or rhonchi  Assessment/Plan:  Cough Patient does not have any overt signs of bacterial infection, however with his smoking history it is possible that he has some underlying lung disease/COPD.  His respiratory status today is stable.  I do not see in the chart where he has had PFTs to confirm or disconfirm lung disease in the past.  Given his smoking history  and possibility for COPD, 80 mg of Depo-Medrol was given today in office.  A prescription for azithromycin was also sent in for patient with instruction to start if symptoms not improving in the next 1-2 days.  Sent in Atrovent nasal spray, as patient has a significant amount of upper respiratory symptoms that are contributing.  Encouraged good oral hydration.  Return precautions reviewed.  Follow-up as needed.  Algis Greenhouse. Jerline Pain, MD 07/23/2017 11:17 AM

## 2017-07-24 NOTE — Progress Notes (Signed)
[name] Date of Birth: September 11, 1940 Medical Record #[id number]  History of Present Illness: Jacob Curry is seen for follow up CAD. He has a known history of coronary disease with cardiac catheterization in November 2007 showing total occlusion of a large left circumflex vessel with good left to left collaterals. There was 40% disease in the left main and origin of LAD. 90% mid diagonal, 80-90% mid RCA and 90% RV marginal branch.  Ejection fraction was 50-55%. Prior to this he had a nuclear stress test- he was able to exercise 8:30 with severe ischemia of the inferolateral wall. He has been managed medically. Repeat cardiac cath in May 2011 and May 2016 showed no significant change. He has a history of hypertension, hyperlipidemia, Pafib,  diabetes. He has a history of moderate obstructive sleep apnea. This is managed with an oral appliance. He was unable to tolerate CPAP therapy.  He is anticoagulated with Eliquis.   In February he was evaluated by Ortho for left hip pain. 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 Managed conservatively with improvement.  On follow up today he developed a recent bronchitis. States he gets this every year at this time. Completed a course of Zithromax. Had steroids last week. Still with dry cough. No hemoptysis. No chest pain or palpitations. No edema.   Current Outpatient Medications on File Prior to Visit  Medication Sig Dispense Refill  . amLODipine (NORVASC) 2.5 MG tablet TAKE 1 TABLET EVERY DAY (NEED MD APPOINTMENT) 90 tablet 3  . apixaban (ELIQUIS) 5 MG TABS tablet Take 1 tablet (5 mg total) by mouth 2 (two) times daily. 180 tablet 1  . carvedilol (COREG) 12.5 MG tablet Take 1 tablet (12.5 mg total) by mouth 2 (two) times daily with a meal. 270 tablet 3  . EPINEPHrine 0.3 mg/0.3 mL IJ SOAJ injection Inject 0.3 mLs (0.3 mg total) into the muscle once. 1 Device 3  . fish oil-omega-3 fatty acids 1000 MG capsule Take 2 g by mouth daily.      Marland Kitchen ipratropium (ATROVENT)  0.06 % nasal spray Place 2 sprays into both nostrils 4 (four) times daily for 5 days. 15 mL 0  . losartan (COZAAR) 100 MG tablet Take 1 tablet (100 mg total) by mouth daily. 90 tablet 3  . metFORMIN (GLUCOPHAGE) 500 MG tablet TAKE 1 TABLET EVERY MORNING. 90 tablet 0  . nitroGLYCERIN (NITROSTAT) 0.4 MG SL tablet Place 1 tablet (0.4 mg total) under the tongue every 5 (five) minutes as needed. 25 tablet 11  . Saw Palmetto, Serenoa repens, (SAW PALMETTO PO) Take 1 tablet by mouth daily.      . simvastatin (ZOCOR) 40 MG tablet TAKE 1 TABLET AT BEDTIME 90 tablet 3  . traMADol (ULTRAM) 50 MG tablet Take 2 tablets (100 mg total) by mouth every 6 (six) hours as needed for moderate pain or severe pain. 30 tablet 1   No current facility-administered medications on file prior to visit.     Allergies  Allergen Reactions  . Acetaminophen Other (See Comments)    Drug induced hepatitis  . Oxycodone-Acetaminophen Other (See Comments)    Drug induced hepatitis  . Flomax [Tamsulosin Hcl] Other (See Comments)    incontinence    Past Medical History:  Diagnosis Date  . CAD (coronary artery disease)    Dr Peter Martinique  . Carotid stenosis   . Complication of anesthesia    " DIFFICULTY WAKING "  . DM (diabetes mellitus) ([place])   . ED (erectile dysfunction)   .  HLD (hyperlipidemia)   . HTN (hypertension)   . Myocardial infarction ([place]) 2007  . Obesity   . OSA (obstructive sleep apnea)    oral appliance, Dr Ron Parker  . Paroxysmal atrial fibrillation [facility])     Past Surgical History:  Procedure Laterality Date  . [place]  . APPENDECTOMY  1958  . CARDIAC CATHETERIZATION  2007 & 2011  . CARDIAC CATHETERIZATION N/A 01/13/2015   Procedure: Left Heart Cath and Coronary Angiography;  Surgeon: Peter M Martinique, MD;  Location: [place] [facility];  Service: Cardiovascular;  Laterality: N/A;  . COLONOSCOPY  2002   negative; Dr Olevia Perches  . [place]  . TONSILLECTOMY      Social  History   Tobacco Use  Smoking Status Former Smoker  . Packs/day: 1.00  . Last attempt to quit: 09/02/1990  . Years since quitting: 26.9  Smokeless Tobacco Never Used  Tobacco Comment   onset age [age] up to 1 ppd    Social History   Substance and Sexual Activity  Alcohol Use No    Family History  Problem Relation Age of Onset  . Diabetes Mother   . Heart attack Father         4 vessel CBAG @ 48  . Colon cancer Paternal Uncle   . Diabetes Brother   . Stroke Neg Hx     Review of Systems: As noted in history of present illness.  All other systems were reviewed and are negative.  Physical Exam: BP (!) 152/75   Pulse 69   Ht 5\' 8"  (1.727 m)   Wt 192 lb 9.6 oz (87.4 kg)   SpO2 95%   BMI 29.28 kg/m  GENERAL:  Well appearing, overweight WM HEENT:  PERRL, EOMI, sclera are clear. Oropharynx is clear. NECK:  No jugular venous distention, carotid upstroke brisk and symmetric, no bruits, no thyromegaly or adenopathy LUNGS:  Clear to auscultation bilaterally CHEST:  Unremarkable HEART:  RRR,  PMI not displaced or sustained,S1 and S2 within normal limits, no S3, no S4: no clicks, no rubs, no murmurs ABD:  Soft, nontender. BS +, no masses or bruits. No hepatomegaly, no splenomegaly EXT:  2 + pulses throughout, no edema, no cyanosis no clubbing SKIN:  Warm and dry.  No rashes NEURO:  Alert and oriented x 3. Cranial nerves II through XII intact. PSYCH:  Cognitively intact    LABORATORY DATA: Lab Results  Component Value Date   WBC 9.9 01/31/2017   HGB 14.2 01/31/2017   HCT 42.8 01/31/2017   PLT 215.0 01/31/2017   GLUCOSE 148 (H) 06/16/2017   CHOL 111 01/31/2017   TRIG 222.0 (H) 01/31/2017   HDL 47.20 01/31/2017   LDLDIRECT 41.0 01/31/2017   LDLCALC 43 02/05/2016   ALT 23 01/31/2017   AST 21 01/31/2017   NA 143 06/16/2017   K 4.2 06/16/2017   CL 105 06/16/2017   CREATININE 0.92 06/16/2017   BUN 15 06/16/2017   CO2 31 06/16/2017   TSH 2.41 01/31/2017   PSA 1.70  01/04/2009   INR 1.75 (H) 01/12/2015   HGBA1C 6.6 (H) 06/16/2017   MICROALBUR 0.4 03/09/2012    Assessment / Plan: 1. 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 Moderate to severe 3 vessel disease. Patient is asymptomatic on medical therapy. Cardiac cath  in May 2016 unchanged from 2007 and 2011.  Will continue medical therapy and monitor for any new symptoms.   2. Hypertension, mildly  elevated today but has been controlled this month.  3. Hyperlipidemia well controlled on medication.  LDL 43 on Zocor.  4. Atrial fibrillation, paroxysmal. Patient's pulse is normal today. He is asymptomatic.  His rate was controlled on beta blocker therapy.  He does have a high Chadvasc score of 4. Continue anticoagulation with Eliquis 5 mg twice a day.   5. DM type 2. On metformin- followed by primary care. He is doing better with diet and has lost 3 lbs.    I will follow up in 6 months.

## 2017-07-28 ENCOUNTER — Encounter: Payer: Self-pay | Admitting: Cardiology

## 2017-07-28 ENCOUNTER — Ambulatory Visit (INDEPENDENT_AMBULATORY_CARE_PROVIDER_SITE_OTHER): Payer: Medicare Other | Admitting: Cardiology

## 2017-07-28 VITALS — BP 152/75 | HR 69 | Ht 68.0 in | Wt 192.6 lb

## 2017-07-28 DIAGNOSIS — I2510 Atherosclerotic heart disease of native coronary artery without angina pectoris: Secondary | ICD-10-CM | POA: Diagnosis not present

## 2017-07-28 DIAGNOSIS — E7800 Pure hypercholesterolemia, unspecified: Secondary | ICD-10-CM

## 2017-07-28 DIAGNOSIS — I480 Paroxysmal atrial fibrillation: Secondary | ICD-10-CM

## 2017-07-28 NOTE — Patient Instructions (Signed)
Continue your current therapy  I will see you in 6 months.   

## 2017-07-29 ENCOUNTER — Encounter (HOSPITAL_COMMUNITY): Payer: Self-pay

## 2017-07-31 ENCOUNTER — Encounter (HOSPITAL_COMMUNITY)
Admission: RE | Admit: 2017-07-31 | Discharge: 2017-07-31 | Disposition: A | Discharge status: Home / Self Care | Payer: Medicare Other | Source: Ambulatory Visit | Attending: Cardiology | Admitting: Cardiology

## 2017-08-01 ENCOUNTER — Encounter (HOSPITAL_COMMUNITY)
Admission: RE | Admit: 2017-08-01 | Discharge: 2017-08-01 | Disposition: A | Discharge status: Home / Self Care | Payer: Medicare Other | Source: Ambulatory Visit | Attending: Cardiology | Admitting: Cardiology

## 2017-08-05 ENCOUNTER — Encounter (HOSPITAL_COMMUNITY)
Admission: RE | Admit: 2017-08-05 | Discharge: 2017-08-05 | Disposition: A | Discharge status: Home / Self Care | Payer: Self-pay | Source: Ambulatory Visit | Attending: Cardiology | Admitting: Cardiology

## 2017-08-05 DIAGNOSIS — E119 Type 2 diabetes mellitus without complications: Secondary | ICD-10-CM | POA: Insufficient documentation

## 2017-08-05 DIAGNOSIS — E785 Hyperlipidemia, unspecified: Secondary | ICD-10-CM | POA: Insufficient documentation

## 2017-08-05 DIAGNOSIS — I208 Other forms of angina pectoris: Secondary | ICD-10-CM | POA: Insufficient documentation

## 2017-08-07 ENCOUNTER — Encounter (HOSPITAL_COMMUNITY)
Admission: RE | Admit: 2017-08-07 | Discharge: 2017-08-07 | Disposition: A | Discharge status: Home / Self Care | Payer: Self-pay | Source: Ambulatory Visit | Attending: Cardiology | Admitting: Cardiology

## 2017-08-08 ENCOUNTER — Encounter (HOSPITAL_COMMUNITY)
Admission: RE | Admit: 2017-08-08 | Discharge: 2017-08-08 | Disposition: A | Discharge status: Home / Self Care | Payer: Self-pay | Source: Ambulatory Visit | Attending: Cardiology | Admitting: Cardiology

## 2017-08-08 DIAGNOSIS — I159 Secondary hypertension, unspecified: Secondary | ICD-10-CM | POA: Diagnosis not present

## 2017-08-08 DIAGNOSIS — Z6828 Body mass index (BMI) 28.0-28.9, adult: Secondary | ICD-10-CM | POA: Diagnosis not present

## 2017-08-08 DIAGNOSIS — I4891 Unspecified atrial fibrillation: Secondary | ICD-10-CM | POA: Diagnosis not present

## 2017-08-08 DIAGNOSIS — E668 Other obesity: Secondary | ICD-10-CM | POA: Diagnosis not present

## 2017-08-08 DIAGNOSIS — N138 Other obstructive and reflux uropathy: Secondary | ICD-10-CM | POA: Diagnosis not present

## 2017-08-08 DIAGNOSIS — N401 Enlarged prostate with lower urinary tract symptoms: Secondary | ICD-10-CM | POA: Diagnosis not present

## 2017-08-08 DIAGNOSIS — E119 Type 2 diabetes mellitus without complications: Secondary | ICD-10-CM | POA: Diagnosis not present

## 2017-08-12 ENCOUNTER — Encounter (HOSPITAL_COMMUNITY): Payer: Self-pay

## 2017-08-14 ENCOUNTER — Encounter (HOSPITAL_COMMUNITY)
Admission: RE | Admit: 2017-08-14 | Discharge: 2017-08-14 | Disposition: A | Discharge status: Home / Self Care | Payer: Self-pay | Source: Ambulatory Visit | Attending: Cardiology | Admitting: Cardiology

## 2017-08-14 ENCOUNTER — Telehealth: Payer: Self-pay | Admitting: Cardiology

## 2017-08-14 NOTE — Telephone Encounter (Signed)
Agree. If HR back to normal just continue with current meds and follow.  Peter Martinique MD, [facility]

## 2017-08-14 NOTE — Telephone Encounter (Signed)
[name] is calling because Mr. Jacob Curry has a question about his heart rate . His heart rate was high this morning . Please call

## 2017-08-14 NOTE — Telephone Encounter (Signed)
Medication Samples have been provided to the patient.  Drug name: Eliquis 5mg Qty: 28 (2 boxes)LOT: [id number].Date: 3/21  The patient has been instructed regarding the correct time, dose, and frequency of taking this medication, including desired effects and most common side effects.

## 2017-08-14 NOTE — Telephone Encounter (Signed)
Pt of Dr. Martinique - recent [place] 11/26  Spoke w patient. He called in to report fast HR noted while in cardiac rehab today. Notes this occurred at exercise, rate of ~150, EKG showed him to be in A Fib at the time. He wears an apple watch and has rechecked HRs once he returned home. Rates of 88, 78 last 2 times he checked. Notes BP stable, around 336 systolic.  Pt notes he missed taking his AM meds prior to cardiac rehab (usually takes around 5:30am) and took these once he got home. He usually cannot tell if he's in A Fib, this was 2nd recognized event. He is currently on anticoagulation (Eliquis), BB (carvedilol) and CCB (norvasc)  He denies SOB, chest pain, fatigue, lightheadedness or dizziness. He is aware to call should he have symptoms and to keep a check on his HR periodically.   As patient currently denies acute symptoms, and understands to call back if new concerns, will route to Dr. Martinique for routine f/u

## 2017-08-15 ENCOUNTER — Encounter (HOSPITAL_COMMUNITY)
Admission: RE | Admit: 2017-08-15 | Discharge: 2017-08-15 | Disposition: A | Discharge status: Home / Self Care | Payer: Self-pay | Source: Ambulatory Visit | Attending: Cardiology | Admitting: Cardiology

## 2017-08-18 ENCOUNTER — Telehealth: Payer: Self-pay | Admitting: Cardiology

## 2017-08-18 NOTE — Telephone Encounter (Signed)
With elevated HR persisting and finding of AFib at Rehab last week I think he has persistent Afib. HR looks Ok now so I would continue current therapy. Probably the best way to have this evaluated would be to have him seen in afib clinic to consider whether he needs a DCCV or antiarrhythmic drug.    Letasha Kershaw Martinique MD, [facility]

## 2017-08-18 NOTE — Telephone Encounter (Signed)
Returned call to patient Dr.Jordan's recommendations given.Appointment scheduled with Afib clinic 08/21/17 at 9:15 am.Code # for parking garage Welcome.

## 2017-08-18 NOTE — Telephone Encounter (Signed)
Returned call to patient.He stated he is concerned about his heart rate.Stated his normal resting heart rate is 65.Stated since last week heart rate 92 to 77,except at rehab last Thursday heart rate 150,at present 94.Stated he feels ok, but wanted to ask Dr.Jordan if he needs to wear a monitor or increase medication.Message sent to [place] for advice.

## 2017-08-18 NOTE — Telephone Encounter (Signed)
New message HR today 94  Patient c/o Palpitations:  High priority if patient c/o lightheadedness, shortness of breath, or chest pain  How long have you had palpitations/irregular HR/ Afib? Are you having the symptoms now? Increased HR started 12/13, no symptoms right now 1) Are you currently experiencing lightheadedness, SOB or CP? No  2) Do you have a history of afib (atrial fibrillation) or irregular heart rhythm? yes  3) Have you checked your BP or HR? (document readings if available): 60-->150  4) Are you experiencing any other symptoms? No

## 2017-08-19 ENCOUNTER — Encounter (HOSPITAL_COMMUNITY)
Admission: RE | Admit: 2017-08-19 | Discharge: 2017-08-19 | Disposition: A | Discharge status: Home / Self Care | Payer: Self-pay | Source: Ambulatory Visit | Attending: Cardiology | Admitting: Cardiology

## 2017-08-21 ENCOUNTER — Encounter (HOSPITAL_COMMUNITY): Payer: Self-pay | Admitting: Nurse Practitioner

## 2017-08-21 ENCOUNTER — Encounter (HOSPITAL_COMMUNITY)
Admission: RE | Admit: 2017-08-21 | Discharge: 2017-08-21 | Disposition: A | Discharge status: Home / Self Care | Payer: Self-pay | Source: Ambulatory Visit | Attending: Cardiology | Admitting: Cardiology

## 2017-08-21 ENCOUNTER — Ambulatory Visit (HOSPITAL_COMMUNITY)
Admission: RE | Admit: 2017-08-21 | Discharge: 2017-08-21 | Disposition: A | Discharge status: Home / Self Care | Payer: Medicare Other | Source: Ambulatory Visit | Attending: Nurse Practitioner | Admitting: Nurse Practitioner

## 2017-08-21 VITALS — BP 116/64 | HR 80 | Ht 68.0 in | Wt 195.0 lb

## 2017-08-21 DIAGNOSIS — I252 Old myocardial infarction: Secondary | ICD-10-CM | POA: Diagnosis not present

## 2017-08-21 DIAGNOSIS — E669 Obesity, unspecified: Secondary | ICD-10-CM | POA: Insufficient documentation

## 2017-08-21 DIAGNOSIS — Z7901 Long term (current) use of anticoagulants: Secondary | ICD-10-CM | POA: Diagnosis not present

## 2017-08-21 DIAGNOSIS — I2510 Atherosclerotic heart disease of native coronary artery without angina pectoris: Secondary | ICD-10-CM | POA: Insufficient documentation

## 2017-08-21 DIAGNOSIS — I480 Paroxysmal atrial fibrillation: Secondary | ICD-10-CM | POA: Diagnosis not present

## 2017-08-21 DIAGNOSIS — G4733 Obstructive sleep apnea (adult) (pediatric): Secondary | ICD-10-CM | POA: Insufficient documentation

## 2017-08-21 DIAGNOSIS — Z7984 Long term (current) use of oral hypoglycemic drugs: Secondary | ICD-10-CM | POA: Diagnosis not present

## 2017-08-21 DIAGNOSIS — E785 Hyperlipidemia, unspecified: Secondary | ICD-10-CM | POA: Diagnosis not present

## 2017-08-21 DIAGNOSIS — I10 Essential (primary) hypertension: Secondary | ICD-10-CM | POA: Diagnosis not present

## 2017-08-21 DIAGNOSIS — Z79899 Other long term (current) drug therapy: Secondary | ICD-10-CM | POA: Insufficient documentation

## 2017-08-21 DIAGNOSIS — E119 Type 2 diabetes mellitus without complications: Secondary | ICD-10-CM | POA: Diagnosis not present

## 2017-08-21 DIAGNOSIS — I4891 Unspecified atrial fibrillation: Secondary | ICD-10-CM | POA: Diagnosis present

## 2017-08-21 DIAGNOSIS — Z87891 Personal history of nicotine dependence: Secondary | ICD-10-CM | POA: Diagnosis not present

## 2017-08-21 DIAGNOSIS — I481 Persistent atrial fibrillation: Secondary | ICD-10-CM | POA: Insufficient documentation

## 2017-08-21 NOTE — Progress Notes (Signed)
Primary Care Physician: Jacob Minium, MD Referring Physician: Dr. Martinique   Jacob Curry is a 76 y.o. male with a h/o CAD, DM, HTN, OSA uses an oral appliance, and h/o afib several years ago associated with an URI. He exercises 3x a week at cardiac rehab for years in the maintenance group. He watches his heart rate very carefully during exercise and has to document his pulse after recovery. This last Thursday, his HR was up to 150 bpm at the end of class. He has noted elevated heart rates intermittently since then. Before Thursday, his  rate rarely got above 90 bpm. He is asymptomatic with the  elevated heart rates.He is usually very careful not to miss doses,but did miss a dose on 12/11.  He does not smoke, no alcohol or excessive caffeine. Exercises regularly and has recently lost around 4 lbs. Uses his oral appliance for OSA without fail. No identifiable trigger for afib. Ekg shows afib rate controlled at 80 bpm.  Today, he denies symptoms of palpitations, chest pain, shortness of breath, orthopnea, PND, lower extremity edema, dizziness, presyncope, syncope, or neurologic sequela. The patient is tolerating medications without difficulties and is otherwise without complaint today.   Past Medical History:  Diagnosis Date  . CAD (coronary artery disease)    Dr Jacob Curry  . Carotid stenosis   . Complication of anesthesia    " DIFFICULTY WAKING "  . DM (diabetes mellitus) ([employer])   . ED (erectile dysfunction)   . HLD (hyperlipidemia)   . HTN (hypertension)   . Myocardial infarction (Jacob Curry) 2007  . Obesity   . OSA (obstructive sleep apnea)    oral appliance, Dr Jacob Curry  . Paroxysmal atrial fibrillation [facility])    Past Surgical History:  Procedure Laterality Date  . [place]  . APPENDECTOMY  1958  . CARDIAC CATHETERIZATION  2007 & 2011  . CARDIAC CATHETERIZATION N/A 01/13/2015   Procedure: Left Heart Cath and Coronary Angiography;  Surgeon: Jacob M Martinique, MD;   Location: [place] [facility];  Service: Cardiovascular;  Laterality: N/A;  . COLONOSCOPY  2002   negative; Dr Jacob Curry  . [place]  . TONSILLECTOMY      Current Outpatient Medications  Medication Sig Dispense Refill  . amLODipine (NORVASC) 2.5 MG tablet TAKE 1 TABLET EVERY DAY (NEED MD APPOINTMENT) 90 tablet 3  . apixaban (ELIQUIS) 5 MG TABS tablet Take 1 tablet (5 mg total) by mouth 2 (two) times daily. 180 tablet 1  . carvedilol (COREG) 12.5 MG tablet Take 1 tablet (12.5 mg total) by mouth 2 (two) times daily with a meal. 270 tablet 3  . losartan (COZAAR) 100 MG tablet Take 1 tablet (100 mg total) by mouth daily. 90 tablet 3  . metFORMIN (GLUCOPHAGE) 500 MG tablet TAKE 1 TABLET EVERY MORNING. 90 tablet 0  . Multiple Vitamin (MULTIVITAMIN) tablet Take 1 tablet by mouth daily.    . nitroGLYCERIN (NITROSTAT) 0.4 MG SL tablet Place 1 tablet (0.4 mg total) under the tongue every 5 (five) minutes as needed. 25 tablet 11  . Saw Palmetto, Serenoa repens, (SAW PALMETTO PO) Take 1 tablet by mouth daily.      . simvastatin (ZOCOR) 40 MG tablet TAKE 1 TABLET AT BEDTIME 90 tablet 3  . EPINEPHrine 0.3 mg/0.3 mL IJ SOAJ injection Inject 0.3 mLs (0.3 mg total) into the muscle once. (Patient not taking: Reported on 08/21/2017) 1 Device 3  . fish oil-omega-3 fatty acids  1000 MG capsule Take 2 g by mouth daily.      Marland Kitchen ipratropium (ATROVENT) 0.06 % nasal spray Place 2 sprays into both nostrils 4 (four) times daily for 5 days. (Patient not taking: Reported on 08/21/2017) 15 mL 0   No current facility-administered medications for this encounter.     Allergies  Allergen Reactions  . Acetaminophen Other (See Comments)    Drug induced hepatitis  . Oxycodone-Acetaminophen Other (See Comments)    Drug induced hepatitis  . Flomax [Tamsulosin Hcl] Other (See Comments)    incontinence    Social History   Socioeconomic History  . Marital status: Married    Spouse name: Not on file  . Number of  children: Not on file  . Years of education: Not on file  . Highest education level: Not on file  Social Needs  . Financial resource strain: Not on file  . Food insecurity - worry: Not on file  . Food insecurity - inability: Not on file  . Transportation needs - medical: Not on file  . Transportation needs - non-medical: Not on file  Occupational History  . Occupation: Art gallery manager / Haematologist  Tobacco Use  . Smoking status: Former Smoker    Packs/day: 1.00    Last attempt to quit: 09/02/1990    Years since quitting: 26.9  . Smokeless tobacco: Never Used  . Tobacco comment: onset age [age] up to 1 ppd  Substance and Sexual Activity  . Alcohol use: No  . Drug use: No  . Sexual activity: Not on file  Other Topics Concern  . Not on file  Social History Narrative  . Not on file    Family History  Problem Relation Age of Onset  . Diabetes Mother   . Heart attack Father         4 vessel CBAG @ 61  . Colon cancer Paternal Uncle   . Diabetes Brother   . Stroke Neg Hx     ROS- All systems are reviewed and negative except as per the HPI above  Physical Exam: Vitals:   08/21/17 0917  BP: 116/64  Pulse: 80  Weight: 195 lb (88.5 kg)  Height: 5\' 8"  (1.727 m)   Wt Readings from Last 3 Encounters:  08/21/17 195 lb (88.5 kg)  07/28/17 192 lb 9.6 oz (87.4 kg)  07/23/17 195 lb 9.6 oz (88.7 kg)    Labs: Lab Results  Component Value Date   NA 143 06/16/2017   K 4.2 06/16/2017   CL 105 06/16/2017   CO2 31 06/16/2017   GLUCOSE 148 (H) 06/16/2017   BUN 15 06/16/2017   CREATININE 0.92 06/16/2017   CALCIUM 9.3 06/16/2017   Lab Results  Component Value Date   INR 1.75 (H) 01/12/2015   Lab Results  Component Value Date   CHOL 111 01/31/2017   HDL 47.20 01/31/2017   LDLCALC 43 02/05/2016   TRIG 222.0 (H) 01/31/2017     GEN- The patient is well appearing, alert and oriented x 3 today.   Head- normocephalic, atraumatic Eyes-  Sclera clear, conjunctiva pink Ears- hearing  intact Oropharynx- clear Neck- supple, no JVP Lymph- no cervical lymphadenopathy Lungs- Clear to ausculation bilaterally, normal work of breathing Heart- irregular rate and rhythm, no murmurs, rubs or gallops, PMI not laterally displaced GI- soft, NT, ND, + BS Extremities- no clubbing, cyanosis, or edema MS- no significant deformity or atrophy Skin- no rash or lesion Psych- euthymic mood, full affect Neuro- strength and sensation are  intact  EKG-afib at 80 bpm, qrs int 88 ms, qtc 422 ms. Epic records reviewed    Assessment and Plan: 1. Persistent afib  Apparently onset last Thursday, as pt follows his HR very closely with apple watch Will try a cardioversion but will have to wait until he has taken xarelto without fail which should be around 09/04/17 Will see back the week of 1/7 to schedule  In the meantime will update echo Continue xarelto with a chadsvasc score of at least 5 I will not increase Rate control as he is well rate controlled in office and pt is asymptomatic I did discuss antiarrythmics if cardioversion did not restore SR or ERAF  Jacob Curry C. Cherita Hebel, [facility] [address] [place], [place] [address] [phone]

## 2017-08-22 ENCOUNTER — Encounter (HOSPITAL_COMMUNITY)
Admission: RE | Admit: 2017-08-22 | Discharge: 2017-08-22 | Disposition: A | Discharge status: Home / Self Care | Payer: Self-pay | Source: Ambulatory Visit | Attending: Cardiology | Admitting: Cardiology

## 2017-08-25 ENCOUNTER — Ambulatory Visit (HOSPITAL_COMMUNITY)
Admission: RE | Admit: 2017-08-25 | Discharge: 2017-08-25 | Disposition: A | Discharge status: Home / Self Care | Payer: Medicare Other | Source: Ambulatory Visit | Attending: Nurse Practitioner | Admitting: Nurse Practitioner

## 2017-08-25 DIAGNOSIS — I480 Paroxysmal atrial fibrillation: Secondary | ICD-10-CM | POA: Diagnosis not present

## 2017-08-25 DIAGNOSIS — I340 Nonrheumatic mitral (valve) insufficiency: Secondary | ICD-10-CM | POA: Diagnosis not present

## 2017-08-25 NOTE — Progress Notes (Signed)
  Echocardiogram 2D Echocardiogram has been performed.  Jennette Dubin 08/25/2017, 8:44 AM

## 2017-08-28 ENCOUNTER — Encounter (HOSPITAL_COMMUNITY)
Admission: RE | Admit: 2017-08-28 | Discharge: 2017-08-28 | Disposition: A | Discharge status: Home / Self Care | Payer: Self-pay | Source: Ambulatory Visit | Attending: Cardiology | Admitting: Cardiology

## 2017-08-29 ENCOUNTER — Encounter (HOSPITAL_COMMUNITY)
Admission: RE | Admit: 2017-08-29 | Discharge: 2017-08-29 | Disposition: A | Discharge status: Home / Self Care | Payer: Self-pay | Source: Ambulatory Visit | Attending: Cardiology | Admitting: Cardiology

## 2017-09-01 ENCOUNTER — Encounter (HOSPITAL_COMMUNITY)
Admission: RE | Admit: 2017-09-01 | Discharge: 2017-09-01 | Disposition: A | Discharge status: Home / Self Care | Payer: Self-pay | Source: Ambulatory Visit | Attending: Cardiology | Admitting: Cardiology

## 2017-09-04 ENCOUNTER — Encounter (HOSPITAL_COMMUNITY)
Admission: RE | Admit: 2017-09-04 | Discharge: 2017-09-04 | Disposition: A | Discharge status: Home / Self Care | Payer: Self-pay | Source: Ambulatory Visit | Attending: Cardiology | Admitting: Cardiology

## 2017-09-04 DIAGNOSIS — E785 Hyperlipidemia, unspecified: Secondary | ICD-10-CM | POA: Insufficient documentation

## 2017-09-04 DIAGNOSIS — E119 Type 2 diabetes mellitus without complications: Secondary | ICD-10-CM | POA: Insufficient documentation

## 2017-09-04 DIAGNOSIS — I208 Other forms of angina pectoris: Secondary | ICD-10-CM | POA: Insufficient documentation

## 2017-09-05 ENCOUNTER — Encounter (HOSPITAL_COMMUNITY)
Admission: RE | Admit: 2017-09-05 | Discharge: 2017-09-05 | Disposition: A | Discharge status: Home / Self Care | Payer: Self-pay | Source: Ambulatory Visit | Attending: Cardiology | Admitting: Cardiology

## 2017-09-10 ENCOUNTER — Ambulatory Visit (HOSPITAL_COMMUNITY)
Admission: RE | Admit: 2017-09-10 | Discharge: 2017-09-10 | Disposition: A | Discharge status: Home / Self Care | Payer: Medicare Other | Source: Ambulatory Visit | Attending: Nurse Practitioner | Admitting: Nurse Practitioner

## 2017-09-10 ENCOUNTER — Encounter (HOSPITAL_COMMUNITY): Payer: Self-pay | Admitting: Nurse Practitioner

## 2017-09-10 VITALS — BP 126/74 | HR 74 | Ht 68.0 in | Wt 198.0 lb

## 2017-09-10 DIAGNOSIS — I252 Old myocardial infarction: Secondary | ICD-10-CM | POA: Diagnosis not present

## 2017-09-10 DIAGNOSIS — Z79899 Other long term (current) drug therapy: Secondary | ICD-10-CM | POA: Insufficient documentation

## 2017-09-10 DIAGNOSIS — G4733 Obstructive sleep apnea (adult) (pediatric): Secondary | ICD-10-CM | POA: Insufficient documentation

## 2017-09-10 DIAGNOSIS — E785 Hyperlipidemia, unspecified: Secondary | ICD-10-CM | POA: Diagnosis not present

## 2017-09-10 DIAGNOSIS — Z7984 Long term (current) use of oral hypoglycemic drugs: Secondary | ICD-10-CM | POA: Insufficient documentation

## 2017-09-10 DIAGNOSIS — Z87891 Personal history of nicotine dependence: Secondary | ICD-10-CM | POA: Diagnosis not present

## 2017-09-10 DIAGNOSIS — E119 Type 2 diabetes mellitus without complications: Secondary | ICD-10-CM | POA: Insufficient documentation

## 2017-09-10 DIAGNOSIS — Z6830 Body mass index (BMI) 30.0-30.9, adult: Secondary | ICD-10-CM | POA: Diagnosis not present

## 2017-09-10 DIAGNOSIS — E669 Obesity, unspecified: Secondary | ICD-10-CM | POA: Insufficient documentation

## 2017-09-10 DIAGNOSIS — I481 Persistent atrial fibrillation: Secondary | ICD-10-CM | POA: Diagnosis not present

## 2017-09-10 DIAGNOSIS — Z7901 Long term (current) use of anticoagulants: Secondary | ICD-10-CM | POA: Insufficient documentation

## 2017-09-10 DIAGNOSIS — I10 Essential (primary) hypertension: Secondary | ICD-10-CM | POA: Insufficient documentation

## 2017-09-10 DIAGNOSIS — I480 Paroxysmal atrial fibrillation: Secondary | ICD-10-CM | POA: Diagnosis not present

## 2017-09-10 DIAGNOSIS — I2510 Atherosclerotic heart disease of native coronary artery without angina pectoris: Secondary | ICD-10-CM | POA: Insufficient documentation

## 2017-09-10 LAB — BASIC METABOLIC PANEL
Anion gap: 7 (ref 5–15)
BUN: 15 mg/dL (ref 6–20)
CO2: 24 mmol/L (ref 22–32)
Calcium: 9.2 mg/dL (ref 8.9–10.3)
Chloride: 109 mmol/L (ref 101–111)
Creatinine, Ser: 0.98 mg/dL (ref 0.61–1.24)
GFR calc Af Amer: >60 mL/min (ref >60)
GFR calc non Af Amer: >60 mL/min (ref >60)
Glucose, Bld: 134 mg/dL — ABNORMAL HIGH (ref 65–99)
Potassium: 4.1 mmol/L (ref 3.5–5.1)
Sodium: 140 mmol/L (ref 135–145)

## 2017-09-10 LAB — CBC
HCT: 43.6 % (ref 39.0–52.0)
Hemoglobin: 14.1 g/dL (ref 13.0–17.0)
MCH: 29.1 pg (ref 26.0–34.0)
MCHC: 32.3 g/dL (ref 30.0–36.0)
MCV: 90.1 fL (ref 78.0–100.0)
Platelets: 191 10*3/uL (ref 150–400)
RBC: 4.84 MIL/uL (ref 4.22–5.81)
RDW: 13.9 % (ref 11.5–15.5)
WBC: 9.3 10*3/uL (ref 4.0–10.5)

## 2017-09-10 NOTE — Patient Instructions (Signed)
Cardioversion scheduled for Tuesday, January 15th  - Arrive at the [facility] and go to admitting at 10:30AM  -Do not eat or drink anything after midnight the night prior to your procedure.  - Take all your medication with a sip of water prior to arrival.  - You will not be able to drive home after your procedure.

## 2017-09-10 NOTE — Progress Notes (Signed)
Primary Care Physician: Jacob Minium, MD Referring Physician: Dr. Martinique   Jacob Curry is a 77 y.o. male with a h/o CAD, DM, HTN, OSA uses an oral appliance, and h/o afib several years ago associated with an URI. He exercises 3x a week at cardiac rehab for years in the maintenance group. He watches his heart rate very carefully during exercise and has to document his pulse after recovery. !10/15/16, his HR was up to 150 bpm at the end of class. He has noted elevated heart rates intermittently since then. Before Thursday, his  rate rarely got above 90 bpm. He is asymptomatic with the  elevated heart rates.He is usually very careful not to miss doses of  eliquis,but did miss a dose on 12/11.He does not smoke, no alcohol or excessive caffeine. Exercises regularly and has recently lost around 4 lbs. Uses his oral appliance for OSA without fail. No identifiable trigger for afib. Ekg shows afib rate controlled at 80 bpm.  F/u in afib clinc for 3 weeks of uninterrupted eliquis with plans for cardioversion. He is still asymptomatic with rate controlled afib, but  would like to try restore SR with cardioversion. He unfortunately missed another dose of eliquis 12/22 and will be eligible for cardioversion after 09/14/17.Echo updated without any significant structural abnormalities  Today, he denies symptoms of palpitations, chest pain, shortness of breath, orthopnea, PND, lower extremity edema, dizziness, presyncope, syncope, or neurologic sequela. The patient is tolerating medications without difficulties and is otherwise without complaint today.   Past Medical History:  Diagnosis Date  . CAD (coronary artery disease)    Jacob Jacob Curry  . Carotid stenosis   . Complication of anesthesia    " DIFFICULTY WAKING "  . DM (diabetes mellitus) ([place])   . ED (erectile dysfunction)   . HLD (hyperlipidemia)   . HTN (hypertension)   . Myocardial infarction ([place]) 2007  . Obesity   . OSA (obstructive  sleep apnea)    oral appliance, Jacob Curry  . Paroxysmal atrial fibrillation [facility])    Past Surgical History:  Procedure Laterality Date  . [place]  . APPENDECTOMY  1958  . CARDIAC CATHETERIZATION  2007 & 2011  . CARDIAC CATHETERIZATION N/A 01/13/2015   Procedure: Left Heart Cath and Coronary Angiography;  Surgeon: Jacob M Martinique, MD;  Location: [place];  Service: Cardiovascular;  Laterality: N/A;  . COLONOSCOPY  2002   negative; Jacob Jacob Curry  . [place]  . TONSILLECTOMY      Current Outpatient Medications  Medication Sig Dispense Refill  . amLODipine (NORVASC) 2.5 MG tablet TAKE 1 TABLET EVERY DAY (NEED MD APPOINTMENT) 90 tablet 3  . apixaban (ELIQUIS) 5 MG TABS tablet Take 1 tablet (5 mg total) by mouth 2 (two) times daily. 180 tablet 1  . carvedilol (COREG) 12.5 MG tablet Take 1 tablet (12.5 mg total) by mouth 2 (two) times daily with a meal. 270 tablet 3  . EPINEPHrine 0.3 mg/0.3 mL IJ SOAJ injection Inject 0.3 mLs (0.3 mg total) into the muscle once. 1 Device 3  . fish oil-omega-3 fatty acids 1000 MG capsule Take 2 g by mouth daily.      Marland Kitchen ipratropium (ATROVENT) 0.06 % nasal spray Place 2 sprays into both nostrils 4 (four) times daily for 5 days. 15 mL 0  . losartan (COZAAR) 100 MG tablet Take 1 tablet (100 mg total) by mouth daily. 90 tablet 3  . metFORMIN (GLUCOPHAGE)  500 MG tablet TAKE 1 TABLET EVERY MORNING. 90 tablet 0  . Multiple Vitamin (MULTIVITAMIN) tablet Take 1 tablet by mouth daily.    . nitroGLYCERIN (NITROSTAT) 0.4 MG SL tablet Place 1 tablet (0.4 mg total) under the tongue every 5 (five) minutes as needed. 25 tablet 11  . Saw Palmetto, Serenoa repens, (SAW PALMETTO PO) Take 1 tablet by mouth daily.      . simvastatin (ZOCOR) 40 MG tablet TAKE 1 TABLET AT BEDTIME 90 tablet 3   No current facility-administered medications for this encounter.     Allergies  Allergen Reactions  . Acetaminophen Other (See Comments)    Drug induced  hepatitis  . Oxycodone-Acetaminophen Other (See Comments)    Drug induced hepatitis  . Flomax [Tamsulosin Hcl] Other (See Comments)    incontinence    Social History   Socioeconomic History  . Marital status: Married    Spouse name: Not on file  . Number of children: Not on file  . Years of education: Not on file  . Highest education level: Not on file  Social Needs  . Financial resource strain: Not on file  . Food insecurity - worry: Not on file  . Food insecurity - inability: Not on file  . Transportation needs - medical: Not on file  . Transportation needs - non-medical: Not on file  Occupational History  . Occupation: Art gallery manager / Haematologist  Tobacco Use  . Smoking status: Former Smoker    Packs/day: 1.00    Last attempt to quit: 09/02/1990    Years since quitting: 27.0  . Smokeless tobacco: Never Used  . Tobacco comment: onset age [age] up to 1 ppd  Substance and Sexual Activity  . Alcohol use: No  . Drug use: No  . Sexual activity: Not on file  Other Topics Concern  . Not on file  Social History Narrative  . Not on file    Family History  Problem Relation Age of Onset  . Diabetes Mother   . Heart attack Father         4 vessel CBAG @ 78  . Colon cancer Paternal Uncle   . Diabetes Brother   . Stroke Neg Hx     ROS- All systems are reviewed and negative except as per the HPI above  Physical Exam: Vitals:   09/10/17 0958  BP: 126/74  Pulse: 74  Weight: 198 lb (89.8 kg)  Height: 5\' 8"  (1.727 m)   Wt Readings from Last 3 Encounters:  09/10/17 198 lb (89.8 kg)  08/21/17 195 lb (88.5 kg)  07/28/17 192 lb 9.6 oz (87.4 kg)    Labs: Lab Results  Component Value Date   NA 143 06/16/2017   K 4.2 06/16/2017   CL 105 06/16/2017   CO2 31 06/16/2017   GLUCOSE 148 (H) 06/16/2017   BUN 15 06/16/2017   CREATININE 0.92 06/16/2017   CALCIUM 9.3 06/16/2017   Lab Results  Component Value Date   INR 1.75 (H) 01/12/2015   Lab Results  Component Value Date    CHOL 111 01/31/2017   HDL 47.20 01/31/2017   LDLCALC 43 02/05/2016   TRIG 222.0 (H) 01/31/2017     GEN- The patient is well appearing, alert and oriented x 3 today.   Head- normocephalic, atraumatic Eyes-  Sclera clear, conjunctiva pink Ears- hearing intact Oropharynx- clear Neck- supple, no JVP Lymph- no cervical lymphadenopathy Lungs- Clear to ausculation bilaterally, normal work of breathing Heart- irregular rate and rhythm, no murmurs,  rubs or gallops, PMI not laterally displaced GI- soft, NT, ND, + BS Extremities- no clubbing, cyanosis, or edema MS- no significant deformity or atrophy Skin- no rash or lesion Psych- euthymic mood, full affect Neuro- strength and sensation are intact  EKG-afib at 74 bpm, qrs int 92 ms, qtc 430 ms. Epic records reviewed Echo- 08/25/18-Study Conclusions  - Left ventricle: The cavity size was normal. Wall thickness was   increased in a pattern of mild LVH. Systolic function was normal.   The estimated ejection fraction was in the range of 55% to 60%.   Wall motion was normal; there were no regional wall motion   abnormalities. - Mitral valve: There was mild regurgitation.  Impressions:  - Normal LV systolic function; mild LVH; mild MR. -   Assessment and Plan: 1. Persistent afib  Apparent  onset  08/14/17, as pt follows his HR very closely with apple watch Will schedule cardioversion but will have to wait until he has taken xarelto without fail which should be around 09/04/17, unfortunately, he has missed another dose of eliquis   Now will schedule  for 09/16/17, reminded not to miss  any more doses of  DOAC   Continue xarelto with a chadsvasc score of at least 5 I will not increase Rate control as he is well rate controlled in office and pt is asymptomatic   F/u in one week after cardioversion  Butch Penny C. Carroll, [facility] [address] [place], [place] [address] [phone]

## 2017-09-10 NOTE — H&P (View-Only) (Signed)
Primary Care Physician: Midge Minium, MD Referring Physician: Dr. Martinique   Jacob Curry is a 77 y.o. male with a h/o CAD, DM, HTN, OSA uses an oral appliance, and h/o afib several years ago associated with an URI. He exercises 3x a week at cardiac rehab for years in the maintenance group. He watches his heart rate very carefully during exercise and has to document his pulse after recovery. !10/15/16, his HR was up to 150 bpm at the end of class. He has noted elevated heart rates intermittently since then. Before Thursday, his  rate rarely got above 90 bpm. He is asymptomatic with the  elevated heart rates.He is usually very careful not to miss doses of  eliquis,but did miss a dose on 12/11.He does not smoke, no alcohol or excessive caffeine. Exercises regularly and has recently lost around 4 lbs. Uses his oral appliance for OSA without fail. No identifiable trigger for afib. Ekg shows afib rate controlled at 80 bpm.  F/u in afib clinc for 3 weeks of uninterrupted eliquis with plans for cardioversion. He is still asymptomatic with rate controlled afib, but  would like to try restore SR with cardioversion. He unfortunately missed another dose of eliquis 12/22 and will be eligible for cardioversion after 09/14/17.Echo updated without any significant structural abnormalities  Today, he denies symptoms of palpitations, chest pain, shortness of breath, orthopnea, PND, lower extremity edema, dizziness, presyncope, syncope, or neurologic sequela. The patient is tolerating medications without difficulties and is otherwise without complaint today.   Past Medical History:  Diagnosis Date  . CAD (coronary artery disease)    Dr Peter Martinique  . Carotid stenosis   . Complication of anesthesia    " DIFFICULTY WAKING "  . DM (diabetes mellitus) ([place])   . ED (erectile dysfunction)   . HLD (hyperlipidemia)   . HTN (hypertension)   . Myocardial infarction ([place]) 2007  . Obesity   . OSA (obstructive  sleep apnea)    oral appliance, Dr Ron Parker  . Paroxysmal atrial fibrillation [facility])    Past Surgical History:  Procedure Laterality Date  . [place]  . APPENDECTOMY  1958  . CARDIAC CATHETERIZATION  2007 & 2011  . CARDIAC CATHETERIZATION N/A 01/13/2015   Procedure: Left Heart Cath and Coronary Angiography;  Surgeon: Peter M Martinique, MD;  Location: [facility];  Service: Cardiovascular;  Laterality: N/A;  . COLONOSCOPY  2002   negative; Dr Olevia Perches  . [place]  . TONSILLECTOMY      Current Outpatient Medications  Medication Sig Dispense Refill  . amLODipine (NORVASC) 2.5 MG tablet TAKE 1 TABLET EVERY DAY (NEED MD APPOINTMENT) 90 tablet 3  . apixaban (ELIQUIS) 5 MG TABS tablet Take 1 tablet (5 mg total) by mouth 2 (two) times daily. 180 tablet 1  . carvedilol (COREG) 12.5 MG tablet Take 1 tablet (12.5 mg total) by mouth 2 (two) times daily with a meal. 270 tablet 3  . EPINEPHrine 0.3 mg/0.3 mL IJ SOAJ injection Inject 0.3 mLs (0.3 mg total) into the muscle once. 1 Device 3  . fish oil-omega-3 fatty acids 1000 MG capsule Take 2 g by mouth daily.      Marland Kitchen ipratropium (ATROVENT) 0.06 % nasal spray Place 2 sprays into both nostrils 4 (four) times daily for 5 days. 15 mL 0  . losartan (COZAAR) 100 MG tablet Take 1 tablet (100 mg total) by mouth daily. 90 tablet 3  . metFORMIN (GLUCOPHAGE)  500 MG tablet TAKE 1 TABLET EVERY MORNING. 90 tablet 0  . Multiple Vitamin (MULTIVITAMIN) tablet Take 1 tablet by mouth daily.    . nitroGLYCERIN (NITROSTAT) 0.4 MG SL tablet Place 1 tablet (0.4 mg total) under the tongue every 5 (five) minutes as needed. 25 tablet 11  . Saw Palmetto, Serenoa repens, (SAW PALMETTO PO) Take 1 tablet by mouth daily.      . simvastatin (ZOCOR) 40 MG tablet TAKE 1 TABLET AT BEDTIME 90 tablet 3   No current facility-administered medications for this encounter.     Allergies  Allergen Reactions  . Acetaminophen Other (See Comments)    Drug induced  hepatitis  . Oxycodone-Acetaminophen Other (See Comments)    Drug induced hepatitis  . Flomax [Tamsulosin Hcl] Other (See Comments)    incontinence    Social History   Socioeconomic History  . Marital status: Married    Spouse name: Not on file  . Number of children: Not on file  . Years of education: Not on file  . Highest education level: Not on file  Social Needs  . Financial resource strain: Not on file  . Food insecurity - worry: Not on file  . Food insecurity - inability: Not on file  . Transportation needs - medical: Not on file  . Transportation needs - non-medical: Not on file  Occupational History  . Occupation: Art gallery manager / Haematologist  Tobacco Use  . Smoking status: Former Smoker    Packs/day: 1.00    Last attempt to quit: 09/02/1990    Years since quitting: 27.0  . Smokeless tobacco: Never Used  . Tobacco comment: onset age [age] up to 1 ppd  Substance and Sexual Activity  . Alcohol use: No  . Drug use: No  . Sexual activity: Not on file  Other Topics Concern  . Not on file  Social History Narrative  . Not on file    Family History  Problem Relation Age of Onset  . Diabetes Mother   . Heart attack Father         4 vessel CBAG @ 72  . Colon cancer Paternal Uncle   . Diabetes Brother   . Stroke Neg Hx     ROS- All systems are reviewed and negative except as per the HPI above  Physical Exam: Vitals:   09/10/17 0958  BP: 126/74  Pulse: 74  Weight: 198 lb (89.8 kg)  Height: 5\' 8"  (1.727 m)   Wt Readings from Last 3 Encounters:  09/10/17 198 lb (89.8 kg)  08/21/17 195 lb (88.5 kg)  07/28/17 192 lb 9.6 oz (87.4 kg)    Labs: Lab Results  Component Value Date   NA 143 06/16/2017   K 4.2 06/16/2017   CL 105 06/16/2017   CO2 31 06/16/2017   GLUCOSE 148 (H) 06/16/2017   BUN 15 06/16/2017   CREATININE 0.92 06/16/2017   CALCIUM 9.3 06/16/2017   Lab Results  Component Value Date   INR 1.75 (H) 01/12/2015   Lab Results  Component Value Date    CHOL 111 01/31/2017   HDL 47.20 01/31/2017   LDLCALC 43 02/05/2016   TRIG 222.0 (H) 01/31/2017     GEN- The patient is well appearing, alert and oriented x 3 today.   Head- normocephalic, atraumatic Eyes-  Sclera clear, conjunctiva pink Ears- hearing intact Oropharynx- clear Neck- supple, no JVP Lymph- no cervical lymphadenopathy Lungs- Clear to ausculation bilaterally, normal work of breathing Heart- irregular rate and rhythm, no murmurs,  rubs or gallops, PMI not laterally displaced GI- soft, NT, ND, + BS Extremities- no clubbing, cyanosis, or edema MS- no significant deformity or atrophy Skin- no rash or lesion Psych- euthymic mood, full affect Neuro- strength and sensation are intact  EKG-afib at 74 bpm, qrs int 92 ms, qtc 430 ms. Epic records reviewed Echo- 08/25/18-Study Conclusions  - Left ventricle: The cavity size was normal. Wall thickness was   increased in a pattern of mild LVH. Systolic function was normal.   The estimated ejection fraction was in the range of 55% to 60%.   Wall motion was normal; there were no regional wall motion   abnormalities. - Mitral valve: There was mild regurgitation.  Impressions:  - Normal LV systolic function; mild LVH; mild MR. -   Assessment and Plan: 1. Persistent afib  Apparent  onset  08/14/17, as pt follows his HR very closely with apple watch Will schedule cardioversion but will have to wait until he has taken xarelto without fail which should be around 09/04/17, unfortunately, he has missed another dose of eliquis   Now will schedule  for 09/16/17, reminded not to miss  any more doses of  DOAC   Continue xarelto with a chadsvasc score of at least 5 I will not increase Rate control as he is well rate controlled in office and pt is asymptomatic   F/u in one week after cardioversion  Butch Penny C. Eugen Jeansonne, [facility] [address] [place], [place] [address] [phone]

## 2017-09-11 ENCOUNTER — Encounter (HOSPITAL_COMMUNITY)
Admission: RE | Admit: 2017-09-11 | Discharge: 2017-09-11 | Disposition: A | Discharge status: Home / Self Care | Payer: Self-pay | Source: Ambulatory Visit | Attending: Cardiology | Admitting: Cardiology

## 2017-09-12 ENCOUNTER — Encounter (HOSPITAL_COMMUNITY)
Admission: RE | Admit: 2017-09-12 | Discharge: 2017-09-12 | Disposition: A | Discharge status: Home / Self Care | Payer: Self-pay | Source: Ambulatory Visit | Attending: Cardiology | Admitting: Cardiology

## 2017-09-15 ENCOUNTER — Other Ambulatory Visit: Payer: Self-pay

## 2017-09-15 MED ORDER — CARVEDILOL 12.5 MG PO TABS: ORAL_TABLET | ORAL | 3 refills | Status: DC

## 2017-09-16 ENCOUNTER — Other Ambulatory Visit: Payer: Self-pay

## 2017-09-16 ENCOUNTER — Encounter (HOSPITAL_COMMUNITY)
Admission: RE | Admit: 2017-09-16 | Discharge: 2017-09-16 | Disposition: A | Discharge status: Home / Self Care | Payer: Self-pay | Source: Ambulatory Visit | Attending: Cardiology | Admitting: Cardiology

## 2017-09-16 ENCOUNTER — Ambulatory Visit (HOSPITAL_COMMUNITY): Payer: Medicare Other | Admitting: Certified Registered"

## 2017-09-16 ENCOUNTER — Encounter (HOSPITAL_COMMUNITY)
Admission: RE | Disposition: A | Discharge status: Home / Self Care | Payer: Self-pay | Source: Ambulatory Visit | Attending: Cardiology

## 2017-09-16 ENCOUNTER — Encounter (HOSPITAL_COMMUNITY): Payer: Self-pay | Admitting: *Deleted

## 2017-09-16 ENCOUNTER — Ambulatory Visit (HOSPITAL_COMMUNITY)
Admission: RE | Admit: 2017-09-16 | Discharge: 2017-09-16 | Disposition: A | Discharge status: Home / Self Care | Payer: Medicare Other | Source: Ambulatory Visit | Attending: Cardiology | Admitting: Cardiology

## 2017-09-16 DIAGNOSIS — Z79899 Other long term (current) drug therapy: Secondary | ICD-10-CM | POA: Diagnosis not present

## 2017-09-16 DIAGNOSIS — I481 Persistent atrial fibrillation: Secondary | ICD-10-CM | POA: Diagnosis not present

## 2017-09-16 DIAGNOSIS — E119 Type 2 diabetes mellitus without complications: Secondary | ICD-10-CM | POA: Diagnosis not present

## 2017-09-16 DIAGNOSIS — I2510 Atherosclerotic heart disease of native coronary artery without angina pectoris: Secondary | ICD-10-CM | POA: Diagnosis not present

## 2017-09-16 DIAGNOSIS — I252 Old myocardial infarction: Secondary | ICD-10-CM | POA: Diagnosis not present

## 2017-09-16 DIAGNOSIS — Z7901 Long term (current) use of anticoagulants: Secondary | ICD-10-CM | POA: Diagnosis not present

## 2017-09-16 DIAGNOSIS — Z7984 Long term (current) use of oral hypoglycemic drugs: Secondary | ICD-10-CM | POA: Insufficient documentation

## 2017-09-16 DIAGNOSIS — Z87891 Personal history of nicotine dependence: Secondary | ICD-10-CM | POA: Insufficient documentation

## 2017-09-16 DIAGNOSIS — K439 Ventral hernia without obstruction or gangrene: Secondary | ICD-10-CM | POA: Diagnosis not present

## 2017-09-16 DIAGNOSIS — I480 Paroxysmal atrial fibrillation: Secondary | ICD-10-CM | POA: Diagnosis not present

## 2017-09-16 DIAGNOSIS — I4891 Unspecified atrial fibrillation: Secondary | ICD-10-CM | POA: Diagnosis not present

## 2017-09-16 DIAGNOSIS — I493 Ventricular premature depolarization: Secondary | ICD-10-CM | POA: Diagnosis not present

## 2017-09-16 DIAGNOSIS — I10 Essential (primary) hypertension: Secondary | ICD-10-CM | POA: Diagnosis not present

## 2017-09-16 DIAGNOSIS — I340 Nonrheumatic mitral (valve) insufficiency: Secondary | ICD-10-CM | POA: Diagnosis not present

## 2017-09-16 DIAGNOSIS — E785 Hyperlipidemia, unspecified: Secondary | ICD-10-CM | POA: Insufficient documentation

## 2017-09-16 DIAGNOSIS — I483 Typical atrial flutter: Secondary | ICD-10-CM

## 2017-09-16 DIAGNOSIS — G4733 Obstructive sleep apnea (adult) (pediatric): Secondary | ICD-10-CM | POA: Insufficient documentation

## 2017-09-16 HISTORY — PX: CARDIOVERSION: SHX1299

## 2017-09-16 LAB — GLUCOSE, CAPILLARY: Glucose-Capillary: 107 mg/dL — ABNORMAL HIGH (ref 65–99)

## 2017-09-16 SURGERY — CARDIOVERSION
Anesthesia: General

## 2017-09-16 MED ORDER — SODIUM CHLORIDE 0.9 % IV SOLN
INTRAVENOUS | Status: DC | PRN
  Administered 2017-09-16: 12:00:00 via INTRAVENOUS

## 2017-09-16 MED ORDER — LIDOCAINE 2% (20 MG/ML) 5 ML SYRINGE
INTRAMUSCULAR | Status: DC | PRN
  Administered 2017-09-16: 100 mg via INTRAVENOUS

## 2017-09-16 MED ORDER — PROPOFOL 10 MG/ML IV BOLUS
INTRAVENOUS | Status: DC | PRN
  Administered 2017-09-16: 80 mg via INTRAVENOUS

## 2017-09-16 NOTE — Anesthesia Postprocedure Evaluation (Signed)
Anesthesia Post Note  Patient: RYKIN ROUTE  Procedure(s) Performed: CARDIOVERSION (N/A )     Patient location during evaluation: PACU Anesthesia Type: General Level of consciousness: awake and alert Pain management: pain level controlled Vital Signs Assessment: post-procedure vital signs reviewed and stable Respiratory status: spontaneous breathing, nonlabored ventilation, respiratory function stable and patient connected to nasal cannula oxygen Cardiovascular status: blood pressure returned to baseline and stable Postop Assessment: no apparent nausea or vomiting Anesthetic complications: no    Last Vitals:  Vitals:   09/16/17 1245 09/16/17 1255  BP: 118/70 (!) 117/58  Pulse: 65 62  Resp: 16 18  Temp:    SpO2: 98% 97%    Last Pain:  Vitals:   09/16/17 1238  TempSrc: Oral                 Meleny Tregoning DAVID

## 2017-09-16 NOTE — Transfer of Care (Signed)
Immediate Anesthesia Transfer of Care Note  Patient: MUAZ SHOREY  Procedure(s) Performed: CARDIOVERSION (N/A )  Patient Location: Endoscopy Unit  Anesthesia Type:General  Level of Consciousness: drowsy and patient cooperative  Airway & Oxygen Therapy: Patient Spontanous Breathing and Patient connected to nasal cannula oxygen  Post-op Assessment: Report given to RN, Post -op Vital signs reviewed and stable and Patient moving all extremities  Post vital signs: Reviewed and stable  Last Vitals:  Vitals:   09/16/17 1038 09/16/17 1051  BP: (!) 157/88   Pulse: 78   Resp: (!) 22   Temp:  36.4 C  SpO2: 99%     Last Pain:  Vitals:   09/16/17 1051  TempSrc: Oral         Complications: No apparent anesthesia complications

## 2017-09-16 NOTE — Anesthesia Preprocedure Evaluation (Addendum)
Anesthesia Evaluation  Patient identified by MRN, date of birth, ID band Patient awake    Reviewed: Allergy & Precautions, NPO status , Patient's Chart, lab work & pertinent test results, reviewed documented beta blocker date and time   Airway Mallampati: II  TM Distance: >3 FB     Dental  (+) Teeth Intact, Dental Advisory Given   Pulmonary sleep apnea , former smoker,    Pulmonary exam normal        Cardiovascular hypertension, Pt. on medications and Pt. on home beta blockers + CAD, + Past MI and + Peripheral Vascular Disease  Normal cardiovascular exam     Neuro/Psych    GI/Hepatic   Endo/Other  diabetes, Type 2  Renal/GU      Musculoskeletal   Abdominal   Peds  Hematology   Anesthesia Other Findings   Reproductive/Obstetrics                            Anesthesia Physical Anesthesia Plan  ASA: III  Anesthesia Plan: General   Post-op Pain Management:    Induction:   PONV Risk Score and Plan: 2 and Treatment may vary due to age or medical condition  Airway Management Planned: Mask  Additional Equipment: None  Intra-op Plan:   Post-operative Plan:   Informed Consent: I have reviewed the patients History and Physical, chart, labs and discussed the procedure including the risks, benefits and alternatives for the proposed anesthesia with the patient or authorized representative who has indicated his/her understanding and acceptance.   Dental advisory given  Plan Discussed with: CRNA and Surgeon  Anesthesia Plan Comments:        Anesthesia Quick Evaluation

## 2017-09-16 NOTE — CV Procedure (Signed)
   Cardioversion Note  ERICKSON YAMASHIRO [id number] August 24, 1941  Procedure: DC Cardioversion Indications: atrial fibrillation  Procedure Details Consent: Obtained Time Out: Verified patient identification, verified procedure, site/side was marked, verified correct patient position, special equipment/implants available, Radiology Safety Procedures followed,  medications/allergies/relevent history reviewed, required imaging and test results available.  Performed  The patient has been on adequate anticoagulation.  The patient received IV propofol and lidocaine administered by anesthesia staff for sedation.  Synchronous cardioversion was performed at 120 joules.  The cardioversion was successful.  Complications: No apparent complications Patient did tolerate procedure well.  Ena Dawley, MD, [facility] 09/16/2017, 12:37 PM

## 2017-09-16 NOTE — Interval H&P Note (Signed)
History and Physical Interval Note:  09/16/2017 10:37 AM  [name]  has presented today for surgery, with the diagnosis of AFIB  The various methods of treatment have been discussed with the patient and family. After consideration of risks, benefits and other options for treatment, the patient has consented to  Procedure(s): CARDIOVERSION (N/A) as a surgical intervention .  The patient's history has been reviewed, patient examined, no change in status, stable for surgery.  I have reviewed the patient's chart and labs.  Questions were answered to the patient's satisfaction.     Jacob Curry

## 2017-09-17 ENCOUNTER — Encounter (HOSPITAL_COMMUNITY): Payer: Self-pay | Admitting: Cardiology

## 2017-09-18 ENCOUNTER — Encounter (HOSPITAL_COMMUNITY)
Admission: RE | Admit: 2017-09-18 | Discharge: 2017-09-18 | Disposition: A | Discharge status: Home / Self Care | Payer: Self-pay | Source: Ambulatory Visit | Attending: Cardiology | Admitting: Cardiology

## 2017-09-19 ENCOUNTER — Encounter (HOSPITAL_COMMUNITY)
Admission: RE | Admit: 2017-09-19 | Discharge: 2017-09-19 | Disposition: A | Discharge status: Home / Self Care | Payer: Self-pay | Source: Ambulatory Visit | Attending: Cardiology | Admitting: Cardiology

## 2017-09-23 ENCOUNTER — Encounter (HOSPITAL_COMMUNITY)
Admission: RE | Admit: 2017-09-23 | Discharge: 2017-09-23 | Disposition: A | Discharge status: Home / Self Care | Payer: Self-pay | Source: Ambulatory Visit | Attending: Cardiology | Admitting: Cardiology

## 2017-09-25 ENCOUNTER — Encounter (HOSPITAL_COMMUNITY)
Admission: RE | Admit: 2017-09-25 | Discharge: 2017-09-25 | Disposition: A | Discharge status: Home / Self Care | Payer: Self-pay | Source: Ambulatory Visit | Attending: Cardiology | Admitting: Cardiology

## 2017-09-26 ENCOUNTER — Encounter (HOSPITAL_COMMUNITY)
Admission: RE | Admit: 2017-09-26 | Discharge: 2017-09-26 | Disposition: A | Discharge status: Home / Self Care | Payer: Self-pay | Source: Ambulatory Visit | Attending: Cardiology | Admitting: Cardiology

## 2017-09-29 ENCOUNTER — Encounter (HOSPITAL_COMMUNITY): Payer: Self-pay | Admitting: Nurse Practitioner

## 2017-09-29 ENCOUNTER — Ambulatory Visit (HOSPITAL_COMMUNITY)
Admission: RE | Admit: 2017-09-29 | Discharge: 2017-09-29 | Disposition: A | Discharge status: Home / Self Care | Payer: Medicare Other | Source: Ambulatory Visit | Attending: Nurse Practitioner | Admitting: Nurse Practitioner

## 2017-09-29 VITALS — BP 124/66 | HR 66 | Ht 68.0 in | Wt 198.4 lb

## 2017-09-29 DIAGNOSIS — Z833 Family history of diabetes mellitus: Secondary | ICD-10-CM | POA: Diagnosis not present

## 2017-09-29 DIAGNOSIS — E119 Type 2 diabetes mellitus without complications: Secondary | ICD-10-CM | POA: Diagnosis not present

## 2017-09-29 DIAGNOSIS — Z7901 Long term (current) use of anticoagulants: Secondary | ICD-10-CM | POA: Insufficient documentation

## 2017-09-29 DIAGNOSIS — Z888 Allergy status to other drugs, medicaments and biological substances status: Secondary | ICD-10-CM | POA: Insufficient documentation

## 2017-09-29 DIAGNOSIS — G4733 Obstructive sleep apnea (adult) (pediatric): Secondary | ICD-10-CM | POA: Diagnosis not present

## 2017-09-29 DIAGNOSIS — E785 Hyperlipidemia, unspecified: Secondary | ICD-10-CM | POA: Insufficient documentation

## 2017-09-29 DIAGNOSIS — I252 Old myocardial infarction: Secondary | ICD-10-CM | POA: Diagnosis not present

## 2017-09-29 DIAGNOSIS — E669 Obesity, unspecified: Secondary | ICD-10-CM | POA: Insufficient documentation

## 2017-09-29 DIAGNOSIS — I481 Persistent atrial fibrillation: Secondary | ICD-10-CM | POA: Diagnosis not present

## 2017-09-29 DIAGNOSIS — I2510 Atherosclerotic heart disease of native coronary artery without angina pectoris: Secondary | ICD-10-CM | POA: Insufficient documentation

## 2017-09-29 DIAGNOSIS — Z8249 Family history of ischemic heart disease and other diseases of the circulatory system: Secondary | ICD-10-CM | POA: Diagnosis not present

## 2017-09-29 DIAGNOSIS — Z87891 Personal history of nicotine dependence: Secondary | ICD-10-CM | POA: Insufficient documentation

## 2017-09-29 DIAGNOSIS — Z7984 Long term (current) use of oral hypoglycemic drugs: Secondary | ICD-10-CM | POA: Insufficient documentation

## 2017-09-29 DIAGNOSIS — I480 Paroxysmal atrial fibrillation: Secondary | ICD-10-CM | POA: Diagnosis not present

## 2017-09-29 DIAGNOSIS — Z885 Allergy status to narcotic agent status: Secondary | ICD-10-CM | POA: Diagnosis not present

## 2017-09-29 DIAGNOSIS — Z6830 Body mass index (BMI) 30.0-30.9, adult: Secondary | ICD-10-CM | POA: Diagnosis not present

## 2017-09-29 DIAGNOSIS — Z79899 Other long term (current) drug therapy: Secondary | ICD-10-CM | POA: Insufficient documentation

## 2017-09-29 DIAGNOSIS — I10 Essential (primary) hypertension: Secondary | ICD-10-CM | POA: Diagnosis not present

## 2017-09-29 NOTE — Progress Notes (Signed)
Primary Care Physician: Midge Minium, MD Referring Physician: Dr. Martinique   Ozias Jacob Curry is a 77 y.o. male with a h/o CAD, DM, HTN, OSA uses an oral appliance, and h/o afib several years ago associated with an URI. He exercises 3x a week at cardiac rehab for years in the maintenance group. He watches his heart rate very carefully during exercise and has to document his pulse after recovery. !10/15/16, his HR was up to 150 bpm at the end of class. He has noted elevated heart rates intermittently since then. Before Thursday, his  rate rarely got above 90 bpm. He is asymptomatic with the  elevated heart rates.He is usually very careful not to miss doses of  eliquis,but did miss a dose on 12/11.He does not smoke, no alcohol or excessive caffeine. Exercises regularly and has recently lost around 4 lbs. Uses his oral appliance for OSA without fail. No identifiable trigger for afib, other than recent bronchitis and prednisone use a couple of weeks before afib.Marthann Schiller shows afib rate controlled at 80 bpm.  F/u in afib clinc for 3 weeks of uninterrupted eliquis with plans for cardioversion. He is still asymptomatic with rate controlled afib, but  would like to try restore SR with cardioversion. He unfortunately missed another dose of eliquis 12/22 and will be eligible for cardioversion after 09/14/17.Echo updated without any significant structural abnormalities.  F/u cardioversion, 1/28, he had successful cardioversion and is continuing to stay in [place]. He feels well.  Today, he denies symptoms of palpitations, chest pain, shortness of breath, orthopnea, PND, lower extremity edema, dizziness, presyncope, syncope, or neurologic sequela. The patient is tolerating medications without difficulties and is otherwise without complaint today.   Past Medical History:  Diagnosis Date  . CAD (coronary artery disease)    Dr Peter Martinique  . Carotid stenosis   . Complication of anesthesia    " DIFFICULTY WAKING  "  . DM (diabetes mellitus) ([place])   . ED (erectile dysfunction)   . HLD (hyperlipidemia)   . HTN (hypertension)   . Myocardial infarction ([place]) 2007  . Obesity   . OSA (obstructive sleep apnea)    oral appliance, Dr Ron Parker  . Paroxysmal atrial fibrillation [facility])    Past Surgical History:  Procedure Laterality Date  . [place]  . APPENDECTOMY  1958  . CARDIAC CATHETERIZATION  2007 & 2011  . CARDIAC CATHETERIZATION N/A 01/13/2015   Procedure: Left Heart Cath and Coronary Angiography;  Surgeon: Peter M Martinique, MD;  Location: [place] [facility];  Service: Cardiovascular;  Laterality: N/A;  . CARDIOVERSION N/A 09/16/2017   Procedure: CARDIOVERSION;  Surgeon: Dorothy Spark, MD;  Location: [place];  Service: Cardiovascular;  Laterality: N/A;  . COLONOSCOPY  2002   negative; Dr Olevia Perches  . [place]  . TONSILLECTOMY      Current Outpatient Medications  Medication Sig Dispense Refill  . amLODipine (NORVASC) 2.5 MG tablet TAKE 1 TABLET EVERY DAY (NEED MD APPOINTMENT) 90 tablet 3  . apixaban (ELIQUIS) 5 MG TABS tablet Take 1 tablet (5 mg total) by mouth 2 (two) times daily. 180 tablet 1  . carvedilol (COREG) 12.5 MG tablet Take 1&1/2 tablets twice a day 360 tablet 3  . EPINEPHrine 0.3 mg/0.3 mL IJ SOAJ injection Inject 0.3 mLs (0.3 mg total) into the muscle once. 1 Device 3  . fish oil-omega-3 fatty acids 1000 MG capsule Take 2 g by mouth daily.      Marland Kitchen  ipratropium (ATROVENT) 0.06 % nasal spray Place 2 sprays into both nostrils 4 (four) times daily for 5 days. 15 mL 0  . losartan (COZAAR) 100 MG tablet Take 1 tablet (100 mg total) by mouth daily. 90 tablet 3  . metFORMIN (GLUCOPHAGE) 500 MG tablet TAKE 1 TABLET EVERY MORNING. 90 tablet 0  . Multiple Vitamin (MULTIVITAMIN) tablet Take 1 tablet by mouth daily.    . nitroGLYCERIN (NITROSTAT) 0.4 MG SL tablet Place 1 tablet (0.4 mg total) under the tongue every 5 (five) minutes as needed. 25 tablet 11  . Saw  Palmetto, Serenoa repens, (SAW PALMETTO PO) Take 1 tablet by mouth as needed (prostate).     . simvastatin (ZOCOR) 40 MG tablet TAKE 1 TABLET AT BEDTIME 90 tablet 3   No current facility-administered medications for this encounter.     Allergies  Allergen Reactions  . Acetaminophen Other (See Comments)    Drug induced hepatitis  . Oxycodone-Acetaminophen Other (See Comments)    Drug induced hepatitis  . Flomax [Tamsulosin Hcl] Other (See Comments)    incontinence    Social History   Socioeconomic History  . Marital status: Married    Spouse name: Not on file  . Number of children: Not on file  . Years of education: Not on file  . Highest education level: Not on file  Social Needs  . Financial resource strain: Not on file  . Food insecurity - worry: Not on file  . Food insecurity - inability: Not on file  . Transportation needs - medical: Not on file  . Transportation needs - non-medical: Not on file  Occupational History  . Occupation: Art gallery manager / Haematologist  Tobacco Use  . Smoking status: Former Smoker    Packs/day: 1.00    Last attempt to quit: 09/02/1990    Years since quitting: 27.0  . Smokeless tobacco: Never Used  . Tobacco comment: onset age [age] up to 1 ppd  Substance and Sexual Activity  . Alcohol use: No  . Drug use: No  . Sexual activity: Not on file  Other Topics Concern  . Not on file  Social History Narrative  . Not on file    Family History  Problem Relation Age of Onset  . Diabetes Mother   . Heart attack Father         4 vessel CBAG @ 60  . Colon cancer Paternal Uncle   . Diabetes Brother   . Stroke Neg Hx     ROS- All systems are reviewed and negative except as per the HPI above  Physical Exam: Vitals:   09/29/17 0902  BP: 124/66  Pulse: 66  Weight: 198 lb 6.4 oz (90 kg)  Height: 5\' 8"  (1.727 m)   Wt Readings from Last 3 Encounters:  09/29/17 198 lb 6.4 oz (90 kg)  09/16/17 198 lb (89.8 kg)  09/10/17 198 lb (89.8 kg)     Labs: Lab Results  Component Value Date   NA 140 09/10/2017   K 4.1 09/10/2017   CL 109 09/10/2017   CO2 24 09/10/2017   GLUCOSE 134 (H) 09/10/2017   BUN 15 09/10/2017   CREATININE 0.98 09/10/2017   CALCIUM 9.2 09/10/2017   Lab Results  Component Value Date   INR 1.75 (H) 01/12/2015   Lab Results  Component Value Date   CHOL 111 01/31/2017   HDL 47.20 01/31/2017   LDLCALC 43 02/05/2016   TRIG 222.0 (H) 01/31/2017     GEN- The patient is  well appearing, alert and oriented x 3 today.   Head- normocephalic, atraumatic Eyes-  Sclera clear, conjunctiva pink Ears- hearing intact Oropharynx- clear Neck- supple, no JVP Lymph- no cervical lymphadenopathy Lungs- Clear to ausculation bilaterally, normal work of breathing Heart-  regular rate and rhythm, no murmurs, rubs or gallops, PMI not laterally displaced GI- soft, NT, ND, + BS Extremities- no clubbing, cyanosis, or edema MS- no significant deformity or atrophy Skin- no rash or lesion Psych- euthymic mood, full affect Neuro- strength and sensation are intact  EKG-NSR at 66 bpm, pr int 162 ms, qrs 82 ms, qtc 415 ms Epic records reviewed Echo- 08/25/18-Study Conclusions  - Left ventricle: The cavity size was normal. Wall thickness was   increased in a pattern of mild LVH. Systolic function was normal.   The estimated ejection fraction was in the range of 55% to 60%.   Wall motion was normal; there were no regional wall motion   abnormalities. - Mitral valve: There was mild regurgitation.  Impressions:  - Normal LV systolic function; mild LVH; mild MR. -   Assessment and Plan: 1. Persistent afib  Successful cardioversion He continues in SR and feels well He will continue to track his heart rhythm with his watch and with cardiac rehab Continue xarelto with a chadsvasc score of at least 5, asked not to miss dose  2. CAD No anginal symptoms Per Dr. Martinique  Continue cardiac rehab  3.  HTN Stable  F/u with Dr. Martinique as scheduled Afib clinic as needed  Geroge Baseman. [place], [facility] [address] [place], [place] [address] [phone]

## 2017-09-30 ENCOUNTER — Encounter (HOSPITAL_COMMUNITY)
Admission: RE | Admit: 2017-09-30 | Discharge: 2017-09-30 | Disposition: A | Discharge status: Home / Self Care | Payer: Self-pay | Source: Ambulatory Visit | Attending: Cardiology | Admitting: Cardiology

## 2017-10-02 ENCOUNTER — Encounter (HOSPITAL_COMMUNITY): Payer: Self-pay

## 2017-10-03 ENCOUNTER — Encounter (HOSPITAL_COMMUNITY): Payer: Self-pay

## 2017-10-03 DIAGNOSIS — E119 Type 2 diabetes mellitus without complications: Secondary | ICD-10-CM | POA: Insufficient documentation

## 2017-10-03 DIAGNOSIS — I208 Other forms of angina pectoris: Secondary | ICD-10-CM | POA: Insufficient documentation

## 2017-10-03 DIAGNOSIS — E785 Hyperlipidemia, unspecified: Secondary | ICD-10-CM | POA: Insufficient documentation

## 2017-10-06 ENCOUNTER — Ambulatory Visit (INDEPENDENT_AMBULATORY_CARE_PROVIDER_SITE_OTHER): Payer: Medicare Other | Admitting: Family Medicine

## 2017-10-06 ENCOUNTER — Encounter: Payer: Self-pay | Admitting: Family Medicine

## 2017-10-06 ENCOUNTER — Other Ambulatory Visit: Payer: Self-pay

## 2017-10-06 VITALS — BP 132/83 | HR 65 | Temp 97.9°F | Resp 16 | Ht 68.0 in | Wt 195.2 lb

## 2017-10-06 DIAGNOSIS — B9689 Other specified bacterial agents as the cause of diseases classified elsewhere: Secondary | ICD-10-CM

## 2017-10-06 DIAGNOSIS — J208 Acute bronchitis due to other specified organisms: Secondary | ICD-10-CM

## 2017-10-06 DIAGNOSIS — I10 Essential (primary) hypertension: Secondary | ICD-10-CM

## 2017-10-06 DIAGNOSIS — E1159 Type 2 diabetes mellitus with other circulatory complications: Secondary | ICD-10-CM

## 2017-10-06 DIAGNOSIS — E782 Mixed hyperlipidemia: Secondary | ICD-10-CM

## 2017-10-06 LAB — BASIC METABOLIC PANEL
BUN: 16 mg/dL (ref 6–23)
CO2: 29 mEq/L (ref 19–32)
Calcium: 8.8 mg/dL (ref 8.4–10.5)
Chloride: 105 mEq/L (ref 96–112)
Creatinine, Ser: 0.97 mg/dL (ref 0.40–1.50)
GFR: 79.82 mL/min (ref >60.00)
Glucose, Bld: 145 mg/dL — ABNORMAL HIGH (ref 70–99)
Potassium: 4 mEq/L (ref 3.5–5.1)
Sodium: 143 mEq/L (ref 135–145)

## 2017-10-06 LAB — LIPID PANEL
Cholesterol: 80 mg/dL (ref 0–200)
HDL: 39.8 mg/dL (ref >39.00)
LDL Cholesterol: 10 mg/dL (ref 0–99)
NonHDL: 40.34
Total CHOL/HDL Ratio: 2
Triglycerides: 154 mg/dL — ABNORMAL HIGH (ref 0.0–149.0)
VLDL: 30.8 mg/dL (ref 0.0–40.0)

## 2017-10-06 LAB — HEPATIC FUNCTION PANEL
ALT: 28 U/L (ref 0–53)
AST: 25 U/L (ref 0–37)
Albumin: 3.9 g/dL (ref 3.5–5.2)
Alkaline Phosphatase: 75 U/L (ref 39–117)
Bilirubin, Direct: 0.1 mg/dL (ref 0.0–0.3)
Total Bilirubin: 0.6 mg/dL (ref 0.2–1.2)
Total Protein: 6.5 g/dL (ref 6.0–8.3)

## 2017-10-06 LAB — CBC WITH DIFFERENTIAL/PLATELET
Basophils Absolute: 0 10*3/uL (ref 0.0–0.1)
Basophils Relative: 0.5 % (ref 0.0–3.0)
Eosinophils Absolute: 0.1 10*3/uL (ref 0.0–0.7)
Eosinophils Relative: 1.1 % (ref 0.0–5.0)
HCT: 40.9 % (ref 39.0–52.0)
Hemoglobin: 13.8 g/dL (ref 13.0–17.0)
Lymphocytes Relative: 22.5 % (ref 12.0–46.0)
Lymphs Abs: 2.1 10*3/uL (ref 0.7–4.0)
MCHC: 33.7 g/dL (ref 30.0–36.0)
MCV: 87.8 fl (ref 78.0–100.0)
Monocytes Absolute: 0.9 10*3/uL (ref 0.1–1.0)
Monocytes Relative: 10.1 % (ref 3.0–12.0)
Neutro Abs: 6 10*3/uL (ref 1.4–7.7)
Neutrophils Relative %: 65.8 % (ref 43.0–77.0)
Platelets: 214 10*3/uL (ref 150.0–400.0)
RBC: 4.65 Mil/uL (ref 4.22–5.81)
RDW: 14.4 % (ref 11.5–15.5)
WBC: 9.1 10*3/uL (ref 4.0–10.5)

## 2017-10-06 LAB — TSH: TSH: 1.21 u[IU]/mL (ref 0.35–4.50)

## 2017-10-06 LAB — HEMOGLOBIN A1C: Hgb A1c MFr Bld: 7 % — ABNORMAL HIGH (ref 4.6–6.5)

## 2017-10-06 MED ORDER — GUAIFENESIN-CODEINE 100-10 MG/5ML PO SYRP: 10.0000 mL | ORAL_SOLUTION | Freq: Three times a day (TID) | ORAL | 0 refills | Status: DC | PRN

## 2017-10-06 MED ORDER — AZITHROMYCIN 250 MG PO TABS: ORAL_TABLET | ORAL | 0 refills | Status: DC

## 2017-10-06 NOTE — Patient Instructions (Signed)
Schedule your Medicare Wellness Visit after 6/2 and a follow up w/ me at the same time [facility] notify you of your lab results and make any changes if needed START the Zpack as directed Use the cough syrup as needed- may cause drowsiness but will not interfere w/ Afib Drink plenty of fluids REST! Call with any questions or concerns Hang in there!!

## 2017-10-06 NOTE — Progress Notes (Signed)
   Subjective:    Patient ID: Jacob Curry, male    DOB: [birth year], 77 y.o.   MRN: [id number]  HPI DM- chronic problem, on Metformin 500mg  daily.  On ARB for renal protection.  UTD on eye exam and foot exam.  Denies symptomatic lows.  No numbness/tingling of hands/feet.  HTN- chronic problem.  Adequate control on Amlodipine 2.5mg  daily, Coreg 18.75 mg BID, Losartan 100mg  daily.  No CP, SOB, HAs, visual changes, edema.  Hyperlipidemia- chronic problem, on Simvastatin 40mg  nightly.  No abd pain, N/V.  'i've got bronchitis again'- sxs started 6 days ago.  No fevers.  Mild sinus pressure- using Neti Pot.  No ear pain.  No N/V.  No tooth pain.  Cough is not productive.  No SOB.  Mild wheezing.  Pt reports his respiratory illnesses frequently trigger Afib.  Pt was given prednisone injxn at last sick visit which he was told by cards triggered Afib.  Review of Systems For ROS see HPI     Objective:   Physical Exam  Constitutional: He is oriented to person, place, and time. He appears well-developed and well-nourished. No distress.  HENT:  Head: Normocephalic and atraumatic.  Right Ear: Tympanic membrane normal.  Left Ear: Tympanic membrane normal.  Nose: No mucosal edema or rhinorrhea. Right sinus exhibits no maxillary sinus tenderness and no frontal sinus tenderness. Left sinus exhibits no maxillary sinus tenderness and no frontal sinus tenderness.  Mouth/Throat: Mucous membranes are normal. No oropharyngeal exudate, posterior oropharyngeal edema or posterior oropharyngeal erythema.  Eyes: Conjunctivae and EOM are normal. Pupils are equal, round, and reactive to light.  Neck: Normal range of motion. Neck supple.  Cardiovascular: Normal rate, regular rhythm and normal heart sounds.  Pulmonary/Chest: Effort normal. No respiratory distress. He has no wheezes.  + hacking cough Decreased air movement diffusely- pt refuses albuterol tx due to risk of Afib  Abdominal: Soft. Bowel sounds are  normal. He exhibits no distension. There is no tenderness. There is no rebound and no guarding.  Lymphadenopathy:    He has no cervical adenopathy.  Neurological: He is alert and oriented to person, place, and time.  Skin: Skin is warm and dry.  Psychiatric: He has a normal mood and affect. His behavior is normal. Thought content normal.  Vitals reviewed.         Assessment & Plan:  Acute bronchitis- new.  Pt has hx of this.  Coughing fits will frequently tip him into Afib.  Last episode of this, he went into Afib w/ Prednisone.  Will not do albuterol tx today due to risk of Afib.  Start abx and cough meds.  Reviewed supportive care and red flags that should prompt return.  Pt expressed understanding and is in agreement w/ plan.

## 2017-10-07 ENCOUNTER — Other Ambulatory Visit: Payer: Self-pay | Admitting: General Practice

## 2017-10-07 ENCOUNTER — Encounter (HOSPITAL_COMMUNITY): Payer: Self-pay

## 2017-10-07 MED ORDER — METFORMIN HCL 500 MG PO TABS: 500.0000 mg | ORAL_TABLET | Freq: Two times a day (BID) | ORAL | 0 refills | Status: DC

## 2017-10-07 NOTE — Assessment & Plan Note (Signed)
Chronic problem.  Tolerating statin w/o difficulty.  Check labs.  Adjust meds prn  

## 2017-10-07 NOTE — Assessment & Plan Note (Signed)
Chronic problem.  Adequate control.  Asymptomatic.  Check labs.  No anticipated med changes 

## 2017-10-07 NOTE — Assessment & Plan Note (Signed)
Chronic problem . Tolerating Metformin w/o difficulty.  On ARB for renal protection, UTD on foot exam, eye exam.  Stressed need for healthy diet and regular exercise.  Check labs.  Adjust meds prn

## 2017-10-09 ENCOUNTER — Other Ambulatory Visit: Payer: Self-pay | Admitting: Cardiology

## 2017-10-09 ENCOUNTER — Encounter (HOSPITAL_COMMUNITY): Payer: Self-pay

## 2017-10-10 ENCOUNTER — Encounter (HOSPITAL_COMMUNITY): Payer: Self-pay

## 2017-10-14 ENCOUNTER — Encounter (HOSPITAL_COMMUNITY): Payer: Self-pay

## 2017-10-16 ENCOUNTER — Encounter (HOSPITAL_COMMUNITY)
Admission: RE | Admit: 2017-10-16 | Discharge: 2017-10-16 | Disposition: A | Discharge status: Home / Self Care | Payer: Medicare Other | Source: Ambulatory Visit | Attending: Cardiology | Admitting: Cardiology

## 2017-10-17 ENCOUNTER — Encounter (HOSPITAL_COMMUNITY)
Admission: RE | Admit: 2017-10-17 | Discharge: 2017-10-17 | Disposition: A | Discharge status: Home / Self Care | Payer: Self-pay | Source: Ambulatory Visit | Attending: Cardiology | Admitting: Cardiology

## 2017-10-21 ENCOUNTER — Encounter (HOSPITAL_COMMUNITY)
Admission: RE | Admit: 2017-10-21 | Discharge: 2017-10-21 | Disposition: A | Discharge status: Home / Self Care | Payer: Medicare Other | Source: Ambulatory Visit | Attending: Cardiology | Admitting: Cardiology

## 2017-10-23 ENCOUNTER — Encounter (HOSPITAL_COMMUNITY)
Admission: RE | Admit: 2017-10-23 | Discharge: 2017-10-23 | Disposition: A | Discharge status: Home / Self Care | Payer: Medicare Other | Source: Ambulatory Visit | Attending: Cardiology | Admitting: Cardiology

## 2017-10-24 ENCOUNTER — Encounter (HOSPITAL_COMMUNITY)
Admission: RE | Admit: 2017-10-24 | Discharge: 2017-10-24 | Disposition: A | Discharge status: Home / Self Care | Payer: Medicare Other | Source: Ambulatory Visit | Attending: Cardiology | Admitting: Cardiology

## 2017-10-28 ENCOUNTER — Encounter (HOSPITAL_COMMUNITY)
Admission: RE | Admit: 2017-10-28 | Discharge: 2017-10-28 | Disposition: A | Discharge status: Home / Self Care | Payer: Medicare Other | Source: Ambulatory Visit | Attending: Cardiology | Admitting: Cardiology

## 2017-10-30 ENCOUNTER — Encounter (HOSPITAL_COMMUNITY)
Admission: RE | Admit: 2017-10-30 | Discharge: 2017-10-30 | Disposition: A | Discharge status: Home / Self Care | Payer: Medicare Other | Source: Ambulatory Visit | Attending: Cardiology | Admitting: Cardiology

## 2017-10-31 ENCOUNTER — Encounter (HOSPITAL_COMMUNITY)
Admission: RE | Admit: 2017-10-31 | Discharge: 2017-10-31 | Disposition: A | Discharge status: Home / Self Care | Payer: Medicare Other | Source: Ambulatory Visit | Attending: Cardiology | Admitting: Cardiology

## 2017-10-31 DIAGNOSIS — E119 Type 2 diabetes mellitus without complications: Secondary | ICD-10-CM | POA: Insufficient documentation

## 2017-10-31 DIAGNOSIS — I208 Other forms of angina pectoris: Secondary | ICD-10-CM | POA: Insufficient documentation

## 2017-10-31 DIAGNOSIS — E785 Hyperlipidemia, unspecified: Secondary | ICD-10-CM | POA: Insufficient documentation

## 2017-11-04 ENCOUNTER — Telehealth: Payer: Self-pay | Admitting: *Deleted

## 2017-11-04 ENCOUNTER — Other Ambulatory Visit: Payer: Self-pay | Admitting: Pharmacist

## 2017-11-04 ENCOUNTER — Encounter (HOSPITAL_COMMUNITY)
Admission: RE | Admit: 2017-11-04 | Discharge: 2017-11-04 | Disposition: A | Discharge status: Home / Self Care | Payer: Medicare Other | Source: Ambulatory Visit | Attending: Cardiology | Admitting: Cardiology

## 2017-11-04 MED ORDER — APIXABAN 5 MG PO TABS: 5.0000 mg | ORAL_TABLET | Freq: Two times a day (BID) | ORAL | 1 refills | Status: DC

## 2017-11-04 NOTE — Telephone Encounter (Signed)
error 

## 2017-11-06 ENCOUNTER — Encounter (HOSPITAL_COMMUNITY)
Admission: RE | Admit: 2017-11-06 | Discharge: 2017-11-06 | Disposition: A | Discharge status: Home / Self Care | Payer: Medicare Other | Source: Ambulatory Visit | Attending: Cardiology | Admitting: Cardiology

## 2017-11-07 ENCOUNTER — Encounter (HOSPITAL_COMMUNITY)
Admission: RE | Admit: 2017-11-07 | Discharge: 2017-11-07 | Disposition: A | Discharge status: Home / Self Care | Payer: Medicare Other | Source: Ambulatory Visit | Attending: Cardiology | Admitting: Cardiology

## 2017-11-11 ENCOUNTER — Encounter (HOSPITAL_COMMUNITY): Payer: Self-pay

## 2017-11-13 ENCOUNTER — Encounter (HOSPITAL_COMMUNITY)
Admission: RE | Admit: 2017-11-13 | Discharge: 2017-11-13 | Disposition: A | Discharge status: Home / Self Care | Payer: Medicare Other | Source: Ambulatory Visit | Attending: Cardiology | Admitting: Cardiology

## 2017-11-14 ENCOUNTER — Encounter (HOSPITAL_COMMUNITY)
Admission: RE | Admit: 2017-11-14 | Discharge: 2017-11-14 | Disposition: A | Discharge status: Home / Self Care | Payer: Medicare Other | Source: Ambulatory Visit | Attending: Cardiology | Admitting: Cardiology

## 2017-11-18 ENCOUNTER — Encounter (HOSPITAL_COMMUNITY)
Admission: RE | Admit: 2017-11-18 | Discharge: 2017-11-18 | Disposition: A | Discharge status: Home / Self Care | Payer: Medicare Other | Source: Ambulatory Visit | Attending: Cardiology | Admitting: Cardiology

## 2017-11-20 ENCOUNTER — Encounter (HOSPITAL_COMMUNITY)
Admission: RE | Admit: 2017-11-20 | Discharge: 2017-11-20 | Disposition: A | Discharge status: Home / Self Care | Payer: Medicare Other | Source: Ambulatory Visit | Attending: Cardiology | Admitting: Cardiology

## 2017-11-20 DIAGNOSIS — E119 Type 2 diabetes mellitus without complications: Secondary | ICD-10-CM | POA: Diagnosis not present

## 2017-11-20 DIAGNOSIS — Z7984 Long term (current) use of oral hypoglycemic drugs: Secondary | ICD-10-CM | POA: Diagnosis not present

## 2017-11-20 DIAGNOSIS — Z961 Presence of intraocular lens: Secondary | ICD-10-CM | POA: Diagnosis not present

## 2017-11-20 LAB — HM DIABETES EYE EXAM

## 2017-11-21 ENCOUNTER — Encounter (HOSPITAL_COMMUNITY)
Admission: RE | Admit: 2017-11-21 | Discharge: 2017-11-21 | Disposition: A | Discharge status: Home / Self Care | Payer: Medicare Other | Source: Ambulatory Visit | Attending: Cardiology | Admitting: Cardiology

## 2017-11-25 ENCOUNTER — Encounter (HOSPITAL_COMMUNITY)
Admission: RE | Admit: 2017-11-25 | Discharge: 2017-11-25 | Disposition: A | Discharge status: Home / Self Care | Payer: Self-pay | Source: Ambulatory Visit | Attending: Cardiology | Admitting: Cardiology

## 2017-11-25 ENCOUNTER — Encounter: Payer: Self-pay | Admitting: General Practice

## 2017-11-27 ENCOUNTER — Encounter (HOSPITAL_COMMUNITY)
Admission: RE | Admit: 2017-11-27 | Discharge: 2017-11-27 | Disposition: A | Discharge status: Home / Self Care | Payer: Medicare Other | Source: Ambulatory Visit | Attending: Cardiology | Admitting: Cardiology

## 2017-11-28 ENCOUNTER — Encounter (HOSPITAL_COMMUNITY)
Admission: RE | Admit: 2017-11-28 | Discharge: 2017-11-28 | Disposition: A | Discharge status: Home / Self Care | Payer: Medicare Other | Source: Ambulatory Visit | Attending: Cardiology | Admitting: Cardiology

## 2017-12-02 ENCOUNTER — Encounter (HOSPITAL_COMMUNITY)
Admission: RE | Admit: 2017-12-02 | Discharge: 2017-12-02 | Disposition: A | Discharge status: Home / Self Care | Payer: Medicare Other | Source: Ambulatory Visit | Attending: Cardiology | Admitting: Cardiology

## 2017-12-02 DIAGNOSIS — E119 Type 2 diabetes mellitus without complications: Secondary | ICD-10-CM | POA: Insufficient documentation

## 2017-12-02 DIAGNOSIS — I208 Other forms of angina pectoris: Secondary | ICD-10-CM | POA: Insufficient documentation

## 2017-12-02 DIAGNOSIS — E785 Hyperlipidemia, unspecified: Secondary | ICD-10-CM | POA: Insufficient documentation

## 2017-12-04 ENCOUNTER — Encounter (HOSPITAL_COMMUNITY)
Admission: RE | Admit: 2017-12-04 | Discharge: 2017-12-04 | Disposition: A | Discharge status: Home / Self Care | Payer: Medicare Other | Source: Ambulatory Visit | Attending: Cardiology | Admitting: Cardiology

## 2017-12-05 ENCOUNTER — Encounter (HOSPITAL_COMMUNITY)
Admission: RE | Admit: 2017-12-05 | Discharge: 2017-12-05 | Disposition: A | Discharge status: Home / Self Care | Payer: Medicare Other | Source: Ambulatory Visit | Attending: Cardiology | Admitting: Cardiology

## 2017-12-09 ENCOUNTER — Encounter (HOSPITAL_COMMUNITY)
Admission: RE | Admit: 2017-12-09 | Discharge: 2017-12-09 | Disposition: A | Discharge status: Home / Self Care | Payer: Medicare Other | Source: Ambulatory Visit | Attending: Cardiology | Admitting: Cardiology

## 2017-12-11 ENCOUNTER — Encounter (HOSPITAL_COMMUNITY)
Admission: RE | Admit: 2017-12-11 | Discharge: 2017-12-11 | Disposition: A | Discharge status: Home / Self Care | Payer: Medicare Other | Source: Ambulatory Visit | Attending: Cardiology | Admitting: Cardiology

## 2017-12-12 ENCOUNTER — Encounter (HOSPITAL_COMMUNITY)
Admission: RE | Admit: 2017-12-12 | Discharge: 2017-12-12 | Disposition: A | Discharge status: Home / Self Care | Payer: Self-pay | Source: Ambulatory Visit | Attending: Cardiology | Admitting: Cardiology

## 2017-12-16 ENCOUNTER — Encounter (HOSPITAL_COMMUNITY)
Admission: RE | Admit: 2017-12-16 | Discharge: 2017-12-16 | Disposition: A | Discharge status: Home / Self Care | Payer: Medicare Other | Source: Ambulatory Visit | Attending: Cardiology | Admitting: Cardiology

## 2017-12-18 ENCOUNTER — Encounter (HOSPITAL_COMMUNITY)
Admission: RE | Admit: 2017-12-18 | Discharge: 2017-12-18 | Disposition: A | Discharge status: Home / Self Care | Payer: Medicare Other | Source: Ambulatory Visit | Attending: Cardiology | Admitting: Cardiology

## 2017-12-19 ENCOUNTER — Encounter (HOSPITAL_COMMUNITY)
Admission: RE | Admit: 2017-12-19 | Discharge: 2017-12-19 | Disposition: A | Discharge status: Home / Self Care | Payer: Medicare Other | Source: Ambulatory Visit | Attending: Cardiology | Admitting: Cardiology

## 2017-12-23 ENCOUNTER — Encounter (HOSPITAL_COMMUNITY)
Admission: RE | Admit: 2017-12-23 | Discharge: 2017-12-23 | Disposition: A | Discharge status: Home / Self Care | Payer: Medicare Other | Source: Ambulatory Visit | Attending: Cardiology | Admitting: Cardiology

## 2017-12-25 ENCOUNTER — Encounter (HOSPITAL_COMMUNITY)
Admission: RE | Admit: 2017-12-25 | Discharge: 2017-12-25 | Disposition: A | Discharge status: Home / Self Care | Payer: Medicare Other | Source: Ambulatory Visit | Attending: Cardiology | Admitting: Cardiology

## 2017-12-26 ENCOUNTER — Encounter (HOSPITAL_COMMUNITY): Payer: Self-pay

## 2017-12-30 ENCOUNTER — Encounter (HOSPITAL_COMMUNITY)
Admission: RE | Admit: 2017-12-30 | Discharge: 2017-12-30 | Disposition: A | Discharge status: Home / Self Care | Payer: Medicare Other | Source: Ambulatory Visit | Attending: Cardiology | Admitting: Cardiology

## 2018-01-01 ENCOUNTER — Encounter (HOSPITAL_COMMUNITY)
Admission: RE | Admit: 2018-01-01 | Discharge: 2018-01-01 | Disposition: A | Discharge status: Home / Self Care | Payer: Medicare Other | Source: Ambulatory Visit | Attending: Cardiology | Admitting: Cardiology

## 2018-01-01 DIAGNOSIS — E785 Hyperlipidemia, unspecified: Secondary | ICD-10-CM | POA: Insufficient documentation

## 2018-01-01 DIAGNOSIS — E119 Type 2 diabetes mellitus without complications: Secondary | ICD-10-CM | POA: Insufficient documentation

## 2018-01-01 DIAGNOSIS — I208 Other forms of angina pectoris: Secondary | ICD-10-CM | POA: Insufficient documentation

## 2018-01-02 ENCOUNTER — Encounter (HOSPITAL_COMMUNITY)
Admission: RE | Admit: 2018-01-02 | Discharge: 2018-01-02 | Disposition: A | Discharge status: Home / Self Care | Payer: Self-pay | Source: Ambulatory Visit | Attending: Cardiology | Admitting: Cardiology

## 2018-01-06 ENCOUNTER — Encounter (HOSPITAL_COMMUNITY)
Admission: RE | Admit: 2018-01-06 | Discharge: 2018-01-06 | Disposition: A | Discharge status: Home / Self Care | Payer: Self-pay | Source: Ambulatory Visit | Attending: Cardiology | Admitting: Cardiology

## 2018-01-08 ENCOUNTER — Encounter (HOSPITAL_COMMUNITY): Payer: Self-pay

## 2018-01-09 ENCOUNTER — Encounter (HOSPITAL_COMMUNITY): Payer: Self-pay

## 2018-01-10 NOTE — Progress Notes (Signed)
[name] Date of Birth: [birth year] Medical Record #[id number]  History of Present Illness: Jacob Curry is seen for follow up CAD. He has a known history of coronary disease with cardiac catheterization in November 2007 showing total occlusion of a large left circumflex vessel with good left to left collaterals. There was 40% disease in the left main and origin of LAD. 90% mid diagonal, 80-90% mid RCA and 90% RV marginal branch.  Ejection fraction was 50-55%. Prior to this he had a nuclear stress test- he was able to exercise 8:30 with severe ischemia of the inferolateral wall. He has been managed medically. Repeat cardiac cath in May 2011 and May 2016 showed no significant change. He has a history of hypertension, hyperlipidemia, Pafib,  diabetes. He has a history of moderate obstructive sleep apnea. This is managed with an oral appliance. He was unable to tolerate CPAP therapy.  He is anticoagulated with Eliquis.  In December 2018 he developed Afib. Was evaluated in Afib clinic and underwent successful DCCV on 09/16/17.   On follow up today he is doing well. No symptomatic Afib since January. Denies any chest pain, dyspnea, palpitations, edema.  Notes metformin dose increased 3 months ago with A1c up to 7%. Admits he likes sweets and lately has been eating strawberry shortcake.   Current Outpatient Medications on File Prior to Visit  Medication Sig Dispense Refill  . amLODipine (NORVASC) 2.5 MG tablet TAKE 1 TABLET EVERY DAY (NEED MD APPOINTMENT) 90 tablet 2  . apixaban (ELIQUIS) 5 MG TABS tablet Take 1 tablet (5 mg total) by mouth 2 (two) times daily. 180 tablet 1  . carvedilol (COREG) 12.5 MG tablet Take 1&1/2 tablets twice a day 360 tablet 3  . EPINEPHrine 0.3 mg/0.3 mL IJ SOAJ injection Inject 0.3 mLs (0.3 mg total) into the muscle once. 1 Device 3  . fish oil-omega-3 fatty acids 1000 MG capsule Take 2 g by mouth daily.      Marland Kitchen losartan (COZAAR) 100 MG tablet TAKE 1 TABLET EVERY DAY 90 tablet  2  . metFORMIN (GLUCOPHAGE) 500 MG tablet Take 1 tablet (500 mg total) by mouth 2 (two) times daily. Patient needs appointment 60 tablet 0  . Multiple Vitamin (MULTIVITAMIN) tablet Take 1 tablet by mouth daily.    . nitroGLYCERIN (NITROSTAT) 0.4 MG SL tablet Place 1 tablet (0.4 mg total) under the tongue every 5 (five) minutes as needed. 25 tablet 11  . Saw Palmetto, Serenoa repens, (SAW PALMETTO PO) Take 1 tablet by mouth as needed (prostate).     . simvastatin (ZOCOR) 40 MG tablet TAKE 1 TABLET AT BEDTIME 90 tablet 2   No current facility-administered medications on file prior to visit.     Allergies  Allergen Reactions  . Acetaminophen Other (See Comments)    Drug induced hepatitis  . Oxycodone-Acetaminophen Other (See Comments)    Drug induced hepatitis  . Flomax [Tamsulosin Hcl] Other (See Comments)    incontinence    Past Medical History:  Diagnosis Date  . CAD (coronary artery disease)    Dr Peter Martinique  . Carotid stenosis   . Complication of anesthesia    " DIFFICULTY WAKING "  . DM (diabetes mellitus) ([place])   . ED (erectile dysfunction)   . HLD (hyperlipidemia)   . HTN (hypertension)   . Myocardial infarction ([place]) 2007  . Obesity   . OSA (obstructive sleep apnea)    oral appliance, Dr Ron Parker  . Paroxysmal atrial fibrillation ([facility])  Past Surgical History:  Procedure Laterality Date  . [place]  . APPENDECTOMY  1958  . CARDIAC CATHETERIZATION  2007 & 2011  . CARDIAC CATHETERIZATION N/A 01/13/2015   Procedure: Left Heart Cath and Coronary Angiography;  Surgeon: Peter M Martinique, MD;  Location: [place] [facility];  Service: Cardiovascular;  Laterality: N/A;  . CARDIOVERSION N/A 09/16/2017   Procedure: CARDIOVERSION;  Surgeon: Dorothy Spark, MD;  Location: [facility];  Service: Cardiovascular;  Laterality: N/A;  . COLONOSCOPY  2002   negative; Dr Olevia Perches  . [place]  . TONSILLECTOMY      Social History   Tobacco Use   Smoking Status Former Smoker  . Packs/day: 1.00  . Last attempt to quit: 09/02/1990  . Years since quitting: 27.3  Smokeless Tobacco Never Used  Tobacco Comment   onset age [age] up to 1 ppd    Social History   Substance and Sexual Activity  Alcohol Use No    Family History  Problem Relation Age of Onset  . Diabetes Mother   . Heart attack Father         4 vessel CBAG @ 23  . Colon cancer Paternal Uncle   . Diabetes Brother   . Stroke Neg Hx     Review of Systems: As noted in history of present illness.  All other systems were reviewed and are negative.  Physical Exam: BP 130/72   Pulse 62   Ht 5\' 8"  (1.727 m)   Wt 194 lb (88 kg)   SpO2 97%   BMI 29.50 kg/m  GENERAL:  Well appearing overweight WM in NAD HEENT:  PERRL, EOMI, sclera are clear. Oropharynx is clear. NECK:  No jugular venous distention, carotid upstroke brisk and symmetric, no bruits, no thyromegaly or adenopathy LUNGS:  Clear to auscultation bilaterally CHEST:  Unremarkable HEART:  RRR,  PMI not displaced or sustained,S1 and S2 within normal limits, no S3, no S4: no clicks, no rubs, no murmurs ABD:  Soft, nontender. BS +, no masses or bruits. No hepatomegaly, no splenomegaly EXT:  2 + pulses throughout, no edema, no cyanosis no clubbing SKIN:  Warm and dry.  No rashes NEURO:  Alert and oriented x 3. Cranial nerves II through XII intact. PSYCH:  Cognitively intact      LABORATORY DATA: Lab Results  Component Value Date   WBC 9.1 10/06/2017   HGB 13.8 10/06/2017   HCT 40.9 10/06/2017   PLT 214.0 10/06/2017   GLUCOSE 145 (H) 10/06/2017   CHOL 80 10/06/2017   TRIG 154.0 (H) 10/06/2017   HDL 39.80 10/06/2017   LDLDIRECT 41.0 01/31/2017   LDLCALC 10 10/06/2017   ALT 28 10/06/2017   AST 25 10/06/2017   NA 143 10/06/2017   K 4.0 10/06/2017   CL 105 10/06/2017   CREATININE 0.97 10/06/2017   BUN 16 10/06/2017   CO2 29 10/06/2017   TSH 1.21 10/06/2017   PSA 1.70 01/04/2009   INR 1.75 (H)  01/12/2015   HGBA1C 7.0 (H) 10/06/2017   MICROALBUR 0.4 03/09/2012    Assessment / Plan: 1. 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 Moderate to severe 3 vessel disease. Patient is asymptomatic on medical therapy. Cardiac cath  in May 2016 unchanged from 2007 and 2011.  Will continue medical therapy. Follow up in 6 months.  2. Hypertension, well controlled.  3. Hyperlipidemia well controlled on medication.  LDL 10 on Zocor.  4. Atrial fibrillation,  paroxysmal. S/p DCCV on 09/16/17.   His rate was controlled on beta blocker therapy.  He does have a high Chadvasc score of 4. Continue anticoagulation with Eliquis 5 mg twice a day.   5. DM type 2. On metformin at higher dose. Encourage better eating habits and weight loss.    I will follow up in 6 months.

## 2018-01-13 ENCOUNTER — Encounter (HOSPITAL_COMMUNITY)
Admission: RE | Admit: 2018-01-13 | Discharge: 2018-01-13 | Disposition: A | Discharge status: Home / Self Care | Payer: Self-pay | Source: Ambulatory Visit | Attending: Cardiology | Admitting: Cardiology

## 2018-01-13 ENCOUNTER — Other Ambulatory Visit: Payer: Self-pay | Admitting: *Deleted

## 2018-01-13 ENCOUNTER — Telehealth: Payer: Self-pay | Admitting: Family Medicine

## 2018-01-13 MED ORDER — METFORMIN HCL 500 MG PO TABS: 500.0000 mg | ORAL_TABLET | Freq: Two times a day (BID) | ORAL | 0 refills | Status: DC

## 2018-01-13 NOTE — Telephone Encounter (Signed)
Copied from [place] [phone]. Topic: Quick Communication - Rx Refill/Question >> Jan 13, 2018  8:40 AM Synthia Innocent wrote: Medication: metFORMIN (GLUCOPHAGE) 500 MG tablet Has the patient contacted their pharmacy? Yes.   (Agent: If no, request that the patient contact the pharmacy for the refill.) Preferred Pharmacy (with phone number or street name): [facility] [address] Agent: Please be advised that RX refills may take up to 3 business days. We ask that you follow-up with your pharmacy.

## 2018-01-13 NOTE — Telephone Encounter (Signed)
Patient is due follow up for elevated A1C and medication increase. 30 day supply sent with note on Rx needs appointment.

## 2018-01-14 ENCOUNTER — Ambulatory Visit (INDEPENDENT_AMBULATORY_CARE_PROVIDER_SITE_OTHER): Payer: Medicare Other | Admitting: Cardiology

## 2018-01-14 ENCOUNTER — Encounter: Payer: Self-pay | Admitting: Cardiology

## 2018-01-14 VITALS — BP 130/72 | HR 62 | Ht 68.0 in | Wt 194.0 lb

## 2018-01-14 DIAGNOSIS — I2510 Atherosclerotic heart disease of native coronary artery without angina pectoris: Secondary | ICD-10-CM

## 2018-01-14 DIAGNOSIS — E1159 Type 2 diabetes mellitus with other circulatory complications: Secondary | ICD-10-CM

## 2018-01-14 DIAGNOSIS — E7800 Pure hypercholesterolemia, unspecified: Secondary | ICD-10-CM

## 2018-01-14 DIAGNOSIS — I480 Paroxysmal atrial fibrillation: Secondary | ICD-10-CM | POA: Diagnosis not present

## 2018-01-14 NOTE — Patient Instructions (Signed)
Continue your current therapy  Work on losing weight

## 2018-01-15 ENCOUNTER — Encounter (HOSPITAL_COMMUNITY)
Admission: RE | Admit: 2018-01-15 | Discharge: 2018-01-15 | Disposition: A | Discharge status: Home / Self Care | Payer: Medicare Other | Source: Ambulatory Visit | Attending: Cardiology | Admitting: Cardiology

## 2018-01-16 ENCOUNTER — Encounter (HOSPITAL_COMMUNITY)
Admission: RE | Admit: 2018-01-16 | Discharge: 2018-01-16 | Disposition: A | Discharge status: Home / Self Care | Payer: Self-pay | Source: Ambulatory Visit | Attending: Cardiology | Admitting: Cardiology

## 2018-01-20 ENCOUNTER — Encounter (HOSPITAL_COMMUNITY): Payer: Self-pay

## 2018-01-22 ENCOUNTER — Encounter (HOSPITAL_COMMUNITY): Payer: Self-pay

## 2018-01-23 ENCOUNTER — Encounter (HOSPITAL_COMMUNITY): Payer: Self-pay

## 2018-01-27 ENCOUNTER — Encounter (HOSPITAL_COMMUNITY)
Admission: RE | Admit: 2018-01-27 | Discharge: 2018-01-27 | Disposition: A | Discharge status: Home / Self Care | Payer: Medicare Other | Source: Ambulatory Visit | Attending: Cardiology | Admitting: Cardiology

## 2018-01-29 ENCOUNTER — Encounter (HOSPITAL_COMMUNITY)
Admission: RE | Admit: 2018-01-29 | Discharge: 2018-01-29 | Disposition: A | Discharge status: Home / Self Care | Payer: Medicare Other | Source: Ambulatory Visit | Attending: Cardiology | Admitting: Cardiology

## 2018-01-30 ENCOUNTER — Encounter (HOSPITAL_COMMUNITY)
Admission: RE | Admit: 2018-01-30 | Discharge: 2018-01-30 | Disposition: A | Discharge status: Home / Self Care | Payer: Medicare Other | Source: Ambulatory Visit | Attending: Cardiology | Admitting: Cardiology

## 2018-02-03 ENCOUNTER — Encounter (HOSPITAL_COMMUNITY)
Admission: RE | Admit: 2018-02-03 | Discharge: 2018-02-03 | Disposition: A | Discharge status: Home / Self Care | Payer: Medicare Other | Source: Ambulatory Visit | Attending: Cardiology | Admitting: Cardiology

## 2018-02-03 DIAGNOSIS — E785 Hyperlipidemia, unspecified: Secondary | ICD-10-CM | POA: Insufficient documentation

## 2018-02-03 DIAGNOSIS — I208 Other forms of angina pectoris: Secondary | ICD-10-CM | POA: Insufficient documentation

## 2018-02-03 DIAGNOSIS — E119 Type 2 diabetes mellitus without complications: Secondary | ICD-10-CM | POA: Insufficient documentation

## 2018-02-04 ENCOUNTER — Ambulatory Visit: Payer: Medicare Other | Admitting: Family Medicine

## 2018-02-04 ENCOUNTER — Ambulatory Visit: Payer: Medicare Other

## 2018-02-05 ENCOUNTER — Ambulatory Visit: Payer: Medicare Other | Admitting: Family Medicine

## 2018-02-05 ENCOUNTER — Encounter (HOSPITAL_COMMUNITY)
Admission: RE | Admit: 2018-02-05 | Discharge: 2018-02-05 | Disposition: A | Discharge status: Home / Self Care | Payer: Medicare Other | Source: Ambulatory Visit | Attending: Cardiology | Admitting: Cardiology

## 2018-02-06 ENCOUNTER — Encounter (HOSPITAL_COMMUNITY)
Admission: RE | Admit: 2018-02-06 | Discharge: 2018-02-06 | Disposition: A | Discharge status: Home / Self Care | Payer: Medicare Other | Source: Ambulatory Visit | Attending: Cardiology | Admitting: Cardiology

## 2018-02-06 DIAGNOSIS — N138 Other obstructive and reflux uropathy: Secondary | ICD-10-CM | POA: Diagnosis not present

## 2018-02-06 DIAGNOSIS — N529 Male erectile dysfunction, unspecified: Secondary | ICD-10-CM | POA: Diagnosis not present

## 2018-02-06 DIAGNOSIS — N401 Enlarged prostate with lower urinary tract symptoms: Secondary | ICD-10-CM | POA: Diagnosis not present

## 2018-02-09 ENCOUNTER — Ambulatory Visit: Payer: Medicare Other | Admitting: Family Medicine

## 2018-02-10 ENCOUNTER — Other Ambulatory Visit: Payer: Self-pay | Admitting: Family Medicine

## 2018-02-10 ENCOUNTER — Encounter (HOSPITAL_COMMUNITY)
Admission: RE | Admit: 2018-02-10 | Discharge: 2018-02-10 | Disposition: A | Discharge status: Home / Self Care | Payer: Medicare Other | Source: Ambulatory Visit | Attending: Cardiology | Admitting: Cardiology

## 2018-02-12 ENCOUNTER — Encounter (HOSPITAL_COMMUNITY)
Admission: RE | Admit: 2018-02-12 | Discharge: 2018-02-12 | Disposition: A | Discharge status: Home / Self Care | Payer: Medicare Other | Source: Ambulatory Visit | Attending: Cardiology | Admitting: Cardiology

## 2018-02-13 ENCOUNTER — Encounter (HOSPITAL_COMMUNITY)
Admission: RE | Admit: 2018-02-13 | Discharge: 2018-02-13 | Disposition: A | Discharge status: Home / Self Care | Payer: Medicare Other | Source: Ambulatory Visit | Attending: Cardiology | Admitting: Cardiology

## 2018-02-16 ENCOUNTER — Ambulatory Visit (INDEPENDENT_AMBULATORY_CARE_PROVIDER_SITE_OTHER): Payer: Medicare Other

## 2018-02-16 ENCOUNTER — Other Ambulatory Visit: Payer: Self-pay

## 2018-02-16 ENCOUNTER — Encounter: Payer: Self-pay | Admitting: Family Medicine

## 2018-02-16 ENCOUNTER — Ambulatory Visit (INDEPENDENT_AMBULATORY_CARE_PROVIDER_SITE_OTHER): Payer: Medicare Other | Admitting: Family Medicine

## 2018-02-16 VITALS — BP 132/85 | HR 64 | Temp 97.8°F | Resp 18 | Ht 68.0 in | Wt 199.2 lb

## 2018-02-16 VITALS — BP 150/74 | HR 64 | Temp 97.8°F | Resp 18 | Ht 68.0 in | Wt 199.2 lb

## 2018-02-16 DIAGNOSIS — E782 Mixed hyperlipidemia: Secondary | ICD-10-CM

## 2018-02-16 DIAGNOSIS — Z0000 Encounter for general adult medical examination without abnormal findings: Secondary | ICD-10-CM | POA: Diagnosis not present

## 2018-02-16 DIAGNOSIS — I2510 Atherosclerotic heart disease of native coronary artery without angina pectoris: Secondary | ICD-10-CM

## 2018-02-16 DIAGNOSIS — E1159 Type 2 diabetes mellitus with other circulatory complications: Secondary | ICD-10-CM

## 2018-02-16 DIAGNOSIS — I10 Essential (primary) hypertension: Secondary | ICD-10-CM

## 2018-02-16 NOTE — Assessment & Plan Note (Signed)
Chronic problem.  Tolerating Metformin w/o difficulty.  UTD on foot exam, eye exam, and on ARB for renal protection.  Stressed need for healthy diet and regular exercise.  Check labs.  Adjust meds prn  

## 2018-02-16 NOTE — Patient Instructions (Signed)
Follow up in 3-4 months to recheck diabetes We'll notify you of your lab results and make any changes if needed Continue to work on healthy diet and regular exercise- you look good! Call with any questions or concerns Have a great summer!!

## 2018-02-16 NOTE — Patient Instructions (Addendum)
Bring a copy of your living will and/or healthcare power of attorney to your next office visit.  Continue doing brain stimulating activities (puzzles, reading, adult coloring books, staying active) to keep memory sharp.    Health Maintenance, Male A healthy lifestyle and preventive care is important for your health and wellness. Ask your health care provider about what schedule of regular examinations is right for you. What should I know about weight and diet? Eat a Healthy Diet  Eat plenty of vegetables, fruits, whole grains, low-fat dairy products, and lean protein.  Do not eat a lot of foods high in solid fats, added sugars, or salt.  Maintain a Healthy Weight Regular exercise can help you achieve or maintain a healthy weight. You should:  Do at least 150 minutes of exercise each week. The exercise should increase your heart rate and make you sweat (moderate-intensity exercise).  Do strength-training exercises at least twice a week.  Watch Your Levels of Cholesterol and Blood Lipids  Have your blood tested for lipids and cholesterol every 5 years starting at 77 years of age. If you are at high risk for heart disease, you should start having your blood tested when you are 77 years old. You may need to have your cholesterol levels checked more often if: ? Your lipid or cholesterol levels are high. ? You are older than 77 years of age. ? You are at high risk for heart disease.  What should I know about cancer screening? Many types of cancers can be detected early and may often be prevented. Lung Cancer  You should be screened every year for lung cancer if: ? You are a current smoker who has smoked for at least 30 years. ? You are a former smoker who has quit within the past 15 years.  Talk to your health care provider about your screening options, when you should start screening, and how often you should be screened.  Colorectal Cancer  Routine colorectal cancer screening  usually begins at 77 years of age and should be repeated every 5-10 years until you are 77 years old. You may need to be screened more often if early forms of precancerous polyps or small growths are found. Your health care provider may recommend screening at an earlier age if you have risk factors for colon cancer.  Your health care provider may recommend using home test kits to check for hidden blood in the stool.  A small camera at the end of a tube can be used to examine your colon (sigmoidoscopy or colonoscopy). This checks for the earliest forms of colorectal cancer.  Prostate and Testicular Cancer  Depending on your age and overall health, your health care provider may do certain tests to screen for prostate and testicular cancer.  Talk to your health care provider about any symptoms or concerns you have about testicular or prostate cancer.  Skin Cancer  Check your skin from head to toe regularly.  Tell your health care provider about any new moles or changes in moles, especially if: ? There is a change in a mole's size, shape, or color. ? You have a mole that is larger than a pencil eraser.  Always use sunscreen. Apply sunscreen liberally and repeat throughout the day.  Protect yourself by wearing long sleeves, pants, a wide-brimmed hat, and sunglasses when outside.  What should I know about heart disease, diabetes, and high blood pressure?  If you are 18-39 years of age, have your blood pressure checked every   3-5 years. If you are 40 years of age or older, have your blood pressure checked every year. You should have your blood pressure measured twice-once when you are at a hospital or clinic, and once when you are not at a hospital or clinic. Record the average of the two measurements. To check your blood pressure when you are not at a hospital or clinic, you can use: ? An automated blood pressure machine at a pharmacy. ? A home blood pressure monitor.  Talk to your health care  provider about your target blood pressure.  If you are between 45-79 years old, ask your health care provider if you should take aspirin to prevent heart disease.  Have regular diabetes screenings by checking your fasting blood sugar level. ? If you are at a normal weight and have a low risk for diabetes, have this test once every three years after the age of 45. ? If you are overweight and have a high risk for diabetes, consider being tested at a younger age or more often.  A one-time screening for abdominal aortic aneurysm (AAA) by ultrasound is recommended for men aged 65-75 years who are current or former smokers. What should I know about preventing infection? Hepatitis B If you have a higher risk for hepatitis B, you should be screened for this virus. Talk with your health care provider to find out if you are at risk for hepatitis B infection. Hepatitis C Blood testing is recommended for:  Everyone born from 1945 through 1965.  Anyone with known risk factors for hepatitis C.  Sexually Transmitted Diseases (STDs)  You should be screened each year for STDs including gonorrhea and chlamydia if: ? You are sexually active and are younger than 77 years of age. ? You are older than 77 years of age and your health care provider tells you that you are at risk for this type of infection. ? Your sexual activity has changed since you were last screened and you are at an increased risk for chlamydia or gonorrhea. Ask your health care provider if you are at risk.  Talk with your health care provider about whether you are at high risk of being infected with HIV. Your health care provider may recommend a prescription medicine to help prevent HIV infection.  What else can I do?  Schedule regular health, dental, and eye exams.  Stay current with your vaccines (immunizations).  Do not use any tobacco products, such as cigarettes, chewing tobacco, and e-cigarettes. If you need help quitting, ask  your health care provider.  Limit alcohol intake to no more than 2 drinks per day. One drink equals 12 ounces of beer, 5 ounces of wine, or 1 ounces of hard liquor.  Do not use street drugs.  Do not share needles.  Ask your health care provider for help if you need support or information about quitting drugs.  Tell your health care provider if you often feel depressed.  Tell your health care provider if you have ever been abused or do not feel safe at home. This information is not intended to replace advice given to you by your health care provider. Make sure you discuss any questions you have with your health care provider. Document Released: 02/15/2008 Document Revised: 04/17/2016 Document Reviewed: 05/23/2015 Elsevier Interactive Patient Education  2018 [employer] Inc[employer]  

## 2018-02-16 NOTE — Progress Notes (Signed)
   Subjective:    Patient ID: BALDO HUFNAGLE, male    DOB: 09-03-1940, 77 y.o.   MRN: [id number]  HPI HTN- chronic problem, on Amlodipine 2.5mg  daily, Coreg 12.5mg  1.5 tabs BID, Losartan 100mg  daily.  Pt does Cardiac Rehab and BP is taken at least 6x/week and it is always WNL.  No CP, SOB, HAs, visual changes, edema.  DM- chronic problem, on Metformin BID, UTD on eye exam, foot exam done w/ Kim today.  On ARB for renal protection.  Pt has gained 5 lbs since last visit.  Denies symptomatic lows.  No numbness/tingling of hands/feet.  Hyperlipidemia- chronic problem, on Simvastatin 40mg  daily.  No abd pain, N/V, myalgias.   Review of Systems For ROS see HPI     Objective:   Physical Exam  Constitutional: He is oriented to person, place, and time. He appears well-developed and well-nourished. No distress.  obese  HENT:  Head: Normocephalic and atraumatic.  Eyes: Pupils are equal, round, and reactive to light. Conjunctivae and EOM are normal.  Neck: Normal range of motion. Neck supple. No thyromegaly present.  Cardiovascular: Normal rate, regular rhythm, normal heart sounds and intact distal pulses.  No murmur heard. Pulmonary/Chest: Effort normal and breath sounds normal. No respiratory distress.  Abdominal: Soft. Bowel sounds are normal. He exhibits no distension.  Musculoskeletal: He exhibits no edema.  Lymphadenopathy:    He has no cervical adenopathy.  Neurological: He is alert and oriented to person, place, and time. No cranial nerve deficit.  Skin: Skin is warm and dry.  Psychiatric: He has a normal mood and affect. His behavior is normal.  Vitals reviewed.         Assessment & Plan:

## 2018-02-16 NOTE — Assessment & Plan Note (Signed)
Chronic problem.  BP was initially elevated but improved on recheck.  Asymptomatic.  Check labs.  No anticipated med changes.  Will follow.

## 2018-02-16 NOTE — Assessment & Plan Note (Signed)
Chronic problem.  Tolerating statin w/o difficulty.  Check labs.  Adjust meds prn  

## 2018-02-16 NOTE — Progress Notes (Addendum)
Subjective:   Jacob Curry is a 77 y.o. male who presents for Medicare Annual/Subsequent preventive examination.  Review of Systems:  No ROS.  Medicare Wellness Visit. Additional risk factors are reflected in the social history.  Cardiac Risk Factors include: advanced age (>10men, >53 women);diabetes mellitus;dyslipidemia;male gender;hypertension;obesity (BMI >30kg/m2);family history of premature cardiovascular disease   Sleep patterns: Sleeps 6-7 hours. Uses mouth piece for sleep apnea.  Home Safety/Smoke Alarms: Feels safe in home. Smoke alarms in place.  Living environment; residence and Firearm Safety: Lives with wife in 1 story home, mtn home is 2 story (bedroom on first floor).  Seat Belt Safety/Bike Helmet: Wears seat belt.    Male:   CCS-Colonoscopy 09/14/2014, polyps. Recall 5 years.  PSA-  Lab Results  Component Value Date   PSA 1.70 01/04/2009   PSA 1.44 03/21/2008   PSA 1.29 06/11/2007       Objective:    Vitals: BP (!) 150/74 (BP Location: Left Arm, Patient Position: Sitting, Cuff Size: Normal)   Pulse 64   Temp 97.8 F (36.6 C) (Temporal)   Resp 18   Ht 5\' 8"  (1.727 m)   Wt 199 lb 4 oz (90.4 kg)   SpO2 98%   BMI 30.30 kg/m   Body mass index is 30.3 kg/m.  Advanced Directives 02/16/2018 09/16/2017 01/19/2015 01/11/2015  Does Patient Have a Medical Advance Directive? No No No No  Would patient like information on creating a medical advance directive? Yes (MAU/Ambulatory/Procedural Areas - Information given) No - Patient declined - -    Tobacco Social History   Tobacco Use  Smoking Status Former Smoker  . Packs/day: 1.00  . Last attempt to quit: 09/02/1990  . Years since quitting: 27.4  Smokeless Tobacco Never Used  Tobacco Comment   onset age 2-50 up to 1 ppd     Counseling given: Not Answered Comment: onset age 64-50 up to 3 ppd    Past Medical History:  Diagnosis Date  . CAD (coronary artery disease)    Dr Peter Martinique  . Carotid  stenosis   . Complication of anesthesia    " DIFFICULTY WAKING "  . DM (diabetes mellitus) ([place])   . ED (erectile dysfunction)   . HLD (hyperlipidemia)   . HTN (hypertension)   . Myocardial infarction ([place]) 2007  . Obesity   . OSA (obstructive sleep apnea)    oral appliance, Dr Ron Parker  . Paroxysmal atrial fibrillation [facility])    Past Surgical History:  Procedure Laterality Date  . [place]  . APPENDECTOMY  1958  . CARDIAC CATHETERIZATION  2007 & 2011  . CARDIAC CATHETERIZATION N/A 01/13/2015   Procedure: Left Heart Cath and Coronary Angiography;  Surgeon: Peter M Martinique, MD;  Location: [facility];  Service: Cardiovascular;  Laterality: N/A;  . CARDIOVERSION N/A 09/16/2017   Procedure: CARDIOVERSION;  Surgeon: Dorothy Spark, MD;  Location: [facility];  Service: Cardiovascular;  Laterality: N/A;  . COLONOSCOPY  2002   negative; Dr Olevia Perches  . [place]  . TONSILLECTOMY     Family History  Problem Relation Age of Onset  . Diabetes Mother   . Heart attack Father         4 vessel CBAG @ 76  . Colon cancer Paternal Uncle   . Diabetes Brother   . Stroke Neg Hx    Social History   Socioeconomic History  . Marital status: Married    Spouse name: Not  on file  . Number of children: Not on file  . Years of education: Not on file  . Highest education level: Not on file  Occupational History  . Occupation: Art gallery manager / Haematologist  Social Needs  . Financial resource strain: Not on file  . Food insecurity:    Worry: Not on file    Inability: Not on file  . Transportation needs:    Medical: Not on file    Non-medical: Not on file  Tobacco Use  . Smoking status: Former Smoker    Packs/day: 1.00    Last attempt to quit: 09/02/1990    Years since quitting: 27.4  . Smokeless tobacco: Never Used  . Tobacco comment: onset age 57-50 up to 1 ppd  Substance and Sexual Activity  . Alcohol use: No  . Drug use: No  . Sexual activity: Not on file    Lifestyle  . Physical activity:    Days per week: Not on file    Minutes per session: Not on file  . Stress: Not on file  Relationships  . Social connections:    Talks on phone: Not on file    Gets together: Not on file    Attends religious service: Not on file    Active member of club or organization: Not on file    Attends meetings of clubs or organizations: Not on file    Relationship status: Not on file  Other Topics Concern  . Not on file  Social History Narrative  . Not on file    Outpatient Encounter Medications as of 02/16/2018  Medication Sig  . amLODipine (NORVASC) 2.5 MG tablet TAKE 1 TABLET EVERY DAY (NEED MD APPOINTMENT)  . apixaban (ELIQUIS) 5 MG TABS tablet Take 1 tablet (5 mg total) by mouth 2 (two) times daily.  . carvedilol (COREG) 12.5 MG tablet Take 1&1/2 tablets twice a day  . fish oil-omega-3 fatty acids 1000 MG capsule Take 2 g by mouth daily.    Marland Kitchen losartan (COZAAR) 100 MG tablet TAKE 1 TABLET EVERY DAY  . metFORMIN (GLUCOPHAGE) 500 MG tablet Take 1 tablet (500 mg total) by mouth 2 (two) times daily with a meal.  . Multiple Vitamin (MULTIVITAMIN) tablet Take 1 tablet by mouth daily.  . Saw Palmetto, Serenoa repens, (SAW PALMETTO PO) Take 1 tablet by mouth as needed (prostate).   . simvastatin (ZOCOR) 40 MG tablet TAKE 1 TABLET AT BEDTIME  . EPINEPHrine 0.3 mg/0.3 mL IJ SOAJ injection Inject 0.3 mLs (0.3 mg total) into the muscle once. (Patient not taking: Reported on 02/16/2018)  . nitroGLYCERIN (NITROSTAT) 0.4 MG SL tablet Place 1 tablet (0.4 mg total) under the tongue every 5 (five) minutes as needed. (Patient not taking: Reported on 02/16/2018)   No facility-administered encounter medications on file as of 02/16/2018.     Activities of Daily Living In your present state of health, do you have any difficulty performing the following activities: 02/16/2018 10/06/2017  Hearing? N N  Vision? N N  Difficulty concentrating or making decisions? N N  Walking or  climbing stairs? N N  Dressing or bathing? N N  Doing errands, shopping? N N  Preparing Food and eating ? N -  Using the Toilet? N -  In the past six months, have you accidently leaked urine? N -  Do you have problems with loss of bowel control? N -  Managing your Medications? N -  Managing your Finances? N -  Housekeeping or managing your Housekeeping?  N -  Some recent data might be hidden    Patient Care Team: Midge Minium, MD as PCP - General (Family Medicine) Martinique, Peter M, MD as Consulting Physician (Cardiology) Myrlene Broker, MD as Attending Physician (Urology) Druscilla Brownie, MD as Consulting Physician (Dermatology) Kristeen Miss, MD as Consulting Physician (Neurosurgery) Deneise Lever, MD as Consulting Physician (Pulmonary Disease) Oneal Grout, DDS as Referring Physician (Orthodontics)   Assessment:   This is a routine wellness examination for Jacob Curry.  Exercise Activities and Dietary recommendations Current Exercise Habits: Structured exercise class, Type of exercise: Other - see comments(Cardiac rehab), Time (Minutes): 60, Frequency (Times/Week): 3, Weekly Exercise (Minutes/Week): 180, Exercise limited by: None identified   Diet (meal preparation, eat out, water intake, caffeinated beverages, dairy products, fruits and vegetables): Drinks diet pepsi, water (small), coffee and lemonade  Breakfast: eggs; fruit salad (fresh); cereal; oatmeal; coffee (decaf) Lunch: sandwich; fast food Dinner: protein and vegetables.    Goals    . Weight (lb) < 190 lb (86.2 kg)     Lose weight by controlling sugar/carb intake.        Fall Risk Fall Risk  02/16/2018 10/06/2017 01/31/2017 02/02/2016  Falls in the past year? No No No No    Depression Screen PHQ 2/9 Scores 02/16/2018 10/06/2017 06/16/2017 01/31/2017  PHQ - 2 Score 0 0 0 0  PHQ- 9 Score - 0 0 0    Cognitive Function MMSE - Mini Mental State Exam 02/16/2018  Orientation to time 5  Orientation to Place 5    Registration 3  Attention/ Calculation 5  Recall 1  Language- name 2 objects 2  Language- repeat 1  Language- follow 3 step command 3  Language- read & follow direction 1  Write a sentence 1  Copy design 1  Total score 28        Immunization History  Administered Date(s) Administered  . Influenza, High Dose Seasonal PF 08/18/2013  . Influenza,inj,Quad PF,6+ Mos 07/04/2014, 06/22/2015, 06/10/2016, 06/16/2017  . Pneumococcal Conjugate-13 07/24/2015  . Pneumococcal Polysaccharide-23 07/23/2007  . Zoster 03/22/2008    Screening Tests Health Maintenance  Topic Date Due  . TETANUS/TDAP  02/17/2019 (Originally 01/03/1960)  . INFLUENZA VACCINE  04/02/2018  . HEMOGLOBIN A1C  04/05/2018  . OPHTHALMOLOGY EXAM  11/21/2018  . FOOT EXAM  02/17/2019  . COLONOSCOPY  09/15/2019  . PNA vac Low Risk Adult  Completed     Diabetic Foot Exam - Simple   Simple Foot Form Diabetic Foot exam was performed with the following findings:  Yes 02/16/2018  2:35 PM  Visual Inspection No deformities, no ulcerations, no other skin breakdown bilaterally:  Yes Sensation Testing Intact to touch and monofilament testing bilaterally:  Yes Pulse Check Posterior Tibialis and Dorsalis pulse intact bilaterally:  Yes Comments          Plan:    Bring a copy of your living will and/or healthcare power of attorney to your next office visit.  Continue doing brain stimulating activities (puzzles, reading, adult coloring books, staying active) to keep memory sharp.   I have personally reviewed and noted the following in the patient's chart:   . Medical and social history . Use of alcohol, tobacco or illicit drugs  . Current medications and supplements . Functional ability and status . Nutritional status . Physical activity . Advanced directives . List of other physicians . Hospitalizations, surgeries, and ER visits in previous 12 months . Vitals . Screenings to include cognitive, depression, and  falls . Referrals and appointments  In addition, I have reviewed and discussed with patient certain preventive protocols, quality metrics, and best practice recommendations. A written personalized care plan for preventive services as well as general preventive health recommendations were provided to patient.     Gerilyn Nestle, RN  02/16/2018  Reviewed documentation provided by RN and agree w/ above.  Annye Asa, MD

## 2018-02-17 ENCOUNTER — Encounter (HOSPITAL_COMMUNITY)
Admission: RE | Admit: 2018-02-17 | Discharge: 2018-02-17 | Disposition: A | Discharge status: Home / Self Care | Payer: Medicare Other | Source: Ambulatory Visit | Attending: Cardiology | Admitting: Cardiology

## 2018-02-19 ENCOUNTER — Encounter: Payer: Self-pay | Admitting: General Practice

## 2018-02-19 ENCOUNTER — Encounter (HOSPITAL_COMMUNITY)
Admission: RE | Admit: 2018-02-19 | Discharge: 2018-02-19 | Disposition: A | Discharge status: Home / Self Care | Payer: Medicare Other | Source: Ambulatory Visit | Attending: Cardiology | Admitting: Cardiology

## 2018-02-20 ENCOUNTER — Encounter (HOSPITAL_COMMUNITY)
Admission: RE | Admit: 2018-02-20 | Discharge: 2018-02-20 | Disposition: A | Discharge status: Home / Self Care | Payer: Medicare Other | Source: Ambulatory Visit | Attending: Cardiology | Admitting: Cardiology

## 2018-02-24 ENCOUNTER — Encounter (HOSPITAL_COMMUNITY)
Admission: RE | Admit: 2018-02-24 | Discharge: 2018-02-24 | Disposition: A | Discharge status: Home / Self Care | Payer: Medicare Other | Source: Ambulatory Visit | Attending: Cardiology | Admitting: Cardiology

## 2018-02-26 ENCOUNTER — Encounter (HOSPITAL_COMMUNITY)
Admission: RE | Admit: 2018-02-26 | Discharge: 2018-02-26 | Disposition: A | Discharge status: Home / Self Care | Payer: Medicare Other | Source: Ambulatory Visit | Attending: Cardiology | Admitting: Cardiology

## 2018-02-27 ENCOUNTER — Encounter (HOSPITAL_COMMUNITY)
Admission: RE | Admit: 2018-02-27 | Discharge: 2018-02-27 | Disposition: A | Discharge status: Home / Self Care | Payer: Medicare Other | Source: Ambulatory Visit | Attending: Cardiology | Admitting: Cardiology

## 2018-03-03 ENCOUNTER — Encounter (HOSPITAL_COMMUNITY)
Admission: RE | Admit: 2018-03-03 | Discharge: 2018-03-03 | Disposition: A | Discharge status: Home / Self Care | Payer: Medicare Other | Source: Ambulatory Visit | Attending: Cardiology | Admitting: Cardiology

## 2018-03-03 DIAGNOSIS — I208 Other forms of angina pectoris: Secondary | ICD-10-CM | POA: Insufficient documentation

## 2018-03-03 DIAGNOSIS — E785 Hyperlipidemia, unspecified: Secondary | ICD-10-CM | POA: Insufficient documentation

## 2018-03-03 DIAGNOSIS — E119 Type 2 diabetes mellitus without complications: Secondary | ICD-10-CM | POA: Insufficient documentation

## 2018-03-06 ENCOUNTER — Encounter (HOSPITAL_COMMUNITY): Payer: Self-pay

## 2018-03-10 ENCOUNTER — Encounter (HOSPITAL_COMMUNITY)
Admission: RE | Admit: 2018-03-10 | Discharge: 2018-03-10 | Disposition: A | Discharge status: Home / Self Care | Payer: Medicare Other | Source: Ambulatory Visit | Attending: Cardiology | Admitting: Cardiology

## 2018-03-12 ENCOUNTER — Encounter (HOSPITAL_COMMUNITY)
Admission: RE | Admit: 2018-03-12 | Discharge: 2018-03-12 | Disposition: A | Discharge status: Home / Self Care | Payer: Medicare Other | Source: Ambulatory Visit | Attending: Cardiology | Admitting: Cardiology

## 2018-03-13 ENCOUNTER — Encounter (HOSPITAL_COMMUNITY)
Admission: RE | Admit: 2018-03-13 | Discharge: 2018-03-13 | Disposition: A | Discharge status: Home / Self Care | Payer: Medicare Other | Source: Ambulatory Visit | Attending: Cardiology | Admitting: Cardiology

## 2018-03-17 ENCOUNTER — Encounter (HOSPITAL_COMMUNITY)
Admission: RE | Admit: 2018-03-17 | Discharge: 2018-03-17 | Disposition: A | Discharge status: Home / Self Care | Payer: Medicare Other | Source: Ambulatory Visit | Attending: Cardiology | Admitting: Cardiology

## 2018-03-17 DIAGNOSIS — B079 Viral wart, unspecified: Secondary | ICD-10-CM | POA: Diagnosis not present

## 2018-03-17 DIAGNOSIS — L819 Disorder of pigmentation, unspecified: Secondary | ICD-10-CM | POA: Diagnosis not present

## 2018-03-17 DIAGNOSIS — D485 Neoplasm of uncertain behavior of skin: Secondary | ICD-10-CM | POA: Diagnosis not present

## 2018-03-17 DIAGNOSIS — L821 Other seborrheic keratosis: Secondary | ICD-10-CM | POA: Diagnosis not present

## 2018-03-17 DIAGNOSIS — D229 Melanocytic nevi, unspecified: Secondary | ICD-10-CM | POA: Diagnosis not present

## 2018-03-17 DIAGNOSIS — L570 Actinic keratosis: Secondary | ICD-10-CM | POA: Diagnosis not present

## 2018-03-17 DIAGNOSIS — L814 Other melanin hyperpigmentation: Secondary | ICD-10-CM | POA: Diagnosis not present

## 2018-03-19 ENCOUNTER — Telehealth: Payer: Self-pay | Admitting: Cardiology

## 2018-03-19 ENCOUNTER — Encounter (HOSPITAL_COMMUNITY)
Admission: RE | Admit: 2018-03-19 | Discharge: 2018-03-19 | Disposition: A | Discharge status: Home / Self Care | Payer: Medicare Other | Source: Ambulatory Visit | Attending: Cardiology | Admitting: Cardiology

## 2018-03-19 NOTE — Telephone Encounter (Signed)
Faxed signed order for cardiac rehab maintenance program to [phone]

## 2018-03-20 ENCOUNTER — Encounter (HOSPITAL_COMMUNITY)
Admission: RE | Admit: 2018-03-20 | Discharge: 2018-03-20 | Disposition: A | Discharge status: Home / Self Care | Payer: Medicare Other | Source: Ambulatory Visit | Attending: Cardiology | Admitting: Cardiology

## 2018-03-24 ENCOUNTER — Encounter (HOSPITAL_COMMUNITY)
Admission: RE | Admit: 2018-03-24 | Discharge: 2018-03-24 | Disposition: A | Discharge status: Home / Self Care | Payer: Medicare Other | Source: Ambulatory Visit | Attending: Cardiology | Admitting: Cardiology

## 2018-03-26 ENCOUNTER — Encounter (HOSPITAL_COMMUNITY)
Admission: RE | Admit: 2018-03-26 | Discharge: 2018-03-26 | Disposition: A | Discharge status: Home / Self Care | Payer: Medicare Other | Source: Ambulatory Visit | Attending: Cardiology | Admitting: Cardiology

## 2018-03-27 ENCOUNTER — Encounter (HOSPITAL_COMMUNITY)
Admission: RE | Admit: 2018-03-27 | Discharge: 2018-03-27 | Disposition: A | Discharge status: Home / Self Care | Payer: Medicare Other | Source: Ambulatory Visit | Attending: Cardiology | Admitting: Cardiology

## 2018-03-31 ENCOUNTER — Encounter (HOSPITAL_COMMUNITY)
Admission: RE | Admit: 2018-03-31 | Discharge: 2018-03-31 | Disposition: A | Discharge status: Home / Self Care | Payer: Self-pay | Source: Ambulatory Visit | Attending: Cardiology | Admitting: Cardiology

## 2018-04-02 ENCOUNTER — Encounter (HOSPITAL_COMMUNITY)
Admission: RE | Admit: 2018-04-02 | Discharge: 2018-04-02 | Disposition: A | Discharge status: Home / Self Care | Payer: Self-pay | Source: Ambulatory Visit | Attending: Cardiology | Admitting: Cardiology

## 2018-04-02 DIAGNOSIS — I2510 Atherosclerotic heart disease of native coronary artery without angina pectoris: Secondary | ICD-10-CM | POA: Insufficient documentation

## 2018-04-03 ENCOUNTER — Encounter (HOSPITAL_COMMUNITY)
Admission: RE | Admit: 2018-04-03 | Discharge: 2018-04-03 | Disposition: A | Discharge status: Home / Self Care | Payer: Self-pay | Source: Ambulatory Visit | Attending: Cardiology | Admitting: Cardiology

## 2018-04-07 ENCOUNTER — Encounter (HOSPITAL_COMMUNITY)
Admission: RE | Admit: 2018-04-07 | Discharge: 2018-04-07 | Disposition: A | Discharge status: Home / Self Care | Payer: Self-pay | Source: Ambulatory Visit | Attending: Cardiology | Admitting: Cardiology

## 2018-04-09 ENCOUNTER — Encounter (HOSPITAL_COMMUNITY)
Admission: RE | Admit: 2018-04-09 | Discharge: 2018-04-09 | Disposition: A | Discharge status: Home / Self Care | Payer: Self-pay | Source: Ambulatory Visit | Attending: Cardiology | Admitting: Cardiology

## 2018-04-10 ENCOUNTER — Encounter (HOSPITAL_COMMUNITY)
Admission: RE | Admit: 2018-04-10 | Discharge: 2018-04-10 | Disposition: A | Discharge status: Home / Self Care | Payer: Self-pay | Source: Ambulatory Visit | Attending: Cardiology | Admitting: Cardiology

## 2018-04-14 ENCOUNTER — Encounter (HOSPITAL_COMMUNITY)
Admission: RE | Admit: 2018-04-14 | Discharge: 2018-04-14 | Disposition: A | Discharge status: Home / Self Care | Payer: Self-pay | Source: Ambulatory Visit | Attending: Cardiology | Admitting: Cardiology

## 2018-04-16 ENCOUNTER — Encounter (HOSPITAL_COMMUNITY)
Admission: RE | Admit: 2018-04-16 | Discharge: 2018-04-16 | Disposition: A | Discharge status: Home / Self Care | Payer: Self-pay | Source: Ambulatory Visit | Attending: Cardiology | Admitting: Cardiology

## 2018-04-17 ENCOUNTER — Encounter (HOSPITAL_COMMUNITY)
Admission: RE | Admit: 2018-04-17 | Discharge: 2018-04-17 | Disposition: A | Discharge status: Home / Self Care | Payer: Self-pay | Source: Ambulatory Visit | Attending: Cardiology | Admitting: Cardiology

## 2018-04-21 ENCOUNTER — Encounter (HOSPITAL_COMMUNITY)
Admission: RE | Admit: 2018-04-21 | Discharge: 2018-04-21 | Disposition: A | Discharge status: Home / Self Care | Payer: Self-pay | Source: Ambulatory Visit | Attending: Cardiology | Admitting: Cardiology

## 2018-04-22 ENCOUNTER — Telehealth: Payer: Self-pay | Admitting: Cardiology

## 2018-04-22 NOTE — Telephone Encounter (Signed)
New Message    Patient calling the office for samples of medication:   1.  What medication and dosage are you requesting samples for? apixaban (ELIQUIS) 5 MG TABS tablet   2.  Are you currently out of this medication? Patient has three days remaining.

## 2018-04-22 NOTE — Telephone Encounter (Signed)
Medication samples have been provided to the patient.  Drug name: Eliquis 5 mg  Qty: 42    LOT: [id number]  Exp.Date: 11/21  Samples left at front desk for patient pick-up. Patient notified.

## 2018-04-23 ENCOUNTER — Encounter (HOSPITAL_COMMUNITY)
Admission: RE | Admit: 2018-04-23 | Discharge: 2018-04-23 | Disposition: A | Discharge status: Home / Self Care | Payer: Self-pay | Source: Ambulatory Visit | Attending: Cardiology | Admitting: Cardiology

## 2018-04-24 ENCOUNTER — Encounter (HOSPITAL_COMMUNITY)
Admission: RE | Admit: 2018-04-24 | Discharge: 2018-04-24 | Disposition: A | Discharge status: Home / Self Care | Payer: Self-pay | Source: Ambulatory Visit | Attending: Cardiology | Admitting: Cardiology

## 2018-04-28 ENCOUNTER — Encounter (HOSPITAL_COMMUNITY)
Admission: RE | Admit: 2018-04-28 | Discharge: 2018-04-28 | Disposition: A | Discharge status: Home / Self Care | Payer: Self-pay | Source: Ambulatory Visit | Attending: Cardiology | Admitting: Cardiology

## 2018-04-30 ENCOUNTER — Encounter (HOSPITAL_COMMUNITY)
Admission: RE | Admit: 2018-04-30 | Discharge: 2018-04-30 | Disposition: A | Discharge status: Home / Self Care | Payer: Self-pay | Source: Ambulatory Visit | Attending: Cardiology | Admitting: Cardiology

## 2018-05-01 ENCOUNTER — Encounter (HOSPITAL_COMMUNITY)
Admission: RE | Admit: 2018-05-01 | Discharge: 2018-05-01 | Disposition: A | Discharge status: Home / Self Care | Payer: Self-pay | Source: Ambulatory Visit | Attending: Cardiology | Admitting: Cardiology

## 2018-05-05 ENCOUNTER — Encounter (HOSPITAL_COMMUNITY)
Admission: RE | Admit: 2018-05-05 | Discharge: 2018-05-05 | Disposition: A | Discharge status: Home / Self Care | Payer: Medicare Other | Source: Ambulatory Visit | Attending: Cardiology | Admitting: Cardiology

## 2018-05-05 DIAGNOSIS — I2510 Atherosclerotic heart disease of native coronary artery without angina pectoris: Secondary | ICD-10-CM | POA: Insufficient documentation

## 2018-05-07 ENCOUNTER — Encounter (HOSPITAL_COMMUNITY)
Admission: RE | Admit: 2018-05-07 | Discharge: 2018-05-07 | Disposition: A | Discharge status: Home / Self Care | Payer: Medicare Other | Source: Ambulatory Visit | Attending: Cardiology | Admitting: Cardiology

## 2018-05-08 ENCOUNTER — Encounter (HOSPITAL_COMMUNITY)
Admission: RE | Admit: 2018-05-08 | Discharge: 2018-05-08 | Disposition: A | Discharge status: Home / Self Care | Payer: Medicare Other | Source: Ambulatory Visit | Attending: Cardiology | Admitting: Cardiology

## 2018-05-12 ENCOUNTER — Encounter (HOSPITAL_COMMUNITY)
Admission: RE | Admit: 2018-05-12 | Discharge: 2018-05-12 | Disposition: A | Discharge status: Home / Self Care | Payer: Medicare Other | Source: Ambulatory Visit | Attending: Cardiology | Admitting: Cardiology

## 2018-05-14 ENCOUNTER — Encounter (HOSPITAL_COMMUNITY)
Admission: RE | Admit: 2018-05-14 | Discharge: 2018-05-14 | Disposition: A | Discharge status: Home / Self Care | Payer: Medicare Other | Source: Ambulatory Visit | Attending: Cardiology | Admitting: Cardiology

## 2018-05-14 ENCOUNTER — Other Ambulatory Visit: Payer: Self-pay | Admitting: Cardiology

## 2018-05-15 ENCOUNTER — Other Ambulatory Visit: Payer: Self-pay | Admitting: Cardiology

## 2018-05-15 ENCOUNTER — Encounter (HOSPITAL_COMMUNITY)
Admission: RE | Admit: 2018-05-15 | Discharge: 2018-05-15 | Disposition: A | Discharge status: Home / Self Care | Payer: Medicare Other | Source: Ambulatory Visit | Attending: Cardiology | Admitting: Cardiology

## 2018-05-15 DIAGNOSIS — Z23 Encounter for immunization: Secondary | ICD-10-CM | POA: Diagnosis not present

## 2018-05-19 ENCOUNTER — Encounter (HOSPITAL_COMMUNITY)
Admission: RE | Admit: 2018-05-19 | Discharge: 2018-05-19 | Disposition: A | Discharge status: Home / Self Care | Payer: Medicare Other | Source: Ambulatory Visit | Attending: Cardiology | Admitting: Cardiology

## 2018-05-19 MED ORDER — APIXABAN 5 MG PO TABS: 5.0000 mg | ORAL_TABLET | Freq: Two times a day (BID) | ORAL | 1 refills | Status: DC

## 2018-05-19 NOTE — Addendum Note (Signed)
Addended by: Leanord Asal T on: 05/19/2018 07:28 AM   Modules accepted: Orders

## 2018-05-21 ENCOUNTER — Encounter (HOSPITAL_COMMUNITY)
Admission: RE | Admit: 2018-05-21 | Discharge: 2018-05-21 | Disposition: A | Discharge status: Home / Self Care | Payer: Medicare Other | Source: Ambulatory Visit | Attending: Cardiology | Admitting: Cardiology

## 2018-05-22 ENCOUNTER — Encounter (HOSPITAL_COMMUNITY)
Admission: RE | Admit: 2018-05-22 | Discharge: 2018-05-22 | Disposition: A | Discharge status: Home / Self Care | Payer: Self-pay | Source: Ambulatory Visit | Attending: Cardiology | Admitting: Cardiology

## 2018-05-26 ENCOUNTER — Encounter (HOSPITAL_COMMUNITY)
Admission: RE | Admit: 2018-05-26 | Discharge: 2018-05-26 | Disposition: A | Discharge status: Home / Self Care | Payer: Medicare Other | Source: Ambulatory Visit | Attending: Cardiology | Admitting: Cardiology

## 2018-05-28 ENCOUNTER — Encounter (HOSPITAL_COMMUNITY)
Admission: RE | Admit: 2018-05-28 | Discharge: 2018-05-28 | Disposition: A | Discharge status: Home / Self Care | Payer: Medicare Other | Source: Ambulatory Visit | Attending: Cardiology | Admitting: Cardiology

## 2018-05-29 ENCOUNTER — Encounter (HOSPITAL_COMMUNITY)
Admission: RE | Admit: 2018-05-29 | Discharge: 2018-05-29 | Disposition: A | Discharge status: Home / Self Care | Payer: Medicare Other | Source: Ambulatory Visit | Attending: Cardiology | Admitting: Cardiology

## 2018-06-02 ENCOUNTER — Encounter (HOSPITAL_COMMUNITY)
Admission: RE | Admit: 2018-06-02 | Discharge: 2018-06-02 | Disposition: A | Discharge status: Home / Self Care | Payer: Medicare Other | Source: Ambulatory Visit | Attending: Cardiology | Admitting: Cardiology

## 2018-06-02 DIAGNOSIS — I2510 Atherosclerotic heart disease of native coronary artery without angina pectoris: Secondary | ICD-10-CM | POA: Insufficient documentation

## 2018-06-02 LAB — TEST AUTHORIZATION

## 2018-06-02 LAB — BASIC METABOLIC PANEL
BUN: 20 mg/dL (ref 7–25)
CO2: 26 mmol/L (ref 20–32)
Calcium: 9.4 mg/dL (ref 8.6–10.3)
Chloride: 108 mmol/L (ref 98–110)
Creat: 0.84 mg/dL (ref 0.70–1.18)
Glucose, Bld: 108 mg/dL — ABNORMAL HIGH (ref 65–99)
Potassium: 4 mmol/L (ref 3.5–5.3)
Sodium: 141 mmol/L (ref 135–146)

## 2018-06-02 LAB — CBC WITH DIFFERENTIAL/PLATELET
Basophils Absolute: 78 cells/uL (ref 0–200)
Basophils Relative: 0.9 %
Eosinophils Absolute: 200 cells/uL (ref 15–500)
Eosinophils Relative: 2.3 %
HCT: 40.4 % (ref 38.5–50.0)
Hemoglobin: 13.7 g/dL (ref 13.2–17.1)
Lymphs Abs: 2436 cells/uL (ref 850–3900)
MCH: 29.3 pg (ref 27.0–33.0)
MCHC: 33.9 g/dL (ref 32.0–36.0)
MCV: 86.5 fL (ref 80.0–100.0)
MPV: 11.2 fL (ref 7.5–12.5)
Monocytes Relative: 10.4 %
Neutro Abs: 5081 cells/uL (ref 1500–7800)
Neutrophils Relative %: 58.4 %
Platelets: 196 10*3/uL (ref 140–400)
RBC: 4.67 10*6/uL (ref 4.20–5.80)
RDW: 13.4 % (ref 11.0–15.0)
Total Lymphocyte: 28 %
WBC mixed population: 905 cells/uL (ref 200–950)
WBC: 8.7 10*3/uL (ref 3.8–10.8)

## 2018-06-02 LAB — HEPATIC FUNCTION PANEL
AG Ratio: 1.9 (calc) (ref 1.0–2.5)
ALT: 21 U/L (ref 9–46)
AST: 19 U/L (ref 10–35)
Albumin: 4.3 g/dL (ref 3.6–5.1)
Alkaline phosphatase (APISO): 68 U/L (ref 40–115)
Bilirubin, Direct: 0.1 mg/dL (ref 0.0–0.2)
Globulin: 2.3 g/dL (calc) (ref 1.9–3.7)
Indirect Bilirubin: 0.6 mg/dL (calc) (ref 0.2–1.2)
Total Bilirubin: 0.7 mg/dL (ref 0.2–1.2)
Total Protein: 6.6 g/dL (ref 6.1–8.1)

## 2018-06-02 LAB — LIPID PANEL
Cholesterol: 110 mg/dL (ref <200)
HDL: 45 mg/dL (ref >40)
LDL Cholesterol (Calc): 36 mg/dL (calc)
Non-HDL Cholesterol (Calc): 65 mg/dL (calc) (ref <130)
Total CHOL/HDL Ratio: 2.4 (calc) (ref <5.0)
Triglycerides: 230 mg/dL — ABNORMAL HIGH (ref <150)

## 2018-06-02 LAB — HEMOGLOBIN A1C W/OUT EAG: Hgb A1c MFr Bld: 6.2 % of total Hgb — ABNORMAL HIGH (ref <5.7)

## 2018-06-02 LAB — TSH: TSH: 1.54 mIU/L (ref 0.40–4.50)

## 2018-06-04 ENCOUNTER — Encounter (HOSPITAL_COMMUNITY)
Admission: RE | Admit: 2018-06-04 | Discharge: 2018-06-04 | Disposition: A | Discharge status: Home / Self Care | Payer: Medicare Other | Source: Ambulatory Visit | Attending: Cardiology | Admitting: Cardiology

## 2018-06-05 ENCOUNTER — Encounter (HOSPITAL_COMMUNITY)
Admission: RE | Admit: 2018-06-05 | Discharge: 2018-06-05 | Disposition: A | Discharge status: Home / Self Care | Payer: Medicare Other | Source: Ambulatory Visit | Attending: Cardiology | Admitting: Cardiology

## 2018-06-09 ENCOUNTER — Encounter (HOSPITAL_COMMUNITY)
Admission: RE | Admit: 2018-06-09 | Discharge: 2018-06-09 | Disposition: A | Discharge status: Home / Self Care | Payer: Medicare Other | Source: Ambulatory Visit | Attending: Cardiology | Admitting: Cardiology

## 2018-06-10 ENCOUNTER — Telehealth: Payer: Self-pay | Admitting: Cardiology

## 2018-06-10 NOTE — Telephone Encounter (Signed)
Patient calling the office for samples of medication: ° ° °1.  What medication and dosage are you requesting samples for? apixaban (ELIQUIS) 5 MG TABS tablet ° °2.  Are you currently out of this medication? no ° ° °

## 2018-06-10 NOTE — Telephone Encounter (Signed)
Returned call to patient Eliquis 5 mg samples left at [facility] office front desk. 

## 2018-06-11 ENCOUNTER — Other Ambulatory Visit: Payer: Self-pay

## 2018-06-11 ENCOUNTER — Encounter (HOSPITAL_COMMUNITY)
Admission: RE | Admit: 2018-06-11 | Discharge: 2018-06-11 | Disposition: A | Discharge status: Home / Self Care | Payer: Medicare Other | Source: Ambulatory Visit | Attending: Cardiology | Admitting: Cardiology

## 2018-06-11 ENCOUNTER — Ambulatory Visit (INDEPENDENT_AMBULATORY_CARE_PROVIDER_SITE_OTHER): Payer: Medicare Other | Admitting: Family Medicine

## 2018-06-11 ENCOUNTER — Encounter: Payer: Self-pay | Admitting: Family Medicine

## 2018-06-11 VITALS — BP 130/82 | HR 60 | Temp 98.1°F | Resp 16 | Ht 68.0 in | Wt 199.4 lb

## 2018-06-11 DIAGNOSIS — I2510 Atherosclerotic heart disease of native coronary artery without angina pectoris: Secondary | ICD-10-CM | POA: Diagnosis not present

## 2018-06-11 DIAGNOSIS — I10 Essential (primary) hypertension: Secondary | ICD-10-CM

## 2018-06-11 DIAGNOSIS — E782 Mixed hyperlipidemia: Secondary | ICD-10-CM

## 2018-06-11 DIAGNOSIS — E1159 Type 2 diabetes mellitus with other circulatory complications: Secondary | ICD-10-CM

## 2018-06-11 LAB — HEMOGLOBIN A1C: Hgb A1c MFr Bld: 6.4 % (ref 4.6–6.5)

## 2018-06-11 LAB — LIPID PANEL
Cholesterol: 102 mg/dL (ref 0–200)
HDL: 44.4 mg/dL (ref >39.00)
LDL Cholesterol: 26 mg/dL (ref 0–99)
NonHDL: 57.56
Total CHOL/HDL Ratio: 2
Triglycerides: 156 mg/dL — ABNORMAL HIGH (ref 0.0–149.0)
VLDL: 31.2 mg/dL (ref 0.0–40.0)

## 2018-06-11 LAB — CBC WITH DIFFERENTIAL/PLATELET
Basophils Absolute: 0.1 10*3/uL (ref 0.0–0.1)
Basophils Relative: 0.8 % (ref 0.0–3.0)
Eosinophils Absolute: 0.2 10*3/uL (ref 0.0–0.7)
Eosinophils Relative: 1.9 % (ref 0.0–5.0)
HCT: 42 % (ref 39.0–52.0)
Hemoglobin: 14 g/dL (ref 13.0–17.0)
Lymphocytes Relative: 29.8 % (ref 12.0–46.0)
Lymphs Abs: 2.4 10*3/uL (ref 0.7–4.0)
MCHC: 33.2 g/dL (ref 30.0–36.0)
MCV: 89.3 fl (ref 78.0–100.0)
Monocytes Absolute: 0.8 10*3/uL (ref 0.1–1.0)
Monocytes Relative: 9.8 % (ref 3.0–12.0)
Neutro Abs: 4.7 10*3/uL (ref 1.4–7.7)
Neutrophils Relative %: 57.7 % (ref 43.0–77.0)
Platelets: 191 10*3/uL (ref 150.0–400.0)
RBC: 4.7 Mil/uL (ref 4.22–5.81)
RDW: 13.7 % (ref 11.5–15.5)
WBC: 8.1 10*3/uL (ref 4.0–10.5)

## 2018-06-11 LAB — BASIC METABOLIC PANEL
BUN: 22 mg/dL (ref 6–23)
CO2: 29 mEq/L (ref 19–32)
Calcium: 9.3 mg/dL (ref 8.4–10.5)
Chloride: 107 mEq/L (ref 96–112)
Creatinine, Ser: 0.91 mg/dL (ref 0.40–1.50)
GFR: 85.77 mL/min (ref >60.00)
Glucose, Bld: 108 mg/dL — ABNORMAL HIGH (ref 70–99)
Potassium: 4.4 mEq/L (ref 3.5–5.1)
Sodium: 141 mEq/L (ref 135–145)

## 2018-06-11 LAB — HEPATIC FUNCTION PANEL
ALT: 24 U/L (ref 0–53)
AST: 21 U/L (ref 0–37)
Albumin: 4.4 g/dL (ref 3.5–5.2)
Alkaline Phosphatase: 59 U/L (ref 39–117)
Bilirubin, Direct: 0.2 mg/dL (ref 0.0–0.3)
Total Bilirubin: 0.8 mg/dL (ref 0.2–1.2)
Total Protein: 6.9 g/dL (ref 6.0–8.3)

## 2018-06-11 LAB — TSH: TSH: 2.67 u[IU]/mL (ref 0.35–4.50)

## 2018-06-11 NOTE — Patient Instructions (Addendum)
Follow up in 3-4 months to recheck diabetes We'll notify you of your lab results and make any changes if needed Continue to work on healthy diet and regular exercise- you're doing great! Call with any questions or concerns Happy Fall!!! 

## 2018-06-11 NOTE — Assessment & Plan Note (Signed)
Chronic problem.  Tolerating Metformin w/o difficulty.  UTD on foot exam, eye exam, and on ARB for renal protection.  Stressed need for healthy diet and regular exercise.  Check labs.  Adjust meds prn

## 2018-06-11 NOTE — Progress Notes (Signed)
   Subjective:    Patient ID: Jacob Curry, male    DOB: [birth year], 77 y.o.   MRN: [id number]  HPI DM- chronic problem, on Metformin 500mg  BID.  UTD on eye exam, foot exam, on ARB for renal protection.  Denies symptomatic lows.  No numbness/tingling of hands/feet.  Pt is doing cardiac rehab 3x/week.  HTN- chronic problem, on Amlodipine 2.5mg  daily, Coreg 12.5mg  BID, Losartan 100mg  daily.  No CP, SOB, HAs, visual changes, edema.  Hyperlipidemia- chronic problem, on Simvastatin 40mg  daily.  No abd pain, N/V.   Review of Systems For ROS see HPI     Objective:   Physical Exam  Constitutional: He is oriented to person, place, and time. He appears well-developed and well-nourished. No distress.  obese  HENT:  Head: Normocephalic and atraumatic.  Eyes: Pupils are equal, round, and reactive to light. Conjunctivae and EOM are normal.  Neck: Normal range of motion. Neck supple. No thyromegaly present.  Cardiovascular: Normal rate, regular rhythm, normal heart sounds and intact distal pulses.  No murmur heard. Pulmonary/Chest: Effort normal and breath sounds normal. No respiratory distress.  Abdominal: Soft. Bowel sounds are normal. He exhibits no distension.  Musculoskeletal: He exhibits no edema.  Lymphadenopathy:    He has no cervical adenopathy.  Neurological: He is alert and oriented to person, place, and time. No cranial nerve deficit.  Skin: Skin is warm and dry.  Psychiatric: He has a normal mood and affect. His behavior is normal.  Vitals reviewed.         Assessment & Plan:

## 2018-06-11 NOTE — Assessment & Plan Note (Signed)
Chronic problem.  Well controlled.  Asymptomatic.  Check labs.  No anticipated med changes.  Will follow. 

## 2018-06-11 NOTE — Assessment & Plan Note (Signed)
Chronic problem.  On Simvastatin daily.  Check labs.  Adjust meds prn

## 2018-06-12 ENCOUNTER — Encounter (HOSPITAL_COMMUNITY)
Admission: RE | Admit: 2018-06-12 | Discharge: 2018-06-12 | Disposition: A | Discharge status: Home / Self Care | Payer: Medicare Other | Source: Ambulatory Visit | Attending: Cardiology | Admitting: Cardiology

## 2018-06-16 ENCOUNTER — Encounter (HOSPITAL_COMMUNITY): Payer: Self-pay

## 2018-06-18 ENCOUNTER — Encounter (HOSPITAL_COMMUNITY)
Admission: RE | Admit: 2018-06-18 | Discharge: 2018-06-18 | Disposition: A | Discharge status: Home / Self Care | Payer: Medicare Other | Source: Ambulatory Visit | Attending: Cardiology | Admitting: Cardiology

## 2018-06-19 ENCOUNTER — Encounter (HOSPITAL_COMMUNITY): Payer: Self-pay

## 2018-06-23 ENCOUNTER — Encounter (HOSPITAL_COMMUNITY): Payer: Self-pay

## 2018-06-25 ENCOUNTER — Encounter (HOSPITAL_COMMUNITY): Payer: Self-pay

## 2018-06-26 ENCOUNTER — Encounter (HOSPITAL_COMMUNITY): Payer: Self-pay

## 2018-06-30 ENCOUNTER — Encounter (HOSPITAL_COMMUNITY): Payer: Self-pay

## 2018-07-02 ENCOUNTER — Encounter (HOSPITAL_COMMUNITY): Payer: Self-pay

## 2018-07-03 ENCOUNTER — Encounter (HOSPITAL_COMMUNITY): Payer: Self-pay

## 2018-07-03 DIAGNOSIS — I2510 Atherosclerotic heart disease of native coronary artery without angina pectoris: Secondary | ICD-10-CM | POA: Insufficient documentation

## 2018-07-07 ENCOUNTER — Encounter (HOSPITAL_COMMUNITY)
Admission: RE | Admit: 2018-07-07 | Discharge: 2018-07-07 | Disposition: A | Discharge status: Home / Self Care | Payer: Medicare Other | Source: Ambulatory Visit | Attending: Cardiology | Admitting: Cardiology

## 2018-07-09 ENCOUNTER — Encounter (HOSPITAL_COMMUNITY)
Admission: RE | Admit: 2018-07-09 | Discharge: 2018-07-09 | Disposition: A | Discharge status: Home / Self Care | Payer: Medicare Other | Source: Ambulatory Visit | Attending: Cardiology | Admitting: Cardiology

## 2018-07-10 ENCOUNTER — Encounter (HOSPITAL_COMMUNITY)
Admission: RE | Admit: 2018-07-10 | Discharge: 2018-07-10 | Disposition: A | Discharge status: Home / Self Care | Payer: Medicare Other | Source: Ambulatory Visit | Attending: Cardiology | Admitting: Cardiology

## 2018-07-13 ENCOUNTER — Other Ambulatory Visit: Payer: Self-pay | Admitting: Cardiology

## 2018-07-14 ENCOUNTER — Encounter (HOSPITAL_COMMUNITY)
Admission: RE | Admit: 2018-07-14 | Discharge: 2018-07-14 | Disposition: A | Discharge status: Home / Self Care | Payer: Medicare Other | Source: Ambulatory Visit | Attending: Cardiology | Admitting: Cardiology

## 2018-07-16 ENCOUNTER — Encounter (HOSPITAL_COMMUNITY)
Admission: RE | Admit: 2018-07-16 | Discharge: 2018-07-16 | Disposition: A | Discharge status: Home / Self Care | Payer: Self-pay | Source: Ambulatory Visit | Attending: Cardiology | Admitting: Cardiology

## 2018-07-16 ENCOUNTER — Other Ambulatory Visit: Payer: Self-pay | Admitting: Family Medicine

## 2018-07-17 ENCOUNTER — Encounter (HOSPITAL_COMMUNITY)
Admission: RE | Admit: 2018-07-17 | Discharge: 2018-07-17 | Disposition: A | Discharge status: Home / Self Care | Payer: Self-pay | Source: Ambulatory Visit | Attending: Cardiology | Admitting: Cardiology

## 2018-07-21 ENCOUNTER — Encounter (HOSPITAL_COMMUNITY)
Admission: RE | Admit: 2018-07-21 | Discharge: 2018-07-21 | Disposition: A | Discharge status: Home / Self Care | Payer: Self-pay | Source: Ambulatory Visit | Attending: Cardiology | Admitting: Cardiology

## 2018-07-23 ENCOUNTER — Encounter (HOSPITAL_COMMUNITY)
Admission: RE | Admit: 2018-07-23 | Discharge: 2018-07-23 | Disposition: A | Discharge status: Home / Self Care | Payer: Medicare Other | Source: Ambulatory Visit | Attending: Cardiology | Admitting: Cardiology

## 2018-07-24 ENCOUNTER — Encounter (HOSPITAL_COMMUNITY)
Admission: RE | Admit: 2018-07-24 | Discharge: 2018-07-24 | Disposition: A | Discharge status: Home / Self Care | Payer: Medicare Other | Source: Ambulatory Visit | Attending: Cardiology | Admitting: Cardiology

## 2018-07-24 NOTE — Progress Notes (Signed)
[name] Date of Birth: [birth year] Medical Record #[id number]  History of Present Illness: Jacob Curry is seen for follow up CAD. He has a known history of coronary disease with cardiac catheterization in November 2007 showing total occlusion of a large left circumflex vessel with good left to left collaterals. There was 40% disease in the left main and origin of LAD. 90% mid diagonal, 80-90% mid RCA and 90% RV marginal branch.  Ejection fraction was 50-55%. Prior to this he had a nuclear stress test- he was able to exercise 8:30 with severe ischemia of the inferolateral wall. He has been managed medically. Repeat cardiac cath in May 2011 and May 2016 showed no significant change. He has a history of hypertension, hyperlipidemia, Pafib,  diabetes. He has a history of moderate obstructive sleep apnea. This is managed with an oral appliance. He was unable to tolerate CPAP therapy.  He is anticoagulated with Eliquis.  In December 2018 he developed Afib. Was evaluated in Afib clinic and underwent successful DCCV on 09/16/17. He thinks this was related to getting a steroid shot for bronchitis.   On follow up today he is doing well. Denies any recurrent AFib.  Denies any chest pain, dyspnea, palpitations, edema.  Notes metformin dose increased 3 months ago with A1c up to 7%. Admits he could do better with his diet. He continues to participate in Cardiac Rehab. He is having to spend more time now caring for his wife.   Current Outpatient Medications on File Prior to Visit  Medication Sig Dispense Refill  . amLODipine (NORVASC) 2.5 MG tablet TAKE 1 TABLET EVERY DAY (NEED MD APPOINTMENT) 90 tablet 0  . apixaban (ELIQUIS) 5 MG TABS tablet Take 1 tablet (5 mg total) by mouth 2 (two) times daily. Please schedule appointment for refills 180 tablet 1  . carvedilol (COREG) 12.5 MG tablet Take 1&1/2 tablets twice a day 360 tablet 3  . EPINEPHrine 0.3 mg/0.3 mL IJ SOAJ injection Inject 0.3 mLs (0.3 mg total) into  the muscle once. 1 Device 3  . fish oil-omega-3 fatty acids 1000 MG capsule Take 2 g by mouth daily.      Marland Kitchen losartan (COZAAR) 100 MG tablet TAKE 1 TABLET EVERY DAY 90 tablet 2  . metFORMIN (GLUCOPHAGE) 500 MG tablet TAKE 1 TABLET BY MOUTH 2 (TWO) TIMES DAILY WITH A MEAL. 180 tablet 2  . Multiple Vitamin (MULTIVITAMIN) tablet Take 1 tablet by mouth daily.    . nitroGLYCERIN (NITROSTAT) 0.4 MG SL tablet Place 1 tablet (0.4 mg total) under the tongue every 5 (five) minutes as needed. 25 tablet 11  . Saw Palmetto, Serenoa repens, (SAW PALMETTO PO) Take 1 tablet by mouth as needed (prostate).     . simvastatin (ZOCOR) 40 MG tablet TAKE 1 TABLET AT BEDTIME 90 tablet 0   No current facility-administered medications on file prior to visit.     Allergies  Allergen Reactions  . Acetaminophen Other (See Comments)    Drug induced hepatitis  . Oxycodone-Acetaminophen Other (See Comments)    Drug induced hepatitis  . Flomax [Tamsulosin Hcl] Other (See Comments)    incontinence    Past Medical History:  Diagnosis Date  . CAD (coronary artery disease)    Dr Peter Martinique  . Carotid stenosis   . Complication of anesthesia    " DIFFICULTY WAKING "  . DM (diabetes mellitus) ([place])   . ED (erectile dysfunction)   . HLD (hyperlipidemia)   . HTN (hypertension)   .  Myocardial infarction ([place]) 2007  . Obesity   . OSA (obstructive sleep apnea)    oral appliance, Dr Ron Parker  . Paroxysmal atrial fibrillation [facility])     Past Surgical History:  Procedure Laterality Date  . [place]  . APPENDECTOMY  1958  . CARDIAC CATHETERIZATION  2007 & 2011  . CARDIAC CATHETERIZATION N/A 01/13/2015   Procedure: Left Heart Cath and Coronary Angiography;  Surgeon: Peter M Martinique, MD;  Location: [facility];  Service: Cardiovascular;  Laterality: N/A;  . CARDIOVERSION N/A 09/16/2017   Procedure: CARDIOVERSION;  Surgeon: Dorothy Spark, MD;  Location: [place];  Service: Cardiovascular;   Laterality: N/A;  . COLONOSCOPY  2002   negative; Dr Olevia Perches  . [place]  . TONSILLECTOMY      Social History   Tobacco Use  Smoking Status Former Smoker  . Packs/day: 1.00  . Last attempt to quit: 09/02/1990  . Years since quitting: 27.9  Smokeless Tobacco Never Used  Tobacco Comment   onset age [age] up to 1 ppd    Social History   Substance and Sexual Activity  Alcohol Use No    Family History  Problem Relation Age of Onset  . Diabetes Mother   . Heart attack Father         4 vessel CBAG @ 10  . Colon cancer Paternal Uncle   . Diabetes Brother   . Stroke Neg Hx     Review of Systems: As noted in history of present illness.  All other systems were reviewed and are negative.  Physical Exam: BP 124/60   Pulse 66   Ht 5\' 8"  (1.727 m)   Wt 199 lb (90.3 kg)   SpO2 94%   BMI 30.26 kg/m  GENERAL:  Well appearing obese WM in NAD HEENT:  PERRL, EOMI, sclera are clear. Oropharynx is clear. NECK:  No jugular venous distention, carotid upstroke brisk and symmetric, no bruits, no thyromegaly or adenopathy LUNGS:  Clear to auscultation bilaterally CHEST:  Unremarkable HEART:  RRR,  PMI not displaced or sustained,S1 and S2 within normal limits, no S3, no S4: no clicks, no rubs, no murmurs ABD:  Soft, nontender. BS +, no masses or bruits. No hepatomegaly, no splenomegaly EXT:  2 + pulses throughout, no edema, no cyanosis no clubbing SKIN:  Warm and dry.  No rashes NEURO:  Alert and oriented x 3. Cranial nerves II through XII intact. PSYCH:  Cognitively intact   LABORATORY DATA: Lab Results  Component Value Date   WBC 8.1 06/11/2018   HGB 14.0 06/11/2018   HCT 42.0 06/11/2018   PLT 191.0 06/11/2018   GLUCOSE 108 (H) 06/11/2018   CHOL 102 06/11/2018   TRIG 156.0 (H) 06/11/2018   HDL 44.40 06/11/2018   LDLDIRECT 41.0 01/31/2017   LDLCALC 26 06/11/2018   ALT 24 06/11/2018   AST 21 06/11/2018   NA 141 06/11/2018   K 4.4 06/11/2018   CL 107 06/11/2018    CREATININE 0.91 06/11/2018   BUN 22 06/11/2018   CO2 29 06/11/2018   TSH 2.67 06/11/2018   PSA 1.70 01/04/2009   INR 1.75 (H) 01/12/2015   HGBA1C 6.4 06/11/2018   MICROALBUR 0.4 03/09/2012    Assessment / Plan: 1. 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 Moderate to severe 3 vessel disease. Patient remains asymptomatic on medical therapy.   Will continue medical therapy. Follow up in 6 months.  2. Hypertension, well  controlled.    3. Hyperlipidemia well controlled on medication.  LDL 26 on Zocor.  4. Atrial fibrillation, paroxysmal. S/p DCCV on 09/16/17.   He has a Chadvasc score of 4. Continue anticoagulation with Eliquis 5 mg twice a day.   5. DM type 2. On metformin at higher dose. Encourage better eating habits and weight loss. If additional therapy is needed would consider an SGLT 2 inhibitor.     I will follow up in 6 months.

## 2018-07-28 ENCOUNTER — Ambulatory Visit (INDEPENDENT_AMBULATORY_CARE_PROVIDER_SITE_OTHER): Payer: Medicare Other | Admitting: Cardiology

## 2018-07-28 ENCOUNTER — Encounter: Payer: Self-pay | Admitting: Cardiology

## 2018-07-28 ENCOUNTER — Encounter (HOSPITAL_COMMUNITY)
Admission: RE | Admit: 2018-07-28 | Discharge: 2018-07-28 | Disposition: A | Discharge status: Home / Self Care | Payer: Self-pay | Source: Ambulatory Visit | Attending: Cardiology | Admitting: Cardiology

## 2018-07-28 VITALS — BP 124/60 | HR 66 | Ht 68.0 in | Wt 199.0 lb

## 2018-07-28 DIAGNOSIS — I2510 Atherosclerotic heart disease of native coronary artery without angina pectoris: Secondary | ICD-10-CM

## 2018-07-28 DIAGNOSIS — E7800 Pure hypercholesterolemia, unspecified: Secondary | ICD-10-CM | POA: Diagnosis not present

## 2018-08-04 ENCOUNTER — Encounter (HOSPITAL_COMMUNITY)
Admission: RE | Admit: 2018-08-04 | Discharge: 2018-08-04 | Disposition: A | Discharge status: Home / Self Care | Payer: Medicare Other | Source: Ambulatory Visit | Attending: Cardiology | Admitting: Cardiology

## 2018-08-04 DIAGNOSIS — I2510 Atherosclerotic heart disease of native coronary artery without angina pectoris: Secondary | ICD-10-CM | POA: Insufficient documentation

## 2018-08-06 ENCOUNTER — Encounter (HOSPITAL_COMMUNITY)
Admission: RE | Admit: 2018-08-06 | Discharge: 2018-08-06 | Disposition: A | Discharge status: Home / Self Care | Payer: Medicare Other | Source: Ambulatory Visit | Attending: Cardiology | Admitting: Cardiology

## 2018-08-07 ENCOUNTER — Encounter (HOSPITAL_COMMUNITY)
Admission: RE | Admit: 2018-08-07 | Discharge: 2018-08-07 | Disposition: A | Discharge status: Home / Self Care | Payer: Medicare Other | Source: Ambulatory Visit | Attending: Cardiology | Admitting: Cardiology

## 2018-08-11 ENCOUNTER — Encounter (HOSPITAL_COMMUNITY): Payer: Self-pay

## 2018-08-13 ENCOUNTER — Encounter (HOSPITAL_COMMUNITY)
Admission: RE | Admit: 2018-08-13 | Discharge: 2018-08-13 | Disposition: A | Discharge status: Home / Self Care | Payer: Self-pay | Source: Ambulatory Visit | Attending: Cardiology | Admitting: Cardiology

## 2018-08-14 ENCOUNTER — Encounter (HOSPITAL_COMMUNITY): Payer: Self-pay

## 2018-08-14 DIAGNOSIS — E119 Type 2 diabetes mellitus without complications: Secondary | ICD-10-CM | POA: Diagnosis not present

## 2018-08-14 DIAGNOSIS — N401 Enlarged prostate with lower urinary tract symptoms: Secondary | ICD-10-CM | POA: Diagnosis not present

## 2018-08-14 DIAGNOSIS — N138 Other obstructive and reflux uropathy: Secondary | ICD-10-CM | POA: Diagnosis not present

## 2018-08-14 DIAGNOSIS — N528 Other male erectile dysfunction: Secondary | ICD-10-CM | POA: Diagnosis not present

## 2018-08-18 ENCOUNTER — Encounter (HOSPITAL_COMMUNITY)
Admission: RE | Admit: 2018-08-18 | Discharge: 2018-08-18 | Disposition: A | Discharge status: Home / Self Care | Payer: Medicare Other | Source: Ambulatory Visit | Attending: Cardiology | Admitting: Cardiology

## 2018-08-20 ENCOUNTER — Encounter (HOSPITAL_COMMUNITY)
Admission: RE | Admit: 2018-08-20 | Discharge: 2018-08-20 | Disposition: A | Discharge status: Home / Self Care | Payer: Medicare Other | Source: Ambulatory Visit | Attending: Cardiology | Admitting: Cardiology

## 2018-08-21 ENCOUNTER — Encounter (HOSPITAL_COMMUNITY)
Admission: RE | Admit: 2018-08-21 | Discharge: 2018-08-21 | Disposition: A | Discharge status: Home / Self Care | Payer: Medicare Other | Source: Ambulatory Visit | Attending: Cardiology | Admitting: Cardiology

## 2018-08-25 ENCOUNTER — Encounter (HOSPITAL_COMMUNITY)
Admission: RE | Admit: 2018-08-25 | Discharge: 2018-08-25 | Disposition: A | Discharge status: Home / Self Care | Payer: Medicare Other | Source: Ambulatory Visit | Attending: Cardiology | Admitting: Cardiology

## 2018-08-27 ENCOUNTER — Encounter (HOSPITAL_COMMUNITY)
Admission: RE | Admit: 2018-08-27 | Discharge: 2018-08-27 | Disposition: A | Discharge status: Home / Self Care | Payer: Medicare Other | Source: Ambulatory Visit | Attending: Cardiology | Admitting: Cardiology

## 2018-08-28 ENCOUNTER — Encounter (HOSPITAL_COMMUNITY)
Admission: RE | Admit: 2018-08-28 | Discharge: 2018-08-28 | Disposition: A | Discharge status: Home / Self Care | Payer: Medicare Other | Source: Ambulatory Visit | Attending: Cardiology | Admitting: Cardiology

## 2018-09-01 ENCOUNTER — Encounter (HOSPITAL_COMMUNITY)
Admission: RE | Admit: 2018-09-01 | Discharge: 2018-09-01 | Disposition: A | Discharge status: Home / Self Care | Payer: Medicare Other | Source: Ambulatory Visit | Attending: Cardiology | Admitting: Cardiology

## 2018-09-03 ENCOUNTER — Encounter (HOSPITAL_COMMUNITY)
Admission: RE | Admit: 2018-09-03 | Discharge: 2018-09-03 | Disposition: A | Discharge status: Home / Self Care | Payer: Medicare Other | Source: Ambulatory Visit | Attending: Cardiology | Admitting: Cardiology

## 2018-09-03 DIAGNOSIS — I2510 Atherosclerotic heart disease of native coronary artery without angina pectoris: Secondary | ICD-10-CM | POA: Insufficient documentation

## 2018-09-04 ENCOUNTER — Encounter (HOSPITAL_COMMUNITY): Payer: Self-pay

## 2018-09-08 ENCOUNTER — Encounter (HOSPITAL_COMMUNITY)
Admission: RE | Admit: 2018-09-08 | Discharge: 2018-09-08 | Disposition: A | Discharge status: Home / Self Care | Payer: Medicare Other | Source: Ambulatory Visit | Attending: Cardiology | Admitting: Cardiology

## 2018-09-10 ENCOUNTER — Encounter (HOSPITAL_COMMUNITY)
Admission: RE | Admit: 2018-09-10 | Discharge: 2018-09-10 | Disposition: A | Discharge status: Home / Self Care | Payer: Medicare Other | Source: Ambulatory Visit | Attending: Cardiology | Admitting: Cardiology

## 2018-09-11 ENCOUNTER — Encounter (HOSPITAL_COMMUNITY)
Admission: RE | Admit: 2018-09-11 | Discharge: 2018-09-11 | Disposition: A | Discharge status: Home / Self Care | Payer: Medicare Other | Source: Ambulatory Visit | Attending: Cardiology | Admitting: Cardiology

## 2018-09-15 ENCOUNTER — Encounter (HOSPITAL_COMMUNITY)
Admission: RE | Admit: 2018-09-15 | Discharge: 2018-09-15 | Disposition: A | Discharge status: Home / Self Care | Payer: Medicare Other | Source: Ambulatory Visit | Attending: Cardiology | Admitting: Cardiology

## 2018-09-17 ENCOUNTER — Encounter (HOSPITAL_COMMUNITY)
Admission: RE | Admit: 2018-09-17 | Discharge: 2018-09-17 | Disposition: A | Discharge status: Home / Self Care | Payer: Medicare Other | Source: Ambulatory Visit | Attending: Cardiology | Admitting: Cardiology

## 2018-09-17 DIAGNOSIS — L570 Actinic keratosis: Secondary | ICD-10-CM | POA: Diagnosis not present

## 2018-09-17 DIAGNOSIS — L819 Disorder of pigmentation, unspecified: Secondary | ICD-10-CM | POA: Diagnosis not present

## 2018-09-18 ENCOUNTER — Encounter (HOSPITAL_COMMUNITY)
Admission: RE | Admit: 2018-09-18 | Discharge: 2018-09-18 | Disposition: A | Discharge status: Home / Self Care | Payer: Medicare Other | Source: Ambulatory Visit | Attending: Cardiology | Admitting: Cardiology

## 2018-09-21 ENCOUNTER — Other Ambulatory Visit: Payer: Self-pay | Admitting: Cardiology

## 2018-09-22 ENCOUNTER — Encounter (HOSPITAL_COMMUNITY)
Admission: RE | Admit: 2018-09-22 | Discharge: 2018-09-22 | Disposition: A | Discharge status: Home / Self Care | Payer: Medicare Other | Source: Ambulatory Visit | Attending: Cardiology | Admitting: Cardiology

## 2018-09-22 NOTE — Telephone Encounter (Signed)
Rx has been sent to the pharmacy electronically. ° °

## 2018-09-24 ENCOUNTER — Encounter (HOSPITAL_COMMUNITY)
Admission: RE | Admit: 2018-09-24 | Discharge: 2018-09-24 | Disposition: A | Discharge status: Home / Self Care | Payer: Medicare Other | Source: Ambulatory Visit | Attending: Cardiology | Admitting: Cardiology

## 2018-09-25 ENCOUNTER — Encounter (HOSPITAL_COMMUNITY)
Admission: RE | Admit: 2018-09-25 | Discharge: 2018-09-25 | Disposition: A | Discharge status: Home / Self Care | Payer: Medicare Other | Source: Ambulatory Visit | Attending: Cardiology | Admitting: Cardiology

## 2018-09-29 ENCOUNTER — Encounter (HOSPITAL_COMMUNITY)
Admission: RE | Admit: 2018-09-29 | Discharge: 2018-09-29 | Disposition: A | Discharge status: Home / Self Care | Payer: Medicare Other | Source: Ambulatory Visit | Attending: Cardiology | Admitting: Cardiology

## 2018-10-01 ENCOUNTER — Encounter (HOSPITAL_COMMUNITY)
Admission: RE | Admit: 2018-10-01 | Discharge: 2018-10-01 | Disposition: A | Discharge status: Home / Self Care | Payer: Medicare Other | Source: Ambulatory Visit | Attending: Cardiology | Admitting: Cardiology

## 2018-10-02 ENCOUNTER — Encounter (HOSPITAL_COMMUNITY)
Admission: RE | Admit: 2018-10-02 | Discharge: 2018-10-02 | Disposition: A | Discharge status: Home / Self Care | Payer: Medicare Other | Source: Ambulatory Visit | Attending: Cardiology | Admitting: Cardiology

## 2018-10-06 ENCOUNTER — Encounter (HOSPITAL_COMMUNITY): Payer: Self-pay

## 2018-10-06 DIAGNOSIS — I2510 Atherosclerotic heart disease of native coronary artery without angina pectoris: Secondary | ICD-10-CM | POA: Insufficient documentation

## 2018-10-06 DIAGNOSIS — Z539 Procedure and treatment not carried out, unspecified reason: Secondary | ICD-10-CM | POA: Insufficient documentation

## 2018-10-08 ENCOUNTER — Encounter (HOSPITAL_COMMUNITY)
Admission: RE | Admit: 2018-10-08 | Discharge: 2018-10-08 | Disposition: A | Discharge status: Home / Self Care | Payer: Self-pay | Source: Ambulatory Visit | Attending: Cardiology | Admitting: Cardiology

## 2018-10-09 ENCOUNTER — Encounter (HOSPITAL_COMMUNITY): Payer: Self-pay

## 2018-10-13 ENCOUNTER — Encounter (HOSPITAL_COMMUNITY)
Admission: RE | Admit: 2018-10-13 | Discharge: 2018-10-13 | Disposition: A | Discharge status: Home / Self Care | Payer: Self-pay | Source: Ambulatory Visit | Attending: Cardiology | Admitting: Cardiology

## 2018-10-15 ENCOUNTER — Encounter (HOSPITAL_COMMUNITY)
Admission: RE | Admit: 2018-10-15 | Discharge: 2018-10-15 | Disposition: A | Discharge status: Home / Self Care | Payer: Self-pay | Source: Ambulatory Visit | Attending: Cardiology | Admitting: Cardiology

## 2018-10-15 ENCOUNTER — Encounter: Payer: Self-pay | Admitting: Family Medicine

## 2018-10-15 ENCOUNTER — Other Ambulatory Visit: Payer: Self-pay

## 2018-10-15 ENCOUNTER — Ambulatory Visit (INDEPENDENT_AMBULATORY_CARE_PROVIDER_SITE_OTHER): Payer: Medicare Other | Admitting: Family Medicine

## 2018-10-15 VITALS — BP 126/62 | HR 61 | Temp 98.1°F | Resp 16 | Ht 68.0 in | Wt 203.0 lb

## 2018-10-15 DIAGNOSIS — E1159 Type 2 diabetes mellitus with other circulatory complications: Secondary | ICD-10-CM | POA: Diagnosis not present

## 2018-10-15 DIAGNOSIS — M25552 Pain in left hip: Secondary | ICD-10-CM

## 2018-10-15 LAB — HEMOGLOBIN A1C: Hgb A1c MFr Bld: 6.5 % (ref 4.6–6.5)

## 2018-10-15 LAB — BASIC METABOLIC PANEL
BUN: 23 mg/dL (ref 6–23)
CO2: 29 mEq/L (ref 19–32)
Calcium: 9.2 mg/dL (ref 8.4–10.5)
Chloride: 105 mEq/L (ref 96–112)
Creatinine, Ser: 0.98 mg/dL (ref 0.40–1.50)
GFR: 74.01 mL/min (ref >60.00)
Glucose, Bld: 103 mg/dL — ABNORMAL HIGH (ref 70–99)
Potassium: 4.3 mEq/L (ref 3.5–5.1)
Sodium: 142 mEq/L (ref 135–145)

## 2018-10-15 MED ORDER — TIZANIDINE HCL 4 MG PO TABS: 4.0000 mg | ORAL_TABLET | Freq: Three times a day (TID) | ORAL | 0 refills | Status: DC | PRN

## 2018-10-15 MED ORDER — DICLOFENAC SODIUM 1 % TD GEL: 4.0000 g | Freq: Four times a day (QID) | TRANSDERMAL | 1 refills | Status: DC

## 2018-10-15 NOTE — Progress Notes (Signed)
   Subjective:    Patient ID: Jacob Curry, male    DOB: November 17, 1940, 78 y.o.   MRN: [id number]  HPI DM- chronic problem, on Metformin 500mg  BID w/ hx of good control.  UTD on foot exam, eye exam.  On ARB for renal protection.  Pt has gained 5 lbs since last visit.  Not checking CBGs.  Denies symptomatic lows.  No CP, SOB, HAs, visual changes, abd pain, N/V.  No numbness/tingling of hands/feet.  Has upcoming eye exam scheduled.  Hip pain- L hip, started 2-3 months ago.  'real sore'.  Pain is present 'most of the time'.  Eases w/ ibuprofen.  Pt has hx of back problems.  Pain is lateral.  Review of Systems For ROS see HPI     Objective:   Physical Exam Vitals signs reviewed.  Constitutional:      General: He is not in acute distress.    Appearance: He is well-developed. He is obese.  HENT:     Head: Normocephalic and atraumatic.  Eyes:     Conjunctiva/sclera: Conjunctivae normal.     Pupils: Pupils are equal, round, and reactive to light.  Neck:     Musculoskeletal: Normal range of motion and neck supple.     Thyroid: No thyromegaly.  Cardiovascular:     Rate and Rhythm: Normal rate and regular rhythm.     Heart sounds: Normal heart sounds. No murmur.  Pulmonary:     Effort: Pulmonary effort is normal. No respiratory distress.     Breath sounds: Normal breath sounds.  Abdominal:     General: Bowel sounds are normal. There is no distension.     Palpations: Abdomen is soft.  Musculoskeletal:        General: Tenderness (TTP over L sciatic notch) present. No swelling or deformity.  Lymphadenopathy:     Cervical: No cervical adenopathy.  Skin:    General: Skin is warm and dry.  Neurological:     Mental Status: He is alert and oriented to person, place, and time.     Cranial Nerves: No cranial nerve deficit.  Psychiatric:        Behavior: Behavior normal.           Assessment & Plan:  L hip pain- new.  I suspect his pain is due to the fact that he is sitting on a  rather large wallet in his L back pocket.  Full ROM of L hip.  Mild TTP over sciatic notch.  Due to Eliquis cannot use oral NSAIDs.  Due to his previous reaction to steroids (Afib) will not use Prednisone.  Start topical Voltaren and low dose muscle relaxer and pt is to switch his wallet.  Pt expressed understanding and is in agreement w/ plan.

## 2018-10-15 NOTE — Patient Instructions (Signed)
Follow up in 3-4 months to recheck BP, cholesterol, and diabetes We'll notify you of your lab results and make any changes if needed APPLY the Diclofenac gel to the hip as needed for pain and inflammation USE the Tizanidine as needed for spasm- may cause drowsiness Alternate ice/heat for pain relief If no improvement, let me know and we can refer to ortho Continue to work on healthy diet and regular exercise- you're doing great! Call with any questions or concerns Happy Valentine's Day!

## 2018-10-15 NOTE — Assessment & Plan Note (Signed)
Chronic problem.  Tolerating Metformin w/o difficulty.  UTD on foot exam, eye exam.  On ARB for renal protection.  Encouraged healthy diet and regular exercise.  Check labs.  Adjust meds prn

## 2018-10-16 ENCOUNTER — Encounter (HOSPITAL_COMMUNITY)
Admission: RE | Admit: 2018-10-16 | Discharge: 2018-10-16 | Disposition: A | Discharge status: Home / Self Care | Payer: Self-pay | Source: Ambulatory Visit | Attending: Cardiology | Admitting: Cardiology

## 2018-10-20 ENCOUNTER — Encounter (HOSPITAL_COMMUNITY)
Admission: RE | Admit: 2018-10-20 | Discharge: 2018-10-20 | Disposition: A | Discharge status: Home / Self Care | Payer: Self-pay | Source: Ambulatory Visit | Attending: Cardiology | Admitting: Cardiology

## 2018-10-22 ENCOUNTER — Encounter (HOSPITAL_COMMUNITY)
Admission: RE | Admit: 2018-10-22 | Discharge: 2018-10-22 | Disposition: A | Discharge status: Home / Self Care | Payer: Self-pay | Source: Ambulatory Visit | Attending: Cardiology | Admitting: Cardiology

## 2018-10-23 ENCOUNTER — Encounter (HOSPITAL_COMMUNITY): Payer: Self-pay

## 2018-10-27 ENCOUNTER — Encounter (HOSPITAL_COMMUNITY)
Admission: RE | Admit: 2018-10-27 | Discharge: 2018-10-27 | Disposition: A | Discharge status: Home / Self Care | Payer: Self-pay | Source: Ambulatory Visit | Attending: Cardiology | Admitting: Cardiology

## 2018-10-29 ENCOUNTER — Encounter (HOSPITAL_COMMUNITY)
Admission: RE | Admit: 2018-10-29 | Discharge: 2018-10-29 | Disposition: A | Discharge status: Home / Self Care | Payer: Self-pay | Source: Ambulatory Visit | Attending: Cardiology | Admitting: Cardiology

## 2018-10-30 ENCOUNTER — Encounter (HOSPITAL_COMMUNITY)
Admission: RE | Admit: 2018-10-30 | Discharge: 2018-10-30 | Disposition: A | Discharge status: Home / Self Care | Payer: Self-pay | Source: Ambulatory Visit | Attending: Cardiology | Admitting: Cardiology

## 2018-11-03 ENCOUNTER — Encounter (HOSPITAL_COMMUNITY)
Admission: RE | Admit: 2018-11-03 | Discharge: 2018-11-03 | Disposition: A | Discharge status: Home / Self Care | Payer: Medicare Other | Source: Ambulatory Visit | Attending: Cardiology | Admitting: Cardiology

## 2018-11-03 DIAGNOSIS — Z539 Procedure and treatment not carried out, unspecified reason: Secondary | ICD-10-CM | POA: Insufficient documentation

## 2018-11-03 DIAGNOSIS — I2510 Atherosclerotic heart disease of native coronary artery without angina pectoris: Secondary | ICD-10-CM | POA: Insufficient documentation

## 2018-11-05 ENCOUNTER — Encounter (HOSPITAL_COMMUNITY)
Admission: RE | Admit: 2018-11-05 | Discharge: 2018-11-05 | Disposition: A | Discharge status: Home / Self Care | Payer: Medicare Other | Source: Ambulatory Visit | Attending: Cardiology | Admitting: Cardiology

## 2018-11-06 ENCOUNTER — Encounter (HOSPITAL_COMMUNITY)
Admission: RE | Admit: 2018-11-06 | Discharge: 2018-11-06 | Disposition: A | Discharge status: Home / Self Care | Payer: Medicare Other | Source: Ambulatory Visit | Attending: Cardiology | Admitting: Cardiology

## 2018-11-10 ENCOUNTER — Encounter (HOSPITAL_COMMUNITY)
Admission: RE | Admit: 2018-11-10 | Discharge: 2018-11-10 | Disposition: A | Discharge status: Home / Self Care | Payer: Medicare Other | Source: Ambulatory Visit | Attending: Cardiology | Admitting: Cardiology

## 2018-11-12 ENCOUNTER — Encounter (HOSPITAL_COMMUNITY)
Admission: RE | Admit: 2018-11-12 | Discharge: 2018-11-12 | Disposition: A | Discharge status: Home / Self Care | Payer: Medicare Other | Source: Ambulatory Visit | Attending: Cardiology | Admitting: Cardiology

## 2018-11-12 ENCOUNTER — Other Ambulatory Visit: Payer: Self-pay

## 2018-11-13 ENCOUNTER — Encounter (HOSPITAL_COMMUNITY): Payer: Self-pay

## 2018-11-16 ENCOUNTER — Telehealth (HOSPITAL_COMMUNITY): Payer: Self-pay | Admitting: Cardiac Rehabilitation

## 2018-11-16 NOTE — Telephone Encounter (Signed)
Phone call to inform pt of Outpatient Cardiac Rehab closure for 2 weeks. LM on answering machine. Andi Hence, RN, BSN Cardiac Pulmonary Rehab

## 2018-11-17 ENCOUNTER — Encounter (HOSPITAL_COMMUNITY): Payer: Self-pay

## 2018-11-19 ENCOUNTER — Encounter (HOSPITAL_COMMUNITY): Payer: Self-pay

## 2018-11-20 ENCOUNTER — Encounter (HOSPITAL_COMMUNITY): Payer: Self-pay

## 2018-11-24 ENCOUNTER — Encounter (HOSPITAL_COMMUNITY): Payer: Self-pay

## 2018-11-24 ENCOUNTER — Telehealth (HOSPITAL_COMMUNITY): Payer: Self-pay | Admitting: *Deleted

## 2018-11-26 ENCOUNTER — Encounter (HOSPITAL_COMMUNITY): Payer: Self-pay

## 2018-11-27 ENCOUNTER — Encounter (HOSPITAL_COMMUNITY): Payer: Self-pay

## 2018-11-28 ENCOUNTER — Other Ambulatory Visit: Payer: Self-pay | Admitting: Cardiology

## 2018-11-30 LAB — HM DIABETES EYE EXAM

## 2018-12-01 ENCOUNTER — Encounter (HOSPITAL_COMMUNITY): Payer: Self-pay

## 2018-12-03 ENCOUNTER — Encounter (HOSPITAL_COMMUNITY): Payer: Self-pay

## 2018-12-04 ENCOUNTER — Encounter (HOSPITAL_COMMUNITY): Payer: Self-pay

## 2018-12-07 ENCOUNTER — Telehealth: Payer: Self-pay

## 2018-12-07 MED ORDER — APIXABAN 5 MG PO TABS: 5.0000 mg | ORAL_TABLET | Freq: Two times a day (BID) | ORAL | 1 refills | Status: DC

## 2018-12-07 NOTE — Telephone Encounter (Signed)
rx sent to [facility] mail order

## 2018-12-08 ENCOUNTER — Encounter (HOSPITAL_COMMUNITY): Payer: Self-pay

## 2018-12-10 ENCOUNTER — Encounter (HOSPITAL_COMMUNITY): Payer: Self-pay

## 2018-12-11 ENCOUNTER — Encounter (HOSPITAL_COMMUNITY): Payer: Self-pay

## 2018-12-15 ENCOUNTER — Encounter (HOSPITAL_COMMUNITY): Payer: Self-pay

## 2018-12-17 ENCOUNTER — Encounter (HOSPITAL_COMMUNITY): Payer: Self-pay

## 2018-12-18 ENCOUNTER — Encounter (HOSPITAL_COMMUNITY): Payer: Self-pay

## 2018-12-22 ENCOUNTER — Encounter (HOSPITAL_COMMUNITY): Payer: Self-pay

## 2018-12-24 ENCOUNTER — Encounter (HOSPITAL_COMMUNITY): Payer: Self-pay

## 2018-12-25 ENCOUNTER — Encounter (HOSPITAL_COMMUNITY): Payer: Self-pay

## 2018-12-29 ENCOUNTER — Encounter (HOSPITAL_COMMUNITY): Payer: Self-pay

## 2018-12-31 ENCOUNTER — Encounter (HOSPITAL_COMMUNITY): Payer: Self-pay

## 2019-01-01 ENCOUNTER — Encounter (HOSPITAL_COMMUNITY): Payer: Self-pay

## 2019-01-05 ENCOUNTER — Encounter (HOSPITAL_COMMUNITY): Payer: Self-pay

## 2019-01-07 ENCOUNTER — Encounter (HOSPITAL_COMMUNITY): Payer: Self-pay

## 2019-01-08 ENCOUNTER — Encounter (HOSPITAL_COMMUNITY): Payer: Self-pay

## 2019-01-12 ENCOUNTER — Encounter (HOSPITAL_COMMUNITY): Payer: Self-pay

## 2019-01-14 ENCOUNTER — Encounter (HOSPITAL_COMMUNITY): Payer: Self-pay

## 2019-01-15 ENCOUNTER — Encounter (HOSPITAL_COMMUNITY): Payer: Self-pay

## 2019-01-19 ENCOUNTER — Encounter (HOSPITAL_COMMUNITY): Payer: Self-pay

## 2019-01-21 ENCOUNTER — Encounter (HOSPITAL_COMMUNITY): Payer: Self-pay

## 2019-01-22 ENCOUNTER — Encounter (HOSPITAL_COMMUNITY): Payer: Self-pay

## 2019-01-26 ENCOUNTER — Encounter (HOSPITAL_COMMUNITY): Payer: Self-pay

## 2019-01-27 ENCOUNTER — Other Ambulatory Visit: Payer: Self-pay | Admitting: Cardiology

## 2019-01-28 ENCOUNTER — Encounter (HOSPITAL_COMMUNITY): Payer: Self-pay

## 2019-01-28 ENCOUNTER — Telehealth: Payer: Self-pay | Admitting: Cardiology

## 2019-01-28 NOTE — Telephone Encounter (Signed)
Sent My-Chart message

## 2019-01-29 ENCOUNTER — Encounter (HOSPITAL_COMMUNITY): Payer: Self-pay

## 2019-02-02 ENCOUNTER — Encounter (HOSPITAL_COMMUNITY): Payer: Self-pay

## 2019-02-02 ENCOUNTER — Ambulatory Visit: Payer: Medicare Other | Admitting: Cardiology

## 2019-02-04 ENCOUNTER — Encounter (HOSPITAL_COMMUNITY): Payer: Self-pay

## 2019-02-05 ENCOUNTER — Encounter (HOSPITAL_COMMUNITY): Payer: Self-pay

## 2019-02-08 ENCOUNTER — Telehealth: Payer: Self-pay | Admitting: Cardiology

## 2019-02-09 ENCOUNTER — Encounter (HOSPITAL_COMMUNITY): Payer: Self-pay

## 2019-02-11 ENCOUNTER — Encounter (HOSPITAL_COMMUNITY): Payer: Self-pay

## 2019-02-12 ENCOUNTER — Encounter (HOSPITAL_COMMUNITY): Payer: Self-pay

## 2019-02-16 ENCOUNTER — Ambulatory Visit (INDEPENDENT_AMBULATORY_CARE_PROVIDER_SITE_OTHER): Payer: Medicare Other | Admitting: Family Medicine

## 2019-02-16 ENCOUNTER — Other Ambulatory Visit: Payer: Self-pay

## 2019-02-16 ENCOUNTER — Ambulatory Visit: Payer: Medicare Other | Admitting: Family Medicine

## 2019-02-16 ENCOUNTER — Ambulatory Visit (INDEPENDENT_AMBULATORY_CARE_PROVIDER_SITE_OTHER): Payer: Medicare Other | Admitting: *Deleted

## 2019-02-16 ENCOUNTER — Encounter (HOSPITAL_COMMUNITY): Payer: Self-pay

## 2019-02-16 ENCOUNTER — Encounter: Payer: Self-pay | Admitting: Family Medicine

## 2019-02-16 VITALS — BP 150/73 | Temp 98.8°F | Ht 68.0 in | Wt 192.0 lb

## 2019-02-16 DIAGNOSIS — E782 Mixed hyperlipidemia: Secondary | ICD-10-CM | POA: Diagnosis not present

## 2019-02-16 DIAGNOSIS — E1159 Type 2 diabetes mellitus with other circulatory complications: Secondary | ICD-10-CM

## 2019-02-16 DIAGNOSIS — I10 Essential (primary) hypertension: Secondary | ICD-10-CM

## 2019-02-16 LAB — CBC WITH DIFFERENTIAL/PLATELET
Basophils Absolute: 0.1 10*3/uL (ref 0.0–0.1)
Basophils Relative: 0.9 % (ref 0.0–3.0)
Eosinophils Absolute: 0.3 10*3/uL (ref 0.0–0.7)
Eosinophils Relative: 3.2 % (ref 0.0–5.0)
HCT: 41.4 % (ref 39.0–52.0)
Hemoglobin: 13.9 g/dL (ref 13.0–17.0)
Lymphocytes Relative: 30.1 % (ref 12.0–46.0)
Lymphs Abs: 2.5 10*3/uL (ref 0.7–4.0)
MCHC: 33.5 g/dL (ref 30.0–36.0)
MCV: 90.6 fl (ref 78.0–100.0)
Monocytes Absolute: 0.9 10*3/uL (ref 0.1–1.0)
Monocytes Relative: 10.7 % (ref 3.0–12.0)
Neutro Abs: 4.5 10*3/uL (ref 1.4–7.7)
Neutrophils Relative %: 55.1 % (ref 43.0–77.0)
Platelets: 184 10*3/uL (ref 150.0–400.0)
RBC: 4.57 Mil/uL (ref 4.22–5.81)
RDW: 14.1 % (ref 11.5–15.5)
WBC: 8.2 10*3/uL (ref 4.0–10.5)

## 2019-02-16 LAB — HEPATIC FUNCTION PANEL
ALT: 23 U/L (ref 0–53)
AST: 20 U/L (ref 0–37)
Albumin: 4.4 g/dL (ref 3.5–5.2)
Alkaline Phosphatase: 69 U/L (ref 39–117)
Bilirubin, Direct: 0.1 mg/dL (ref 0.0–0.3)
Total Bilirubin: 0.6 mg/dL (ref 0.2–1.2)
Total Protein: 6.5 g/dL (ref 6.0–8.3)

## 2019-02-16 LAB — BASIC METABOLIC PANEL
BUN: 17 mg/dL (ref 6–23)
CO2: 29 mEq/L (ref 19–32)
Calcium: 9.4 mg/dL (ref 8.4–10.5)
Chloride: 104 mEq/L (ref 96–112)
Creatinine, Ser: 0.91 mg/dL (ref 0.40–1.50)
GFR: 80.55 mL/min (ref >60.00)
Glucose, Bld: 109 mg/dL — ABNORMAL HIGH (ref 70–99)
Potassium: 4 mEq/L (ref 3.5–5.1)
Sodium: 141 mEq/L (ref 135–145)

## 2019-02-16 LAB — LIPID PANEL
Cholesterol: 96 mg/dL (ref 0–200)
HDL: 37.8 mg/dL — ABNORMAL LOW (ref >39.00)
NonHDL: 58.52
Total CHOL/HDL Ratio: 3
Triglycerides: 261 mg/dL — ABNORMAL HIGH (ref 0.0–149.0)
VLDL: 52.2 mg/dL — ABNORMAL HIGH (ref 0.0–40.0)

## 2019-02-16 LAB — LDL CHOLESTEROL, DIRECT: Direct LDL: 40 mg/dL

## 2019-02-16 LAB — TSH: TSH: 2.11 u[IU]/mL (ref 0.35–4.50)

## 2019-02-16 LAB — HEMOGLOBIN A1C: Hgb A1c MFr Bld: 6.9 % — ABNORMAL HIGH (ref 4.6–6.5)

## 2019-02-16 NOTE — Progress Notes (Signed)
I have discussed the procedure for the virtual visit with the patient who has given consent to proceed with assessment and treatment.   Alette Kataoka L Nghia Mcentee, CMA     

## 2019-02-16 NOTE — Progress Notes (Signed)
Virtual Visit via Video   I connected with patient on 02/16/19 at  9:30 AM EDT by a video enabled telemedicine application and verified that I am speaking with the correct person using two identifiers.  Location patient: Home Location provider: Acupuncturist, Office Persons participating in the virtual visit: Patient, Provider, [place] (Jess B)  I discussed the limitations of evaluation and management by telemedicine and the availability of in person appointments. The patient expressed understanding and agreed to proceed.  Subjective:   HPI:   HTN- chronic problem, on Amlodipine 2.5mg  daily, Coreg 12.5mg  1.5 tabs BID, Losartan 100mg .  Pt's BP is elevated today but he took BP at same time he took meds.  No CP, SOB, HAs, visual changes, edema.  Hyperlipidemia- chronic problem, on Simvastatin 40mg  daily.  Pt is down 11 lbs since last visit.  No abd pain, N/V  DM- chronic problem, on Metformin 500mg  BID.  On ARB for renal protection.  Overdue for eye exam.  Due for foot exam.  Pt is down 11 lbs by changing diet.  Denies symptomatic lows.  No numbness/tingling of hands/feet.  ROS:   See pertinent positives and negatives per HPI.  Patient Active Problem List   Diagnosis Date Noted  . Typical atrial flutter ([place])   . Medicare annual wellness visit, subsequent 01/31/2017  . Acute left-sided low back pain 09/03/2016  . History of colonic polyps 07/24/2015  . 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([place])   . Hypotension 01/12/2015  . Atrial fibrillation ([place]) 07/13/2013  . Obstructive sleep apnea 07/19/2010  . PREMATURE VENTRICULAR CONTRACTIONS, FREQUENT 01/31/2010  . 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 Hyperlipidemia 10/20/2008  . HYPERTENSION, BENIGN 10/20/2008  . CAD, NATIVE VESSEL 10/20/2008  . VENTRAL HERNIA 01/08/2008  . ERECTILE DYSFUNCTION 07/23/2007    Social History   Tobacco Use  . Smoking status: Former Smoker    Packs/day: 1.00    Quit date: 09/02/1990    Years  since quitting: 28.4  . Smokeless tobacco: Never Used  . Tobacco comment: onset age [age] up to 1 ppd  Substance Use Topics  . Alcohol use: No    Current Outpatient Medications:  .  amLODipine (NORVASC) 2.5 MG tablet, TAKE 1 TABLET EVERY DAY (NEED MD APPOINTMENT), Disp: 90 tablet, Rfl: 2 .  apixaban (ELIQUIS) 5 MG TABS tablet, Take 1 tablet (5 mg total) by mouth 2 (two) times daily., Disp: 180 tablet, Rfl: 1 .  carvedilol (COREG) 12.5 MG tablet, TAKE 1 AND 1/2 TABLETS TWICE DAILY, Disp: 270 tablet, Rfl: 1 .  EPINEPHrine 0.3 mg/0.3 mL IJ SOAJ injection, Inject 0.3 mLs (0.3 mg total) into the muscle once., Disp: 1 Device, Rfl: 3 .  fish oil-omega-3 fatty acids 1000 MG capsule, Take 2 g by mouth daily.  , Disp: , Rfl:  .  losartan (COZAAR) 100 MG tablet, TAKE 1 TABLET EVERY DAY, Disp: 90 tablet, Rfl: 2 .  metFORMIN (GLUCOPHAGE) 500 MG tablet, TAKE 1 TABLET BY MOUTH 2 (TWO) TIMES DAILY WITH A MEAL., Disp: 180 tablet, Rfl: 2 .  Multiple Vitamin (MULTIVITAMIN) tablet, Take 1 tablet by mouth daily., Disp: , Rfl:  .  nitroGLYCERIN (NITROSTAT) 0.4 MG SL tablet, Place 1 tablet (0.4 mg total) under the tongue every 5 (five) minutes as needed., Disp: 25 tablet, Rfl: 11 .  Saw Palmetto, Serenoa repens, (SAW PALMETTO PO), Take 1 tablet by mouth as needed (prostate). , Disp: , Rfl:  .  simvastatin (ZOCOR) 40 MG tablet, TAKE 1 TABLET AT  BEDTIME, Disp: 90 tablet, Rfl: 2 .  tiZANidine (ZANAFLEX) 4 MG tablet, Take 1 tablet (4 mg total) by mouth every 8 (eight) hours as needed for muscle spasms., Disp: 45 tablet, Rfl: 0 .  diclofenac sodium (VOLTAREN) 1 % GEL, Apply 4 g topically 4 (four) times daily., Disp: 100 g, Rfl: 1  Allergies  Allergen Reactions  . Acetaminophen Other (See Comments)    Drug induced hepatitis  . Oxycodone-Acetaminophen Other (See Comments)    Drug induced hepatitis  . Flomax [Tamsulosin Hcl] Other (See Comments)    incontinence    Objective:   BP (!) 150/73 Comment: had just  taken meds  Temp 98.8 F (37.1 C) (Oral)   Ht 5\' 8"  (1.727 m)   Wt 192 lb (87.1 kg)   BMI 29.19 kg/m  AAOx3, NAD NCAT, EOMI No obvious CN deficits Coloring WNL Pt is able to speak clearly, coherently without shortness of breath or increased work of breathing.  Thought process is linear.  Mood is appropriate.   Assessment and Plan:   HTN- BP is elevated today but he had just taken his medication.  Will repeat BP at lab visit.  Suspect it will be WNL at that time.  Hyperlipidemia- chronic problem.  Tolerating statin w/o difficulty.  Check labs.  Adjust meds prn   DM- chronic problem.  Applauded his weight loss efforts and dietary changes.  Due for eye exam, foot exam.  On ARB.  Check labs.  Adjust meds prn    Annye Asa, MD 02/16/2019

## 2019-02-18 ENCOUNTER — Encounter (HOSPITAL_COMMUNITY): Payer: Self-pay

## 2019-02-19 ENCOUNTER — Encounter (HOSPITAL_COMMUNITY): Payer: Self-pay

## 2019-02-23 ENCOUNTER — Encounter (HOSPITAL_COMMUNITY): Payer: Self-pay

## 2019-02-25 ENCOUNTER — Encounter (HOSPITAL_COMMUNITY): Payer: Self-pay

## 2019-02-26 ENCOUNTER — Encounter (HOSPITAL_COMMUNITY): Payer: Self-pay

## 2019-03-02 ENCOUNTER — Telehealth (HOSPITAL_COMMUNITY): Payer: Self-pay

## 2019-03-02 ENCOUNTER — Encounter (HOSPITAL_COMMUNITY): Payer: Self-pay

## 2019-03-04 ENCOUNTER — Encounter: Payer: Self-pay | Admitting: *Deleted

## 2019-03-04 ENCOUNTER — Telehealth: Payer: Self-pay | Admitting: Cardiology

## 2019-03-04 ENCOUNTER — Encounter (HOSPITAL_COMMUNITY): Payer: Self-pay

## 2019-03-04 ENCOUNTER — Encounter: Payer: Self-pay | Admitting: Cardiology

## 2019-03-04 NOTE — Telephone Encounter (Signed)
New Message    1. What dental office are you calling from? [facility]   2. What is your office phone number?       [phone]  3. What is your fax number? [phone]  4. What type of procedure is the patient having performed?  Dental Extractions  5. What date is procedure scheduled or is the patient there now? 03/15/2019 (if the patient is at the dentist's office question goes to their cardiologist if he/she is in the office.  If not, question should go to the DOD).   6. What is your question (ex. Antibiotics prior to procedure, holding medication-we need to know how long dentist wants pt to hold med)? Does he need to stop blood thinners, does patient need antibiotics prior to extraction, can dentist use epinephrine.   Dentist office will be faxing form over to [employer] to be filled out.

## 2019-03-04 NOTE — Telephone Encounter (Signed)
   Primary Cardiologist: Dr Martinique  Chart reviewed as part of pre-operative protocol coverage. Simple dental extractions are considered low risk procedures per guidelines and generally do not require any specific cardiac clearance. It is also generally accepted that for simple extractions and dental cleanings, there is no need to interrupt blood thinner therapy.   SBE prophylaxis is required for the patient.  I will route this recommendation to the requesting party via Epic fax function and remove from pre-op pool.  Please call with questions.  Kerin Ransom, PA-C 03/04/2019, 1:57 PM

## 2019-03-04 NOTE — Telephone Encounter (Signed)
error 

## 2019-03-04 NOTE — Telephone Encounter (Signed)
This encounter was created in error - please disregard.

## 2019-03-07 NOTE — Progress Notes (Signed)
Jacob Curry Date of Birth: November 17, 1940 Medical Record #[id number]  History of Present Illness: Jacob Curry is seen for follow up CAD. He has a known history of coronary disease with cardiac catheterization in November 2007 showing total occlusion of a large left circumflex vessel with good left to left collaterals. There was 40% disease in the left main and origin of LAD. 90% mid diagonal, 80-90% mid RCA and 90% RV marginal branch.  Ejection fraction was 50-55%. Prior to this he had a nuclear stress test- he was able to exercise 8:30 with severe ischemia of the inferolateral wall. He has been managed medically. Repeat cardiac cath in May 2011 and May 2016 showed no significant change. He has a history of hypertension, hyperlipidemia, Pafib,  diabetes. He has a history of moderate obstructive sleep apnea. This is managed with an oral appliance. He was unable to tolerate CPAP therapy.  He is anticoagulated with Eliquis.  In December 2018 he developed Afib. Was evaluated in Afib clinic and underwent successful DCCV on 09/16/17. He thinks this was related to getting a steroid shot for bronchitis.   On follow up today he is doing well. Denies any  any chest pain, dyspnea, palpitations, edema.   A1c up to 6.8%. He is trying to do better with his diet. He is walking regularly at home.  He is having to spend more time now caring for his wife.   Current Outpatient Medications on File Prior to Visit  Medication Sig Dispense Refill  . amLODipine (NORVASC) 2.5 MG tablet TAKE 1 TABLET EVERY DAY (NEED MD APPOINTMENT) 90 tablet 2  . apixaban (ELIQUIS) 5 MG TABS tablet Take 1 tablet (5 mg total) by mouth 2 (two) times daily. 180 tablet 1  . carvedilol (COREG) 12.5 MG tablet TAKE 1 AND 1/2 TABLETS TWICE DAILY 270 tablet 1  . EPINEPHrine 0.3 mg/0.3 mL IJ SOAJ injection Inject 0.3 mLs (0.3 mg total) into the muscle once. 1 Device 3  . fish oil-omega-3 fatty acids 1000 MG capsule Take 2 g by mouth daily.      Marland Kitchen losartan  (COZAAR) 100 MG tablet TAKE 1 TABLET EVERY DAY 90 tablet 2  . metFORMIN (GLUCOPHAGE) 500 MG tablet TAKE 1 TABLET BY MOUTH 2 (TWO) TIMES DAILY WITH A MEAL. 180 tablet 2  . Multiple Vitamin (MULTIVITAMIN) tablet Take 1 tablet by mouth daily.    . nitroGLYCERIN (NITROSTAT) 0.4 MG SL tablet Place 1 tablet (0.4 mg total) under the tongue every 5 (five) minutes as needed. 25 tablet 11  . Saw Palmetto, Serenoa repens, (SAW PALMETTO PO) Take 1 tablet by mouth as needed (prostate).     . simvastatin (ZOCOR) 40 MG tablet TAKE 1 TABLET AT BEDTIME 90 tablet 2  . tiZANidine (ZANAFLEX) 4 MG tablet Take 1 tablet (4 mg total) by mouth every 8 (eight) hours as needed for muscle spasms. 45 tablet 0   No current facility-administered medications on file prior to visit.     Allergies  Allergen Reactions  . Acetaminophen Other (See Comments)    Drug induced hepatitis  . Oxycodone-Acetaminophen Other (See Comments)    Drug induced hepatitis  . Flomax [Tamsulosin Hcl] Other (See Comments)    incontinence    Past Medical History:  Diagnosis Date  . CAD (coronary artery disease)    Dr Jacob Curry  . Carotid stenosis   . Complication of anesthesia    " DIFFICULTY WAKING "  . DM (diabetes mellitus) ([place])   . ED (erectile  dysfunction)   . HLD (hyperlipidemia)   . HTN (hypertension)   . Myocardial infarction ([place]) 2007  . Obesity   . OSA (obstructive sleep apnea)    oral appliance, Dr Jacob Curry  . Paroxysmal atrial fibrillation [facility])     Past Surgical History:  Procedure Laterality Date  . [place]  . APPENDECTOMY  1958  . CARDIAC CATHETERIZATION  2007 & 2011  . CARDIAC CATHETERIZATION N/A 01/13/2015   Procedure: Left Heart Cath and Coronary Angiography;  Surgeon: Jacob Kalan M Martinique, MD;  Location: [facility];  Service: Cardiovascular;  Laterality: N/A;  . CARDIOVERSION N/A 09/16/2017   Procedure: CARDIOVERSION;  Surgeon: Jacob Spark, MD;  Location: [facility];  Service:  Cardiovascular;  Laterality: N/A;  . COLONOSCOPY  2002   negative; Dr Jacob Curry  . [place]  . TONSILLECTOMY      Social History   Tobacco Use  Smoking Status Former Smoker  . Packs/day: 1.00  . Quit date: 09/02/1990  . Years since quitting: 28.5  Smokeless Tobacco Never Used  Tobacco Comment   onset age [age] up to 1 ppd    Social History   Substance and Sexual Activity  Alcohol Use No    Family History  Problem Relation Age of Onset  . Diabetes Mother   . Heart attack Father         4 vessel CBAG @ 59  . Colon cancer Paternal Uncle   . Diabetes Brother   . Stroke Neg Hx     Review of Systems: As noted in history of present illness.  All other systems were reviewed and are negative.  Physical Exam: BP (!) 143/76   Pulse 61   Ht 5\' 8"  (1.727 m)   Wt 197 lb 3.2 oz (89.4 kg)   SpO2 96%   BMI 29.98 kg/m  GENERAL:  Well appearing obese WM in NAD HEENT:  PERRL, EOMI, sclera are clear. Oropharynx is clear. NECK:  No jugular venous distention, carotid upstroke brisk and symmetric, no bruits, no thyromegaly or adenopathy LUNGS:  Clear to auscultation bilaterally CHEST:  Unremarkable HEART:  RRR,  PMI not displaced or sustained,S1 and S2 within normal limits, no S3, no S4: no clicks, no rubs, no murmurs ABD:  Soft, nontender. BS +, no masses or bruits. No hepatomegaly, no splenomegaly EXT:  2 + pulses throughout, no edema, no cyanosis no clubbing SKIN:  Warm and dry.  No rashes NEURO:  Alert and oriented x 3. Cranial nerves II through XII intact. PSYCH:  Cognitively intact   LABORATORY DATA: Lab Results  Component Value Date   WBC 8.2 02/16/2019   HGB 13.9 02/16/2019   HCT 41.4 02/16/2019   PLT 184.0 02/16/2019   GLUCOSE 109 (H) 02/16/2019   CHOL 96 02/16/2019   TRIG 261.0 (H) 02/16/2019   HDL 37.80 (L) 02/16/2019   LDLDIRECT 40.0 02/16/2019   LDLCALC 26 06/11/2018   ALT 23 02/16/2019   AST 20 02/16/2019   NA 141 02/16/2019   K 4.0 02/16/2019    CL 104 02/16/2019   CREATININE 0.91 02/16/2019   BUN 17 02/16/2019   CO2 29 02/16/2019   TSH 2.11 02/16/2019   PSA 1.70 01/04/2009   INR 1.75 (H) 01/12/2015   HGBA1C 6.9 (H) 02/16/2019   MICROALBUR 0.4 03/09/2012   Ecg today shows NSR with possible old anterior infarct. I have personally reviewed and interpreted this study.  Assessment / Plan: 1. Coronary disease with  chronic total occlusion of the left circumflex. Moderate to severe 3 vessel disease. Patient remains asymptomatic on medical therapy.   Will continue medical therapy. Follow up in 6 months.  2. Hypertension, generally well controlled.  A little high today. Will monitor.  3. Hyperlipidemia well controlled on medication.  LDL 26 on Zocor.  4. Atrial fibrillation, paroxysmal. S/p DCCV on 09/16/17.   He has a Chadvasc score of 4. Continue anticoagulation with Eliquis 5 mg twice a day.   5. DM type 2. On metformin at higher dose. Encourage better eating habits and weight loss. If additional therapy is needed would consider an SGLT 2 inhibitor.     I will follow up in 6 months.

## 2019-03-09 ENCOUNTER — Encounter (HOSPITAL_COMMUNITY): Payer: Self-pay

## 2019-03-10 ENCOUNTER — Telehealth: Payer: Self-pay | Admitting: Cardiology

## 2019-03-10 NOTE — Telephone Encounter (Signed)

## 2019-03-11 ENCOUNTER — Other Ambulatory Visit: Payer: Self-pay

## 2019-03-11 ENCOUNTER — Encounter (HOSPITAL_COMMUNITY): Payer: Self-pay

## 2019-03-11 ENCOUNTER — Ambulatory Visit (INDEPENDENT_AMBULATORY_CARE_PROVIDER_SITE_OTHER): Payer: Medicare Other | Admitting: Cardiology

## 2019-03-11 ENCOUNTER — Telehealth: Payer: Self-pay

## 2019-03-11 ENCOUNTER — Encounter: Payer: Self-pay | Admitting: Cardiology

## 2019-03-11 VITALS — BP 143/76 | HR 61 | Ht 68.0 in | Wt 197.2 lb

## 2019-03-11 DIAGNOSIS — I480 Paroxysmal atrial fibrillation: Secondary | ICD-10-CM | POA: Diagnosis not present

## 2019-03-11 DIAGNOSIS — E7800 Pure hypercholesterolemia, unspecified: Secondary | ICD-10-CM

## 2019-03-11 DIAGNOSIS — I2510 Atherosclerotic heart disease of native coronary artery without angina pectoris: Secondary | ICD-10-CM

## 2019-03-11 NOTE — Telephone Encounter (Signed)
Spoke to patient Dr.Jordan advised you do not need SBE prophylaxis for upcoming tooth extraction.Also you do not need to hold Eliquis.Note faxed to Arita Miss D[facility] at fax # [phone].

## 2019-03-12 ENCOUNTER — Encounter (HOSPITAL_COMMUNITY): Payer: Self-pay

## 2019-03-16 ENCOUNTER — Encounter (HOSPITAL_COMMUNITY): Payer: Self-pay

## 2019-03-18 ENCOUNTER — Encounter (HOSPITAL_COMMUNITY): Payer: Self-pay

## 2019-03-19 ENCOUNTER — Encounter (HOSPITAL_COMMUNITY): Payer: Self-pay

## 2019-03-23 ENCOUNTER — Encounter (HOSPITAL_COMMUNITY): Payer: Self-pay

## 2019-03-25 ENCOUNTER — Telehealth: Payer: Self-pay | Admitting: *Deleted

## 2019-03-25 ENCOUNTER — Encounter (HOSPITAL_COMMUNITY): Payer: Self-pay

## 2019-03-25 NOTE — Telephone Encounter (Signed)
Patient called in and left a VM that last week he had dental work done and that night a clot broke loose and he had a "massive" bleed for 7 hours.  He finally got back in to the dentist and they have checked out the area. He has a friend that is  Nurse who suggested he call his PCP to see if she would be willing to order labs just to have his hemoglobin checked since he bled so much.  He is calling in asking if this is something that could be done, or if Dr. Birdie Riddle feels that it is necessary.   Routing to PCP

## 2019-03-26 ENCOUNTER — Encounter (HOSPITAL_COMMUNITY): Payer: Self-pay

## 2019-03-26 NOTE — Telephone Encounter (Signed)
Called pt and he advised that he wanted to wait to have labs drawn.

## 2019-03-26 NOTE — Telephone Encounter (Signed)
It would be unusual for a dental bleed to cause significant anemia but if he is symptomatic- fatigue, dizziness, pale in color we are happy to check a CBC.  If he is not symptomatic but would like to be sure, we can still check at a lab visit.

## 2019-03-30 ENCOUNTER — Encounter (HOSPITAL_COMMUNITY): Payer: Self-pay

## 2019-04-01 ENCOUNTER — Encounter (HOSPITAL_COMMUNITY): Payer: Self-pay

## 2019-04-02 ENCOUNTER — Encounter (HOSPITAL_COMMUNITY): Payer: Self-pay

## 2019-05-06 ENCOUNTER — Other Ambulatory Visit: Payer: Self-pay | Admitting: Cardiology

## 2019-05-11 ENCOUNTER — Telehealth: Payer: Self-pay | Admitting: Cardiology

## 2019-05-11 MED ORDER — CARVEDILOL 12.5 MG PO TABS: ORAL_TABLET | ORAL | 0 refills | Status: DC

## 2019-05-11 NOTE — Telephone Encounter (Signed)
New message   Pt c/o medication issue:  1. Name of Medication:   carvedilol (COREG) 12.5 MG tablet     2. How are you currently taking this medication (dosage and times per day)? Twice daily  3. Are you having a reaction (difficulty breathing--STAT)? No   4. What is your medication issue? Patient needs a new prescription for this medication sent to [facility], [place] - [address].The patient's wife states that there is a delay in receiving the medicine by mail through [facility] mail order.  Please see if [facility]'s [facility]

## 2019-05-11 NOTE — Telephone Encounter (Signed)
Rx(s) sent to pharmacy electronically.  

## 2019-05-19 DIAGNOSIS — Z23 Encounter for immunization: Secondary | ICD-10-CM | POA: Diagnosis not present

## 2019-05-28 ENCOUNTER — Other Ambulatory Visit: Payer: Self-pay | Admitting: Cardiology

## 2019-05-28 ENCOUNTER — Telehealth: Payer: Self-pay | Admitting: Cardiology

## 2019-05-28 NOTE — Telephone Encounter (Signed)
New Message  Patient calling the office for samples of medication:   1.  What medication and dosage are you requesting samples for? apixaban (ELIQUIS) 5 MG TABS tablet  Take 1 tablet (5 mg total) by mouth 2 (two) times daily.  2.  Are you currently out of this medication? Almost, has 1 week supply left.

## 2019-05-28 NOTE — Telephone Encounter (Signed)
New Message    *STAT* If patient is at the pharmacy, call can be transferred to refill team.   1. Which medications need to be refilled? (please list name of each medication and dose if known) apixaban (ELIQUIS) 5 MG TABS tablet  2. Which pharmacy/location (including street and city if local pharmacy) is medication to be sent to? [facility] [place], [place] [facility] [address]  3. Do they need a 30 day or 90 day supply? 30 day

## 2019-05-31 ENCOUNTER — Other Ambulatory Visit: Payer: Self-pay | Admitting: Family Medicine

## 2019-06-01 ENCOUNTER — Other Ambulatory Visit: Payer: Self-pay

## 2019-06-01 MED ORDER — APIXABAN 5 MG PO TABS: 5.0000 mg | ORAL_TABLET | Freq: Two times a day (BID) | ORAL | 1 refills | Status: DC

## 2019-06-01 NOTE — Telephone Encounter (Signed)
REFILL RESPONDED TO BY OTHER MEANS

## 2019-06-01 NOTE — Telephone Encounter (Signed)
Patient notified that we do not have any Eliquis samples at this time. Offered to send a rx to the pharmacy, but patient stated he already had a rx and would request samples at a later time.

## 2019-06-02 ENCOUNTER — Ambulatory Visit (INDEPENDENT_AMBULATORY_CARE_PROVIDER_SITE_OTHER): Payer: Medicare Other | Admitting: Family Medicine

## 2019-06-02 ENCOUNTER — Encounter: Payer: Self-pay | Admitting: Family Medicine

## 2019-06-02 ENCOUNTER — Other Ambulatory Visit: Payer: Self-pay

## 2019-06-02 VITALS — BP 123/82 | HR 63 | Temp 97.8°F | Resp 16 | Ht 68.0 in | Wt 192.0 lb

## 2019-06-02 DIAGNOSIS — E1159 Type 2 diabetes mellitus with other circulatory complications: Secondary | ICD-10-CM

## 2019-06-02 DIAGNOSIS — E669 Obesity, unspecified: Secondary | ICD-10-CM | POA: Insufficient documentation

## 2019-06-02 DIAGNOSIS — I2510 Atherosclerotic heart disease of native coronary artery without angina pectoris: Secondary | ICD-10-CM

## 2019-06-02 DIAGNOSIS — E663 Overweight: Secondary | ICD-10-CM | POA: Diagnosis not present

## 2019-06-02 LAB — BASIC METABOLIC PANEL
BUN: 17 mg/dL (ref 6–23)
CO2: 28 mEq/L (ref 19–32)
Calcium: 9.5 mg/dL (ref 8.4–10.5)
Chloride: 106 mEq/L (ref 96–112)
Creatinine, Ser: 0.86 mg/dL (ref 0.40–1.50)
GFR: 85.91 mL/min (ref >60.00)
Glucose, Bld: 108 mg/dL — ABNORMAL HIGH (ref 70–99)
Potassium: 4.1 mEq/L (ref 3.5–5.1)
Sodium: 142 mEq/L (ref 135–145)

## 2019-06-02 LAB — HEMOGLOBIN A1C: Hgb A1c MFr Bld: 6.2 % (ref 4.6–6.5)

## 2019-06-02 NOTE — Assessment & Plan Note (Signed)
Improved.  Pt is down 19 lbs since last visit.  Applauded his efforts.  Will follow.

## 2019-06-02 NOTE — Progress Notes (Signed)
   Subjective:    Patient ID: Jacob Curry, male    DOB: Apr 26, 1941, 78 y.o.   MRN: [id number]  HPI DM- chronic problem, on Metformin 500mg  BID.  UTD on eye exam.  Due for foot exam.  On ARB for renal protection.  Pt is down 5 lbs since last visit and 19 since Feb.  Pt reports changing his diet.  Pt reports feeling good.  No CP, SOB, HAs, visual changes, abd pain, N/V.  Denies symptomatic lows.  No numbness/tingling of hands or feet.  No sores on feet.  Walking 'at least 2 miles weather permitting'.  Overweight- ongoing issue for pt.  Is down 19 lbs since Feb.  Applauded his efforts.   Review of Systems For ROS see HPI     Objective:   Physical Exam Vitals signs reviewed.  Constitutional:      General: He is not in acute distress.    Appearance: Normal appearance. He is well-developed. He is not ill-appearing.  HENT:     Head: Normocephalic and atraumatic.  Eyes:     Conjunctiva/sclera: Conjunctivae normal.     Pupils: Pupils are equal, round, and reactive to light.  Neck:     Musculoskeletal: Normal range of motion and neck supple.     Thyroid: No thyromegaly.  Cardiovascular:     Rate and Rhythm: Normal rate and regular rhythm.     Heart sounds: Normal heart sounds. No murmur.  Pulmonary:     Effort: Pulmonary effort is normal. No respiratory distress.     Breath sounds: Normal breath sounds.  Abdominal:     General: Bowel sounds are normal. There is no distension.     Palpations: Abdomen is soft.  Lymphadenopathy:     Cervical: No cervical adenopathy.  Skin:    General: Skin is warm and dry.  Neurological:     Mental Status: He is alert and oriented to person, place, and time.     Cranial Nerves: No cranial nerve deficit.  Psychiatric:        Behavior: Behavior normal.           Assessment & Plan:

## 2019-06-02 NOTE — Patient Instructions (Signed)
Follow up in 3-4 months to recheck DM, BP, cholesterol We'll notify you of your lab results and make any changes if needed Continue to work on healthy diet and regular exercise- you're doing great! Call with any questions or concerns Stay Safe!!

## 2019-06-02 NOTE — Assessment & Plan Note (Signed)
Chronic problem.  Hx of good control.  UTD on eye exam, on ARB.  Foot exam done today.  Check labs.  Adjust meds prn.  Encouraged continued exercise.  Will follow.

## 2019-06-16 ENCOUNTER — Other Ambulatory Visit: Payer: Self-pay | Admitting: Cardiology

## 2019-07-22 ENCOUNTER — Ambulatory Visit: Payer: Medicare Other | Admitting: Cardiology

## 2019-08-10 ENCOUNTER — Other Ambulatory Visit: Payer: Self-pay | Admitting: Cardiology

## 2019-08-19 LAB — HM DIABETES EYE EXAM

## 2019-08-20 NOTE — Progress Notes (Signed)
[name] Date of Birth: [birth year] Medical Record [id number]  History of Present Illness: Jacob Curry is seen for follow up CAD. He has a known history of coronary disease with cardiac catheterization in November 2007 showing total occlusion of a large left circumflex vessel with good left to left collaterals. There was 40% disease in the left main and origin of LAD. 90% mid diagonal, 80-90% mid RCA and 90% RV marginal branch.  Ejection fraction was 50-55%. Prior to this he had a nuclear stress test- he was able to exercise 8:30 with severe ischemia of the inferolateral wall. He has been managed medically. Repeat cardiac cath in May 2011 and May 2016 showed no significant change. He has a history of hypertension, hyperlipidemia, Pafib,  diabetes. He has a history of moderate obstructive sleep apnea. This is managed with an oral appliance. He was unable to tolerate CPAP therapy.  He is anticoagulated with Eliquis.  In December 2018 he developed Afib. Was evaluated in Afib clinic and underwent successful DCCV on 09/16/17. He thinks this was related to getting a steroid shot for bronchitis.   On follow up today he is doing well. Denies any  any chest pain, dyspnea, palpitations, edema.  His weight is stable.   Current Outpatient Medications on File Prior to Visit  Medication Sig Dispense Refill  . amLODipine (NORVASC) 2.5 MG tablet TAKE 1 TABLET EVERY DAY (NEED MD APPOINTMENT) 90 tablet 2  . apixaban (ELIQUIS) 5 MG TABS tablet Take 1 tablet (5 mg total) by mouth 2 (two) times daily. 180 tablet 1  . carvedilol (COREG) 12.5 MG tablet TAKE 1 AND 1/2 TABLETS TWICE DAILY 270 tablet 1  . EPINEPHrine 0.3 mg/0.3 mL IJ SOAJ injection Inject 0.3 mLs (0.3 mg total) into the muscle once. 1 Device 3  . fish oil-omega-3 fatty acids 1000 MG capsule Take 2 g by mouth daily.      Marland Kitchen losartan (COZAAR) 100 MG tablet TAKE 1 TABLET EVERY DAY 90 tablet 1  . metFORMIN (GLUCOPHAGE) 500 MG tablet TAKE 1 TABLET BY MOUTH 2  (TWO) TIMES DAILY WITH A MEAL. 180 tablet 2  . Multiple Vitamin (MULTIVITAMIN) tablet Take 1 tablet by mouth daily.    . nitroGLYCERIN (NITROSTAT) 0.4 MG SL tablet Place 1 tablet (0.4 mg total) under the tongue every 5 (five) minutes as needed. 25 tablet 11  . Saw Palmetto, Serenoa repens, (SAW PALMETTO PO) Take 1 tablet by mouth as needed (prostate).     . simvastatin (ZOCOR) 40 MG tablet TAKE 1 TABLET AT BEDTIME 90 tablet 1  . tiZANidine (ZANAFLEX) 4 MG tablet Take 1 tablet (4 mg total) by mouth every 8 (eight) hours as needed for muscle spasms. 45 tablet 0   No current facility-administered medications on file prior to visit.    Allergies  Allergen Reactions  . Acetaminophen Other (See Comments)    Drug induced hepatitis  . Oxycodone-Acetaminophen Other (See Comments)    Drug induced hepatitis  . Flomax [Tamsulosin Hcl] Other (See Comments)    incontinence    Past Medical History:  Diagnosis Date  . CAD (coronary artery disease)    Dr Ahnyla Mendel Martinique  . Carotid stenosis   . Complication of anesthesia    " DIFFICULTY WAKING "  . DM (diabetes mellitus) ([place])   . ED (erectile dysfunction)   . HLD (hyperlipidemia)   . HTN (hypertension)   . Myocardial infarction ([place]) 2007  . Obesity   . OSA (obstructive sleep apnea)  oral appliance, Dr Ron Parker  . Paroxysmal atrial fibrillation [facility])     Past Surgical History:  Procedure Laterality Date  . [place]  . APPENDECTOMY  1958  . CARDIAC CATHETERIZATION  2007 & 2011  . CARDIAC CATHETERIZATION N/A 01/13/2015   Procedure: Left Heart Cath and Coronary Angiography;  Surgeon: Katera Rybka M Martinique, MD;  Location: [facility];  Service: Cardiovascular;  Laterality: N/A;  . CARDIOVERSION N/A 09/16/2017   Procedure: CARDIOVERSION;  Surgeon: Dorothy Spark, MD;  Location: [facility];  Service: Cardiovascular;  Laterality: N/A;  . COLONOSCOPY  2002   negative; Dr Olevia Perches  . [place]  . TONSILLECTOMY       Social History   Tobacco Use  Smoking Status Former Smoker  . Packs/day: 1.00  . Quit date: 09/02/1990  . Years since quitting: 28.9  Smokeless Tobacco Never Used  Tobacco Comment   onset age [age] up to 1 ppd    Social History   Substance and Sexual Activity  Alcohol Use No    Family History  Problem Relation Age of Onset  . Diabetes Mother   . Heart attack Father         4 vessel CBAG @ 26  . Colon cancer Paternal Uncle   . Diabetes Brother   . Stroke Neg Hx     Review of Systems: As noted in history of present illness.  All other systems were reviewed and are negative.  Physical Exam: BP 120/70   Pulse (!) 57   Ht 5\' 8"  (1.727 m)   Wt 194 lb 6.4 oz (88.2 kg)   SpO2 98%   BMI 29.56 kg/m  GENERAL:  Well appearing obese WM in NAD HEENT:  PERRL, EOMI, sclera are clear. Oropharynx is clear. NECK:  No jugular venous distention, carotid upstroke brisk and symmetric, no bruits, no thyromegaly or adenopathy LUNGS:  Clear to auscultation bilaterally CHEST:  Unremarkable HEART:  RRR,  PMI not displaced or sustained,S1 and S2 within normal limits, no S3, no S4: no clicks, no rubs, no murmurs ABD:  Soft, nontender. BS +, no masses or bruits. No hepatomegaly, no splenomegaly EXT:  2 + pulses throughout, no edema, no cyanosis no clubbing SKIN:  Warm and dry.  No rashes NEURO:  Alert and oriented x 3. Cranial nerves II through XII intact. PSYCH:  Cognitively intact   LABORATORY DATA: Lab Results  Component Value Date   WBC 8.2 02/16/2019   HGB 13.9 02/16/2019   HCT 41.4 02/16/2019   PLT 184.0 02/16/2019   GLUCOSE 108 (H) 06/02/2019   CHOL 96 02/16/2019   TRIG 261.0 (H) 02/16/2019   HDL 37.80 (L) 02/16/2019   LDLDIRECT 40.0 02/16/2019   LDLCALC 26 06/11/2018   ALT 23 02/16/2019   AST 20 02/16/2019   NA 142 06/02/2019   K 4.1 06/02/2019   CL 106 06/02/2019   CREATININE 0.86 06/02/2019   BUN 17 06/02/2019   CO2 28 06/02/2019   TSH 2.11 02/16/2019   PSA  1.70 01/04/2009   INR 1.75 (H) 01/12/2015   HGBA1C 6.2 06/02/2019   MICROALBUR 0.4 03/09/2012   Ecg is not done today.   Assessment / Plan: 1. 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 Moderate to severe 3 vessel disease. Patient remains asymptomatic on medical therapy.   Will continue medical therapy. Follow up in 6 months.  2. Hypertension,  well controlled.  Continue current therapy  3. Hyperlipidemia  well controlled on medication.  LDL 26 on Zocor.  4. Atrial fibrillation, paroxysmal. S/p DCCV on 09/16/17.  No symptomatic recurrence.  He has a Chadvasc score of 4. Continue anticoagulation with Eliquis 5 mg twice a day.   5. 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 Last A1c 6.2%.     I will follow up in 6 months.

## 2019-08-23 ENCOUNTER — Ambulatory Visit (INDEPENDENT_AMBULATORY_CARE_PROVIDER_SITE_OTHER): Payer: Medicare Other | Admitting: Cardiology

## 2019-08-23 ENCOUNTER — Encounter: Payer: Self-pay | Admitting: Cardiology

## 2019-08-23 ENCOUNTER — Other Ambulatory Visit: Payer: Self-pay

## 2019-08-23 VITALS — BP 120/70 | HR 57 | Ht 68.0 in | Wt 194.4 lb

## 2019-08-23 DIAGNOSIS — I25118 Atherosclerotic heart disease of native coronary artery with other forms of angina pectoris: Secondary | ICD-10-CM

## 2019-08-23 DIAGNOSIS — I480 Paroxysmal atrial fibrillation: Secondary | ICD-10-CM

## 2019-08-23 DIAGNOSIS — E7800 Pure hypercholesterolemia, unspecified: Secondary | ICD-10-CM

## 2019-08-23 DIAGNOSIS — I2510 Atherosclerotic heart disease of native coronary artery without angina pectoris: Secondary | ICD-10-CM

## 2019-08-30 ENCOUNTER — Encounter: Payer: Self-pay | Admitting: General Practice

## 2019-08-31 ENCOUNTER — Other Ambulatory Visit: Payer: Self-pay

## 2019-08-31 ENCOUNTER — Encounter: Payer: Self-pay | Admitting: Family Medicine

## 2019-08-31 ENCOUNTER — Ambulatory Visit (INDEPENDENT_AMBULATORY_CARE_PROVIDER_SITE_OTHER): Payer: Medicare Other | Admitting: Family Medicine

## 2019-08-31 VITALS — BP 124/82 | Temp 98.0°F | Resp 16 | Ht 68.0 in | Wt 191.2 lb

## 2019-08-31 DIAGNOSIS — Z0000 Encounter for general adult medical examination without abnormal findings: Secondary | ICD-10-CM

## 2019-08-31 DIAGNOSIS — I10 Essential (primary) hypertension: Secondary | ICD-10-CM

## 2019-08-31 DIAGNOSIS — E1159 Type 2 diabetes mellitus with other circulatory complications: Secondary | ICD-10-CM | POA: Diagnosis not present

## 2019-08-31 DIAGNOSIS — E782 Mixed hyperlipidemia: Secondary | ICD-10-CM

## 2019-08-31 DIAGNOSIS — I2510 Atherosclerotic heart disease of native coronary artery without angina pectoris: Secondary | ICD-10-CM

## 2019-08-31 DIAGNOSIS — I480 Paroxysmal atrial fibrillation: Secondary | ICD-10-CM

## 2019-08-31 DIAGNOSIS — E663 Overweight: Secondary | ICD-10-CM

## 2019-08-31 LAB — BASIC METABOLIC PANEL
BUN: 15 mg/dL (ref 6–23)
CO2: 28 mEq/L (ref 19–32)
Calcium: 9.5 mg/dL (ref 8.4–10.5)
Chloride: 105 mEq/L (ref 96–112)
Creatinine, Ser: 0.81 mg/dL (ref 0.40–1.50)
GFR: 92 mL/min (ref >60.00)
Glucose, Bld: 102 mg/dL — ABNORMAL HIGH (ref 70–99)
Potassium: 4.1 mEq/L (ref 3.5–5.1)
Sodium: 139 mEq/L (ref 135–145)

## 2019-08-31 LAB — CBC WITH DIFFERENTIAL/PLATELET
Basophils Absolute: 0.1 10*3/uL (ref 0.0–0.1)
Basophils Relative: 0.8 % (ref 0.0–3.0)
Eosinophils Absolute: 0.2 10*3/uL (ref 0.0–0.7)
Eosinophils Relative: 2.1 % (ref 0.0–5.0)
HCT: 44.4 % (ref 39.0–52.0)
Hemoglobin: 14.8 g/dL (ref 13.0–17.0)
Lymphocytes Relative: 27.7 % (ref 12.0–46.0)
Lymphs Abs: 2.4 10*3/uL (ref 0.7–4.0)
MCHC: 33.3 g/dL (ref 30.0–36.0)
MCV: 88.5 fl (ref 78.0–100.0)
Monocytes Absolute: 0.6 10*3/uL (ref 0.1–1.0)
Monocytes Relative: 7.5 % (ref 3.0–12.0)
Neutro Abs: 5.3 10*3/uL (ref 1.4–7.7)
Neutrophils Relative %: 61.9 % (ref 43.0–77.0)
Platelets: 201 10*3/uL (ref 150.0–400.0)
RBC: 5.02 Mil/uL (ref 4.22–5.81)
RDW: 14.4 % (ref 11.5–15.5)
WBC: 8.6 10*3/uL (ref 4.0–10.5)

## 2019-08-31 LAB — LIPID PANEL
Cholesterol: 123 mg/dL (ref 0–200)
HDL: 44 mg/dL (ref >39.00)
LDL Cholesterol: 45 mg/dL (ref 0–99)
NonHDL: 78.83
Total CHOL/HDL Ratio: 3
Triglycerides: 168 mg/dL — ABNORMAL HIGH (ref 0.0–149.0)
VLDL: 33.6 mg/dL (ref 0.0–40.0)

## 2019-08-31 LAB — HEPATIC FUNCTION PANEL
ALT: 20 U/L (ref 0–53)
AST: 21 U/L (ref 0–37)
Albumin: 4.4 g/dL (ref 3.5–5.2)
Alkaline Phosphatase: 66 U/L (ref 39–117)
Bilirubin, Direct: 0.1 mg/dL (ref 0.0–0.3)
Total Bilirubin: 0.8 mg/dL (ref 0.2–1.2)
Total Protein: 6.8 g/dL (ref 6.0–8.3)

## 2019-08-31 LAB — HEMOGLOBIN A1C: Hgb A1c MFr Bld: 6.3 % (ref 4.6–6.5)

## 2019-08-31 LAB — TSH: TSH: 2.44 u[IU]/mL (ref 0.35–4.50)

## 2019-08-31 NOTE — Assessment & Plan Note (Signed)
UTD on colonoscopy, immunizations.  Discussed need for healthy diet and low carb diet.

## 2019-08-31 NOTE — Patient Instructions (Addendum)
Follow up in 6 months to recheck sugars We'll notify you of your lab results and make any changes if needed Continue to work on healthy diet and regular exercise- you can do it! Call with any questions or concerns Stay Safe!  Stay Healthy! Happy New Year!!!   Preventive Care 7 Years and Older, Male Preventive care refers to lifestyle choices and visits with your health care provider that can promote health and wellness. This includes:  A yearly physical exam. This is also called an annual well check.  Regular dental and eye exams.  Immunizations.  Screening for certain conditions.  Healthy lifestyle choices, such as diet and exercise. What can I expect for my preventive care visit? Physical exam Your health care provider will check:  Height and weight. These may be used to calculate body mass index (BMI), which is a measurement that tells if you are at a healthy weight.  Heart rate and blood pressure.  Your skin for abnormal spots. Counseling Your health care provider may ask you questions about:  Alcohol, tobacco, and drug use.  Emotional well-being.  Home and relationship well-being.  Sexual activity.  Eating habits.  History of falls.  Memory and ability to understand (cognition).  Work and work Statistician. What immunizations do I need?  Influenza (flu) vaccine  This is recommended every year. Tetanus, diphtheria, and pertussis (Tdap) vaccine  You may need a Td booster every 10 years. Varicella (chickenpox) vaccine  You may need this vaccine if you have not already been vaccinated. Zoster (shingles) vaccine  You may need this after age 68. Pneumococcal conjugate (PCV13) vaccine  One dose is recommended after age 45. Pneumococcal polysaccharide (PPSV23) vaccine  One dose is recommended after age 39. Measles, mumps, and rubella (MMR) vaccine  You may need at least one dose of MMR if you were born in 1957 or later. You may also need a second  dose. Meningococcal conjugate (MenACWY) vaccine  You may need this if you have certain conditions. Hepatitis A vaccine  You may need this if you have certain conditions or if you travel or work in places where you may be exposed to hepatitis A. Hepatitis B vaccine  You may need this if you have certain conditions or if you travel or work in places where you may be exposed to hepatitis B. Haemophilus influenzae type b (Hib) vaccine  You may need this if you have certain conditions. You may receive vaccines as individual doses or as more than one vaccine together in one shot (combination vaccines). Talk with your health care provider about the risks and benefits of combination vaccines. What tests do I need? Blood tests  Lipid and cholesterol levels. These may be checked every 5 years, or more frequently depending on your overall health.  Hepatitis C test.  Hepatitis B test. Screening  Lung cancer screening. You may have this screening every year starting at age 47 if you have a 30-pack-year history of smoking and currently smoke or have quit within the past 15 years.  Colorectal cancer screening. All adults should have this screening starting at age 33 and continuing until age 33. Your health care provider may recommend screening at age 41 if you are at increased risk. You will have tests every 1-10 years, depending on your results and the type of screening test.  Prostate cancer screening. Recommendations will vary depending on your family history and other risks.  Diabetes screening. This is done by checking your blood sugar (glucose) after  you have not eaten for a while (fasting). You may have this done every 1-3 years.  Abdominal aortic aneurysm (AAA) screening. You may need this if you are a current or former smoker.  Sexually transmitted disease (STD) testing. Follow these instructions at home: Eating and drinking  Eat a diet that includes fresh fruits and vegetables, whole  grains, lean protein, and low-fat dairy products. Limit your intake of foods with high amounts of sugar, saturated fats, and salt.  Take vitamin and mineral supplements as recommended by your health care provider.  Do not drink alcohol if your health care provider tells you not to drink.  If you drink alcohol: ? Limit how much you have to 0-2 drinks a day. ? Be aware of how much alcohol is in your drink. In the [place] one drink equals one 12 oz bottle of beer (355 mL), one 5 oz glass of wine (148 mL), or one 1 oz glass of hard liquor (44 mL). Lifestyle  Take daily care of your teeth and gums.  Stay active. Exercise for at least 30 minutes on 5 or more days each week.  Do not use any products that contain nicotine or tobacco, such as cigarettes, e-cigarettes, and chewing tobacco. If you need help quitting, ask your health care provider.  If you are sexually active, practice safe sex. Use a condom or other form of protection to prevent STIs (sexually transmitted infections).  Talk with your health care provider about taking a low-dose aspirin or statin. What's next?  Visit your health care provider once a year for a well check visit.  Ask your health care provider how often you should have your eyes and teeth checked.  Stay up to date on all vaccines. This information is not intended to replace advice given to you by your health care provider. Make sure you discuss any questions you have with your health care provider. Document Released: 09/15/2015 Document Revised: 08/13/2018 Document Reviewed: 08/13/2018 Elsevier Patient Education  2020 Elsevier Inc.  

## 2019-08-31 NOTE — Assessment & Plan Note (Signed)
Chronic problem.  Tolerating statin w/o difficulty.  Check labs.  Adjust meds prn  

## 2019-08-31 NOTE — Assessment & Plan Note (Signed)
Chronic problem.  Well controlled.  Asymptomatic.  Check labs.  No anticipated med changes.  Will follow. 

## 2019-08-31 NOTE — Progress Notes (Signed)
Subjective:    Patient ID: Jacob Curry, male    DOB: 09-04-1940, 78 y.o.   MRN: [id number]  HPI Here today for [place].  Risk Factors: HTN- chronic problem, well controlled on Amlodipine 2.5mg , Coreg 12.5mg  1.5 tabs BID, Losartan 100mg  daily.  No CP, SOB, HAs, visual changes, edema. Hyperlipidemia- chronic problem, on Simvastatin 40mg  daily.  No abd pain, N/V. DM- chronic problem, on Metformin 500mg  BID.  On ARB for renal protection.  UTD on eye exam.  Due for foot exam.  Denies symptomatic lows.  No numbness/tingling of hands/feet.  No sores on feet Overweight- pt's BMI is 29.08.   Afib- chronic problem, following w/ Dr Martinique.  On Eliquis daily and rate controlled w/ Coreg. Physical Activity: no regular exercise b/c he stopped cardiac rehab due to COVID Fall Risk: low Depression: denies current sxs Hearing: normal to conversational tones, mildly decreased to whispered voice ADL's: independent Cognitive: normal linear thought process, memory and attention intact Home Safety: safe at home, lives w/ wife Height, Weight, BMI, Visual Acuity: see vitals, vision corrected to 20/20 w/ glasses Counseling: UTD on colonoscopy, immunizations, sees urology Labs Ordered: See A&P Care Plan: See A&P   Patient Care Team    Relationship Specialty Notifications Start End  Midge Minium, MD PCP - General Family Medicine  02/02/16   Martinique, Peter M, MD Consulting Physician Cardiology  02/02/16   Myrlene Broker, MD Attending Physician Urology  02/02/16   Druscilla Brownie, MD Consulting Physician Dermatology  02/02/16   Kristeen Miss, MD Consulting Physician Neurosurgery  02/16/18   Deneise Lever, MD Consulting Physician Pulmonary Disease  02/16/18   Oneal Grout, DDS Referring Physician Orthodontics  02/16/18     Health Maintenance  Topic Date Due  . FOOT EXAM  02/17/2019  . TETANUS/TDAP  06/01/2020 (Originally 01/03/1960)  . COLONOSCOPY  09/15/2019  . HEMOGLOBIN A1C  11/30/2019  .  OPHTHALMOLOGY EXAM  08/18/2020  . INFLUENZA VACCINE  Completed  . PNA vac Low Risk Adult  Completed      Review of Systems For ROS see HPI   This visit occurred during the SARS-CoV-2 public health emergency.  Safety protocols were in place, including screening questions prior to the visit, additional usage of staff PPE, and extensive cleaning of exam room while observing appropriate contact time as indicated for disinfecting solutions.       Objective:   Physical Exam Vitals reviewed.  Constitutional:      General: He is not in acute distress.    Appearance: He is well-developed. He is obese.  HENT:     Head: Normocephalic and atraumatic.  Eyes:     Conjunctiva/sclera: Conjunctivae normal.     Pupils: Pupils are equal, round, and reactive to light.  Neck:     Thyroid: No thyromegaly.  Cardiovascular:     Rate and Rhythm: Normal rate and regular rhythm.     Heart sounds: Normal heart sounds. No murmur.  Pulmonary:     Effort: Pulmonary effort is normal. No respiratory distress.     Breath sounds: Normal breath sounds.  Abdominal:     General: Bowel sounds are normal. There is no distension.     Palpations: Abdomen is soft.  Musculoskeletal:     Cervical back: Normal range of motion and neck supple.  Lymphadenopathy:     Cervical: No cervical adenopathy.  Skin:    General: Skin is warm and dry.  Neurological:     Mental Status:  He is alert and oriented to person, place, and time.     Cranial Nerves: No cranial nerve deficit.  Psychiatric:        Behavior: Behavior normal.           Assessment & Plan:

## 2019-08-31 NOTE — Assessment & Plan Note (Signed)
Ongoing issue.  Discussed need for healthy diet and regular exercise.  Will follow.

## 2019-08-31 NOTE — Assessment & Plan Note (Signed)
Chronic problem.  UTD on eye exam, on ARB.  Foot exam done today.  Stressed need for healthy diet and regular exercise.  Check labs.  Adjust meds prn

## 2019-08-31 NOTE — Assessment & Plan Note (Signed)
Chronic problem.  Following w/ Dr Martinique.  Currently asymptomatic.  Will follow along.

## 2019-09-01 ENCOUNTER — Encounter: Payer: Self-pay | Admitting: General Practice

## 2019-09-23 ENCOUNTER — Ambulatory Visit: Payer: Medicare Other | Admitting: Family Medicine

## 2019-09-23 ENCOUNTER — Ambulatory Visit: Payer: Medicare Other | Attending: Internal Medicine

## 2019-09-23 DIAGNOSIS — Z23 Encounter for immunization: Secondary | ICD-10-CM | POA: Insufficient documentation

## 2019-09-23 NOTE — Progress Notes (Signed)
   Covid-19 Vaccination Clinic  Name:  KWINTON SCHOEPP    MRN: [id number] DOB: [birth year]  09/23/2019  Mr. Willever was observed post Covid-19 immunization for 30 minutes based on pre-vaccination screening without incidence. He was provided with Vaccine Information Sheet and instruction to access the V-Safe system.   Mr. Spittle was instructed to call 911 with any severe reactions post vaccine: Marland Kitchen Difficulty breathing  . Swelling of your face and throat  . A fast heartbeat  . A bad rash all over your body  . Dizziness and weakness    Immunizations Administered    Name Date Dose VIS Date Route   Pfizer COVID-19 Vaccine 09/23/2019  9:03 AM 0.3 mL 08/13/2019 Intramuscular   Manufacturer: [place]   Lot: [id number]   [place]: [id number]

## 2019-10-14 ENCOUNTER — Ambulatory Visit: Payer: Medicare Other | Attending: Internal Medicine

## 2019-10-14 DIAGNOSIS — Z23 Encounter for immunization: Secondary | ICD-10-CM | POA: Insufficient documentation

## 2019-10-14 NOTE — Progress Notes (Addendum)
   Covid-19 Vaccination Clinic  Name:  Jacob Curry    MRN: [id number] DOB: 09-20-1940  10/14/2019  Mr. Jacob Curry was observed post Covid-19 immunization for 15 minutes without incidence. He was provided with Vaccine Information Sheet and instruction to access the V-Safe system.   Mr. Jacob Curry was instructed to call 911 with any severe reactions post vaccine: Marland Kitchen Difficulty breathing  . Swelling of your face and throat  . A fast heartbeat  . A bad rash all over your body  . Dizziness and weakness    Immunizations Administered    Name Date Dose VIS Date Route   Pfizer COVID-19 Vaccine 10/14/2019  9:32 AM 0.3 mL 08/13/2019 Intramuscular   Manufacturer: [place]   Lot: [id number]   [place]: [id number]

## 2019-10-26 ENCOUNTER — Other Ambulatory Visit: Payer: Self-pay | Admitting: Cardiology

## 2019-11-26 ENCOUNTER — Other Ambulatory Visit: Payer: Self-pay | Admitting: Cardiology

## 2020-01-27 ENCOUNTER — Other Ambulatory Visit: Payer: Self-pay | Admitting: Cardiology

## 2020-02-14 ENCOUNTER — Other Ambulatory Visit: Payer: Self-pay | Admitting: Cardiology

## 2020-02-14 NOTE — Telephone Encounter (Signed)
New Message   *STAT* If patient is at the pharmacy, call can be transferred to refill team.   1. Which medications need to be refilled? (please list name of each medication and dose if known) carvedilol (COREG) 12.5 MG tablet  2. Which pharmacy/location (including street and city if local pharmacy) is medication to be sent to? [place], [place]  3. Do they need a 30 day or 90 day supply? 90 day supply

## 2020-02-15 MED ORDER — CARVEDILOL 12.5 MG PO TABS: ORAL_TABLET | ORAL | 3 refills | Status: AC

## 2020-02-22 ENCOUNTER — Other Ambulatory Visit: Payer: Self-pay | Admitting: Family Medicine

## 2020-02-25 NOTE — Progress Notes (Signed)
[name] Date of Birth: 1941-01-04 Medical Record #[id number]  History of Present Illness: Jacob Curry is seen for follow up CAD. He has a known history of coronary disease with cardiac catheterization in November 2007 showing total occlusion of a large left circumflex vessel with good left to left collaterals. There was 40% disease in the left main and origin of LAD. 90% mid diagonal, 80-90% mid RCA and 90% RV marginal branch.  Ejection fraction was 50-55%. Prior to this he had a nuclear stress test- he was able to exercise 8:30 with severe ischemia of the inferolateral wall. He has been managed medically. Repeat cardiac cath in May 2011 and May 2016 showed no significant change. He has a history of hypertension, hyperlipidemia, Pafib,  diabetes. He has a history of moderate obstructive sleep apnea. This is managed with an oral appliance. He was unable to tolerate CPAP therapy.  He is anticoagulated with Eliquis.  In December 2018 he developed Afib. Was evaluated in Afib clinic and underwent successful DCCV on 09/16/17. He thinks this was related to getting a steroid shot for bronchitis.   On follow up today he is doing well. He does note that his Apple phone has indicated Afib going back intermmittently since last September. More so in the last 1-2 months. rate is controlled and he is completely asymptomatic. Denies any  any chest pain, dyspnea, palpitations, edema.  His weight has gone up. He is able to do his yard work including chain saw and splitting wood.  Current Outpatient Medications on File Prior to Visit  Medication Sig Dispense Refill  . amLODipine (NORVASC) 2.5 MG tablet Take 1 tablet (2.5 mg total) by mouth daily. 90 tablet 2  . carvedilol (COREG) 12.5 MG tablet TAKE 1 AND 1/2 TABLETS TWICE DAILY 180 tablet 3  . ELIQUIS 5 MG TABS tablet TAKE 1 TABLET BY MOUTH TWICE A DAY 60 tablet 5  . EPINEPHrine 0.3 mg/0.3 mL IJ SOAJ injection Inject 0.3 mLs (0.3 mg total) into the muscle once. 1  Device 3  . fish oil-omega-3 fatty acids 1000 MG capsule Take 2 g by mouth daily.      Marland Kitchen losartan (COZAAR) 100 MG tablet TAKE 1 TABLET EVERY DAY 90 tablet 2  . metFORMIN (GLUCOPHAGE) 500 MG tablet TAKE 1 TABLET BY MOUTH 2 (TWO) TIMES DAILY WITH A MEAL. 180 tablet 2  . Multiple Vitamin (MULTIVITAMIN) tablet Take 1 tablet by mouth daily.    . nitroGLYCERIN (NITROSTAT) 0.4 MG SL tablet Place 1 tablet (0.4 mg total) under the tongue every 5 (five) minutes as needed. 25 tablet 11  . Saw Palmetto, Serenoa repens, (SAW PALMETTO PO) Take 1 tablet by mouth as needed (prostate).     . simvastatin (ZOCOR) 40 MG tablet TAKE 1 TABLET AT BEDTIME 90 tablet 2  . VITAMIN D PO Take 1 tablet by mouth daily.     No current facility-administered medications on file prior to visit.    Allergies  Allergen Reactions  . Acetaminophen Other (See Comments)    Drug induced hepatitis  . Oxycodone-Acetaminophen Other (See Comments)    Drug induced hepatitis  . Flomax [Tamsulosin Hcl] Other (See Comments)    incontinence    Past Medical History:  Diagnosis Date  . CAD (coronary artery disease)    Dr Kaelee Pfeffer [place]  . CAD, NATIVE VESSEL 10/20/2008       . Carotid stenosis   . Complication of anesthesia    " DIFFICULTY WAKING "  . DM (  diabetes mellitus) ([place])   . ED (erectile dysfunction)   . ERECTILE DYSFUNCTION 07/23/2007   Qualifier: Diagnosis of  By: Linna Darner MD, Gwyndolyn Saxon    . HLD (hyperlipidemia)   . HTN (hypertension)   . Hyperlipidemia 10/20/2008   Qualifier: Diagnosis of  By: Haroldine Laws, MD, Eileen Stanford Myocardial infarction ([place]) 2007  . Obesity   . Obstructive sleep apnea 07/19/2010   NPSG 07/20/10- AHI 20.2/ hr   . OSA (obstructive sleep apnea)    oral appliance, Dr Ron Parker  . Paroxysmal atrial fibrillation (HCC)   . PREMATURE VENTRICULAR CONTRACTIONS, FREQUENT 01/31/2010   Qualifier: Diagnosis of  By: Haroldine Laws, MD, Eileen Stanford 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[facility])     Mother had DM   . Typical atrial flutter ([place])   . VENTRAL HERNIA 01/08/2008   Qualifier: Diagnosis of  By: Linna Darner MD, Gwyndolyn Saxon      Past Surgical History:  Procedure Laterality Date  . [place]  . APPENDECTOMY  1958  . CARDIAC CATHETERIZATION  2007 & 2011  . CARDIAC CATHETERIZATION N/A 01/13/2015   Procedure: Left Heart Cath and Coronary Angiography;  Surgeon: Laquan Ludden M [place], MD;  Location: [facility];  Service: Cardiovascular;  Laterality: N/A;  . CARDIOVERSION N/A 09/16/2017   Procedure: CARDIOVERSION;  Surgeon: Dorothy Spark, MD;  Location: [facility];  Service: Cardiovascular;  Laterality: N/A;  . COLONOSCOPY  2002   negative; Dr Olevia Perches  . [place]  . TONSILLECTOMY      Social History   Tobacco Use  Smoking Status Former Smoker  . Packs/day: 1.00  . Quit date: 09/02/1990  . Years since quitting: 29.5  Smokeless Tobacco Never Used  Tobacco Comment   onset age [age] up to 1 ppd    Social History   Substance and Sexual Activity  Alcohol Use No    Family History  Problem Relation Age of Onset  . Diabetes Mother   . Heart attack Father         4 vessel CBAG @ 20  . Colon cancer Paternal Uncle   . Diabetes Brother   . Stroke Neg Hx     Review of Systems: As noted in history of present illness.  All other systems were reviewed and are negative.  Physical Exam: BP 130/70 (BP Location: Left Arm, Patient Position: Sitting, Cuff Size: Normal)   Pulse 84   Ht 5\' 8"  (1.727 m)   Wt 198 lb (89.8 kg)   BMI 30.11 kg/m  GENERAL:  Well appearing obese WM in NAD HEENT:  PERRL, EOMI, sclera are clear. Oropharynx is clear. NECK:  No jugular venous distention, carotid upstroke brisk and symmetric, no bruits, no thyromegaly or adenopathy LUNGS:  Clear to auscultation bilaterally CHEST:  Unremarkable HEART:  RRR,  PMI not displaced or sustained,S1 and S2 within normal limits, no S3, no S4: no clicks, no rubs, no murmurs ABD:  Soft,  nontender. BS +, no masses or bruits. No hepatomegaly, no splenomegaly EXT:  2 + pulses throughout, no edema, no cyanosis no clubbing SKIN:  Warm and dry.  No rashes NEURO:  Alert and oriented x 3. Cranial nerves II through XII intact. PSYCH:  Cognitively intact   LABORATORY DATA: Lab Results  Component Value Date   WBC 8.6 08/31/2019   HGB 14.8 08/31/2019   HCT 44.4 08/31/2019   PLT 201.0 08/31/2019   GLUCOSE 102 (H) 08/31/2019  CHOL 123 08/31/2019   TRIG 168.0 (H) 08/31/2019   HDL 44.00 08/31/2019   LDLDIRECT 40.0 02/16/2019   LDLCALC 45 08/31/2019   ALT 20 08/31/2019   AST 21 08/31/2019   NA 139 08/31/2019   K 4.1 08/31/2019   CL 105 08/31/2019   CREATININE 0.81 08/31/2019   BUN 15 08/31/2019   CO2 28 08/31/2019   TSH 2.44 08/31/2019   PSA 1.70 01/04/2009   INR 1.75 (H) 01/12/2015   HGBA1C 6.3 08/31/2019   MICROALBUR 0.4 03/09/2012   Ecg is  done today. Afib with rate 84. occ PVC. Otherwise normal.. I have personally reviewed and interpreted this study.   Assessment / Plan: 1. 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 Moderate to severe 3 vessel disease. Patient remains asymptomatic on medical therapy.   Will continue medical therapy. Follow up in 6 months.  2. Hypertension,  well controlled.  Continue current therapy  3. Hyperlipidemia well controlled on medication.  LDL 45 on Zocor.  4. Atrial fibrillation, paroxysmal- perhaps persistent now. S/p DCCV on 09/16/17.  He is asymptomatic and rate is well controlled. He has a Chadvasc score of 4. Continue anticoagulation with Eliquis 5 mg twice a day.   5. 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 Last A1c 6.3%.  Due for follow up next week with Dr Birdie Riddle.   I will follow up in 6 months.

## 2020-02-28 ENCOUNTER — Ambulatory Visit (INDEPENDENT_AMBULATORY_CARE_PROVIDER_SITE_OTHER): Payer: Medicare Other | Admitting: Cardiology

## 2020-02-28 ENCOUNTER — Encounter: Payer: Self-pay | Admitting: Cardiology

## 2020-02-28 ENCOUNTER — Other Ambulatory Visit: Payer: Self-pay

## 2020-02-28 VITALS — BP 130/70 | HR 84 | Ht 68.0 in | Wt 198.0 lb

## 2020-02-28 DIAGNOSIS — I4819 Other persistent atrial fibrillation: Secondary | ICD-10-CM | POA: Diagnosis not present

## 2020-02-28 DIAGNOSIS — I10 Essential (primary) hypertension: Secondary | ICD-10-CM

## 2020-02-28 DIAGNOSIS — I25118 Atherosclerotic heart disease of native coronary artery with other forms of angina pectoris: Secondary | ICD-10-CM | POA: Diagnosis not present

## 2020-02-28 DIAGNOSIS — E7800 Pure hypercholesterolemia, unspecified: Secondary | ICD-10-CM

## 2020-02-28 MED ORDER — NITROGLYCERIN 0.4 MG SL SUBL: 0.4000 mg | SUBLINGUAL_TABLET | SUBLINGUAL | 11 refills | Status: AC | PRN

## 2020-03-01 ENCOUNTER — Other Ambulatory Visit: Payer: Self-pay

## 2020-03-01 ENCOUNTER — Encounter: Payer: Self-pay | Admitting: Family Medicine

## 2020-03-01 ENCOUNTER — Ambulatory Visit (INDEPENDENT_AMBULATORY_CARE_PROVIDER_SITE_OTHER): Payer: Medicare Other | Admitting: Family Medicine

## 2020-03-01 ENCOUNTER — Telehealth: Payer: Medicare Other | Admitting: Cardiology

## 2020-03-01 ENCOUNTER — Ambulatory Visit: Payer: Medicare Other | Admitting: Cardiology

## 2020-03-01 VITALS — BP 120/83 | HR 81 | Temp 97.6°F | Resp 16 | Ht 68.0 in | Wt 197.0 lb

## 2020-03-01 DIAGNOSIS — D369 Benign neoplasm, unspecified site: Secondary | ICD-10-CM

## 2020-03-01 DIAGNOSIS — E782 Mixed hyperlipidemia: Secondary | ICD-10-CM | POA: Diagnosis not present

## 2020-03-01 DIAGNOSIS — E1159 Type 2 diabetes mellitus with other circulatory complications: Secondary | ICD-10-CM | POA: Diagnosis not present

## 2020-03-01 DIAGNOSIS — I10 Essential (primary) hypertension: Secondary | ICD-10-CM

## 2020-03-01 DIAGNOSIS — I25118 Atherosclerotic heart disease of native coronary artery with other forms of angina pectoris: Secondary | ICD-10-CM

## 2020-03-01 LAB — LIPID PANEL
Cholesterol: 99 mg/dL (ref 0–200)
HDL: 43.2 mg/dL (ref >39.00)
LDL Cholesterol: 27 mg/dL (ref 0–99)
NonHDL: 55.32
Total CHOL/HDL Ratio: 2
Triglycerides: 141 mg/dL (ref 0.0–149.0)
VLDL: 28.2 mg/dL (ref 0.0–40.0)

## 2020-03-01 LAB — CBC WITH DIFFERENTIAL/PLATELET
Basophils Absolute: 0.1 10*3/uL (ref 0.0–0.1)
Basophils Relative: 1.1 % (ref 0.0–3.0)
Eosinophils Absolute: 0.1 10*3/uL (ref 0.0–0.7)
Eosinophils Relative: 1.5 % (ref 0.0–5.0)
HCT: 44.8 % (ref 39.0–52.0)
Hemoglobin: 15.1 g/dL (ref 13.0–17.0)
Lymphocytes Relative: 27.6 % (ref 12.0–46.0)
Lymphs Abs: 2.4 10*3/uL (ref 0.7–4.0)
MCHC: 33.6 g/dL (ref 30.0–36.0)
MCV: 90.5 fl (ref 78.0–100.0)
Monocytes Absolute: 0.9 10*3/uL (ref 0.1–1.0)
Monocytes Relative: 10 % (ref 3.0–12.0)
Neutro Abs: 5.2 10*3/uL (ref 1.4–7.7)
Neutrophils Relative %: 59.8 % (ref 43.0–77.0)
Platelets: 183 10*3/uL (ref 150.0–400.0)
RBC: 4.95 Mil/uL (ref 4.22–5.81)
RDW: 13.9 % (ref 11.5–15.5)
WBC: 8.7 10*3/uL (ref 4.0–10.5)

## 2020-03-01 LAB — HEPATIC FUNCTION PANEL
ALT: 18 U/L (ref 0–53)
AST: 17 U/L (ref 0–37)
Albumin: 4.4 g/dL (ref 3.5–5.2)
Alkaline Phosphatase: 69 U/L (ref 39–117)
Bilirubin, Direct: 0.2 mg/dL (ref 0.0–0.3)
Total Bilirubin: 1 mg/dL (ref 0.2–1.2)
Total Protein: 6.7 g/dL (ref 6.0–8.3)

## 2020-03-01 LAB — BASIC METABOLIC PANEL
BUN: 16 mg/dL (ref 6–23)
CO2: 28 mEq/L (ref 19–32)
Calcium: 9.2 mg/dL (ref 8.4–10.5)
Chloride: 104 mEq/L (ref 96–112)
Creatinine, Ser: 0.96 mg/dL (ref 0.40–1.50)
GFR: 75.53 mL/min (ref >60.00)
Glucose, Bld: 115 mg/dL — ABNORMAL HIGH (ref 70–99)
Potassium: 4.6 mEq/L (ref 3.5–5.1)
Sodium: 140 mEq/L (ref 135–145)

## 2020-03-01 LAB — HEMOGLOBIN A1C: Hgb A1c MFr Bld: 6.6 % — ABNORMAL HIGH (ref 4.6–6.5)

## 2020-03-01 LAB — TSH: TSH: 2.74 u[IU]/mL (ref 0.35–4.50)

## 2020-03-01 MED ORDER — METFORMIN HCL 500 MG PO TABS: ORAL_TABLET | ORAL | 2 refills | Status: DC

## 2020-03-01 NOTE — Assessment & Plan Note (Signed)
Chronic problem.  Well controlled on current meds.  Asymptomatic.  Check labs.  No anticipated med changes.  Will follow.

## 2020-03-01 NOTE — Assessment & Plan Note (Signed)
Chronic problem.  Tolerating statin w/o difficulty.  Check labs.  Adjust meds prn  

## 2020-03-01 NOTE — Progress Notes (Signed)
   Subjective:    Patient ID: Jacob Curry, male    DOB: [birth year], 79 y.o.   MRN: [id number]  HPI HTN- chronic problem, on Amlodipine 2.5mg  daily, Coreg 12.5mg  1.5 tabs BID, Losartan 100mg  daily w/ good control.  No CP, SOB, HAs, visual changes, edema.  Hyperlipidemia- chronic problem, on Simvastatin 40mg  daily.  Pt has gained 6 lbs since last visit.  No abd pain, N/V  DM- chronic problem, on Metformin 500mg  BID w/ hx of good control.  Last A1C 6.3.  UTD on eye exam, foot exam.  On ARB for renal protection.  Denies symptomatic lows.  No numbness/tingling of hands/feet.   Review of Systems For ROS see HPI   This visit occurred during the SARS-CoV-2 public health emergency.  Safety protocols were in place, including screening questions prior to the visit, additional usage of staff PPE, and extensive cleaning of exam room while observing appropriate contact time as indicated for disinfecting solutions.       Objective:   Physical Exam Vitals reviewed.  Constitutional:      General: He is not in acute distress.    Appearance: Normal appearance. He is well-developed.  HENT:     Head: Normocephalic and atraumatic.  Eyes:     Conjunctiva/sclera: Conjunctivae normal.     Pupils: Pupils are equal, round, and reactive to light.  Neck:     Thyroid: No thyromegaly.  Cardiovascular:     Rate and Rhythm: Normal rate and regular rhythm.     Heart sounds: Normal heart sounds. No murmur heard.   Pulmonary:     Effort: Pulmonary effort is normal. No respiratory distress.     Breath sounds: Normal breath sounds.  Abdominal:     General: Bowel sounds are normal. There is no distension.     Palpations: Abdomen is soft.  Musculoskeletal:     Cervical back: Normal range of motion and neck supple.  Lymphadenopathy:     Cervical: No cervical adenopathy.  Skin:    General: Skin is warm and dry.  Neurological:     Mental Status: He is alert and oriented to person, place, and time.      Cranial Nerves: No cranial nerve deficit.  Psychiatric:        Behavior: Behavior normal.           Assessment & Plan:

## 2020-03-01 NOTE — Assessment & Plan Note (Signed)
Chronic problem.  Hx of good control on Metformin.  UTD on eye exam, foot exam.  On ARB for renal protection.  Encouraged low carb diet and regular exercise.  Check labs.  Adjust meds prn

## 2020-03-01 NOTE — Patient Instructions (Addendum)
Follow up in 6 months to recheck diabetes, cholesterol, and BP We'll notify you of your lab results and make any changes if needed Continue to work on healthy diet and regular exercise- you can do it! We'll call you with your GI appt to discuss colonoscopy Call with any questions or concerns Have a great summer!!!

## 2020-05-22 ENCOUNTER — Telehealth: Payer: Self-pay | Admitting: Family Medicine

## 2020-05-22 NOTE — Progress Notes (Signed)
°  Chronic Care Management   Note  05/22/2020 Name: Jacob Curry MRN: [id number] DOB: [birth year]  [name] is a 79 y.o. year old male who is a primary care patient of Birdie Riddle, Aundra Millet, MD. I reached out to [name] by phone today in response to a referral sent by [name]'s PCP, Midge Minium, MD.   [name] was given information about Chronic Care Management services today including:  1. CCM service includes personalized support from designated clinical staff supervised by his physician, including individualized plan of care and coordination with other care providers 2. 24/7 contact phone numbers for assistance for urgent and routine care needs. 3. Service will only be billed when office clinical staff spend 20 minutes or more in a month to coordinate care. 4. Only one practitioner may furnish and bill the service in a calendar month. 5. The patient may stop CCM services at any time (effective at the end of the month) by phone call to the office staff.   Patient did not agree to enrollment in care management services and does not wish to consider at this time.  Follow up plan:   Lauretta Grill [employer] Scheduler

## 2020-05-24 ENCOUNTER — Other Ambulatory Visit: Payer: Self-pay

## 2020-05-24 ENCOUNTER — Ambulatory Visit (INDEPENDENT_AMBULATORY_CARE_PROVIDER_SITE_OTHER): Payer: Medicare Other

## 2020-05-24 DIAGNOSIS — Z23 Encounter for immunization: Secondary | ICD-10-CM | POA: Diagnosis not present

## 2020-08-03 DIAGNOSIS — Z23 Encounter for immunization: Secondary | ICD-10-CM | POA: Diagnosis not present

## 2020-08-18 DIAGNOSIS — E119 Type 2 diabetes mellitus without complications: Secondary | ICD-10-CM | POA: Diagnosis not present

## 2020-08-18 DIAGNOSIS — Z961 Presence of intraocular lens: Secondary | ICD-10-CM | POA: Diagnosis not present

## 2020-08-18 DIAGNOSIS — Z7984 Long term (current) use of oral hypoglycemic drugs: Secondary | ICD-10-CM | POA: Diagnosis not present

## 2020-08-18 LAB — HM DIABETES EYE EXAM

## 2020-08-21 DIAGNOSIS — N529 Male erectile dysfunction, unspecified: Secondary | ICD-10-CM | POA: Diagnosis not present

## 2020-08-21 DIAGNOSIS — N138 Other obstructive and reflux uropathy: Secondary | ICD-10-CM | POA: Diagnosis not present

## 2020-08-21 DIAGNOSIS — N401 Enlarged prostate with lower urinary tract symptoms: Secondary | ICD-10-CM | POA: Diagnosis not present

## 2020-08-30 ENCOUNTER — Encounter: Payer: Self-pay | Admitting: Family Medicine

## 2020-08-30 ENCOUNTER — Other Ambulatory Visit: Payer: Self-pay

## 2020-08-30 ENCOUNTER — Ambulatory Visit (INDEPENDENT_AMBULATORY_CARE_PROVIDER_SITE_OTHER): Payer: Medicare Other | Admitting: Family Medicine

## 2020-08-30 VITALS — BP 126/70 | HR 80 | Temp 97.1°F | Resp 16 | Ht 68.0 in | Wt 202.0 lb

## 2020-08-30 DIAGNOSIS — I25118 Atherosclerotic heart disease of native coronary artery with other forms of angina pectoris: Secondary | ICD-10-CM

## 2020-08-30 DIAGNOSIS — E782 Mixed hyperlipidemia: Secondary | ICD-10-CM | POA: Diagnosis not present

## 2020-08-30 DIAGNOSIS — E669 Obesity, unspecified: Secondary | ICD-10-CM

## 2020-08-30 DIAGNOSIS — I10 Essential (primary) hypertension: Secondary | ICD-10-CM | POA: Diagnosis not present

## 2020-08-30 DIAGNOSIS — E1159 Type 2 diabetes mellitus with other circulatory complications: Secondary | ICD-10-CM

## 2020-08-30 LAB — LIPID PANEL
Cholesterol: 104 mg/dL (ref 0–200)
HDL: 44.6 mg/dL (ref >39.00)
LDL Cholesterol: 30 mg/dL (ref 0–99)
NonHDL: 59.34
Total CHOL/HDL Ratio: 2
Triglycerides: 147 mg/dL (ref 0.0–149.0)
VLDL: 29.4 mg/dL (ref 0.0–40.0)

## 2020-08-30 LAB — BASIC METABOLIC PANEL
BUN: 21 mg/dL (ref 6–23)
CO2: 27 mEq/L (ref 19–32)
Calcium: 9 mg/dL (ref 8.4–10.5)
Chloride: 106 mEq/L (ref 96–112)
Creatinine, Ser: 0.97 mg/dL (ref 0.40–1.50)
GFR: 74.17 mL/min (ref >60.00)
Glucose, Bld: 150 mg/dL — ABNORMAL HIGH (ref 70–99)
Potassium: 4.2 mEq/L (ref 3.5–5.1)
Sodium: 141 mEq/L (ref 135–145)

## 2020-08-30 LAB — CBC WITH DIFFERENTIAL/PLATELET
Basophils Absolute: 0.1 10*3/uL (ref 0.0–0.1)
Basophils Relative: 1.4 % (ref 0.0–3.0)
Eosinophils Absolute: 0.2 10*3/uL (ref 0.0–0.7)
Eosinophils Relative: 2 % (ref 0.0–5.0)
HCT: 43.5 % (ref 39.0–52.0)
Hemoglobin: 14.6 g/dL (ref 13.0–17.0)
Lymphocytes Relative: 31.2 % (ref 12.0–46.0)
Lymphs Abs: 2.5 10*3/uL (ref 0.7–4.0)
MCHC: 33.5 g/dL (ref 30.0–36.0)
MCV: 88.8 fl (ref 78.0–100.0)
Monocytes Absolute: 0.9 10*3/uL (ref 0.1–1.0)
Monocytes Relative: 10.8 % (ref 3.0–12.0)
Neutro Abs: 4.3 10*3/uL (ref 1.4–7.7)
Neutrophils Relative %: 54.6 % (ref 43.0–77.0)
Platelets: 179 10*3/uL (ref 150.0–400.0)
RBC: 4.89 Mil/uL (ref 4.22–5.81)
RDW: 14.6 % (ref 11.5–15.5)
WBC: 7.9 10*3/uL (ref 4.0–10.5)

## 2020-08-30 LAB — HEPATIC FUNCTION PANEL
ALT: 22 U/L (ref 0–53)
AST: 20 U/L (ref 0–37)
Albumin: 4.4 g/dL (ref 3.5–5.2)
Alkaline Phosphatase: 66 U/L (ref 39–117)
Bilirubin, Direct: 0.2 mg/dL (ref 0.0–0.3)
Total Bilirubin: 1 mg/dL (ref 0.2–1.2)
Total Protein: 6.6 g/dL (ref 6.0–8.3)

## 2020-08-30 LAB — HEMOGLOBIN A1C: Hgb A1c MFr Bld: 7.3 % — ABNORMAL HIGH (ref 4.6–6.5)

## 2020-08-30 LAB — TSH: TSH: 3.06 u[IU]/mL (ref 0.35–4.50)

## 2020-08-30 NOTE — Assessment & Plan Note (Signed)
Chronic problem, on Metformin 500mg  BID.  On ARB for renal protection.  UTD on eye exam.  Foot exam done today.  Stressed need for healthy diet and regular exercise.  Check labs.  Adjust meds prn

## 2020-08-30 NOTE — Assessment & Plan Note (Signed)
Chronic problem, on Simvastatin 40mg  daily.  Stressed need for healthy diet and regular exercise.  Check labs.  Adjust meds prn

## 2020-08-30 NOTE — Assessment & Plan Note (Signed)
Chronic problem.  Well controlled today on Amlodipine, Coreg, Losartan.  Check labs.  No anticipated med changes at this time

## 2020-08-30 NOTE — Progress Notes (Signed)
   Subjective:    Patient ID: CAISON HEARN, male    DOB: Dec 24, 1940, 79 y.o.   MRN: [id number]  HPI HTN- chronic problem, on Amlodipine 2.5mg  daily, Coreg 18.75mg  BID, Losartan 100mg  daily w/ good control.  No CP, SOB, HAs, visual changes, edema.  Hyperlipidemia- chronic problem, on Simvastatin 40mg  daily.  No abd pain, N/V  DM- on Metformin 500mg  BID.  On ARB for renal protection.  Due for foot exam.  Had eye exam last week w/ Dr .  Denies symptomatic lows.  No numbness/tingling of hands/feet.  Obesity- pt has gained 5 lbs.  BMI is now 30.71  Pt admits to holiday eating, 'i'll do better.  I promise'.  Pt joined new .  No exercise since stopping cardiac rehab.   Review of Systems For ROS see HPI   This visit occurred during the SARS-CoV-2 public health emergency.  Safety protocols were in place, including screening questions prior to the visit, additional usage of staff PPE, and extensive cleaning of exam room while observing appropriate contact time as indicated for disinfecting solutions.       Objective:   Physical Exam Vitals reviewed.  Constitutional:      General: He is not in acute distress.    Appearance: Normal appearance. He is well-developed and well-nourished. He is obese.  HENT:     Head: Normocephalic and atraumatic.  Eyes:     Extraocular Movements: EOM normal.     Conjunctiva/sclera: Conjunctivae normal.     Pupils: Pupils are equal, round, and reactive to light.  Neck:     Thyroid: No thyromegaly.  Cardiovascular:     Rate and Rhythm: Normal rate. Rhythm irregular.     Pulses: Normal pulses and intact distal pulses.     Heart sounds: Normal heart sounds. No murmur heard.   Pulmonary:     Effort: Pulmonary effort is normal. No respiratory distress.     Breath sounds: Normal breath sounds.  Abdominal:     General: Bowel sounds are normal. There is no distension.     Palpations: Abdomen is soft.  Musculoskeletal:        General: No  edema.     Cervical back: Normal range of motion and neck supple.     Right lower leg: No edema.     Left lower leg: No edema.  Lymphadenopathy:     Cervical: No cervical adenopathy.  Skin:    General: Skin is warm and dry.  Neurological:     Mental Status: He is alert and oriented to person, place, and time.     Cranial Nerves: No cranial nerve deficit.  Psychiatric:        Mood and Affect: Mood and affect normal.        Behavior: Behavior normal.           Assessment & Plan:

## 2020-08-30 NOTE — Patient Instructions (Signed)
Follow up in 6 months to recheck diabetes, BP, and cholesterol We'll notify you of your lab results and make any changes if needed Continue to work on healthy diet and regular exercise- you can do it! Call with any questions or concerns Stay Safe!  Stay Healthy! Happy New Year!!!

## 2020-08-30 NOTE — Assessment & Plan Note (Signed)
Pt has gained 5 lbs since last visit.  BMI is now 30.71.  Stressed need for healthy diet and regular exercise.  Pt joined new gym- applauded this decision.  Will follow.

## 2020-09-07 NOTE — Progress Notes (Signed)
Subjective:   Jacob Curry is a 80 y.o. male who presents for Medicare Annual/Subsequent preventive examination.  Review of Systems     Cardiac Risk Factors include: advanced age (>7555men, 42>65 women);diabetes mellitus;dyslipidemia;hypertension;obesity (BMI >30kg/m2);sedentary lifestyle     Objective:    Today's Vitals   09/11/20 0814  BP: 138/76  Pulse: 88  Resp: 16  Temp: 97.8 F (36.6 C)  TempSrc: Oral  SpO2: 96%  Weight: 202 lb 6.4 oz (91.8 kg)  Height: 5\' 8"  (1.727 m)   Body mass index is 30.77 kg/m.  Advanced Directives 09/11/2020 02/16/2018 09/16/2017 01/19/2015 01/11/2015  Does Patient Have a Medical Advance Directive? No No No No No  Would patient like information on creating a medical advance directive? No - Patient declined Yes (MAU/Ambulatory/Procedural Areas - Information given) No - Patient declined - -    Current Medications (verified) Outpatient Encounter Medications as of 09/11/2020  Medication Sig  . amLODipine (NORVASC) 2.5 MG tablet Take 1 tablet (2.5 mg total) by mouth daily.  . carvedilol (COREG) 12.5 MG tablet TAKE 1 AND 1/2 TABLETS TWICE DAILY  . ELIQUIS 5 MG TABS tablet TAKE 1 TABLET BY MOUTH TWICE A DAY  . EPINEPHrine 0.3 mg/0.3 mL IJ SOAJ injection Inject 0.3 mLs (0.3 mg total) into the muscle once.  . fish oil-omega-3 fatty acids 1000 MG capsule Take 2 g by mouth daily.  Marland Kitchen. losartan (COZAAR) 100 MG tablet TAKE 1 TABLET EVERY DAY  . metFORMIN (GLUCOPHAGE) 500 MG tablet TAKE 1 TABLET BY MOUTH 2 (TWO) TIMES DAILY WITH A MEAL.  . Multiple Vitamin (MULTIVITAMIN) tablet Take 1 tablet by mouth daily.  . nitroGLYCERIN (NITROSTAT) 0.4 MG SL tablet Place 1 tablet (0.4 mg total) under the tongue every 5 (five) minutes as needed.  . Saw Palmetto, Serenoa repens, (SAW PALMETTO PO) Take 1 tablet by mouth as needed (prostate).  . simvastatin (ZOCOR) 40 MG tablet TAKE 1 TABLET AT BEDTIME  . VITAMIN D PO Take 1 tablet by mouth daily.   No facility-administered  encounter medications on file as of 09/11/2020.    Allergies (verified) Acetaminophen, Oxycodone-acetaminophen, Tamsulosin, and Flomax [tamsulosin hcl]   History: Past Medical History:  Diagnosis Date  . CAD (coronary artery disease)    Dr Peter SwazilandJordan  . CAD, NATIVE VESSEL 10/20/2008       . Carotid stenosis   . Complication of anesthesia    " DIFFICULTY WAKING "  . DM (diabetes mellitus) (HCC)   . ED (erectile dysfunction)   . ERECTILE DYSFUNCTION 07/23/2007   Qualifier: Diagnosis of  By: Alwyn RenHopper MD, Chrissie NoaWilliam    . HLD (hyperlipidemia)   . HTN (hypertension)   . Hyperlipidemia 10/20/2008   Qualifier: Diagnosis of  By: Gala RomneyBensimhon, MD, Trixie DredgeFACC, Daniel R   . Myocardial infarction (HCC) 2007  . Obesity   . Obstructive sleep apnea 07/19/2010   NPSG 07/20/10- AHI 20.2/ hr   . OSA (obstructive sleep apnea)    oral appliance, Dr Myrtis SerKatz  . Paroxysmal atrial fibrillation (HCC)   . PREMATURE VENTRICULAR CONTRACTIONS, FREQUENT 01/31/2010   Qualifier: Diagnosis of  By: Gala RomneyBensimhon, MD, Trixie DredgeFACC, Daniel R   . 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[facility](HCC)    Mother had DM   . Typical atrial flutter (HCC)   . VENTRAL HERNIA 01/08/2008   Qualifier: Diagnosis of  By: Alwyn RenHopper MD, Chrissie NoaWilliam     Past Surgical History:  Procedure Laterality Date  . ANKLE FRACTURE SURGERY  1993  . APPENDECTOMY  1958  . CARDIAC CATHETERIZATION  2007 & 2011  . CARDIAC CATHETERIZATION N/A 01/13/2015   Procedure: Left Heart Cath and Coronary Angiography;  Surgeon: Peter M Swaziland, MD;  Location: [facility] INVASIVE CV LAB;  Service: Cardiovascular;  Laterality: N/A;  . CARDIOVERSION N/A 09/16/2017   Procedure: CARDIOVERSION;  Surgeon: Lars Masson, MD;  Location: [facility] ENDOSCOPY;  Service: Cardiovascular;  Laterality: N/A;  . COLONOSCOPY  2002   negative; Dr Juanda Chance  . HAND SURGERY  1965  . TONSILLECTOMY     Family History  Problem Relation Age of Onset  . Diabetes Mother   . Heart attack Father         4 vessel CBAG @ 88  .  Colon cancer Paternal Uncle   . Diabetes Brother   . Stroke Neg Hx    Social History   Socioeconomic History  . Marital status: Married    Spouse name: Not on file  . Number of children: Not on file  . Years of education: Not on file  . Highest education level: Not on file  Occupational History  . Occupation: Paediatric nurse / Scientist, research (medical)  Tobacco Use  . Smoking status: Former Smoker    Packs/day: 1.00    Quit date: 09/02/1990    Years since quitting: 30.0  . Smokeless tobacco: Never Used  . Tobacco comment: onset age 50-50 up to 1 ppd  Vaping Use  . Vaping Use: Never used  Substance and Sexual Activity  . Alcohol use: No  . Drug use: No  . Sexual activity: Not on file  Other Topics Concern  . Not on file  Social History Narrative  . Not on file   Social Determinants of Health   Financial Resource Strain: Low Risk   . Difficulty of Paying Living Expenses: Not hard at all  Food Insecurity: No Food Insecurity  . Worried About Programme researcher, broadcasting/film/video in the Last Year: Never true  . Ran Out of Food in the Last Year: Never true  Transportation Needs: No Transportation Needs  . Lack of Transportation (Medical): No  . Lack of Transportation (Non-Medical): No  Physical Activity: Inactive  . Days of Exercise per Week: 0 days  . Minutes of Exercise per Session: 0 min  Stress: No Stress Concern Present  . Feeling of Stress : Not at all  Social Connections: Socially Integrated  . Frequency of Communication with Friends and Family: More than three times a week  . Frequency of Social Gatherings with Friends and Family: More than three times a week  . Attends Religious Services: 1 to 4 times per year  . Active Member of Clubs or Organizations: Yes  . Attends Banker Meetings: 1 to 4 times per year  . Marital Status: Married    Tobacco Counseling Counseling given: Not Answered Comment: onset age 35-50 up to 1 ppd   Clinical Intake:  Pre-visit preparation completed:  Yes  Pain : No/denies pain     Nutritional Status: BMI > 30  Obese Nutritional Risks: None Diabetes: Yes CBG done?: No Did pt. bring in CBG monitor from home?: No  How often do you need to have someone help you when you read instructions, pamphlets, or other written materials from your doctor or pharmacy?: 1 - Never  Diabetes:  Is the patient diabetic?  Yes  If diabetic, was a CBG obtained today?  No  Did the patient bring in their glucometer from home?  No  How often do you monitor  your CBG's? never.   Financial Strains and Diabetes Management:  Are you having any financial strains with the device, your supplies or your medication? No .  Does the patient want to be seen by Chronic Care Management for management of their diabetes?  No  Would the patient like to be referred to a Nutritionist or for Diabetic Management?  No   Diabetic Exams:  Diabetic Eye Exam: Completed 08/18/2020.   Diabetic Foot Exam: Completed 08/30/2020.  Interpreter Needed?: No  Information entered by :: Caroleen Hamman LPN   Activities of Daily Living In your present state of health, do you have any difficulty performing the following activities: 09/11/2020 08/30/2020  Hearing? N N  Vision? N N  Difficulty concentrating or making decisions? N N  Walking or climbing stairs? N N  Dressing or bathing? N N  Doing errands, shopping? N N  Preparing Food and eating ? N -  Using the Toilet? N -  In the past six months, have you accidently leaked urine? N -  Do you have problems with loss of bowel control? N -  Managing your Medications? N -  Managing your Finances? N -  Housekeeping or managing your Housekeeping? N -  Some recent data might be hidden    Patient Care Team: Midge Minium, MD as PCP - General (Family Medicine) Martinique, Peter M, MD as Consulting Physician (Cardiology) Myrlene Broker, MD as Attending Physician (Urology) Druscilla Brownie, MD as Consulting Physician  (Dermatology) Kristeen Miss, MD as Consulting Physician (Neurosurgery) Deneise Lever, MD as Consulting Physician (Pulmonary Disease) Oneal Grout, DDS as Referring Physician (Orthodontics)  Indicate any recent Medical Services you may have received from other than [facility] providers in the past year (date may be approximate).     Assessment:   This is a routine wellness examination for Jacob Curry.  Hearing/Vision screen  Hearing Screening   125Hz  250Hz  500Hz  1000Hz  2000Hz  3000Hz  4000Hz  6000Hz  8000Hz   Right ear:           Left ear:           Comments: No issues  Vision Screening Comments: Reading glasses Dr. Gershon Crane  Dietary issues and exercise activities discussed: Current Exercise Habits: The patient does not participate in regular exercise at present, Exercise limited by: None identified  Goals    . Patient Stated     Increase activity    . Weight (lb) < 190 lb (86.2 kg)     Lose weight by controlling sugar/carb intake.       Depression Screen PHQ 2/9 Scores 09/11/2020 08/30/2020 03/01/2020 08/31/2019 06/02/2019 02/16/2019 06/11/2018  PHQ - 2 Score 0 0 0 0 0 0 0  PHQ- 9 Score - 0 - 0 - 0 0    Fall Risk Fall Risk  09/11/2020 08/30/2020 03/01/2020 08/31/2019 08/31/2019  Falls in the past year? 0 0 0 0 0  Number falls in past yr: 0 0 0 0 0  Injury with Fall? 0 0 0 0 0  Risk for fall due to : - No Fall Risks - - -  Follow up Falls prevention discussed - Falls evaluation completed Falls evaluation completed Falls evaluation completed    [place]:  Any stairs in or around the home? Yes  If so, are there any without handrails? No  Home free of loose throw rugs in walkways, pet beds, electrical cords, etc? Yes  Adequate lighting in your home to reduce risk of falls?  Yes   ASSISTIVE DEVICES UTILIZED TO PREVENT FALLS:  Life alert? No  Use of a cane, walker or w/c? No  Grab bars in the bathroom? Yes  Shower chair or bench in shower? No   Elevated toilet seat or a handicapped toilet? No   TIMED UP AND GO:  Was the test performed? Yes .  Length of time to ambulate 10 feet: 9 sec.   Gait steady and fast without use of assistive device  Cognitive Function:Normal cognitive status assessed by direct observation by this Nurse Health Advisor. No abnormalities found.   MMSE - Mini Mental State Exam 02/16/2018  Orientation to time 5  Orientation to Place 5  Registration 3  Attention/ Calculation 5  Recall 1  Language- name 2 objects 2  Language- repeat 1  Language- follow 3 step command 3  Language- read & follow direction 1  Write a sentence 1  Copy design 1  Total score 28        Immunizations Immunization History  Administered Date(s) Administered  . Fluad Quad(high Dose 65+) 05/24/2020  . Influenza, High Dose Seasonal PF 08/18/2013, 05/19/2019  . Influenza,inj,Quad PF,6+ Mos 07/04/2014, 06/22/2015, 06/10/2016, 06/16/2017, 05/15/2018  . PFIZER SARS-COV-2 Vaccination 09/23/2019, 10/14/2019, 07/12/2020  . Pneumococcal Conjugate-13 07/24/2015  . Pneumococcal Polysaccharide-23 07/23/2007  . Zoster 03/22/2008    TDAP status: Due, Education has been provided regarding the importance of this vaccine. Advised may receive this vaccine at local pharmacy or Health Dept. Aware to provide a copy of the vaccination record if obtained from local pharmacy or Health Dept. Verbalized acceptance and understanding.  Flu Vaccine status: Up to date  Pneumococcal vaccine status: Up to date  Covid-19 vaccine status: Completed vaccines  Qualifies for Shingles Vaccine? Yes   Zostavax completed Yes   Shingrix Completed?: No.    Education has been provided regarding the importance of this vaccine. Patient has been advised to call insurance company to determine out of pocket expense if they have not yet received this vaccine. Advised may also receive vaccine at local pharmacy or Health Dept. Verbalized acceptance and  understanding.  Screening Tests Health Maintenance  Topic Date Due  . COLONOSCOPY (Pts 45-13yrs Insurance coverage will need to be confirmed)  09/15/2019  . Hepatitis C Screening  03/01/2021 (Originally 05/06/1941)  . TETANUS/TDAP  08/30/2021 (Originally 01/03/1960)  . HEMOGLOBIN A1C  02/28/2021  . OPHTHALMOLOGY EXAM  08/18/2021  . FOOT EXAM  08/30/2021  . INFLUENZA VACCINE  Completed  . COVID-19 Vaccine  Completed  . PNA vac Low Risk Adult  Completed    Health Maintenance  Health Maintenance Due  Topic Date Due  . COLONOSCOPY (Pts 45-44yrs Insurance coverage will need to be confirmed)  09/15/2019    Colorectal cancer screening: No longer required.   Lung Cancer Screening: (Low Dose CT Chest recommended if Age 51-80 years, 30 pack-year currently smoking OR have quit w/in 15years.) does not qualify.    Additional Screening:  Hepatitis C Screening: does not qualify  Vision Screening: Recommended annual ophthalmology exams for early detection of glaucoma and other disorders of the eye. Is the patient up to date with their annual eye exam?  Yes  Who is the provider or what is the name of the office in which the patient attends annual eye exams? Dr. Gershon Crane   Dental Screening: Recommended annual dental exams for proper oral hygiene  Community Resource Referral / Chronic Care Management: CRR required this visit?  No   CCM required this visit?  No      Plan:     I have personally reviewed and noted the following in the patient's chart:   . Medical and social history . Use of alcohol, tobacco or illicit drugs  . Current medications and supplements . Functional ability and status . Nutritional status . Physical activity . Advanced directives . List of other physicians . Hospitalizations, surgeries, and ER visits in previous 12 months . Vitals . Screenings to include cognitive, depression, and falls . Referrals and appointments  In addition, I have reviewed and  discussed with patient certain preventive protocols, quality metrics, and best practice recommendations. A written personalized care plan for preventive services as well as general preventive health recommendations were provided to patient.   Patient would like to access avs on mychart.  Marta Antu, LPN   [id number]  Nurse Health Advisor  Nurse Notes: None

## 2020-09-11 ENCOUNTER — Other Ambulatory Visit: Payer: Self-pay

## 2020-09-11 ENCOUNTER — Ambulatory Visit (INDEPENDENT_AMBULATORY_CARE_PROVIDER_SITE_OTHER): Payer: Medicare Other

## 2020-09-11 VITALS — BP 138/76 | HR 88 | Temp 97.8°F | Resp 16 | Ht 68.0 in | Wt 202.4 lb

## 2020-09-11 DIAGNOSIS — Z0000 Encounter for general adult medical examination without abnormal findings: Secondary | ICD-10-CM | POA: Diagnosis not present

## 2020-09-11 NOTE — Patient Instructions (Signed)
Jacob Curry , Thank you for taking time to come for your Medicare Wellness Visit. I appreciate your ongoing commitment to your health goals. Please review the following plan we discussed and let me know if I can assist you in the future.   Screening recommendations/referrals: Colonoscopy: No longer required for screening purposes. Please follow recommendation form PCP & GI. Recommended yearly ophthalmology/optometry visit for glaucoma screening and checkup Recommended yearly dental visit for hygiene and checkup  Vaccinations: Influenza vaccine: Up to date Pneumococcal vaccine: Completed vaccines Tdap vaccine: Discuss with pharmacy Shingles vaccine: Discuss with pharmacy  Covid-19: Completed vaccines  Advanced directives: Declined information today  Conditions/risks identified: See problem list  Next appointment: Follow up in one year for your annual wellness visit.   Preventive Care 80 Years and Older, Male Preventive care refers to lifestyle choices and visits with your health care provider that can promote health and wellness. What does preventive care include?  A yearly physical exam. This is also called an annual well check.  Dental exams once or twice a year.  Routine eye exams. Ask your health care provider how often you should have your eyes checked.  Personal lifestyle choices, including:  Daily care of your teeth and gums.  Regular physical activity.  Eating a healthy diet.  Avoiding tobacco and drug use.  Limiting alcohol use.  Practicing safe sex.  Taking low doses of aspirin every day.  Taking vitamin and mineral supplements as recommended by your health care provider. What happens during an annual well check? The services and screenings done by your health care provider during your annual well check will depend on your age, overall health, lifestyle risk factors, and family history of disease. Counseling  Your health care provider may ask you questions  about your:  Alcohol use.  Tobacco use.  Drug use.  Emotional well-being.  Home and relationship well-being.  Sexual activity.  Eating habits.  History of falls.  Memory and ability to understand (cognition).  Work and work Statistician. Screening  You may have the following tests or measurements:  Height, weight, and BMI.  Blood pressure.  Lipid and cholesterol levels. These may be checked every 5 years, or more frequently if you are over 45 years old.  Skin check.  Lung cancer screening. You may have this screening every year starting at age 27 if you have a 30-pack-year history of smoking and currently smoke or have quit within the past 15 years.  Fecal occult blood test (FOBT) of the stool. You may have this test every year starting at age 34.  Flexible sigmoidoscopy or colonoscopy. You may have a sigmoidoscopy every 5 years or a colonoscopy every 10 years starting at age 90.  Prostate cancer screening. Recommendations will vary depending on your family history and other risks.  Hepatitis C blood test.  Hepatitis B blood test.  Sexually transmitted disease (STD) testing.  Diabetes screening. This is done by checking your blood sugar (glucose) after you have not eaten for a while (fasting). You may have this done every 1-3 years.  Abdominal aortic aneurysm (AAA) screening. You may need this if you are a current or former smoker.  Osteoporosis. You may be screened starting at age 45 if you are at high risk. Talk with your health care provider about your test results, treatment options, and if necessary, the need for more tests. Vaccines  Your health care provider may recommend certain vaccines, such as:  Influenza vaccine. This is recommended every year.  Tetanus, diphtheria, and acellular pertussis (Tdap, Td) vaccine. You may need a Td booster every 10 years.  Zoster vaccine. You may need this after age 69.  Pneumococcal 13-valent conjugate (PCV13)  vaccine. One dose is recommended after age 46.  Pneumococcal polysaccharide (PPSV23) vaccine. One dose is recommended after age 18. Talk to your health care provider about which screenings and vaccines you need and how often you need them. This information is not intended to replace advice given to you by your health care provider. Make sure you discuss any questions you have with your health care provider. Document Released: 09/15/2015 Document Revised: 05/08/2016 Document Reviewed: 06/20/2015 Elsevier Interactive Patient Education  2017 [place] Prevention in the Home Falls can cause injuries. They can happen to people of all ages. There are many things you can do to make your home safe and to help prevent falls. What can I do on the outside of my home?  Regularly fix the edges of walkways and driveways and fix any cracks.  Remove anything that might make you trip as you walk through a door, such as a raised step or threshold.  Trim any bushes or trees on the path to your home.  Use bright outdoor lighting.  Clear any walking paths of anything that might make someone trip, such as rocks or tools.  Regularly check to see if handrails are loose or broken. Make sure that both sides of any steps have handrails.  Any raised decks and porches should have guardrails on the edges.  Have any leaves, snow, or ice cleared regularly.  Use sand or salt on walking paths during winter.  Clean up any spills in your garage right away. This includes oil or grease spills. What can I do in the bathroom?  Use night lights.  Install grab bars by the toilet and in the tub and shower. Do not use towel bars as grab bars.  Use non-skid mats or decals in the tub or shower.  If you need to sit down in the shower, use a plastic, non-slip stool.  Keep the floor dry. Clean up any water that spills on the floor as soon as it happens.  Remove soap buildup in the tub or shower  regularly.  Attach bath mats securely with double-sided non-slip rug tape.  Do not have throw rugs and other things on the floor that can make you trip. What can I do in the bedroom?  Use night lights.  Make sure that you have a light by your bed that is easy to reach.  Do not use any sheets or blankets that are too big for your bed. They should not hang down onto the floor.  Have a firm chair that has side arms. You can use this for support while you get dressed.  Do not have throw rugs and other things on the floor that can make you trip. What can I do in the kitchen?  Clean up any spills right away.  Avoid walking on wet floors.  Keep items that you use a lot in easy-to-reach places.  If you need to reach something above you, use a strong step stool that has a grab bar.  Keep electrical cords out of the way.  Do not use floor polish or wax that makes floors slippery. If you must use wax, use non-skid floor wax.  Do not have throw rugs and other things on the floor that can make you trip. What can I do  with my stairs?  Do not leave any items on the stairs.  Make sure that there are handrails on both sides of the stairs and use them. Fix handrails that are broken or loose. Make sure that handrails are as long as the stairways.  Check any carpeting to make sure that it is firmly attached to the stairs. Fix any carpet that is loose or worn.  Avoid having throw rugs at the top or bottom of the stairs. If you do have throw rugs, attach them to the floor with carpet tape.  Make sure that you have a light switch at the top of the stairs and the bottom of the stairs. If you do not have them, ask someone to add them for you. What else can I do to help prevent falls?  Wear shoes that:  Do not have high heels.  Have rubber bottoms.  Are comfortable and fit you well.  Are closed at the toe. Do not wear sandals.  If you use a stepladder:  Make sure that it is fully  opened. Do not climb a closed stepladder.  Make sure that both sides of the stepladder are locked into place.  Ask someone to hold it for you, if possible.  Clearly mark and make sure that you can see:  Any grab bars or handrails.  First and last steps.  Where the edge of each step is.  Use tools that help you move around (mobility aids) if they are needed. These include:  Canes.  Walkers.  Scooters.  Crutches.  Turn on the lights when you go into a dark area. Replace any light bulbs as soon as they burn out.  Set up your furniture so you have a clear path. Avoid moving your furniture around.  If any of your floors are uneven, fix them.  If there are any pets around you, be aware of where they are.  Review your medicines with your doctor. Some medicines can make you feel dizzy. This can increase your chance of falling. Ask your doctor what other things that you can do to help prevent falls. This information is not intended to replace advice given to you by your health care provider. Make sure you discuss any questions you have with your health care provider. Document Released: 06/15/2009 Document Revised: 01/25/2016 Document Reviewed: 09/23/2014 Elsevier Interactive Patient Education  2017 [employer].

## 2020-10-06 NOTE — Progress Notes (Signed)
[name] Date of Birth: 24-Jan-1941 Medical Record #[id number]  History of Present Illness: Jacob Curry is seen for follow up CAD. He has a known history of coronary disease with cardiac catheterization in November 2007 showing total occlusion of a large left circumflex vessel with good left to left collaterals. There was 40% disease in the left main and origin of LAD. 90% mid diagonal, 80-90% mid RCA and 90% RV marginal branch.  Ejection fraction was 50-55%. Prior to this he had a nuclear stress test- he was able to exercise 8:30 with severe ischemia of the inferolateral wall. He has been managed medically. Repeat cardiac cath in May 2011 and May 2016 showed no significant change. He has a history of hypertension, hyperlipidemia, Pafib,  diabetes. He has a history of moderate obstructive sleep apnea. This is managed with an oral appliance. He was unable to tolerate CPAP therapy.  He is anticoagulated with Eliquis.  In December 2018 he developed Afib. Was evaluated in Afib clinic and underwent successful DCCV on 09/16/17. When last seen in June he was back in Afib but was asymptomatic.   On follow up today he is doing well. Gained some weight over Xmas. Is now going to gym at [facility] facility. No chest pain, dyspnea, palpitations, edema. Feels well.   Current Outpatient Medications on File Prior to Visit  Medication Sig Dispense Refill  . amLODipine (NORVASC) 2.5 MG tablet Take 1 tablet (2.5 mg total) by mouth daily. 90 tablet 2  . carvedilol (COREG) 12.5 MG tablet TAKE 1 AND 1/2 TABLETS TWICE DAILY 180 tablet 3  . ELIQUIS 5 MG TABS tablet TAKE 1 TABLET BY MOUTH TWICE A DAY 60 tablet 5  . EPINEPHrine 0.3 mg/0.3 mL IJ SOAJ injection Inject 0.3 mLs (0.3 mg total) into the muscle once. 1 Device 3  . fish oil-omega-3 fatty acids 1000 MG capsule Take 2 g by mouth daily.    Marland Kitchen losartan (COZAAR) 100 MG tablet TAKE 1 TABLET EVERY DAY 90 tablet 2  . metFORMIN (GLUCOPHAGE) 500 MG tablet TAKE 1 TABLET BY  MOUTH 2 (TWO) TIMES DAILY WITH A MEAL. 180 tablet 2  . Multiple Vitamin (MULTIVITAMIN) tablet Take 1 tablet by mouth daily.    . nitroGLYCERIN (NITROSTAT) 0.4 MG SL tablet Place 1 tablet (0.4 mg total) under the tongue every 5 (five) minutes as needed. 25 tablet 11  . Saw Palmetto, Serenoa repens, (SAW PALMETTO PO) Take 1 tablet by mouth as needed (prostate).    . simvastatin (ZOCOR) 40 MG tablet TAKE 1 TABLET AT BEDTIME 90 tablet 2  . VITAMIN D PO Take 1 tablet by mouth daily.     No current facility-administered medications on file prior to visit.    Allergies  Allergen Reactions  . Acetaminophen Other (See Comments)    Drug induced hepatitis  . Oxycodone-Acetaminophen Other (See Comments)    Drug induced hepatitis  . Tamsulosin Nausea And Vomiting    incontinence  . Flomax [Tamsulosin Hcl] Other (See Comments)    incontinence    Past Medical History:  Diagnosis Date  . CAD (coronary artery disease)    Dr Makynna Manocchio [place]  . CAD, NATIVE VESSEL 10/20/2008       . Carotid stenosis   . Complication of anesthesia    " DIFFICULTY WAKING "  . DM (diabetes mellitus) ([place])   . ED (erectile dysfunction)   . ERECTILE DYSFUNCTION 07/23/2007   Qualifier: Diagnosis of  By: Linna Darner MD, Gwyndolyn Saxon    . HLD (  hyperlipidemia)   . HTN (hypertension)   . Hyperlipidemia 10/20/2008   Qualifier: Diagnosis of  By: Haroldine Laws, MD, Eileen Stanford Myocardial infarction ([place]) 2007  . Obesity   . Obstructive sleep apnea 07/19/2010   NPSG 07/20/10- AHI 20.2/ hr   . OSA (obstructive sleep apnea)    oral appliance, Dr Ron Parker  . Paroxysmal atrial fibrillation (HCC)   . PREMATURE VENTRICULAR CONTRACTIONS, FREQUENT 01/31/2010   Qualifier: Diagnosis of  By: Haroldine Laws, MD, Eileen Stanford 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[facility])    Mother had DM   . Typical atrial flutter ([place])   . VENTRAL HERNIA 01/08/2008   Qualifier: Diagnosis of  By: Linna Darner MD, Gwyndolyn Saxon      Past Surgical History:   Procedure Laterality Date  . [place]  . APPENDECTOMY  1958  . CARDIAC CATHETERIZATION  2007 & 2011  . CARDIAC CATHETERIZATION N/A 01/13/2015   Procedure: Left Heart Cath and Coronary Angiography;  Surgeon: Day Greb M Martinique, MD;  Location: [place] [facility];  Service: Cardiovascular;  Laterality: N/A;  . CARDIOVERSION N/A 09/16/2017   Procedure: CARDIOVERSION;  Surgeon: Dorothy Spark, MD;  Location: [place];  Service: Cardiovascular;  Laterality: N/A;  . COLONOSCOPY  2002   negative; Dr Olevia Perches  . [place]  . TONSILLECTOMY      Social History   Tobacco Use  Smoking Status Former Smoker  . Packs/day: 1.00  . Quit date: 09/02/1990  . Years since quitting: 30.1  Smokeless Tobacco Never Used  Tobacco Comment   onset age [age] up to 1 ppd    Social History   Substance and Sexual Activity  Alcohol Use No    Family History  Problem Relation Age of Onset  . Diabetes Mother   . Heart attack Father         4 vessel CBAG @ 33  . Colon cancer Paternal Uncle   . Diabetes Brother   . Stroke Neg Hx     Review of Systems: As noted in history of present illness.  All other systems were reviewed and are negative.  Physical Exam: BP (!) 144/72   Pulse 76   Ht 5\' 8"  (1.727 m)   Wt 203 lb 9.6 oz (92.4 kg)   BMI 30.96 kg/m  GENERAL:  Well appearing obese WM in NAD HEENT:  PERRL, EOMI, sclera are clear. Oropharynx is clear. NECK:  No jugular venous distention, carotid upstroke brisk and symmetric, no bruits, no thyromegaly or adenopathy LUNGS:  Clear to auscultation bilaterally CHEST:  Unremarkable HEART:  IRRR,  PMI not displaced or sustained,S1 and S2 within normal limits, no S3, no S4: no clicks, no rubs, no murmurs ABD:  Soft, nontender. BS +, no masses or bruits. No hepatomegaly, no splenomegaly EXT:  2 + pulses throughout, no edema, no cyanosis no clubbing SKIN:  Warm and dry.  No rashes NEURO:  Alert and oriented x 3. Cranial nerves II  through XII intact. PSYCH:  Cognitively intact   LABORATORY DATA: Lab Results  Component Value Date   WBC 7.9 08/30/2020   HGB 14.6 08/30/2020   HCT 43.5 08/30/2020   PLT 179.0 08/30/2020   GLUCOSE 150 (H) 08/30/2020   CHOL 104 08/30/2020   TRIG 147.0 08/30/2020   HDL 44.60 08/30/2020   LDLDIRECT 40.0 02/16/2019   LDLCALC 30 08/30/2020   ALT 22 08/30/2020   AST 20 08/30/2020   NA  141 08/30/2020   K 4.2 08/30/2020   CL 106 08/30/2020   CREATININE 0.97 08/30/2020   BUN 21 08/30/2020   CO2 27 08/30/2020   TSH 3.06 08/30/2020   PSA 1.70 01/04/2009   INR 1.75 (H) 01/12/2015   HGBA1C 7.3 (H) 08/30/2020   MICROALBUR 0.4 03/09/2012   Ecg is not done today.    Assessment / Plan: 1. 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 Moderate to severe 3 vessel disease. Patient is class 0-1 on medical therapy.   Will continue medical therapy. Follow up in 6 months.  2. Hypertension, BP is a little high but was normal at PCP visit. Will monitor at home and let me know if staying elevated.   Continue current therapy  3. Hyperlipidemia well controlled on medication.  LDL 45 on Zocor.  4. Atrial fibrillation, suspect persistent now. S/p DCCV on 09/16/17. Recurrent as of June 2021.  He is asymptomatic and rate is well controlled. He has a Chadvasc score of 4. Continue anticoagulation with Eliquis 5 mg twice a day.   5. 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 Last A1c 7.3%. focus on weight loss.  I will follow up in 6 months.

## 2020-10-09 ENCOUNTER — Ambulatory Visit (INDEPENDENT_AMBULATORY_CARE_PROVIDER_SITE_OTHER): Payer: Medicare Other | Admitting: Cardiology

## 2020-10-09 ENCOUNTER — Encounter: Payer: Self-pay | Admitting: Cardiology

## 2020-10-09 ENCOUNTER — Other Ambulatory Visit: Payer: Self-pay

## 2020-10-09 VITALS — BP 144/72 | HR 76 | Ht 68.0 in | Wt 203.6 lb

## 2020-10-09 DIAGNOSIS — I25118 Atherosclerotic heart disease of native coronary artery with other forms of angina pectoris: Secondary | ICD-10-CM

## 2020-10-09 DIAGNOSIS — I480 Paroxysmal atrial fibrillation: Secondary | ICD-10-CM

## 2020-10-09 DIAGNOSIS — E7800 Pure hypercholesterolemia, unspecified: Secondary | ICD-10-CM | POA: Diagnosis not present

## 2020-10-09 DIAGNOSIS — I10 Essential (primary) hypertension: Secondary | ICD-10-CM | POA: Diagnosis not present

## 2020-10-09 NOTE — Patient Instructions (Signed)
Monitor your blood pressure - let me know if your blood pressure is staying elevated.

## 2021-02-28 ENCOUNTER — Encounter: Payer: Self-pay | Admitting: *Deleted

## 2021-03-01 ENCOUNTER — Other Ambulatory Visit: Payer: Self-pay

## 2021-03-01 ENCOUNTER — Ambulatory Visit (INDEPENDENT_AMBULATORY_CARE_PROVIDER_SITE_OTHER): Payer: Medicare Other | Admitting: Family Medicine

## 2021-03-01 ENCOUNTER — Encounter: Payer: Self-pay | Admitting: Family Medicine

## 2021-03-01 VITALS — BP 120/80 | HR 69 | Temp 97.5°F | Resp 19 | Ht 68.0 in | Wt 197.4 lb

## 2021-03-01 DIAGNOSIS — E669 Obesity, unspecified: Secondary | ICD-10-CM

## 2021-03-01 DIAGNOSIS — L570 Actinic keratosis: Secondary | ICD-10-CM | POA: Insufficient documentation

## 2021-03-01 DIAGNOSIS — E782 Mixed hyperlipidemia: Secondary | ICD-10-CM | POA: Diagnosis not present

## 2021-03-01 DIAGNOSIS — E1159 Type 2 diabetes mellitus with other circulatory complications: Secondary | ICD-10-CM | POA: Diagnosis not present

## 2021-03-01 DIAGNOSIS — Z86010 Personal history of colonic polyps: Secondary | ICD-10-CM | POA: Diagnosis not present

## 2021-03-01 DIAGNOSIS — I25118 Atherosclerotic heart disease of native coronary artery with other forms of angina pectoris: Secondary | ICD-10-CM

## 2021-03-01 DIAGNOSIS — I10 Essential (primary) hypertension: Secondary | ICD-10-CM | POA: Diagnosis not present

## 2021-03-01 LAB — BASIC METABOLIC PANEL
BUN: 16 mg/dL (ref 6–23)
CO2: 28 mEq/L (ref 19–32)
Calcium: 9.2 mg/dL (ref 8.4–10.5)
Chloride: 106 mEq/L (ref 96–112)
Creatinine, Ser: 1 mg/dL (ref 0.40–1.50)
GFR: 71.26 mL/min (ref >60.00)
Glucose, Bld: 136 mg/dL — ABNORMAL HIGH (ref 70–99)
Potassium: 4.3 mEq/L (ref 3.5–5.1)
Sodium: 142 mEq/L (ref 135–145)

## 2021-03-01 LAB — HEPATIC FUNCTION PANEL
ALT: 21 U/L (ref 0–53)
AST: 23 U/L (ref 0–37)
Albumin: 4.3 g/dL (ref 3.5–5.2)
Alkaline Phosphatase: 72 U/L (ref 39–117)
Bilirubin, Direct: 0.2 mg/dL (ref 0.0–0.3)
Total Bilirubin: 0.9 mg/dL (ref 0.2–1.2)
Total Protein: 6.7 g/dL (ref 6.0–8.3)

## 2021-03-01 LAB — LIPID PANEL
Cholesterol: 112 mg/dL (ref 0–200)
HDL: 45.6 mg/dL (ref >39.00)
LDL Cholesterol: 38 mg/dL (ref 0–99)
NonHDL: 66.74
Total CHOL/HDL Ratio: 2
Triglycerides: 146 mg/dL (ref 0.0–149.0)
VLDL: 29.2 mg/dL (ref 0.0–40.0)

## 2021-03-01 LAB — CBC WITH DIFFERENTIAL/PLATELET
Basophils Absolute: 0.2 10*3/uL — ABNORMAL HIGH (ref 0.0–0.1)
Basophils Relative: 1.8 % (ref 0.0–3.0)
Eosinophils Absolute: 0.2 10*3/uL (ref 0.0–0.7)
Eosinophils Relative: 2.2 % (ref 0.0–5.0)
HCT: 43.8 % (ref 39.0–52.0)
Hemoglobin: 14.8 g/dL (ref 13.0–17.0)
Lymphocytes Relative: 25.9 % (ref 12.0–46.0)
Lymphs Abs: 2.4 10*3/uL (ref 0.7–4.0)
MCHC: 33.8 g/dL (ref 30.0–36.0)
MCV: 90.8 fl (ref 78.0–100.0)
Monocytes Absolute: 0.7 10*3/uL (ref 0.1–1.0)
Monocytes Relative: 7.5 % (ref 3.0–12.0)
Neutro Abs: 5.8 10*3/uL (ref 1.4–7.7)
Neutrophils Relative %: 62.6 % (ref 43.0–77.0)
Platelets: 187 10*3/uL (ref 150.0–400.0)
RBC: 4.82 Mil/uL (ref 4.22–5.81)
RDW: 13.9 % (ref 11.5–15.5)
WBC: 9.3 10*3/uL (ref 4.0–10.5)

## 2021-03-01 LAB — HEMOGLOBIN A1C: Hgb A1c MFr Bld: 7.1 % — ABNORMAL HIGH (ref 4.6–6.5)

## 2021-03-01 LAB — TSH: TSH: 2.74 u[IU]/mL (ref 0.35–5.50)

## 2021-03-01 NOTE — Assessment & Plan Note (Signed)
Referred back to GI to determine if repeat colonoscopy is needed given his age

## 2021-03-01 NOTE — Assessment & Plan Note (Signed)
Chronic problem.  Tolerating Metformin w/o difficulty.  UTD on eye exam, foot exam.  On ARB for renal protection.  Check labs.  Adjust meds prn

## 2021-03-01 NOTE — Assessment & Plan Note (Signed)
Chronic problem.  On Simvastatin 40mg daily w/o difficulty.  Check labs.  Adjust meds prn  

## 2021-03-01 NOTE — Progress Notes (Signed)
   Subjective:    Patient ID: Jacob Curry, male    DOB: September 10, 1940, 80 y.o.   MRN: [id number]  HPI DM- chronic problem, on Metformin 500mg  BID.  UTD on eye exam, foot exam.  On ARB for renal protection.  Denies symptomatic lows.  No numbness/tingling of hands/feet  HTN- chronic problem, on Amlodipine 2.5mg  daily, Coreg 18.75mg  BID, Losartan 100mg  daily.  No CP, SOB, HAs, visual changes.  Hyperlipidemia- chronic problem, on Simvastatin 40mg  daily.  No abd pain, N/V  Obesity- pt is down 7 lbs since last visit.  Pt is working out daily   Review of Systems For ROS see HPI   This visit occurred during the SARS-CoV-2 public health emergency.  Safety protocols were in place, including screening questions prior to the visit, additional usage of staff PPE, and extensive cleaning of exam room while observing appropriate contact time as indicated for disinfecting solutions.      Objective:   Physical Exam Vitals reviewed.  Constitutional:      General: He is not in acute distress.    Appearance: Normal appearance. He is well-developed. He is not ill-appearing.  HENT:     Head: Normocephalic and atraumatic.  Eyes:     Extraocular Movements: Extraocular movements intact.     Conjunctiva/sclera: Conjunctivae normal.     Pupils: Pupils are equal, round, and reactive to light.  Neck:     Thyroid: No thyromegaly.  Cardiovascular:     Rate and Rhythm: Normal rate and regular rhythm.     Pulses: Normal pulses.     Heart sounds: Normal heart sounds. No murmur heard. Pulmonary:     Effort: Pulmonary effort is normal. No respiratory distress.     Breath sounds: Normal breath sounds.  Abdominal:     General: Bowel sounds are normal. There is no distension.     Palpations: Abdomen is soft.  Musculoskeletal:     Cervical back: Normal range of motion and neck supple.     Right lower leg: No edema.     Left lower leg: No edema.  Lymphadenopathy:     Cervical: No cervical adenopathy.   Skin:    General: Skin is warm and dry.  Neurological:     General: No focal deficit present.     Mental Status: He is alert and oriented to person, place, and time.     Cranial Nerves: No cranial nerve deficit.  Psychiatric:        Mood and Affect: Mood normal.        Behavior: Behavior normal.          Assessment & Plan:

## 2021-03-01 NOTE — Assessment & Plan Note (Signed)
Pt is down 7 lbs and now exercising daily.  Applauded his efforts!

## 2021-03-01 NOTE — Patient Instructions (Addendum)
Follow up in 6 months to recheck DM, BP, and cholesterol We'll notify you of your lab results and make any changes if needed GI should call you to figure out the colonoscopy thing Continue to work on healthy diet and regular exercise- you're doing it!  You're down 7 lbs! Call with any questions or concerns Stay Safe!  Stay Healthy! Have a great summer!

## 2021-03-01 NOTE — Assessment & Plan Note (Signed)
Chronic problem.  Well controlled on Amlodipine, Coreg, and Losartan.  Currently asymptomatic.  Check labs.  No anticipated med changes.  Will follow.

## 2021-03-19 ENCOUNTER — Encounter: Payer: Self-pay | Admitting: Gastroenterology

## 2021-04-30 ENCOUNTER — Ambulatory Visit (INDEPENDENT_AMBULATORY_CARE_PROVIDER_SITE_OTHER): Payer: Medicare Other | Admitting: Gastroenterology

## 2021-04-30 ENCOUNTER — Encounter: Payer: Self-pay | Admitting: Gastroenterology

## 2021-04-30 VITALS — BP 138/66 | HR 67 | Ht 68.0 in | Wt 199.0 lb

## 2021-04-30 DIAGNOSIS — Z01818 Encounter for other preprocedural examination: Secondary | ICD-10-CM

## 2021-04-30 DIAGNOSIS — Z86010 Personal history of colonic polyps: Secondary | ICD-10-CM

## 2021-04-30 DIAGNOSIS — Z800 Family history of malignant neoplasm of digestive organs: Secondary | ICD-10-CM

## 2021-04-30 NOTE — Progress Notes (Signed)
HPI : Jacob Curry a pleasant 80 year old male with a history of atrial fibrillation and coronary artery disease on Eliquis who was referred to [place] by Dr. Birdie Riddle to discuss performing further surveillance colonoscopies.  The patient has had 2 colonoscopies in his life, in 2002 and in 2016.  The colonoscopy in 2002 was normal.  The colonoscopy in 2016 was notable for 3 rectal polyps, only 1 of which was an adenoma.  The patient had an uncle who was diagnosed with colon cancer in his 105s, otherwise no family history of colon cancer.  The patient denies any chronic GI symptoms to include abdominal pain, constipation, diarrhea or blood in the stool.  His weight has been stable.    Past Medical History:  Diagnosis Date   CAD (coronary artery disease)    Dr Peter Martinique   CAD, NATIVE VESSEL 10/20/2008        Carotid stenosis    Complication of anesthesia    " DIFFICULTY WAKING "   DM (diabetes mellitus) ([place])    ED (erectile dysfunction)    ERECTILE DYSFUNCTION 07/23/2007   Qualifier: Diagnosis of  By: Linna Darner MD, Gwyndolyn Saxon     HLD (hyperlipidemia)    HTN (hypertension)    Hyperlipidemia 10/20/2008   Qualifier: Diagnosis of  By: Haroldine Laws, MD, Eileen Stanford    Myocardial infarction ([place]) 2007   Obesity    Obstructive sleep apnea 07/19/2010   NPSG 07/20/10- AHI 20.2/ hr    OSA (obstructive sleep apnea)    oral appliance, Dr Ron Parker   Paroxysmal atrial fibrillation ([place])    PREMATURE VENTRICULAR CONTRACTIONS, FREQUENT 01/31/2010   Qualifier: Diagnosis of  By: Haroldine Laws, MD, Eileen Stanford    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[facility])    Mother had DM    Typical atrial flutter ([place])    VENTRAL HERNIA 01/08/2008   Qualifier: Diagnosis of  By: Linna Darner MD, Gwyndolyn Saxon       Past Surgical History:  Procedure Laterality Date   [place]  2007 & 2011   CARDIAC CATHETERIZATION N/A 01/13/2015   Procedure: Left Heart Cath and  Coronary Angiography;  Surgeon: Peter M Martinique, MD;  Location: [place] [facility];  Service: Cardiovascular;  Laterality: N/A;   CARDIOVERSION N/A 09/16/2017   Procedure: CARDIOVERSION;  Surgeon: Dorothy Spark, MD;  Location: [facility] ENDOSCOPY;  Service: Cardiovascular;  Laterality: N/A;   COLONOSCOPY  2002   negative; Dr Olevia Perches   HAND SURGERY  1965   TONSILLECTOMY     Colonoscopy Jan 2016:  3 rectal polyps (1 TA and 2 HP)  Family History  Problem Relation Age of Onset   Diabetes Mother    Heart attack Father         4 vessel CBAG @ 9   Colon cancer Paternal Uncle    Diabetes Brother    Stroke Neg Hx    Social History   Tobacco Use   Smoking status: Former    Packs/day: 1.00    Types: Cigarettes    Quit date: 09/02/1990    Years since quitting: 30.6   Smokeless tobacco: Never   Tobacco comments:    onset age [age] up to 1 ppd  Vaping Use   Vaping Use: Never used  Substance Use Topics   Alcohol use: No   Drug use: No   Current Outpatient Medications  Medication Sig Dispense Refill   amLODipine (NORVASC)  2.5 MG tablet Take 1 tablet (2.5 mg total) by mouth daily. 90 tablet 2   carvedilol (COREG) 12.5 MG tablet TAKE 1 AND 1/2 TABLETS TWICE DAILY 180 tablet 3   ELIQUIS 5 MG TABS tablet TAKE 1 TABLET BY MOUTH TWICE A DAY 60 tablet 5   EPINEPHrine 0.3 mg/0.3 mL IJ SOAJ injection Inject 0.3 mLs (0.3 mg total) into the muscle once. 1 Device 3   fish oil-omega-3 fatty acids 1000 MG capsule Take 2 g by mouth daily.     losartan (COZAAR) 100 MG tablet TAKE 1 TABLET EVERY DAY 90 tablet 2   metFORMIN (GLUCOPHAGE) 500 MG tablet TAKE 1 TABLET BY MOUTH 2 (TWO) TIMES DAILY WITH A MEAL. 180 tablet 2   Multiple Vitamin (MULTIVITAMIN) tablet Take 1 tablet by mouth daily.     nitroGLYCERIN (NITROSTAT) 0.4 MG SL tablet Place 1 tablet (0.4 mg total) under the tongue every 5 (five) minutes as needed. 25 tablet 11   Saw Palmetto, Serenoa repens, (SAW PALMETTO PO) Take 1 tablet by mouth as needed  (prostate).     simvastatin (ZOCOR) 40 MG tablet TAKE 1 TABLET AT BEDTIME 90 tablet 2   VITAMIN D PO Take 1 tablet by mouth daily.     No current facility-administered medications for this visit.   Allergies  Allergen Reactions   Acetaminophen Other (See Comments)    Drug induced hepatitis   Oxycodone-Acetaminophen Other (See Comments)    Drug induced hepatitis   Tamsulosin Nausea And Vomiting    incontinence   Flomax [Tamsulosin Hcl] Other (See Comments)    incontinence     Review of Systems: All systems reviewed and negative except where noted in HPI.    No results found.  Physical Exam: BP 138/66   Pulse 67   Ht '5\' 8"'$  (1.727 m)   Wt 199 lb (90.3 kg)   BMI 30.26 kg/m  Constitutional: Pleasant,well-developed, Caucasian male in no acute distress. HEENT: Normocephalic and atraumatic. Conjunctivae are normal. No scleral icterus. Neck supple.  Cardiovascular: Normal rate, regular rhythm.  Pulmonary/chest: Effort normal and breath sounds normal. No wheezing, rales or rhonchi. Abdominal: Soft, nondistended, nontender. Bowel sounds active throughout. There are no masses palpable. No hepatomegaly. Extremities: no edema Neurological: Alert and oriented to person place and time. Skin: Skin is warm and dry. No rashes noted. Psychiatric: Normal mood and affect. Behavior is normal.  CBC    Component Value Date/Time   WBC 9.3 03/01/2021 0856   RBC 4.82 03/01/2021 0856   HGB 14.8 03/01/2021 0856   HCT 43.8 03/01/2021 0856   PLT 187.0 03/01/2021 0856   MCV 90.8 03/01/2021 0856   MCH 29.3 02/16/2018 1509   MCHC 33.8 03/01/2021 0856   RDW 13.9 03/01/2021 0856   LYMPHSABS 2.4 03/01/2021 0856   MONOABS 0.7 03/01/2021 0856   EOSABS 0.2 03/01/2021 0856   BASOSABS 0.2 (H) 03/01/2021 0856    CMP     Component Value Date/Time   NA 142 03/01/2021 0856   K 4.3 03/01/2021 0856   CL 106 03/01/2021 0856   CO2 28 03/01/2021 0856   GLUCOSE 136 (H) 03/01/2021 0856   GLUCOSE 122  (H) 06/09/2006 1229   BUN 16 03/01/2021 0856   CREATININE 1.00 03/01/2021 0856   CREATININE 0.84 02/16/2018 1509   CALCIUM 9.2 03/01/2021 0856   PROT 6.7 03/01/2021 0856   ALBUMIN 4.3 03/01/2021 0856   AST 23 03/01/2021 0856   ALT 21 03/01/2021 0856   ALKPHOS 72 03/01/2021  0856   BILITOT 0.9 03/01/2021 0856   GFRNONAA >60 09/10/2017 1030   GFRAA >60 09/10/2017 1030     ASSESSMENT AND PLAN: 80 year old male with history of atrial fibrillation and coronary artery disease referred to discuss doing further surveillance colonoscopy.  The patient had a single tubular adenoma in 2016 and no polyps on his initial colonoscopy in 2002.  He is low risk for colon cancer.  Current guidelines would suggest he would not need a surveillance colonoscopy for 7 to 10 years from his last one also suggest stopping colon cancer screening age 19.  Given his advanced age, his cardiac comorbidities absence of any history of high risk polyps, I think that the risks of a colonoscopy, even though small, outweigh the potential benefits in this patient.  The patient was in agreement with this, and was not eager to undergo another colonoscopy and unless it was absolutely necessary.    Personal history of colon polyps -Recommend against further polyp surveillance  Jaythan Hinely E. Candis Schatz, MD [facility]    Midge Minium, MD

## 2021-04-30 NOTE — Patient Instructions (Signed)
If you are age 80 or older, your body mass index should be between 23-30. Your Body mass index is 30.26 kg/m. If this is out of the aforementioned range listed, please consider follow up with your Primary Care Provider.  If you are age 85 or younger, your body mass index should be between 19-25. Your Body mass index is 30.26 kg/m. If this is out of the aformentioned range listed, please consider follow up with your Primary Care Provider.    The [place] [facility] providers would like to encourage you to use [facility] to communicate with providers for non-urgent requests or questions.  Due to long hold times on the telephone, sending your provider a message by [facility] may be a faster and more efficient way to get a response.  Please allow 48 business hours for a response.  Please remember that this is for non-urgent requests.   It was a pleasure to see you today!  Thank you for trusting me with your gastrointestinal care!    Scott E. Candis Schatz, MD

## 2021-06-08 NOTE — Progress Notes (Signed)
Jacob Curry Date of Birth: [birth year] Medical Record #[id number]  History of Present Illness: Jacob Curry is seen for follow up CAD. He has a known history of coronary disease with cardiac catheterization in November 2007 showing total occlusion of a large left circumflex vessel with good left to left collaterals. There was 40% disease in the left main and origin of LAD. 90% mid diagonal, 80-90% mid RCA and 90% RV marginal branch.  Ejection fraction was 50-55%. Prior to this he had a nuclear stress test- he was able to exercise 8:30 with severe ischemia of the inferolateral wall. He has been managed medically. Repeat cardiac cath in May 2011 and May 2016 showed no significant change. He has a history of hypertension, hyperlipidemia, Pafib,  diabetes. He has a history of moderate obstructive sleep apnea. This is managed with an oral appliance. He was unable to tolerate CPAP therapy.  He is anticoagulated with Eliquis.  In December 2018 he developed Afib. Was evaluated in Afib clinic and underwent successful DCCV on 09/16/17. When last seen in June he was back in Afib but was asymptomatic.   On follow up today he is doing well.  Is now going to gym at [facility] daily. No chest pain, dyspnea, palpitations, edema. Has been under a lot of stress recently due to wife having major back surgery. Just feels agitated.   Current Outpatient Medications on File Prior to Visit  Medication Sig Dispense Refill   amLODipine (NORVASC) 2.5 MG tablet Take 1 tablet (2.5 mg total) by mouth daily. 90 tablet 2   carvedilol (COREG) 12.5 MG tablet TAKE 1 AND 1/2 TABLETS TWICE DAILY 180 tablet 3   ELIQUIS 5 MG TABS tablet TAKE 1 TABLET BY MOUTH TWICE A DAY 60 tablet 5   EPINEPHrine 0.3 mg/0.3 mL IJ SOAJ injection Inject 0.3 mLs (0.3 mg total) into the muscle once. 1 Device 3   fish oil-omega-3 fatty acids 1000 MG capsule Take 2 g by mouth daily.     losartan (COZAAR) 100 MG tablet TAKE 1 TABLET EVERY DAY 90 tablet  2   metFORMIN (GLUCOPHAGE) 500 MG tablet TAKE 1 TABLET BY MOUTH 2 (TWO) TIMES DAILY WITH A MEAL. 180 tablet 2   Multiple Vitamin (MULTIVITAMIN) tablet Take 1 tablet by mouth daily.     nitroGLYCERIN (NITROSTAT) 0.4 MG SL tablet Place 1 tablet (0.4 mg total) under the tongue every 5 (five) minutes as needed. 25 tablet 11   Saw Palmetto, Serenoa repens, (SAW PALMETTO PO) Take 1 tablet by mouth as needed (prostate).     simvastatin (ZOCOR) 40 MG tablet TAKE 1 TABLET AT BEDTIME 90 tablet 2   VITAMIN D PO Take 1 tablet by mouth daily.     No current facility-administered medications on file prior to visit.    Allergies  Allergen Reactions   Acetaminophen Other (See Comments)    Drug induced hepatitis   Oxycodone-Acetaminophen Other (See Comments)    Drug induced hepatitis   Tamsulosin Nausea And Vomiting    incontinence   Flomax [Tamsulosin Hcl] Other (See Comments)    incontinence    Past Medical History:  Diagnosis Date   CAD (coronary artery disease)    Dr Jacob Curry   CAD, NATIVE VESSEL 10/20/2008        Carotid stenosis    Complication of anesthesia    " DIFFICULTY WAKING "   DM (diabetes mellitus) ([place])    ED (erectile dysfunction)    ERECTILE DYSFUNCTION 07/23/2007  Qualifier: Diagnosis of  By: Jacob Curry     HLD (hyperlipidemia)    HTN (hypertension)    Hyperlipidemia 10/20/2008   Qualifier: Diagnosis of  By: Jacob Curry    Myocardial infarction [facility]) 2007   Obesity    Obstructive sleep apnea 07/19/2010   NPSG 07/20/10- AHI 20.2/ hr    OSA (obstructive sleep apnea)    oral appliance, Dr Jacob Curry   Paroxysmal atrial fibrillation [facility])    PREMATURE VENTRICULAR CONTRACTIONS, FREQUENT 01/31/2010   Qualifier: Diagnosis of  By: Jacob Curry    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  2007 & 2011   CARDIAC CATHETERIZATION N/A 01/13/2015   Procedure: Left Heart Cath and Coronary Angiography;  Surgeon: Jacob Curry;  Location: [place] CV LAB;  Service: Cardiovascular;  Laterality: N/A;   CARDIOVERSION N/A 09/16/2017   Procedure: CARDIOVERSION;  Surgeon: Jacob Curry;  Location: [facility] ENDOSCOPY;  Service: Cardiovascular;  Laterality: N/A;   COLONOSCOPY  2002   negative; Dr Jacob Curry   HAND SURGERY  1965   TONSILLECTOMY      Social History   Tobacco Use  Smoking Status Former   Packs/day: 1.00   Types: Cigarettes   Quit date: 09/02/1990   Years since quitting: 30.7  Smokeless Tobacco Never  Tobacco Comments   onset age [age] up to 1 ppd    Social History   Substance and Sexual Activity  Alcohol Use No    Family History  Problem Relation Age of Onset   Diabetes Mother    Heart attack Father         4 vessel CBAG @ 56   Colon cancer Paternal Uncle    Diabetes Brother    Stroke Neg Hx     Review of Systems: As noted in history of present illness.  All other systems were reviewed and are negative.  Physical Exam: BP 129/87   Pulse 78   Ht 5\' 8"  (1.727 m)   Wt 200 lb 12.8 oz (91.1 kg)   SpO2 98%   BMI 30.53 kg/m  GENERAL:  Well appearing obese WM in NAD HEENT:  PERRL, EOMI, sclera are clear. Oropharynx is clear. NECK:  No jugular venous distention, carotid upstroke brisk and symmetric, no bruits, no thyromegaly or adenopathy LUNGS:  Clear to auscultation bilaterally CHEST:  Unremarkable HEART:  IRRR,  PMI not displaced or sustained,S1 and S2 within normal limits, no S3, no S4: no clicks, no rubs, no murmurs ABD:  Soft, nontender. BS +, no masses or bruits. No hepatomegaly, no splenomegaly EXT:  2 + pulses throughout, no edema, no cyanosis no clubbing SKIN:  Warm and dry.  No rashes NEURO:  Alert and  oriented x 3. Cranial nerves II through XII intact. PSYCH:  Cognitively intact   LABORATORY DATA: Lab Results  Component Value Date   WBC 9.3 03/01/2021   HGB 14.8 03/01/2021   HCT 43.8 03/01/2021   PLT 187.0 03/01/2021   GLUCOSE 136 (H) 03/01/2021   CHOL 112 03/01/2021   TRIG 146.0 03/01/2021   HDL 45.60 03/01/2021   LDLDIRECT 40.0 02/16/2019  LDLCALC 38 03/01/2021   ALT 21 03/01/2021   AST 23 03/01/2021   NA 142 03/01/2021   K 4.3 03/01/2021   CL 106 03/01/2021   CREATININE 1.00 03/01/2021   BUN 16 03/01/2021   CO2 28 03/01/2021   TSH 2.74 03/01/2021   PSA 1.70 01/04/2009   INR 1.75 (H) 01/12/2015   HGBA1C 7.1 (H) 03/01/2021   MICROALBUR 0.4 03/09/2012   Ecg is not done today.    Assessment / Plan: 1. 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 Moderate to severe 3 vessel disease. Patient is class 0-1 on medical therapy.   Will continue medical therapy. Follow up in 6 months.  2. Hypertension, BP is well controlled. Continue current therapy  3. Hyperlipidemia well controlled on medication.  LDL 45 on Zocor.  4. Atrial fibrillation, suspect persistent now. S/p DCCV on 09/16/17. Recurrent as of June 2021.  He is asymptomatic and rate is well controlled. He has a Chadvasc score of 4. Continue anticoagulation with Eliquis 5 mg twice a day.   5. 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 Last A1c 7.1%. focus on weight loss.  I will follow up in 6 months.

## 2021-06-11 ENCOUNTER — Encounter: Payer: Self-pay | Admitting: Cardiology

## 2021-06-11 ENCOUNTER — Ambulatory Visit (INDEPENDENT_AMBULATORY_CARE_PROVIDER_SITE_OTHER): Payer: Medicare Other | Admitting: Cardiology

## 2021-06-11 ENCOUNTER — Other Ambulatory Visit: Payer: Self-pay

## 2021-06-11 VITALS — BP 129/87 | HR 78 | Ht 68.0 in | Wt 200.8 lb

## 2021-06-11 DIAGNOSIS — I480 Paroxysmal atrial fibrillation: Secondary | ICD-10-CM | POA: Diagnosis not present

## 2021-06-11 DIAGNOSIS — E7800 Pure hypercholesterolemia, unspecified: Secondary | ICD-10-CM

## 2021-06-11 DIAGNOSIS — I10 Essential (primary) hypertension: Secondary | ICD-10-CM | POA: Diagnosis not present

## 2021-06-11 DIAGNOSIS — I25118 Atherosclerotic heart disease of native coronary artery with other forms of angina pectoris: Secondary | ICD-10-CM

## 2021-06-11 NOTE — Patient Instructions (Signed)
Medication Instructions:  Continue same meds *If you need a refill on your cardiac medications before your next appointment, please call your pharmacy*   Lab Work: None ordered   Testing/Procedures: None ordered   Follow-Up: At [facility], you and your health needs are our priority.  As part of our continuing mission to provide you with exceptional heart care, we have created designated Provider Care Teams.  These Care Teams include your primary Cardiologist (physician) and Advanced Practice Providers (APPs -  Physician Assistants and Nurse Practitioners) who all work together to provide you with the care you need, when you need it.  We recommend signing up for the patient portal called "MyChart".  Sign up information is provided on this After Visit Summary.  MyChart is used to connect with patients for Virtual Visits (Telemedicine).  Patients are able to view lab/test results, encounter notes, upcoming appointments, etc.  Non-urgent messages can be sent to your provider as well.   To learn more about what you can do with MyChart, go to [email or web address].     Your next appointment:  6 months   Call in Jan to schedule April appointment    The format for your next appointment:Office    Provider: Dr.Jordan

## 2021-06-12 ENCOUNTER — Ambulatory Visit (INDEPENDENT_AMBULATORY_CARE_PROVIDER_SITE_OTHER): Payer: Medicare Other | Admitting: Family Medicine

## 2021-06-12 DIAGNOSIS — Z23 Encounter for immunization: Secondary | ICD-10-CM

## 2021-06-19 DIAGNOSIS — Z23 Encounter for immunization: Secondary | ICD-10-CM

## 2021-06-19 NOTE — Progress Notes (Signed)
After obtaining consent, and per orders of Dr. Birdie Riddle, flu shot given by Juliann Pulse.

## 2021-06-19 NOTE — Progress Notes (Deleted)
After obtaining consent, and per orders of Dr. Birdie Riddle, Flu Shot was given by Juliann Pulse.

## 2021-08-18 ENCOUNTER — Other Ambulatory Visit: Payer: Self-pay

## 2021-08-18 ENCOUNTER — Emergency Department (HOSPITAL_BASED_OUTPATIENT_CLINIC_OR_DEPARTMENT_OTHER)
Admission: EM | Admit: 2021-08-18 | Discharge: 2021-08-18 | Disposition: A | Discharge status: Home / Self Care | Payer: Medicare Other | Attending: Emergency Medicine | Admitting: Emergency Medicine

## 2021-08-18 ENCOUNTER — Encounter (HOSPITAL_BASED_OUTPATIENT_CLINIC_OR_DEPARTMENT_OTHER): Payer: Self-pay | Admitting: Emergency Medicine

## 2021-08-18 DIAGNOSIS — I2510 Atherosclerotic heart disease of native coronary artery without angina pectoris: Secondary | ICD-10-CM | POA: Insufficient documentation

## 2021-08-18 DIAGNOSIS — Z87891 Personal history of nicotine dependence: Secondary | ICD-10-CM | POA: Insufficient documentation

## 2021-08-18 DIAGNOSIS — I480 Paroxysmal atrial fibrillation: Secondary | ICD-10-CM | POA: Diagnosis not present

## 2021-08-18 DIAGNOSIS — U071 COVID-19: Secondary | ICD-10-CM | POA: Diagnosis not present

## 2021-08-18 DIAGNOSIS — Z7901 Long term (current) use of anticoagulants: Secondary | ICD-10-CM | POA: Diagnosis not present

## 2021-08-18 DIAGNOSIS — E119 Type 2 diabetes mellitus without complications: Secondary | ICD-10-CM | POA: Insufficient documentation

## 2021-08-18 DIAGNOSIS — I119 Hypertensive heart disease without heart failure: Secondary | ICD-10-CM | POA: Diagnosis not present

## 2021-08-18 DIAGNOSIS — Z79899 Other long term (current) drug therapy: Secondary | ICD-10-CM | POA: Insufficient documentation

## 2021-08-18 DIAGNOSIS — R059 Cough, unspecified: Secondary | ICD-10-CM | POA: Diagnosis present

## 2021-08-18 LAB — RESP PANEL BY RT-PCR (FLU A&B, COVID) ARPGX2
Influenza A by PCR: NEGATIVE
Influenza B by PCR: NEGATIVE
SARS Coronavirus 2 by RT PCR: POSITIVE — AB

## 2021-08-18 MED ORDER — NIRMATRELVIR/RITONAVIR (PAXLOVID)TABLET: 3.0000 | ORAL_TABLET | Freq: Two times a day (BID) | ORAL | 0 refills | Status: DC

## 2021-08-18 MED ORDER — BENZONATATE 100 MG PO CAPS: 100.0000 mg | ORAL_CAPSULE | Freq: Three times a day (TID) | ORAL | 0 refills | Status: DC | PRN

## 2021-08-18 MED ORDER — MOLNUPIRAVIR EUA 200MG CAPSULE: 4.0000 | ORAL_CAPSULE | Freq: Two times a day (BID) | ORAL | 0 refills | Status: AC

## 2021-08-18 NOTE — ED Notes (Addendum)
Pt denied pain stated that he felt fine. Pt ambulatory and good disposition. I inquired about a final set of vitals and pt stated that he was ready to go. Pt was close to 1 hour mark from initial vitals

## 2021-08-18 NOTE — ED Provider Notes (Signed)
[place] EMERGENCY DEPT Provider Note   CSN: [id number] Arrival date & time: 08/18/21  4888     History Chief Complaint  Patient presents with   Cough    Jacob Curry is a 80 y.o. male.  Presented to the emergency room with concern for cough.  Patient states yesterday he started having mild nonproductive cough, had bad coughing throughout the night and had difficulty sleeping due to the cough.  Today he feels fine except for the cough.  Did check COVID test which was positive at home.  Denies any difficulty breathing or chest pain.  No chills or fevers.  Has history of CAD, diabetes, hypertension, hyperlipidemia, A. fib.  Reports he is fully vaccinated against COVID and has received 1 booster.  HPI     Past Medical History:  Diagnosis Date   CAD (coronary artery disease)    Dr Peter Martinique   CAD, NATIVE VESSEL 10/20/2008        Carotid stenosis    Complication of anesthesia    " DIFFICULTY WAKING "   DM (diabetes mellitus) ([place])    ED (erectile dysfunction)    ERECTILE DYSFUNCTION 07/23/2007   Qualifier: Diagnosis of  By: Linna Darner MD, Gwyndolyn Saxon     HLD (hyperlipidemia)    HTN (hypertension)    Hyperlipidemia 10/20/2008   Qualifier: Diagnosis of  By: Haroldine Laws, MD, Eileen Stanford    Myocardial infarction ([place]) 2007   Obesity    Obstructive sleep apnea 07/19/2010   NPSG 07/20/10- AHI 20.2/ hr    OSA (obstructive sleep apnea)    oral appliance, Dr Ron Parker   Paroxysmal atrial fibrillation ([place])    PREMATURE VENTRICULAR CONTRACTIONS, FREQUENT 01/31/2010   Qualifier: Diagnosis of  By: Haroldine Laws, MD, Eileen Stanford    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[facility])    Mother had DM    Typical atrial flutter ([place])    VENTRAL HERNIA 01/08/2008   Qualifier: Diagnosis of  By: Linna Darner MD, [place]      Patient Active Problem List   Diagnosis Date Noted   Actinic keratosis 03/01/2021   Obesity (BMI 30-39.9) 06/02/2019   Typical atrial flutter ([place])     Medicare annual wellness visit, subsequent 01/31/2017   Acute left-sided low back pain 09/03/2016   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/04/2015   History of colonic polyps 07/24/2015   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([place])    Hypotension 01/12/2015   Atrial fibrillation ([place]) 07/13/2013   Obstructive sleep apnea 07/19/2010   PREMATURE VENTRICULAR CONTRACTIONS, FREQUENT 01/31/2010   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/01/2010   Hyperlipidemia 10/20/2008   HYPERTENSION, BENIGN 10/20/2008   CAD, NATIVE VESSEL 10/20/2008   VENTRAL HERNIA 01/08/2008   ERECTILE DYSFUNCTION 07/23/2007    Past Surgical History:  Procedure Laterality Date   [place]  2007 & 2011   CARDIAC CATHETERIZATION N/A 01/13/2015   Procedure: Left Heart Cath and Coronary Angiography;  Surgeon: Peter M Martinique, MD;  Location: [place] CV LAB;  Service: Cardiovascular;  Laterality: N/A;   CARDIOVERSION N/A 09/16/2017   Procedure: CARDIOVERSION;  Surgeon: Dorothy Spark, MD;  Location: [facility] ENDOSCOPY;  Service: Cardiovascular;  Laterality: N/A;   COLONOSCOPY  2002   negative; Dr Olevia Perches   HAND SURGERY  1965   TONSILLECTOMY         Family History  Problem Relation Age of Onset   Diabetes Mother    Heart attack  Father         4 vessel CBAG @ 1   Colon cancer Paternal Uncle    Diabetes Brother    Stroke Neg Hx     Social History   Tobacco Use   Smoking status: Former    Packs/day: 1.00    Types: Cigarettes    Quit date: 09/02/1990    Years since quitting: 30.9   Smokeless tobacco: Never   Tobacco comments:    onset age [age] up to 1 ppd  Vaping Use   Vaping Use: Never used  Substance Use Topics   Alcohol use: No   Drug use: No    Home Medications Prior to Admission medications   Medication Sig Start Date End Date Taking? Authorizing Provider  molnupiravir EUA (LAGEVRIO) 200 mg CAPS capsule Take 4 capsules (800 mg total) by mouth 2 (two) times  daily for 5 days. 08/18/21 08/23/21 Yes Lucrezia Starch, MD  amLODipine (NORVASC) 2.5 MG tablet Take 1 tablet (2.5 mg total) by mouth daily. 10/27/19   Martinique, Peter M, MD  benzonatate (TESSALON) 100 MG capsule Take 1 capsule (100 mg total) by mouth 3 (three) times daily as needed for cough. 08/18/21   Lucrezia Starch, MD  carvedilol (COREG) 12.5 MG tablet TAKE 1 AND 1/2 TABLETS TWICE DAILY 02/15/20   Martinique, Peter M, MD  ELIQUIS 5 MG TABS tablet TAKE 1 TABLET BY MOUTH TWICE A DAY 11/29/19   Martinique, Peter M, MD  EPINEPHrine 0.3 mg/0.3 mL IJ SOAJ injection Inject 0.3 mLs (0.3 mg total) into the muscle once. 02/02/16   Midge Minium, MD  fish oil-omega-3 fatty acids 1000 MG capsule Take 2 g by mouth daily.    [provider]  losartan (COZAAR) 100 MG tablet TAKE 1 TABLET EVERY DAY 10/27/19   Martinique, Peter M, MD  metFORMIN (GLUCOPHAGE) 500 MG tablet TAKE 1 TABLET BY MOUTH 2 (TWO) TIMES DAILY WITH A MEAL. 03/01/20   Midge Minium, MD  Multiple Vitamin (MULTIVITAMIN) tablet Take 1 tablet by mouth daily.    [provider]  nitroGLYCERIN (NITROSTAT) 0.4 MG SL tablet Place 1 tablet (0.4 mg total) under the tongue every 5 (five) minutes as needed. 02/28/20   Martinique, Peter M, MD  Saw Palmetto, Serenoa repens, (SAW PALMETTO PO) Take 1 tablet by mouth as needed (prostate).    [provider]  simvastatin (ZOCOR) 40 MG tablet TAKE 1 TABLET AT BEDTIME 10/27/19   Martinique, Peter M, MD  VITAMIN D PO Take 1 tablet by mouth daily.    [provider]    Allergies    Acetaminophen, Oxycodone-acetaminophen, Tamsulosin, and Flomax [tamsulosin hcl]  Review of Systems   Review of Systems  Constitutional:  Negative for chills, fatigue and fever.  HENT:  Negative for ear pain and sore throat.   Eyes:  Negative for pain and visual disturbance.  Respiratory:  Positive for cough. Negative for shortness of breath.   Cardiovascular:  Negative for chest pain and palpitations.   Gastrointestinal:  Negative for abdominal pain and vomiting.  Genitourinary:  Negative for dysuria and hematuria.  Musculoskeletal:  Negative for arthralgias and back pain.  Skin:  Negative for color change and rash.  Neurological:  Negative for seizures and syncope.  All other systems reviewed and are negative.  Physical Exam Updated Vital Signs BP (!) 163/80 (BP Location: Right Arm)    Pulse 97    Temp 98.5 F (36.9 C) (Oral)    Resp  18    SpO2 100%   Physical Exam Vitals and nursing note reviewed.  Constitutional:      General: He is not in acute distress.    Appearance: He is well-developed.  HENT:     Head: Normocephalic and atraumatic.  Eyes:     Conjunctiva/sclera: Conjunctivae normal.  Cardiovascular:     Rate and Rhythm: Normal rate and regular rhythm.     Heart sounds: No murmur heard. Pulmonary:     Effort: Pulmonary effort is normal. No respiratory distress.     Breath sounds: Normal breath sounds.  Abdominal:     Palpations: Abdomen is soft.     Tenderness: There is no abdominal tenderness.  Musculoskeletal:        General: No swelling or deformity.     Cervical back: Neck supple.  Skin:    General: Skin is warm and dry.     Capillary Refill: Capillary refill takes less than 2 seconds.  Neurological:     General: No focal deficit present.     Mental Status: He is alert.  Psychiatric:        Mood and Affect: Mood normal.    ED Results / Procedures / Treatments   Labs (all labs ordered are listed, but only abnormal results are displayed) Labs Reviewed  RESP PANEL BY RT-PCR (FLU A&B, COVID) ARPGX2 - Abnormal; Notable for the following components:      Result Value   SARS Coronavirus 2 by RT PCR POSITIVE (*)    All other components within normal limits    EKG None  Radiology No results found.  Procedures Procedures   Medications Ordered in ED Medications - No data to display  ED Course  I have reviewed the triage vital signs and the nursing  notes.  Pertinent labs & imaging results that were available during my care of the patient were reviewed by me and considered in my medical decision making (see chart for details).    MDM Rules/Calculators/A&P                         80 year old male presents to ER with concern for cough.  Reported COVID-positive test at home.  COVID test here also positive. Patient is very well-appearing and denies any other symptoms besides a cough.  Discussed with Larey Days D.  Recommends Molnipuir given extensive potential med interactions with paxlovid (eliquis, amlodipine and statin).  Vitals are stable.  Reviewed return precautions and discharged home. Rec recheck with pcp next week.     After the discussed management above, the patient was determined to be safe for discharge.  The patient was in agreement with this plan and all questions regarding their care were answered.  ED return precautions were discussed and the patient will return to the ED with any significant worsening of condition.   Jacob Curry was evaluated in Emergency Department on 08/18/2021 for the symptoms described in the history of present illness. He was evaluated in the context of the global COVID-19 pandemic, which necessitated consideration that the patient might be at risk for infection with the SARS-CoV-2 virus that causes COVID-19. Institutional protocols and algorithms that pertain to the evaluation of patients at risk for COVID-19 are in a state of rapid change based on information released by regulatory bodies including the CDC and federal and state organizations. These policies and algorithms were followed during the patient's care in the ED.     Final Clinical  Impression(s) / ED Diagnoses Final diagnoses:  COVID-19    Rx / DC Orders ED Discharge Orders          Ordered    benzonatate (TESSALON) 100 MG capsule  3 times daily PRN,   Status:  Discontinued        08/18/21 1100    nirmatrelvir/ritonavir EUA  (PAXLOVID) 20 x 150 MG & 10 x 100MG  TABS  2 times daily,   Status:  Discontinued        08/18/21 1100    molnupiravir EUA (LAGEVRIO) 200 mg CAPS capsule  2 times daily        08/18/21 1103    benzonatate (TESSALON) 100 MG capsule  3 times daily PRN        08/18/21 1103             Lucrezia Starch, MD 08/18/21 1115

## 2021-08-18 NOTE — ED Triage Notes (Signed)
Pt reports being kept awake last night with new onset dry cough.  Took covid home test this morning, +.  Denies other symptoms at present.

## 2021-08-18 NOTE — Discharge Instructions (Addendum)
Follow-up with your primary care doctor to discuss your symptoms next week.  May have to be a virtual visit.  Recommend following isolation precautions per CDC guidelines.  Initiated continue isolation for at least 5 days from onset of symptoms and also be fever free and symptoms improving at that time.  If you develop difficulty breathing, chest pain, vomiting, or other new concerning symptom, come back to ER for reassessment.

## 2021-08-25 ENCOUNTER — Other Ambulatory Visit: Payer: Self-pay

## 2021-08-25 ENCOUNTER — Encounter (HOSPITAL_BASED_OUTPATIENT_CLINIC_OR_DEPARTMENT_OTHER): Payer: Self-pay | Admitting: Emergency Medicine

## 2021-08-25 ENCOUNTER — Emergency Department (HOSPITAL_BASED_OUTPATIENT_CLINIC_OR_DEPARTMENT_OTHER): Payer: Medicare Other

## 2021-08-25 ENCOUNTER — Emergency Department (HOSPITAL_BASED_OUTPATIENT_CLINIC_OR_DEPARTMENT_OTHER)
Admission: EM | Admit: 2021-08-25 | Discharge: 2021-08-25 | Disposition: A | Discharge status: Home / Self Care | Payer: Medicare Other | Attending: Emergency Medicine | Admitting: Emergency Medicine

## 2021-08-25 DIAGNOSIS — Z79899 Other long term (current) drug therapy: Secondary | ICD-10-CM | POA: Insufficient documentation

## 2021-08-25 DIAGNOSIS — U071 COVID-19: Secondary | ICD-10-CM | POA: Diagnosis not present

## 2021-08-25 DIAGNOSIS — R059 Cough, unspecified: Secondary | ICD-10-CM | POA: Diagnosis not present

## 2021-08-25 DIAGNOSIS — E119 Type 2 diabetes mellitus without complications: Secondary | ICD-10-CM | POA: Insufficient documentation

## 2021-08-25 DIAGNOSIS — I10 Essential (primary) hypertension: Secondary | ICD-10-CM | POA: Diagnosis not present

## 2021-08-25 DIAGNOSIS — I2510 Atherosclerotic heart disease of native coronary artery without angina pectoris: Secondary | ICD-10-CM | POA: Diagnosis not present

## 2021-08-25 DIAGNOSIS — Z87891 Personal history of nicotine dependence: Secondary | ICD-10-CM | POA: Insufficient documentation

## 2021-08-25 DIAGNOSIS — Z7984 Long term (current) use of oral hypoglycemic drugs: Secondary | ICD-10-CM | POA: Diagnosis not present

## 2021-08-25 DIAGNOSIS — I517 Cardiomegaly: Secondary | ICD-10-CM | POA: Diagnosis not present

## 2021-08-25 MED ORDER — PREDNISONE 10 MG PO TABS: 40.0000 mg | ORAL_TABLET | Freq: Every day | ORAL | 0 refills | Status: AC

## 2021-08-25 NOTE — ED Provider Notes (Signed)
[facility] EMERGENCY DEPT Provider Note   CSN: [id number] Arrival date & time: 08/25/21  9417     History Chief Complaint  Patient presents with   Cough    Jacob Curry is a 80 y.o. male.   Cough  80 year old male with a history of CAD, DM 2, HLD, HTN, obesity, recent diagnosis of COVID-19 who presents emergency department with worsening symptoms of COVID-19.  The patient states that he was diagnosed on 12/17 with COVID by PCR testing.  He endorses a persistent dry cough.  He denies any chest pain or shortness of breath.  He states that he has been having difficulty sleeping due to a persisting cough.  He was seen at the [facility] during which time he was provided with antitussive agents and advised continued outpatient management.  He denies any fevers or chills.  He is tolerating oral intake.  He was ambulatory in the emerged part on arrival with no desaturations.  Past Medical History:  Diagnosis Date   CAD (coronary artery disease)    Dr Peter Martinique   CAD, NATIVE VESSEL 10/20/2008        Carotid stenosis    Complication of anesthesia    " DIFFICULTY WAKING "   DM (diabetes mellitus) ([place])    ED (erectile dysfunction)    ERECTILE DYSFUNCTION 07/23/2007   Qualifier: Diagnosis of  By: Linna Darner MD, Gwyndolyn Saxon     HLD (hyperlipidemia)    HTN (hypertension)    Hyperlipidemia 10/20/2008   Qualifier: Diagnosis of  By: Haroldine Laws, MD, Eileen Stanford    Myocardial infarction ([place]) 2007   Obesity    Obstructive sleep apnea 07/19/2010   NPSG 07/20/10- AHI 20.2/ hr    OSA (obstructive sleep apnea)    oral appliance, Dr Ron Parker   Paroxysmal atrial fibrillation ([place])    PREMATURE VENTRICULAR CONTRACTIONS, FREQUENT 01/31/2010   Qualifier: Diagnosis of  By: Haroldine Laws, MD, Eileen Stanford    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[facility])    Mother had DM    Typical atrial flutter ([place])    VENTRAL HERNIA 01/08/2008   Qualifier: Diagnosis of  By: Linna Darner MD, [place]       Patient Active Problem List   Diagnosis Date Noted   Actinic keratosis 03/01/2021   Obesity (BMI 30-39.9) 06/02/2019   Typical atrial flutter ([place])    Medicare annual wellness visit, subsequent 01/31/2017   Acute left-sided low back pain 09/03/2016   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/04/2015   History of colonic polyps 07/24/2015   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([place])    Hypotension 01/12/2015   Atrial fibrillation ([place]) 07/13/2013   Obstructive sleep apnea 07/19/2010   PREMATURE VENTRICULAR CONTRACTIONS, FREQUENT 01/31/2010   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/01/2010   Hyperlipidemia 10/20/2008   HYPERTENSION, BENIGN 10/20/2008   CAD, NATIVE VESSEL 10/20/2008   VENTRAL HERNIA 01/08/2008   ERECTILE DYSFUNCTION 07/23/2007    Past Surgical History:  Procedure Laterality Date   [place]  2007 & 2011   CARDIAC CATHETERIZATION N/A 01/13/2015   Procedure: Left Heart Cath and Coronary Angiography;  Surgeon: Peter M Martinique, MD;  Location: [place] CV LAB;  Service: Cardiovascular;  Laterality: N/A;   CARDIOVERSION N/A 09/16/2017   Procedure: CARDIOVERSION;  Surgeon: Dorothy Spark, MD;  Location: [place];  Service: Cardiovascular;  Laterality: N/A;   COLONOSCOPY  2002   negative; Dr Olevia Perches   HAND SURGERY  1965   TONSILLECTOMY         Family History  Problem Relation Age of Onset   Diabetes Mother    Heart attack Father         4 vessel CBAG @ 31   Colon cancer Paternal Uncle    Diabetes Brother    Stroke Neg Hx     Social History   Tobacco Use   Smoking status: Former    Packs/day: 1.00    Types: Cigarettes    Quit date: 09/02/1990    Years since quitting: 31.0   Smokeless tobacco: Never   Tobacco comments:    onset age 50-50 up to 1 ppd  Vaping Use   Vaping Use: Never used  Substance Use Topics   Alcohol use: No   Drug use: No    Home Medications Prior to Admission medications   Medication  Sig Start Date End Date Taking? Authorizing Provider  predniSONE (DELTASONE) 10 MG tablet Take 4 tablets (40 mg total) by mouth daily for 5 days. 08/25/21 08/30/21 Yes Regan Lemming, MD  amLODipine (NORVASC) 2.5 MG tablet Take 1 tablet (2.5 mg total) by mouth daily. 10/27/19   Martinique, Peter M, MD  benzonatate (TESSALON) 100 MG capsule Take 1 capsule (100 mg total) by mouth 3 (three) times daily as needed for cough. 08/18/21   Lucrezia Starch, MD  carvedilol (COREG) 12.5 MG tablet TAKE 1 AND 1/2 TABLETS TWICE DAILY 02/15/20   Martinique, Peter M, MD  ELIQUIS 5 MG TABS tablet TAKE 1 TABLET BY MOUTH TWICE A DAY 11/29/19   Martinique, Peter M, MD  EPINEPHrine 0.3 mg/0.3 mL IJ SOAJ injection Inject 0.3 mLs (0.3 mg total) into the muscle once. 02/02/16   Midge Minium, MD  fish oil-omega-3 fatty acids 1000 MG capsule Take 2 g by mouth daily.    [provider]  losartan (COZAAR) 100 MG tablet TAKE 1 TABLET EVERY DAY 10/27/19   Martinique, Peter M, MD  metFORMIN (GLUCOPHAGE) 500 MG tablet TAKE 1 TABLET BY MOUTH 2 (TWO) TIMES DAILY WITH A MEAL. 03/01/20   Midge Minium, MD  Multiple Vitamin (MULTIVITAMIN) tablet Take 1 tablet by mouth daily.    [provider]  nitroGLYCERIN (NITROSTAT) 0.4 MG SL tablet Place 1 tablet (0.4 mg total) under the tongue every 5 (five) minutes as needed. 02/28/20   Martinique, Peter M, MD  Saw Palmetto, Serenoa repens, (SAW PALMETTO PO) Take 1 tablet by mouth as needed (prostate).    [provider]  simvastatin (ZOCOR) 40 MG tablet TAKE 1 TABLET AT BEDTIME 10/27/19   Martinique, Peter M, MD  VITAMIN D PO Take 1 tablet by mouth daily.    [provider]    Allergies    Acetaminophen, Oxycodone-acetaminophen, Tamsulosin, and Flomax [tamsulosin hcl]  Review of Systems   Review of Systems  Respiratory:  Positive for cough.   All other systems reviewed and are negative.  Physical Exam Updated Vital Signs BP (!) 144/71    Pulse 90    Temp 97.8 F  (36.6 C) (Oral)    Resp 18    Ht 5\' 8"  (1.727 m)    Wt 83.9 kg    SpO2 96%    BMI 28.13 kg/m   Physical Exam Vitals and nursing note reviewed.  Constitutional:      General: He is not in acute distress. HENT:     Head: Normocephalic and atraumatic.  Eyes:     Conjunctiva/sclera: Conjunctivae normal.  Pupils: Pupils are equal, round, and reactive to light.  Cardiovascular:     Rate and Rhythm: Normal rate and regular rhythm.  Pulmonary:     Effort: Pulmonary effort is normal. No respiratory distress.     Breath sounds: Normal breath sounds.  Abdominal:     General: There is no distension.     Tenderness: There is no abdominal tenderness. There is no guarding.  Musculoskeletal:        General: No deformity or signs of injury.     Cervical back: Neck supple.  Skin:    Findings: No lesion or rash.  Neurological:     General: No focal deficit present.     Mental Status: He is alert. Mental status is at baseline.    ED Results / Procedures / Treatments   Labs (all labs ordered are listed, but only abnormal results are displayed) Labs Reviewed - No data to display  EKG None  Radiology DG [facility] 1 View  Result Date: 08/25/2021 CLINICAL DATA:  Cough EXAM: PORTABLE CHEST 1 VIEW COMPARISON:  Previous studies including the examination of 11/26/2016 FINDINGS: Transverse diameter of heart is increased. Thoracic aorta is tortuous. There are no signs of pulmonary edema or focal pulmonary consolidation. There is no significant pleural effusion or pneumothorax. IMPRESSION: Cardiomegaly. There are no signs of pulmonary edema or focal pulmonary consolidation. Electronically Signed   By: Elmer Picker M.D.   On: 08/25/2021 09:27    Procedures Procedures   Medications Ordered in ED Medications - No data to display  ED Course  I have reviewed the triage vital signs and the nursing notes.  Pertinent labs & imaging results that were available during my care of the patient  were reviewed by me and considered in my medical decision making (see chart for details).    MDM Rules/Calculators/A&P                           80 year old male with a history of CAD, DM 2, HLD, HTN, obesity, recent diagnosis of COVID-19 who presents emergency department with worsening symptoms of COVID-19.  The patient states that he was diagnosed on 12/17 with COVID by PCR testing.  He endorses a persistent dry cough.  He denies any chest pain or shortness of breath.  He states that he has been having difficulty sleeping due to a persisting cough.  He was seen at the [facility] during which time he was provided with antitussive agents and advised continued outpatient management.  He denies any fevers or chills.  He is tolerating oral intake.  He was ambulatory in the emerged part on arrival with no desaturations.  On arrival, the patient was afebrile, hemodynamically stable, mildly hypertensive BP 164/73 (144/71 on recheck), saturating 99% on room air.  The patient was ambulatory in the emergency department with no significant desaturations, saturating at lowest 94% on room air during ambulation.  Does not have an oxygen requirement or acute hypoxic respiratory failure at this time.  He is tolerating oral intake.  No chest pain or shortness of breath or hypoxia.  Low concern for ACS, myocarditis/pericarditis or acute PE.  He had a chest x-ray which revealed no focal consolidation to suggest a bacterial pneumonia.  Mild cardiomegaly with no evidence of pulmonary edema on chest x-ray.  He denies any chest pain or shortness of breath.  His lungs are clear to auscultation bilaterally.  The patient is already on multiple antitussive agents  at home prescribed previously.  I advised continued management with those medications in addition to a prescription for a steroid burst.  Overall well-appearing, ambulatory, tolerating oral intake, stable for continued outpatient management at this time of his persistent symptoms  of COVID-19.   Final Clinical Impression(s) / ED Diagnoses Final diagnoses:  Cough  COVID-19    Rx / DC Orders ED Discharge Orders          Ordered    predniSONE (DELTASONE) 10 MG tablet  Daily        08/25/21 1000             Regan Lemming, MD 08/25/21 1127

## 2021-08-25 NOTE — ED Notes (Signed)
Pt ambulated in hall. HR 103 94%. Pt stated that he felt fine walking. Pt denis Pain and SOB

## 2021-08-25 NOTE — ED Triage Notes (Signed)
Pt arrives to ED with c/o cough. This started on 12/17 when pt was dx with COVID. Pt reports the cough is dry. Pt states he cannot sleep at night due to cough.

## 2021-08-25 NOTE — Discharge Instructions (Addendum)
You were evaluated in the Emergency Department and after careful evaluation, we did not find any emergent condition requiring admission or further testing in the hospital.  Your exam/testing today was overall reassuring.  Your symptoms are consistent with persistent COVID-19 infection which should begin to resolve by day 10.  Your chest x-ray did not reveal consolidation concerning for pneumonia.  Will prescribe a short burst of prednisone for airway inflammation.  Continue to take the medications prescribed by the [place].  Return for worsening chest pain, shortness of breath, increased respiratory rate, or any other concerns.  Please return to the Emergency Department if you experience any worsening of your condition.  Thank you for allowing [place] to be a part of your care.

## 2021-08-25 NOTE — ED Notes (Signed)
Patient verbalizes understanding of discharge instructions. Opportunity for questioning and answers were provided. Patient discharged from ED.  °

## 2021-08-30 ENCOUNTER — Ambulatory Visit (INDEPENDENT_AMBULATORY_CARE_PROVIDER_SITE_OTHER): Payer: Medicare Other | Admitting: Family Medicine

## 2021-08-30 ENCOUNTER — Encounter: Payer: Self-pay | Admitting: Family Medicine

## 2021-08-30 VITALS — BP 130/68 | HR 74 | Ht 68.0 in | Wt 202.2 lb

## 2021-08-30 DIAGNOSIS — F4321 Adjustment disorder with depressed mood: Secondary | ICD-10-CM

## 2021-08-30 DIAGNOSIS — E1159 Type 2 diabetes mellitus with other circulatory complications: Secondary | ICD-10-CM

## 2021-08-30 DIAGNOSIS — R058 Other specified cough: Secondary | ICD-10-CM | POA: Diagnosis not present

## 2021-08-30 DIAGNOSIS — I25118 Atherosclerotic heart disease of native coronary artery with other forms of angina pectoris: Secondary | ICD-10-CM

## 2021-08-30 DIAGNOSIS — I10 Essential (primary) hypertension: Secondary | ICD-10-CM

## 2021-08-30 DIAGNOSIS — E782 Mixed hyperlipidemia: Secondary | ICD-10-CM

## 2021-08-30 LAB — CBC WITH DIFFERENTIAL/PLATELET
Basophils Absolute: 0.1 10*3/uL (ref 0.0–0.1)
Basophils Relative: 0.8 % (ref 0.0–3.0)
Eosinophils Absolute: 0.1 10*3/uL (ref 0.0–0.7)
Eosinophils Relative: 1.1 % (ref 0.0–5.0)
HCT: 41.9 % (ref 39.0–52.0)
Hemoglobin: 13.9 g/dL (ref 13.0–17.0)
Lymphocytes Relative: 26.8 % (ref 12.0–46.0)
Lymphs Abs: 2.8 10*3/uL (ref 0.7–4.0)
MCHC: 33.2 g/dL (ref 30.0–36.0)
MCV: 89 fl (ref 78.0–100.0)
Monocytes Absolute: 0.9 10*3/uL (ref 0.1–1.0)
Monocytes Relative: 8.7 % (ref 3.0–12.0)
Neutro Abs: 6.6 10*3/uL (ref 1.4–7.7)
Neutrophils Relative %: 62.6 % (ref 43.0–77.0)
Platelets: 264 10*3/uL (ref 150.0–400.0)
RBC: 4.71 Mil/uL (ref 4.22–5.81)
RDW: 13.7 % (ref 11.5–15.5)
WBC: 10.6 10*3/uL — ABNORMAL HIGH (ref 4.0–10.5)

## 2021-08-30 LAB — HEPATIC FUNCTION PANEL
ALT: 23 U/L (ref 0–53)
AST: 23 U/L (ref 0–37)
Albumin: 3.7 g/dL (ref 3.5–5.2)
Alkaline Phosphatase: 67 U/L (ref 39–117)
Bilirubin, Direct: 0.1 mg/dL (ref 0.0–0.3)
Total Bilirubin: 0.8 mg/dL (ref 0.2–1.2)
Total Protein: 6 g/dL (ref 6.0–8.3)

## 2021-08-30 LAB — LIPID PANEL
Cholesterol: 98 mg/dL (ref 0–200)
HDL: 38 mg/dL — ABNORMAL LOW (ref >39.00)
LDL Cholesterol: 24 mg/dL (ref 0–99)
NonHDL: 60.16
Total CHOL/HDL Ratio: 3
Triglycerides: 182 mg/dL — ABNORMAL HIGH (ref 0.0–149.0)
VLDL: 36.4 mg/dL (ref 0.0–40.0)

## 2021-08-30 LAB — BASIC METABOLIC PANEL
BUN: 16 mg/dL (ref 6–23)
CO2: 28 mEq/L (ref 19–32)
Calcium: 8.7 mg/dL (ref 8.4–10.5)
Chloride: 103 mEq/L (ref 96–112)
Creatinine, Ser: 0.96 mg/dL (ref 0.40–1.50)
GFR: 74.57 mL/min (ref >60.00)
Glucose, Bld: 108 mg/dL — ABNORMAL HIGH (ref 70–99)
Potassium: 3.9 mEq/L (ref 3.5–5.1)
Sodium: 142 mEq/L (ref 135–145)

## 2021-08-30 LAB — TSH: TSH: 3.81 u[IU]/mL (ref 0.35–5.50)

## 2021-08-30 LAB — HEMOGLOBIN A1C: Hgb A1c MFr Bld: 7.5 % — ABNORMAL HIGH (ref 4.6–6.5)

## 2021-08-30 MED ORDER — GUAIFENESIN-CODEINE 100-10 MG/5ML PO SYRP
10.0000 mL | ORAL_SOLUTION | Freq: Three times a day (TID) | ORAL | 0 refills | Status: DC | PRN
Start: 2021-08-30 — End: 2022-10-03

## 2021-08-30 NOTE — Patient Instructions (Signed)
Follow up in 6 months to recheck BP, cholesterol, diabetes We'll notify you of your lab results and make any changes if needed USE the cough syrup as needed- may make you drowsy IF the mood doesn't improve- let me know! RESCHEDULE your eye exam and have them send me the results Call with any questions or concerns Hang in there! Happy New Year!!!

## 2021-08-30 NOTE — Progress Notes (Signed)
° °  Subjective:    Patient ID: Jacob Curry, male    DOB: [birth year], 80 y.o.   MRN: [id number]  HPI HTN- chronic problem, on Amlodipine 2.5mg  daily, Coreg 18.75mg  BID, Losartan 100mg  daily w/ good control.  No CP, SOB, HAs, visual changes, edema.  Hyperlipidemia- chronic problem, on Simvastatin 40mg  daily.  Abd pain, N/V.  DM- chronic problem, on Metformin 500mg  BID.  Due for eye exam (was scheduled for last week but this was canceled due to [place].  Needs to reschedule)  foot exam.  On ARB for renal protection.  No numbness/tingling of hands/feet.  No sores on feet.  COVID- pt reports feeling better w/ exception of persistent cough which is worse at night.  Interfering w/ sleep.  Has available inhaler  Depression- today's PHQ=12.  'it's just everything'.  Wife has had back surgery, subsequent infxn, then they both got COVID.   Review of Systems For ROS see HPI   This visit occurred during the SARS-CoV-2 public health emergency.  Safety protocols were in place, including screening questions prior to the visit, additional usage of staff PPE, and extensive cleaning of exam room while observing appropriate contact time as indicated for disinfecting solutions.      Objective:   Physical Exam Vitals reviewed.  Constitutional:      General: He is not in acute distress.    Appearance: Normal appearance. He is well-developed. He is not ill-appearing.  HENT:     Head: Normocephalic and atraumatic.  Eyes:     Extraocular Movements: Extraocular movements intact.     Conjunctiva/sclera: Conjunctivae normal.     Pupils: Pupils are equal, round, and reactive to light.  Neck:     Thyroid: No thyromegaly.  Cardiovascular:     Rate and Rhythm: Normal rate. Rhythm irregular.     Pulses: Normal pulses.     Heart sounds: Normal heart sounds. No murmur heard. Pulmonary:     Effort: Pulmonary effort is normal. No respiratory distress.     Breath sounds: Normal breath sounds.  Abdominal:      General: Bowel sounds are normal. There is no distension.     Palpations: Abdomen is soft.  Musculoskeletal:     Cervical back: Normal range of motion and neck supple.     Right lower leg: No edema.     Left lower leg: No edema.  Lymphadenopathy:     Cervical: No cervical adenopathy.  Skin:    General: Skin is warm and dry.  Neurological:     General: No focal deficit present.     Mental Status: He is alert and oriented to person, place, and time.     Cranial Nerves: No cranial nerve deficit.  Psychiatric:        Mood and Affect: Mood normal.        Behavior: Behavior normal.          Assessment & Plan:   Post-viral cough- new.  Pt reports ongoing cough since COVID.  This is interfering w/ sleep.  Will start codeine cough syrup for symptomatic relief.  Pt expressed understanding and is in agreement w/ plan.   Situational depression- new.  Pt's PHQ=12.  He feels that it's just an accumulation of everything- wife's back surgery, subsequent infection, recent COVID infxn.  He is not interested in medication or counseling and feels that things will improve w/ time.  Will follow.

## 2021-09-05 ENCOUNTER — Telehealth: Payer: Self-pay

## 2021-09-05 NOTE — Telephone Encounter (Signed)
-----   Message from Midge Minium, MD sent at 09/03/2021  9:18 PM EST ----- A1C has increased to 7.5% but this is likely due to holiday eating and stress.  No med changes at this time.  Remainder of labs look good!

## 2021-09-05 NOTE — Telephone Encounter (Signed)
Pt aware of labs  

## 2021-09-16 NOTE — Assessment & Plan Note (Signed)
Chronic problem.  On Simvastatin w/o difficulty.  Check labs.  Adjust meds prn  

## 2021-09-16 NOTE — Assessment & Plan Note (Signed)
Chronic problem.  Well controlled on Amlodipine, Coreg, Losartan.  Currently asymptomatic.  Check labs due to ARB.  No anticipated med changes.

## 2021-09-16 NOTE — Assessment & Plan Note (Signed)
Chronic problem.  On Metformin 500mg  BID w/ hx of good control.  Eye exam to be rescheduled.  On ARB for renal protection.  Foot exam done today.  Currently asymptomatic.  Check labs.  Adjust meds prn

## 2021-09-25 DIAGNOSIS — Z961 Presence of intraocular lens: Secondary | ICD-10-CM | POA: Diagnosis not present

## 2021-09-25 DIAGNOSIS — E119 Type 2 diabetes mellitus without complications: Secondary | ICD-10-CM | POA: Diagnosis not present

## 2021-09-25 LAB — HM DIABETES EYE EXAM

## 2021-11-15 DIAGNOSIS — N401 Enlarged prostate with lower urinary tract symptoms: Secondary | ICD-10-CM | POA: Diagnosis not present

## 2021-11-15 DIAGNOSIS — N529 Male erectile dysfunction, unspecified: Secondary | ICD-10-CM | POA: Diagnosis not present

## 2021-11-15 DIAGNOSIS — N138 Other obstructive and reflux uropathy: Secondary | ICD-10-CM | POA: Diagnosis not present

## 2021-12-21 NOTE — Progress Notes (Signed)
? ?Jacob Curry ?Date of Birth: [birth year] ?Medical Record #[id number] ? ?History of Present Illness: ?Jacob Curry is seen for follow up CAD. He has a known history of coronary disease with cardiac catheterization in November 2007 showing total occlusion of a large left circumflex vessel with good left to left collaterals. There was 40% disease in the left main and origin of LAD. 90% mid diagonal, 80-90% mid RCA and 90% RV marginal branch.  Ejection fraction was 50-55%. Prior to this he had a nuclear stress test- he was able to exercise 8:30 with severe ischemia of the inferolateral wall. He has been managed medically. Repeat cardiac cath in May 2011 and May 2016 showed no significant change. He has a history of hypertension, hyperlipidemia, Pafib,  diabetes. He has a history of moderate obstructive sleep apnea. This is managed with an oral appliance. He was unable to tolerate CPAP therapy.  He is anticoagulated with Eliquis. ? ?In December 2018 he developed Afib. Was evaluated in Afib clinic and underwent successful DCCV on 09/16/17. When last seen in June he was back in Afib but was asymptomatic.  ? ?On follow up today he is doing well.  Is now going to gym at [place] facility daily. No chest pain, dyspnea, palpitations, edema. He did have Covid in December but recovered well from that.  ? ? ?Current Outpatient Medications on File Prior to Visit  ?Medication Sig Dispense Refill  ? albuterol (VENTOLIN HFA) 108 (90 Base) MCG/ACT inhaler INHALE 2 PUFFS BY MOUTH FOUR TIMES A DAY FOR BRONCHOSPASMS    ? amLODipine (NORVASC) 2.5 MG tablet Take 1 tablet (2.5 mg total) by mouth daily. 90 tablet 2  ? benzonatate (TESSALON) 100 MG capsule Take 1 capsule (100 mg total) by mouth 3 (three) times daily as needed for cough. 21 capsule 0  ? carvedilol (COREG) 12.5 MG tablet TAKE 1 AND 1/2 TABLETS TWICE DAILY 180 tablet 3  ? ELIQUIS 5 MG TABS tablet TAKE 1 TABLET BY MOUTH TWICE A DAY 60 tablet 5  ? EPINEPHrine 0.3 mg/0.3 mL IJ SOAJ  injection Inject 0.3 mLs (0.3 mg total) into the muscle once. 1 Device 3  ? fish oil-omega-3 fatty acids 1000 MG capsule Take 2 g by mouth daily.    ? guaiFENesin-codeine (ROBITUSSIN AC) 100-10 MG/5ML syrup Take 10 mLs by mouth 3 (three) times daily as needed for cough. 120 mL 0  ? losartan (COZAAR) 100 MG tablet TAKE 1 TABLET EVERY DAY 90 tablet 2  ? metFORMIN (GLUCOPHAGE) 500 MG tablet TAKE 1 TABLET BY MOUTH 2 (TWO) TIMES DAILY WITH A MEAL. 180 tablet 2  ? Multiple Vitamin (MULTIVITAMIN) tablet Take 1 tablet by mouth daily.    ? nitroGLYCERIN (NITROSTAT) 0.4 MG SL tablet Place 1 tablet (0.4 mg total) under the tongue every 5 (five) minutes as needed. 25 tablet 11  ? Saw Palmetto, Serenoa repens, (SAW PALMETTO PO) Take 1 tablet by mouth as needed (prostate).    ? VITAMIN D PO Take 1 tablet by mouth daily.    ? ?No current facility-administered medications on file prior to visit.  ? ? ?Allergies  ?Allergen Reactions  ? Acetaminophen Other (See Comments)  ?  Drug induced hepatitis  ? Oxycodone-Acetaminophen Other (See Comments)  ?  Drug induced hepatitis  ? Tamsulosin Nausea And Vomiting  ?  incontinence  ? Flomax [Tamsulosin Hcl] Other (See Comments)  ?  incontinence  ? ? ?Past Medical History:  ?Diagnosis Date  ? CAD (coronary artery disease)   ?  Dr Diantha Paxson Martinique  ? CAD, NATIVE VESSEL 10/20/2008  ?     ? Carotid stenosis   ? Complication of anesthesia   ? " DIFFICULTY WAKING "  ? DM (diabetes mellitus) ([place])   ? ED (erectile dysfunction)   ? ERECTILE DYSFUNCTION 07/23/2007  ? Qualifier: Diagnosis of  By: Linna Darner MD, Gwyndolyn Saxon    ? HLD (hyperlipidemia)   ? HTN (hypertension)   ? Hyperlipidemia 10/20/2008  ? Qualifier: Diagnosis of  By: Haroldine Laws, MD, Eileen Stanford   ? Myocardial infarction [facility]) 2007  ? Obesity   ? Obstructive sleep apnea 07/19/2010  ? NPSG 07/20/10- AHI 20.2/ hr   ? OSA (obstructive sleep apnea)   ? oral appliance, Dr Ron Parker  ? Paroxysmal atrial fibrillation ([facility])   ? PREMATURE VENTRICULAR CONTRACTIONS,  FREQUENT 01/31/2010  ? Qualifier: Diagnosis of  By: Haroldine Laws, MD, Eileen Stanford   ? 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([place])   ? Mother had DM   ? Typical atrial flutter ([place])   ? VENTRAL HERNIA 01/08/2008  ? Qualifier: Diagnosis of  By: Linna Darner MD, Gwyndolyn Saxon    ? ? ?Past Surgical History:  ?Procedure Laterality Date  ? [place]  ? APPENDECTOMY  1958  ? CARDIAC CATHETERIZATION  2007 & 2011  ? CARDIAC CATHETERIZATION N/A 01/13/2015  ? Procedure: Left Heart Cath and Coronary Angiography;  Surgeon: Kroy Sprung M Martinique, MD;  Location: [place] [facility] LAB;  Service: Cardiovascular;  Laterality: N/A;  ? CARDIOVERSION N/A 09/16/2017  ? Procedure: CARDIOVERSION;  Surgeon: Dorothy Spark, MD;  Location: [place];  Service: Cardiovascular;  Laterality: N/A;  ? COLONOSCOPY  2002  ? negative; Dr Olevia Perches  ? [place]  ? TONSILLECTOMY    ? ? ?Social History  ? ?Tobacco Use  ?Smoking Status Former  ? Packs/day: 1.00  ? Types: Cigarettes  ? Quit date: 09/02/1990  ? Years since quitting: 31.3  ?Smokeless Tobacco Never  ?Tobacco Comments  ? onset age [age] up to 1 ppd  ? ? ?Social History  ? ?Substance and Sexual Activity  ?Alcohol Use No  ? ? ?Family History  ?Problem Relation Age of Onset  ? Diabetes Mother   ? Heart attack Father   ?      4 vessel CBAG @ 26  ? Colon cancer Paternal Uncle   ? Diabetes Brother   ? Stroke Neg Hx   ? ? ?Review of Systems: ?As noted in history of present illness.  All other systems were reviewed and are negative. ? ?Physical Exam: ?BP 130/68   Pulse 84   Ht '5\' 8"'$  (1.727 m)   Wt 199 lb 9.6 oz (90.5 kg)   SpO2 97%   BMI 30.35 kg/m?  ?GENERAL:  Well appearing obese WM in NAD ?HEENT:  PERRL, EOMI, sclera are clear. Oropharynx is clear. ?NECK:  No jugular venous distention, carotid upstroke brisk and symmetric, no bruits, no thyromegaly or adenopathy ?LUNGS:  Clear to auscultation bilaterally ?CHEST:  Unremarkable ?HEART:  IRRR,  PMI not displaced or sustained,S1 and S2  within normal limits, no S3, no S4: no clicks, no rubs, no murmurs ?ABD:  Soft, nontender. BS +, no masses or bruits. No hepatomegaly, no splenomegaly ?EXT:  2 + pulses throughout, no edema, no cyanosis no clubbing ?SKIN:  Warm and dry.  No rashes ?NEURO:  Alert and oriented x 3. Cranial nerves II through XII intact. ?PSYCH:  Cognitively intact ? ? ?LABORATORY DATA: ?Lab  Results  ?Component Value Date  ? WBC 10.6 (H) 08/30/2021  ? HGB 13.9 08/30/2021  ? HCT 41.9 08/30/2021  ? PLT 264.0 08/30/2021  ? GLUCOSE 108 (H) 08/30/2021  ? CHOL 98 08/30/2021  ? TRIG 182.0 (H) 08/30/2021  ? HDL 38.00 (L) 08/30/2021  ? LDLDIRECT 40.0 02/16/2019  ? [place] 24 08/30/2021  ? ALT 23 08/30/2021  ? AST 23 08/30/2021  ? NA 142 08/30/2021  ? K 3.9 08/30/2021  ? CL 103 08/30/2021  ? CREATININE 0.96 08/30/2021  ? BUN 16 08/30/2021  ? CO2 28 08/30/2021  ? TSH 3.81 08/30/2021  ? PSA 1.70 01/04/2009  ? INR 1.75 (H) 01/12/2015  ? HGBA1C 7.5 (H) 08/30/2021  ? MICROALBUR 0.4 03/09/2012  ? ?Ecg is done today. Afib rate 77. Old Anteroseptal. I have personally reviewed and interpreted this study. ? ? ? ?Assessment / Plan: ?1. 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 Moderate to severe 3 vessel disease. Patient is class 0-1 on medical therapy.   Will continue medical therapy. Follow up in 6 months. ? ?2. Hypertension, BP is well controlled. Continue current therapy ? ?3. Hyperlipidemia well controlled on medication.  QIO96 on Zocor. Dose reduced to 20 mg daily ? ?4. Atrial fibrillation, persistent now. S/p DCCV on 09/16/17. Recurrent as of June 2021.  He is asymptomatic and rate is well controlled. He has a Chadvasc score of 4. Continue anticoagulation with Eliquis 5 mg twice a day.  ? ?5. 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 Last A1c 7.5% but this was post Covid on steroids. focus on weight loss. Follow up with PCP in June.  ? ?I will follow up in 6 months.  ?

## 2021-12-26 ENCOUNTER — Ambulatory Visit (INDEPENDENT_AMBULATORY_CARE_PROVIDER_SITE_OTHER): Payer: Medicare Other | Admitting: Cardiology

## 2021-12-26 ENCOUNTER — Encounter: Payer: Self-pay | Admitting: Cardiology

## 2021-12-26 VITALS — BP 130/68 | HR 84 | Ht 68.0 in | Wt 199.6 lb

## 2021-12-26 DIAGNOSIS — I10 Essential (primary) hypertension: Secondary | ICD-10-CM | POA: Diagnosis not present

## 2021-12-26 DIAGNOSIS — E7800 Pure hypercholesterolemia, unspecified: Secondary | ICD-10-CM | POA: Diagnosis not present

## 2021-12-26 DIAGNOSIS — I25118 Atherosclerotic heart disease of native coronary artery with other forms of angina pectoris: Secondary | ICD-10-CM

## 2021-12-26 DIAGNOSIS — I4819 Other persistent atrial fibrillation: Secondary | ICD-10-CM

## 2021-12-26 MED ORDER — SIMVASTATIN 40 MG PO TABS: 20.0000 mg | ORAL_TABLET | Freq: Every day | ORAL | 2 refills | Status: DC

## 2022-02-24 ENCOUNTER — Telehealth: Payer: Self-pay | Admitting: Internal Medicine

## 2022-02-27 NOTE — Telephone Encounter (Signed)
Patient scheduled with Dr. Annamaria Boots 04/02/2022.

## 2022-02-28 ENCOUNTER — Encounter: Payer: Self-pay | Admitting: Family Medicine

## 2022-02-28 ENCOUNTER — Ambulatory Visit (INDEPENDENT_AMBULATORY_CARE_PROVIDER_SITE_OTHER): Payer: Medicare Other | Admitting: Family Medicine

## 2022-02-28 VITALS — BP 136/82 | HR 80 | Temp 97.2°F | Resp 16 | Ht 68.0 in | Wt 195.1 lb

## 2022-02-28 DIAGNOSIS — E1159 Type 2 diabetes mellitus with other circulatory complications: Secondary | ICD-10-CM

## 2022-02-28 DIAGNOSIS — I25118 Atherosclerotic heart disease of native coronary artery with other forms of angina pectoris: Secondary | ICD-10-CM | POA: Diagnosis not present

## 2022-02-28 DIAGNOSIS — E785 Hyperlipidemia, unspecified: Secondary | ICD-10-CM | POA: Insufficient documentation

## 2022-02-28 DIAGNOSIS — E782 Mixed hyperlipidemia: Secondary | ICD-10-CM | POA: Diagnosis not present

## 2022-02-28 DIAGNOSIS — I10 Essential (primary) hypertension: Secondary | ICD-10-CM | POA: Diagnosis not present

## 2022-02-28 DIAGNOSIS — U071 COVID-19: Secondary | ICD-10-CM | POA: Insufficient documentation

## 2022-02-28 DIAGNOSIS — E119 Type 2 diabetes mellitus without complications: Secondary | ICD-10-CM | POA: Insufficient documentation

## 2022-02-28 LAB — CBC WITH DIFFERENTIAL/PLATELET
Basophils Absolute: 0.2 10*3/uL — ABNORMAL HIGH (ref 0.0–0.1)
Basophils Relative: 3.1 % — ABNORMAL HIGH (ref 0.0–3.0)
Eosinophils Absolute: 0.1 10*3/uL (ref 0.0–0.7)
Eosinophils Relative: 1.8 % (ref 0.0–5.0)
HCT: 44.7 % (ref 39.0–52.0)
Hemoglobin: 14.8 g/dL (ref 13.0–17.0)
Lymphocytes Relative: 28.3 % (ref 12.0–46.0)
Lymphs Abs: 2 10*3/uL (ref 0.7–4.0)
MCHC: 33.1 g/dL (ref 30.0–36.0)
MCV: 91.8 fl (ref 78.0–100.0)
Monocytes Absolute: 0.6 10*3/uL (ref 0.1–1.0)
Monocytes Relative: 8.7 % (ref 3.0–12.0)
Neutro Abs: 4.2 10*3/uL (ref 1.4–7.7)
Neutrophils Relative %: 58.1 % (ref 43.0–77.0)
Platelets: 182 10*3/uL (ref 150.0–400.0)
RBC: 4.87 Mil/uL (ref 4.22–5.81)
RDW: 13.9 % (ref 11.5–15.5)
WBC: 7.2 10*3/uL (ref 4.0–10.5)

## 2022-02-28 LAB — HEPATIC FUNCTION PANEL
ALT: 20 U/L (ref 0–53)
AST: 21 U/L (ref 0–37)
Albumin: 4.4 g/dL (ref 3.5–5.2)
Alkaline Phosphatase: 64 U/L (ref 39–117)
Bilirubin, Direct: 0.2 mg/dL (ref 0.0–0.3)
Total Bilirubin: 0.9 mg/dL (ref 0.2–1.2)
Total Protein: 6.8 g/dL (ref 6.0–8.3)

## 2022-02-28 LAB — LIPID PANEL
Cholesterol: 113 mg/dL (ref 0–200)
HDL: 43.1 mg/dL (ref >39.00)
LDL Cholesterol: 51 mg/dL (ref 0–99)
NonHDL: 69.77
Total CHOL/HDL Ratio: 3
Triglycerides: 94 mg/dL (ref 0.0–149.0)
VLDL: 18.8 mg/dL (ref 0.0–40.0)

## 2022-02-28 LAB — BASIC METABOLIC PANEL
BUN: 15 mg/dL (ref 6–23)
CO2: 29 mEq/L (ref 19–32)
Calcium: 9.4 mg/dL (ref 8.4–10.5)
Chloride: 106 mEq/L (ref 96–112)
Creatinine, Ser: 1.02 mg/dL (ref 0.40–1.50)
GFR: 69.1 mL/min (ref >60.00)
Glucose, Bld: 147 mg/dL — ABNORMAL HIGH (ref 70–99)
Potassium: 4.6 mEq/L (ref 3.5–5.1)
Sodium: 141 mEq/L (ref 135–145)

## 2022-02-28 LAB — TSH: TSH: 2.29 u[IU]/mL (ref 0.35–5.50)

## 2022-02-28 LAB — HEMOGLOBIN A1C: Hgb A1c MFr Bld: 7.2 % — ABNORMAL HIGH (ref 4.6–6.5)

## 2022-02-28 NOTE — Assessment & Plan Note (Signed)
Chronic problem.  Currently on Metformin '500mg'$  BID.  UTD on eye exam, foot exam.  Will get Microalbumin but pt is already on Losartan.  He is down 5 lbs since last visit- applauded his efforts.  Check labs.  Adjust meds prn

## 2022-02-28 NOTE — Assessment & Plan Note (Signed)
Chronic problem.  On Amlodipine 2.'5mg'$  daily, Coreg 18.'75mg'$  BID, and Losartan '100mg'$  daily w/ good control.  Currently asymptomatic.  Check labs due to ARB use but no anticipated med changes.

## 2022-02-28 NOTE — Progress Notes (Signed)
   Subjective:    Patient ID: Jacob Curry, male    DOB: [birth year], 81 y.o.   MRN: [id number]  HPI HTN- chronic problem, on Amlodipine 2.'5mg'$  daily, Coreg 12.'5mg'$  (1.5 tabs) BID, Losartan '100mg'$  daily w/ good control.  No CP, SOB, HAs, visual changes, edema.  DM- chronic problem, Metformin '500mg'$  BID.  UTD on eye exam, foot exam.  Due for microalbumin despite Losartan.  Pt is down 5 lbs since April.  Denies symptomatic lows.  No numbness/tingling of hands/feet.  Hyperlipidemia- chronic problem, on Simvastatin '40mg'$  daily.  No abd pain, N/V.   Review of Systems For ROS see HPI     Objective:   Physical Exam Vitals reviewed.  Constitutional:      General: He is not in acute distress.    Appearance: Normal appearance. He is well-developed. He is obese. He is not ill-appearing.  HENT:     Head: Normocephalic and atraumatic.  Eyes:     Extraocular Movements: Extraocular movements intact.     Conjunctiva/sclera: Conjunctivae normal.     Pupils: Pupils are equal, round, and reactive to light.  Neck:     Thyroid: No thyromegaly.  Cardiovascular:     Rate and Rhythm: Normal rate and regular rhythm.     Pulses: Normal pulses.     Heart sounds: Normal heart sounds. No murmur heard. Pulmonary:     Effort: Pulmonary effort is normal. No respiratory distress.     Breath sounds: Normal breath sounds.  Abdominal:     General: Bowel sounds are normal. There is no distension.     Palpations: Abdomen is soft.  Musculoskeletal:     Cervical back: Normal range of motion and neck supple.     Right lower leg: No edema.     Left lower leg: No edema.  Lymphadenopathy:     Cervical: No cervical adenopathy.  Skin:    General: Skin is warm and dry.  Neurological:     General: No focal deficit present.     Mental Status: He is alert and oriented to person, place, and time.     Cranial Nerves: No cranial nerve deficit.  Psychiatric:        Mood and Affect: Mood normal.        Behavior: Behavior  normal.          Assessment & Plan:

## 2022-02-28 NOTE — Patient Instructions (Signed)
Follow up in 6 months to recheck BP, diabetes, and cholesterol We'll notify you of your lab results and make any changes if needed Keep up the good work on healthy diet and regular exercise- you look great! Call with any questions or concerns Stay Safe!  Stay Healthy! Have a great summer!!!

## 2022-02-28 NOTE — Assessment & Plan Note (Signed)
Chronic problem.  Currently on Simvastatin daily w/o difficulty.  Check labs.  Adjust meds prn

## 2022-03-01 ENCOUNTER — Telehealth: Payer: Self-pay

## 2022-03-01 NOTE — Telephone Encounter (Signed)
-----   Message from Midge Minium, MD sent at 03/01/2022  7:30 AM EDT ----- Labs look great!  No changes at this time

## 2022-03-01 NOTE — Telephone Encounter (Signed)
Informed pt of lab results  

## 2022-03-18 ENCOUNTER — Encounter: Payer: Self-pay | Admitting: Family Medicine

## 2022-04-01 NOTE — Progress Notes (Unsigned)
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 Sleep Consult-Dr Ron Parker. Pt needs new oral appliance. Wears appliance every night for OSA Medical problem list includes P.Atrial fibrillation, CAD, HBP, NPSG 07/20/10- AHI 20.2/ hr [place] 6 He had originally not wanted CPAP because of claustrophobia experienced during CPAP titration for his sleep study.  He describes excellent compliance with CPAP, using it all night every night.  Wife tells him he almost never snores with it.  It is comfortable, he sleeps well and he feels well rested.  He wants to replace the old worn-out device with a new one. ENT surgery-tonsils. He continues cardiac rehab with no recurrence of A. fib.  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 After 5 years it is time to replace old oral appliance for OSA.  He sleeps comfortably with OAP and wife confirms he snores if he doesn't wear it.  Discussed association between OSA and AFib. Discussed alternative treatments. He is quite content to continue OAP.   ROS-see HPI   + = positive Constitutional:    weight loss, night sweats, fevers, chills, fatigue, lassitude. HEENT:    headaches, difficulty swallowing, tooth/dental problems, sore throat,       sneezing, itching, ear ache, nasal congestion, post nasal drip, snoring CV:    chest pain, orthopnea, PND, swelling in lower extremities, anasarca,                                    dizziness, palpitations Resp:   shortness of breath with exertion or at rest.                productive cough,   non-productive cough, coughing up of blood.              change in color of mucus.  wheezing.   Skin:    rash or lesions. GI:  No-   heartburn, indigestion, abdominal pain, nausea, vomiting, diarrhea,                 change in bowel habits, loss of appetite GU: dysuria, change in color of urine, no urgency or frequency.   flank pain. MS:   joint pain, stiffness, decreased range of motion, back pain. Neuro-     nothing unusual Psych:  change in mood or affect.  depression or anxiety.   memory loss.  OBJ- Physical Exam General- Alert, Oriented, Affect-appropriate, Distress- none acute, + overweight Skin- rash-none, lesions- none, excoriation- none Lymphadenopathy- none Head- atraumatic            Eyes- Gross vision intact, PERRLA, conjunctivae and secretions clear            Ears- Hearing, canals-normal            Nose- Clear, no-Septal dev, mucus, polyps, erosion, perforation             Throat- Mallampati IV , mucosa clear , drainage- none, tonsils- atrophic Neck- flexible , trachea midline, no stridor , thyroid nl, carotid no bruit Chest -  symmetrical excursion , unlabored           Heart/CV- RRR , no murmur , no gallop  , no rub, nl s1 s2                           - JVD- none , edema- none, stasis changes- none, varices- none           Lung- clear to P&A, wheeze- none, cough- none , dullness-none, rub- none           Chest wall-  Abd-  Br/ Gen/ Rectal- Not done, not indicated Extrem- cyanosis- none, clubbing, none, atrophy- none, strength- nl Neuro- grossly intact to observation

## 2022-04-02 ENCOUNTER — Encounter: Payer: Self-pay | Admitting: Internal Medicine

## 2022-04-02 ENCOUNTER — Ambulatory Visit (INDEPENDENT_AMBULATORY_CARE_PROVIDER_SITE_OTHER): Payer: Medicare Other | Admitting: Internal Medicine

## 2022-04-02 DIAGNOSIS — I25118 Atherosclerotic heart disease of native coronary artery with other forms of angina pectoris: Secondary | ICD-10-CM

## 2022-04-02 DIAGNOSIS — G4733 Obstructive sleep apnea (adult) (pediatric): Secondary | ICD-10-CM

## 2022-04-02 DIAGNOSIS — I480 Paroxysmal atrial fibrillation: Secondary | ICD-10-CM | POA: Diagnosis not present

## 2022-04-02 NOTE — Assessment & Plan Note (Signed)
He continues to benefit from oral appliance and wife confirms good compliance and symptom control. Plan- recommend replacement and continued use of fitted oral appliance for treatment of OSA

## 2022-04-02 NOTE — Patient Instructions (Signed)
Fine to continue using an oral appliance for your sleep apnea. Glad it helps.

## 2022-04-02 NOTE — Assessment & Plan Note (Signed)
Managed by Cardiology. Discussed association with OSA.

## 2022-04-30 ENCOUNTER — Other Ambulatory Visit: Payer: Self-pay | Admitting: *Deleted

## 2022-04-30 NOTE — Patient Outreach (Signed)
  Care Coordination   Initial Visit Note   04/30/2022 Name: Jacob Curry MRN: [id number] DOB: 1941/07/05  Jacob Curry is a 81 y.o. year old male who sees Tabori, Aundra Millet, MD for primary care. I spoke with  [name] by phone today.  What matters to the patients health and wellness today?  No needs    Goals Addressed               This Visit's Progress     COMPLETED: No needs (pt-stated)        Care Coordination Interventions: Reviewed scheduled/upcoming provider appointments including Pending medical appointments and requested pt contact primary provider to scheduled an AWV for 2023.  Screening for signs and symptoms of depression related to chronic disease state  Assessed social determinant of health barriers          SDOH assessments and interventions completed:  Yes  SDOH Interventions Today    Flowsheet Row Most Recent Value  SDOH Interventions   Food Insecurity Interventions Intervention Not Indicated  Housing Interventions Intervention Not Indicated  Transportation Interventions Intervention Not Indicated        Care Coordination Interventions Activated:  Yes  Care Coordination Interventions:  Yes, provided   Follow up plan: No further intervention required.   Encounter Outcome:  Pt. Visit Completed   Raina Mina, RN Care Management Coordinator [place] Office [phone]

## 2022-04-30 NOTE — Patient Instructions (Signed)
Visit Information  Thank you for taking time to visit with me today. Please don't hesitate to contact me if I can be of assistance to you.   Following are the goals we discussed today:   Goals Addressed               This Visit's Progress     COMPLETED: No needs (pt-stated)        Care Coordination Interventions: Reviewed scheduled/upcoming provider appointments including Pending medical appointments and requested pt contact primary provider to scheduled an AWV for 2023.  Screening for signs and symptoms of depression related to chronic disease state  Assessed social determinant of health barriers           Please call the care guide team at [phone] if you need to cancel or reschedule your appointment.   If you are experiencing a Mental Health or [place] or need someone to talk to, please call the Suicide and Crisis Lifeline: 988  Patient verbalizes understanding of instructions and care plan provided today and agrees to view in [facility]. Active MyChart status and patient understanding of how to access instructions and care plan via MyChart confirmed with patient.     No further follow up required: No needs  Raina Mina, RN Care Management Coordinator [place] Office [phone]

## 2022-05-28 DIAGNOSIS — L570 Actinic keratosis: Secondary | ICD-10-CM | POA: Diagnosis not present

## 2022-05-28 DIAGNOSIS — D225 Melanocytic nevi of trunk: Secondary | ICD-10-CM | POA: Diagnosis not present

## 2022-05-28 DIAGNOSIS — Z1283 Encounter for screening for malignant neoplasm of skin: Secondary | ICD-10-CM | POA: Diagnosis not present

## 2022-05-28 DIAGNOSIS — X32XXXA Exposure to sunlight, initial encounter: Secondary | ICD-10-CM | POA: Diagnosis not present

## 2022-06-24 NOTE — Progress Notes (Unsigned)
[name] Date of Birth: [birth year] Medical Record #[id number]  History of Present Illness: Jacob Curry is seen for follow up CAD. He has a known history of coronary disease with cardiac catheterization in November 2007 showing total occlusion of a large left circumflex vessel with good left to left collaterals. There was 40% disease in the left main and origin of LAD. 90% mid diagonal, 80-90% mid RCA and 90% RV marginal branch.  Ejection fraction was 50-55%. Prior to this he had a nuclear stress test- he was able to exercise 8:30 with severe ischemia of the inferolateral wall. He has been managed medically. Repeat cardiac cath in May 2011 and May 2016 showed no significant change. He has a history of hypertension, hyperlipidemia, Pafib,  diabetes. He has a history of moderate obstructive sleep apnea. This is managed with an oral appliance. He was unable to tolerate CPAP therapy.  He is anticoagulated with Eliquis.  In December 2018 he developed Afib. Was evaluated in Afib clinic and underwent successful DCCV on 09/16/17. When last seen in June he was back in Afib but was asymptomatic.   On follow up today he is doing well.  Is now going to gym at [place] facility daily. Rides a bike up to 12 miles. Feels well. No chest pain, dyspnea, palpitations, edema. He is interested in trying medication for weight loss. His daughter was just diagnosed with stage IV ovarian CA.    Current Outpatient Medications on File Prior to Visit  Medication Sig Dispense Refill   albuterol (VENTOLIN HFA) 108 (90 Base) MCG/ACT inhaler      amLODipine (NORVASC) 2.5 MG tablet Take 1 tablet (2.5 mg total) by mouth daily. 90 tablet 2   carvedilol (COREG) 12.5 MG tablet TAKE 1 AND 1/2 TABLETS TWICE DAILY 180 tablet 3   ELIQUIS 5 MG TABS tablet TAKE 1 TABLET BY MOUTH TWICE A DAY 60 tablet 5   EPINEPHrine 0.3 mg/0.3 mL IJ SOAJ injection Inject 0.3 mLs (0.3 mg total) into the muscle once. 1 Device 3   fish oil-omega-3 fatty  acids 1000 MG capsule Take 2 g by mouth daily.     losartan (COZAAR) 100 MG tablet TAKE 1 TABLET EVERY DAY 90 tablet 2   metFORMIN (GLUCOPHAGE) 500 MG tablet TAKE 1 TABLET BY MOUTH 2 (TWO) TIMES DAILY WITH A MEAL. 180 tablet 2   Multiple Vitamin (MULTIVITAMIN) tablet Take 1 tablet by mouth daily.     nitroGLYCERIN (NITROSTAT) 0.4 MG SL tablet Place 1 tablet (0.4 mg total) under the tongue every 5 (five) minutes as needed. 25 tablet 11   Saw Palmetto, Serenoa repens, (SAW PALMETTO PO) Take 1 tablet by mouth as needed (prostate).     simvastatin (ZOCOR) 20 MG tablet Take 20 mg by mouth daily.     VITAMIN D PO Take 1 tablet by mouth daily.     benzonatate (TESSALON) 100 MG capsule Take 1 capsule (100 mg total) by mouth 3 (three) times daily as needed for cough. 21 capsule 0   guaiFENesin-codeine (ROBITUSSIN AC) 100-10 MG/5ML syrup Take 10 mLs by mouth 3 (three) times daily as needed for cough. 120 mL 0   Omega-3 1000 MG CAPS Take 300 mg by mouth daily.     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     No current facility-administered medications on file prior to visit.    Allergies  Allergen Reactions   Acetaminophen Other (See Comments)    Drug induced  hepatitis   Oxycodone-Acetaminophen Other (See Comments)    Drug induced hepatitis   Tamsulosin Nausea And Vomiting    incontinence   Flomax [Tamsulosin Hcl] Other (See Comments)    incontinence    Past Medical History:  Diagnosis Date   CAD (coronary artery disease)    Dr Jacob Curry   CAD, NATIVE VESSEL 10/20/2008        Carotid stenosis    Complication of anesthesia    " DIFFICULTY WAKING "   DM (diabetes mellitus) ([place])    ED (erectile dysfunction)    ERECTILE DYSFUNCTION 07/23/2007   Qualifier: Diagnosis of  By: Jacob Darner MD, Jacob Curry     HLD (hyperlipidemia)    HTN (hypertension)    Hyperlipidemia 10/20/2008   Qualifier: Diagnosis of  By: Jacob Laws, MD, Jacob Curry    Myocardial infarction ([place])  2007   Obesity    Obstructive sleep apnea 07/19/2010   NPSG 07/20/10- AHI 20.2/ hr    OSA (obstructive sleep apnea)    oral appliance, Dr Jacob Curry   Paroxysmal atrial fibrillation ([place])    PREMATURE VENTRICULAR CONTRACTIONS, FREQUENT 01/31/2010   Qualifier: Diagnosis of  By: Jacob Laws, MD, Jacob Curry    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[facility])    Mother had DM    Typical atrial flutter ([place])    VENTRAL HERNIA 01/08/2008   Qualifier: Diagnosis of  By: Jacob Darner MD, Jacob Curry      Past Surgical History:  Procedure Laterality Date   [place]  2007 & 2011   CARDIAC CATHETERIZATION N/A 01/13/2015   Procedure: Left Heart Cath and Coronary Angiography;  Surgeon: Jacob Nuttall M Martinique, MD;  Location: [place] [facility] LAB;  Service: Cardiovascular;  Laterality: N/A;   CARDIOVERSION N/A 09/16/2017   Procedure: CARDIOVERSION;  Surgeon: Jacob Spark, MD;  Location: [facility] ENDOSCOPY;  Service: Cardiovascular;  Laterality: N/A;   COLONOSCOPY  2002   negative; Dr Jacob Curry   HAND SURGERY  1965   TONSILLECTOMY      Social History   Tobacco Use  Smoking Status Former   Packs/day: 1.00   Types: Cigarettes   Quit date: 09/02/1990   Years since quitting: 31.8  Smokeless Tobacco Never  Tobacco Comments   onset age [age] up to 1 ppd    Social History   Substance and Sexual Activity  Alcohol Use No    Family History  Problem Relation Age of Onset   Diabetes Mother    Heart attack Father         4 vessel CBAG @ 45   Colon cancer Paternal Uncle    Diabetes Brother    Stroke Neg Hx     Review of Systems: As noted in history of present illness.  All other systems were reviewed and are negative.  Physical Exam: BP 126/82 (BP Location: Left Arm, Patient Position: Sitting, Cuff Size: Large)   Pulse 67   Ht '5\' 8"'$  (1.727 m)   Wt 204 lb (92.5 kg)   SpO2 96%   BMI 31.02 kg/m  GENERAL:  Well appearing obese WM in NAD HEENT:   PERRL, EOMI, sclera are clear. Oropharynx is clear. NECK:  No jugular venous distention, carotid upstroke brisk and symmetric, no bruits, no thyromegaly or adenopathy LUNGS:  Clear to auscultation bilaterally CHEST:  Unremarkable HEART:  IRRR,  PMI not displaced or sustained,S1 and S2 within normal limits, no S3, no S4: no  clicks, no rubs, no murmurs ABD:  Soft, nontender. BS +, no masses or bruits. No hepatomegaly, no splenomegaly EXT:  2 + pulses throughout, no edema, no cyanosis no clubbing SKIN:  Warm and dry.  No rashes NEURO:  Alert and oriented x 3. Cranial nerves II through XII intact. PSYCH:  Cognitively intact   LABORATORY DATA: Lab Results  Component Value Date   WBC 7.2 02/28/2022   HGB 14.8 02/28/2022   HCT 44.7 02/28/2022   PLT 182.0 02/28/2022   GLUCOSE 147 (H) 02/28/2022   CHOL 113 02/28/2022   TRIG 94.0 02/28/2022   HDL 43.10 02/28/2022   LDLDIRECT 40.0 02/16/2019   LDLCALC 51 02/28/2022   ALT 20 02/28/2022   AST 21 02/28/2022   NA 141 02/28/2022   K 4.6 02/28/2022   CL 106 02/28/2022   CREATININE 1.02 02/28/2022   BUN 15 02/28/2022   CO2 29 02/28/2022   TSH 2.29 02/28/2022   PSA 1.70 01/04/2009   INR 1.75 (H) 01/12/2015   HGBA1C 7.2 (H) 02/28/2022   MICROALBUR 0.4 03/09/2012   Ecg is not done today.     Assessment / Plan: 1. 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 Moderate to severe 3 vessel disease. Patient is class 0-1 on medical therapy.   Will continue medical therapy. Follow up in 6 months.  2. Hypertension, BP is well controlled. Continue current therapy  3. Hyperlipidemia well controlled on medication.  ESP23 on Zocor. Dose reduced to 20 mg daily. Repeat LDL still at goal 51.   4. Atrial fibrillation, persistent now. S/p DCCV on 09/16/17. Recurrent as of June 2021.  He is asymptomatic and rate is well controlled. He has a Chadvasc score of 4. Continue anticoagulation with Eliquis 5 mg twice a day.   5. DM type  2. On metformin. Last A1c 7.2% . Will discuss with Dr Birdie Riddle options for additional medical therapy to promote weight loss.   I will follow up in 6 months.

## 2022-06-26 ENCOUNTER — Ambulatory Visit (INDEPENDENT_AMBULATORY_CARE_PROVIDER_SITE_OTHER): Payer: Medicare Other | Admitting: Family Medicine

## 2022-06-26 DIAGNOSIS — Z23 Encounter for immunization: Secondary | ICD-10-CM

## 2022-06-26 NOTE — Progress Notes (Signed)
Pt received his high dose flu vaccine today tolerated injection well

## 2022-06-27 ENCOUNTER — Ambulatory Visit: Payer: Medicare Other | Attending: Cardiology | Admitting: Cardiology

## 2022-06-27 ENCOUNTER — Encounter: Payer: Self-pay | Admitting: Cardiology

## 2022-06-27 VITALS — BP 126/82 | HR 67 | Ht 68.0 in | Wt 204.0 lb

## 2022-06-27 DIAGNOSIS — I25118 Atherosclerotic heart disease of native coronary artery with other forms of angina pectoris: Secondary | ICD-10-CM | POA: Diagnosis not present

## 2022-06-27 DIAGNOSIS — I4819 Other persistent atrial fibrillation: Secondary | ICD-10-CM

## 2022-06-27 DIAGNOSIS — E7800 Pure hypercholesterolemia, unspecified: Secondary | ICD-10-CM

## 2022-06-27 DIAGNOSIS — I10 Essential (primary) hypertension: Secondary | ICD-10-CM

## 2022-09-04 ENCOUNTER — Ambulatory Visit: Payer: Medicare Other | Admitting: Family Medicine

## 2022-10-03 ENCOUNTER — Encounter: Payer: Self-pay | Admitting: Family Medicine

## 2022-10-03 ENCOUNTER — Ambulatory Visit (INDEPENDENT_AMBULATORY_CARE_PROVIDER_SITE_OTHER): Payer: Medicare Other | Admitting: Family Medicine

## 2022-10-03 VITALS — BP 120/68 | HR 74 | Temp 97.8°F | Resp 17 | Ht 68.0 in | Wt 199.0 lb

## 2022-10-03 DIAGNOSIS — E782 Mixed hyperlipidemia: Secondary | ICD-10-CM | POA: Diagnosis not present

## 2022-10-03 DIAGNOSIS — E1159 Type 2 diabetes mellitus with other circulatory complications: Secondary | ICD-10-CM | POA: Diagnosis not present

## 2022-10-03 DIAGNOSIS — I10 Essential (primary) hypertension: Secondary | ICD-10-CM

## 2022-10-03 LAB — LIPID PANEL
Cholesterol: 114 mg/dL (ref 0–200)
HDL: 41.8 mg/dL (ref >39.00)
LDL Cholesterol: 41 mg/dL (ref 0–99)
NonHDL: 72.68
Total CHOL/HDL Ratio: 3
Triglycerides: 157 mg/dL — ABNORMAL HIGH (ref 0.0–149.0)
VLDL: 31.4 mg/dL (ref 0.0–40.0)

## 2022-10-03 LAB — BASIC METABOLIC PANEL
BUN: 16 mg/dL (ref 6–23)
CO2: 30 mEq/L (ref 19–32)
Calcium: 9.5 mg/dL (ref 8.4–10.5)
Chloride: 103 mEq/L (ref 96–112)
Creatinine, Ser: 0.96 mg/dL (ref 0.40–1.50)
GFR: 74 mL/min (ref >60.00)
Glucose, Bld: 161 mg/dL — ABNORMAL HIGH (ref 70–99)
Potassium: 4.5 mEq/L (ref 3.5–5.1)
Sodium: 141 mEq/L (ref 135–145)

## 2022-10-03 LAB — HEPATIC FUNCTION PANEL
ALT: 22 U/L (ref 0–53)
AST: 21 U/L (ref 0–37)
Albumin: 4.4 g/dL (ref 3.5–5.2)
Alkaline Phosphatase: 81 U/L (ref 39–117)
Bilirubin, Direct: 0.2 mg/dL (ref 0.0–0.3)
Total Bilirubin: 0.9 mg/dL (ref 0.2–1.2)
Total Protein: 7.2 g/dL (ref 6.0–8.3)

## 2022-10-03 LAB — CBC WITH DIFFERENTIAL/PLATELET
Basophils Absolute: 0.1 10*3/uL (ref 0.0–0.1)
Basophils Relative: 0.7 % (ref 0.0–3.0)
Eosinophils Absolute: 0.2 10*3/uL (ref 0.0–0.7)
Eosinophils Relative: 1.4 % (ref 0.0–5.0)
HCT: 43.9 % (ref 39.0–52.0)
Hemoglobin: 14.8 g/dL (ref 13.0–17.0)
Lymphocytes Relative: 20.2 % (ref 12.0–46.0)
Lymphs Abs: 2.5 10*3/uL (ref 0.7–4.0)
MCHC: 33.6 g/dL (ref 30.0–36.0)
MCV: 90.2 fl (ref 78.0–100.0)
Monocytes Absolute: 1.2 10*3/uL — ABNORMAL HIGH (ref 0.1–1.0)
Monocytes Relative: 9.6 % (ref 3.0–12.0)
Neutro Abs: 8.5 10*3/uL — ABNORMAL HIGH (ref 1.4–7.7)
Neutrophils Relative %: 68.1 % (ref 43.0–77.0)
Platelets: 246 10*3/uL (ref 150.0–400.0)
RBC: 4.87 Mil/uL (ref 4.22–5.81)
RDW: 14.1 % (ref 11.5–15.5)
WBC: 12.6 10*3/uL — ABNORMAL HIGH (ref 4.0–10.5)

## 2022-10-03 LAB — TSH: TSH: 3.01 u[IU]/mL (ref 0.35–5.50)

## 2022-10-03 LAB — MICROALBUMIN / CREATININE URINE RATIO
Creatinine,U: 181.7 mg/dL
Microalb Creat Ratio: 0.6 mg/g (ref 0.0–30.0)
Microalb, Ur: 1.1 mg/dL (ref 0.0–1.9)

## 2022-10-03 LAB — HEMOGLOBIN A1C: Hgb A1c MFr Bld: 8 % — ABNORMAL HIGH (ref 4.6–6.5)

## 2022-10-03 NOTE — Assessment & Plan Note (Signed)
Chronic problem.  Tolerating Simvastatin '20mg'$  daily w/o difficulty.  Check labs.  Adjust meds prn

## 2022-10-03 NOTE — Progress Notes (Signed)
   Subjective:    Patient ID: Jacob Curry, male    DOB: 1940/12/12, 82 y.o.   MRN: [id number]  HPI DM- chronic problem, on Metformin '500mg'$  BID.  Due for foot exam, microalbumin.  UTD on eye exam.  No symptomatic lows.  No numbness/tingling of hands/feet.  No skin changes or lesions on feet.  HTN- chronic problem, on Amlodipine 2.'5mg'$  daily, Coreg 18.'75mg'$  BID, Losartan '100mg'$  daily.  No CP, SOB, HA's, visual changes, edema.  Hyperlipidemia- chronic problem, on Simvastatin '20mg'$  daily.  No abd pain, N/V.   Review of Systems For ROS see HPI     Objective:   Physical Exam Constitutional:      General: He is not in acute distress.    Appearance: Normal appearance. He is well-developed. He is not ill-appearing.  HENT:     Head: Normocephalic and atraumatic.  Eyes:     Extraocular Movements: Extraocular movements intact.     Conjunctiva/sclera: Conjunctivae normal.     Pupils: Pupils are equal, round, and reactive to light.  Neck:     Thyroid: No thyromegaly.  Cardiovascular:     Rate and Rhythm: Normal rate and regular rhythm.     Pulses: Normal pulses.     Heart sounds: Normal heart sounds. No murmur heard. Pulmonary:     Effort: Pulmonary effort is normal. No respiratory distress.     Breath sounds: Normal breath sounds.  Abdominal:     General: Bowel sounds are normal. There is no distension.     Palpations: Abdomen is soft.  Musculoskeletal:     Cervical back: Normal range of motion and neck supple.     Right lower leg: No edema.     Left lower leg: No edema.  Lymphadenopathy:     Cervical: No cervical adenopathy.  Skin:    General: Skin is warm and dry.  Neurological:     General: No focal deficit present.     Mental Status: He is alert and oriented to person, place, and time.     Cranial Nerves: No cranial nerve deficit.  Psychiatric:        Mood and Affect: Mood normal.        Behavior: Behavior normal.           Assessment & Plan:

## 2022-10-03 NOTE — Patient Instructions (Signed)
Follow up in 3-4 months to recheck sugars and our new regimen We'll notify you of your lab results and make any changes if needed Continue to work on healthy diet and regular exercise- you're doing great! Call with any questions or concerns Stay Safe!  Stay Healthy! Hang in there!!!

## 2022-10-03 NOTE — Assessment & Plan Note (Signed)
Chronic problem.  Currently on Metformin '500mg'$  BID w/o difficulty.  Pt and wife are interested in the benefits of new diabetes medication- renal protection, heart protection.  Discussed that we need A1C first and if well controlled, can switch to [place] or [place].  If not well controlled, will need to add medication to his current Metformin.  UTD on eye exam.  Foot exam done today.  Microalbumin ordered.

## 2022-10-03 NOTE — Assessment & Plan Note (Signed)
Chronic problem.  Currently well controlled on Amlodipine 2.'5mg'$  daily, Coreg 18.'75mg'$  BID, and Losartan '100mg'$  daily.  Asymptomatic.  Check labs due to ARB but no anticipated changes.  Will follow.

## 2022-10-04 ENCOUNTER — Other Ambulatory Visit: Payer: Self-pay

## 2022-10-04 ENCOUNTER — Telehealth: Payer: Self-pay | Admitting: Family Medicine

## 2022-10-04 MED ORDER — EMPAGLIFLOZIN 10 MG PO TABS: 10.0000 mg | ORAL_TABLET | Freq: Every day | ORAL | 3 refills | Status: DC

## 2022-10-04 NOTE — Telephone Encounter (Signed)
Caller name: HARVIR PATRY  On DPR?: Yes  Call back number: [phone] (mobile)  Provider they see: Midge Minium, MD  Reason for call: Patient called stating that his medication empagliflozin (JARDIANCE) 10 MG TABS tablet needs to be faxed to the [facility] office of Dr.Roberts. Fax number is [phone]. Patient also stating that [place] would like to know why this patient is taking medication as well.

## 2022-10-04 NOTE — Addendum Note (Signed)
Addended by: Patrcia Dolly on: 10/04/2022 10:27 AM   Modules accepted: Orders

## 2022-10-04 NOTE — Telephone Encounter (Signed)
Pt informed

## 2022-10-04 NOTE — Telephone Encounter (Signed)
Faxed

## 2022-11-12 NOTE — Progress Notes (Signed)
Jacob Curry Date of Birth: [birth year] Medical Record #[id number]  History of Present Illness: Jacob Curry is seen for follow up CAD. He has a known history of coronary disease with cardiac catheterization in November 2007 showing total occlusion of a large left circumflex vessel with good left to left collaterals. There was 40% disease in the left main and origin of LAD. 90% mid diagonal, 80-90% mid RCA and 90% RV marginal branch.  Ejection fraction was 50-55%. Prior to this he had a nuclear stress test- he was able to exercise 8:30 with severe ischemia of the inferolateral wall. He has been managed medically. Repeat cardiac cath in May 2011 and May 2016 showed no significant change. He has a history of hypertension, hyperlipidemia, Pafib,  diabetes. He has a history of moderate obstructive sleep apnea. This is managed with an oral appliance. He was unable to tolerate CPAP therapy.  He is anticoagulated with Eliquis.  In December 2018 he developed Afib. Was evaluated in Afib clinic and underwent successful DCCV on 09/16/17. When last seen in June he was back in Afib but was asymptomatic.   On follow up today he is doing well.  Is now going to gym at [place] facility daily.  Feels well. No chest pain, dyspnea, palpitations, edema. His recent A1c increased to 8% and he is now on Jardiance.  His daughter is being treated for  stage IV ovarian CA and he is under a lot of stress.    Current Outpatient Medications on File Prior to Visit  Medication Sig Dispense Refill   albuterol (VENTOLIN HFA) 108 (90 Base) MCG/ACT inhaler      amLODipine (NORVASC) 2.5 MG tablet Take 1 tablet (2.5 mg total) by mouth daily. 90 tablet 2   carvedilol (COREG) 12.5 MG tablet TAKE 1 AND 1/2 TABLETS TWICE DAILY 180 tablet 3   ELIQUIS 5 MG TABS tablet TAKE 1 TABLET BY MOUTH TWICE A DAY 60 tablet 5   empagliflozin (JARDIANCE) 10 MG TABS tablet Take 1 tablet (10 mg total) by mouth daily before breakfast. 30 tablet 3    EPINEPHrine 0.3 mg/0.3 mL IJ SOAJ injection Inject 0.3 mLs (0.3 mg total) into the muscle once. 1 Device 3   fish oil-omega-3 fatty acids 1000 MG capsule Take 2 g by mouth daily.     losartan (COZAAR) 100 MG tablet TAKE 1 TABLET EVERY DAY 90 tablet 2   metFORMIN (GLUCOPHAGE) 500 MG tablet TAKE 1 TABLET BY MOUTH 2 (TWO) TIMES DAILY WITH A MEAL. 180 tablet 2   Multiple Vitamin (MULTIVITAMIN) tablet Take 1 tablet by mouth daily.     nitroGLYCERIN (NITROSTAT) 0.4 MG SL tablet Place 1 tablet (0.4 mg total) under the tongue every 5 (five) minutes as needed. 25 tablet 11   Omega-3 1000 MG CAPS Take 300 mg by mouth daily.     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     Saw Palmetto, Serenoa repens, (SAW PALMETTO PO) Take 1 tablet by mouth as needed (prostate).     simvastatin (ZOCOR) 20 MG tablet Take 20 mg by mouth daily.     VITAMIN D PO Take 1 tablet by mouth daily.     No current facility-administered medications on file prior to visit.    Allergies  Allergen Reactions   Acetaminophen Other (See Comments)    Drug induced hepatitis   Oxycodone-Acetaminophen Other (See Comments)    Drug induced hepatitis   Tamsulosin Nausea And Vomiting  incontinence   Flomax [Tamsulosin Hcl] Other (See Comments)    incontinence    Past Medical History:  Diagnosis Date   CAD (coronary artery disease)    Dr Amonda Brillhart [place]   CAD, NATIVE VESSEL 10/20/2008        Carotid stenosis    Complication of anesthesia    " DIFFICULTY WAKING "   DM (diabetes mellitus) (HCC)    ED (erectile dysfunction)    ERECTILE DYSFUNCTION 07/23/2007   Qualifier: Diagnosis of  By: Alwyn Ren MD, Chrissie Noa     HLD (hyperlipidemia)    HTN (hypertension)    Hyperlipidemia 10/20/2008   Qualifier: Diagnosis of  By: Gala Romney, MD, Trixie Dredge    Myocardial infarction (HCC) 2007   Obesity    Obstructive sleep apnea 07/19/2010   NPSG 07/20/10- AHI 20.2/ hr    OSA (obstructive sleep apnea)    oral  appliance, Dr Myrtis Ser   Paroxysmal atrial fibrillation (HCC)    PREMATURE VENTRICULAR CONTRACTIONS, FREQUENT 01/31/2010   Qualifier: Diagnosis of  By: Gala Romney, MD, Trixie Dredge    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[facility])    Mother had DM    Typical atrial flutter (HCC)    VENTRAL HERNIA 01/08/2008   Qualifier: Diagnosis of  By: Alwyn Ren MD, Chrissie Noa      Past Surgical History:  Procedure Laterality Date   ANKLE FRACTURE SURGERY  1993   APPENDECTOMY  1958   CARDIAC CATHETERIZATION  2007 & 2011   CARDIAC CATHETERIZATION N/A 01/13/2015   Procedure: Left Heart Cath and Coronary Angiography;  Surgeon: Jermiya Reichl M [place], MD;  Location: [facility] INVASIVE CV LAB;  Service: Cardiovascular;  Laterality: N/A;   CARDIOVERSION N/A 09/16/2017   Procedure: CARDIOVERSION;  Surgeon: Lars Masson, MD;  Location: [facility] ENDOSCOPY;  Service: Cardiovascular;  Laterality: N/A;   COLONOSCOPY  2002   negative; Dr Juanda Chance   HAND SURGERY  1965   TONSILLECTOMY      Social History   Tobacco Use  Smoking Status Former   Packs/day: 1.00   Types: Cigarettes   Quit date: 09/02/1990   Years since quitting: 32.2  Smokeless Tobacco Never  Tobacco Comments   onset age 1-50 up to 1 ppd    Social History   Substance and Sexual Activity  Alcohol Use No    Family History  Problem Relation Age of Onset   Diabetes Mother    Heart attack Father         4 vessel CBAG @ 76   Colon cancer Paternal Uncle    Diabetes Brother    Stroke Neg Hx     Review of Systems: As noted in history of present illness.  All other systems were reviewed and are negative.  Physical Exam: BP 126/70   Pulse 74   Ht 5\' 8"  (1.727 m)   Wt 203 lb (92.1 kg)   SpO2 96%   BMI 30.87 kg/m  GENERAL:  Well appearing overweight WM in NAD HEENT:  PERRL, EOMI, sclera are clear. Oropharynx is clear. NECK:  No jugular venous distention, carotid upstroke brisk and symmetric, no bruits, no thyromegaly or adenopathy LUNGS:  Clear to  auscultation bilaterally CHEST:  Unremarkable HEART:  IRRR,  PMI not displaced or sustained,S1 and S2 within normal limits, no S3, no S4: no clicks, no rubs, no murmurs ABD:  Soft, nontender. BS +, no masses or bruits. No hepatomegaly, no splenomegaly EXT:  2 + pulses throughout, no edema, no cyanosis no clubbing SKIN:  Warm and dry.  No rashes NEURO:  Alert and oriented x 3. Cranial nerves II through XII intact. PSYCH:  Cognitively intact   LABORATORY DATA: Lab Results  Component Value Date   WBC 12.6 (H) 10/03/2022   HGB 14.8 10/03/2022   HCT 43.9 10/03/2022   PLT 246.0 10/03/2022   GLUCOSE 161 (H) 10/03/2022   CHOL 114 10/03/2022   TRIG 157.0 (H) 10/03/2022   HDL 41.80 10/03/2022   LDLDIRECT 40.0 02/16/2019   LDLCALC 41 10/03/2022   ALT 22 10/03/2022   AST 21 10/03/2022   NA 141 10/03/2022   K 4.5 10/03/2022   CL 103 10/03/2022   CREATININE 0.96 10/03/2022   BUN 16 10/03/2022   CO2 30 10/03/2022   TSH 3.01 10/03/2022   PSA 1.70 01/04/2009   INR 1.75 (H) 01/12/2015   HGBA1C 8.0 (H) 10/03/2022   MICROALBUR 1.1 10/03/2022   Ecg is  done today. Afib rate 74. Old anterior infarct . I have personally reviewed and interpreted this study.     Assessment / Plan: 1. 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 Moderate to severe 3 vessel disease. Patient has class 0-1 angina on medical therapy.   Will continue medical therapy. Continue lifestyle modification. Follow up in 6 months.  2. Hypertension, BP is well controlled. Continue current therapy  3. Hyperlipidemia well controlled on medication.  LDL41 on Zocor.   4. Atrial fibrillation, persistent now. S/p DCCV on 09/16/17. Recurrent as of June 2021.  He is asymptomatic and rate is well controlled. He has a Chadvasc score of 4. Continue anticoagulation with Eliquis 5 mg twice a day.   5. 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 Last A1c 8% . Recently added Jardiance   I will follow up in 6 months.

## 2022-11-13 ENCOUNTER — Encounter: Payer: Self-pay | Admitting: Cardiology

## 2022-11-13 ENCOUNTER — Ambulatory Visit: Payer: Medicare Other | Attending: Cardiology | Admitting: Cardiology

## 2022-11-13 VITALS — BP 126/70 | HR 74 | Ht 68.0 in | Wt 203.0 lb

## 2022-11-13 DIAGNOSIS — E7800 Pure hypercholesterolemia, unspecified: Secondary | ICD-10-CM

## 2022-11-13 DIAGNOSIS — I10 Essential (primary) hypertension: Secondary | ICD-10-CM | POA: Diagnosis not present

## 2022-11-13 DIAGNOSIS — I4819 Other persistent atrial fibrillation: Secondary | ICD-10-CM | POA: Diagnosis not present

## 2022-11-13 DIAGNOSIS — I25118 Atherosclerotic heart disease of native coronary artery with other forms of angina pectoris: Secondary | ICD-10-CM | POA: Diagnosis not present

## 2022-11-26 DIAGNOSIS — Z961 Presence of intraocular lens: Secondary | ICD-10-CM | POA: Diagnosis not present

## 2022-11-26 DIAGNOSIS — E119 Type 2 diabetes mellitus without complications: Secondary | ICD-10-CM | POA: Diagnosis not present

## 2022-11-26 DIAGNOSIS — Z7984 Long term (current) use of oral hypoglycemic drugs: Secondary | ICD-10-CM | POA: Diagnosis not present

## 2022-11-26 LAB — HM DIABETES EYE EXAM

## 2022-12-19 DIAGNOSIS — N529 Male erectile dysfunction, unspecified: Secondary | ICD-10-CM | POA: Diagnosis not present

## 2022-12-19 DIAGNOSIS — N138 Other obstructive and reflux uropathy: Secondary | ICD-10-CM | POA: Diagnosis not present

## 2022-12-19 DIAGNOSIS — N401 Enlarged prostate with lower urinary tract symptoms: Secondary | ICD-10-CM | POA: Diagnosis not present

## 2023-01-01 ENCOUNTER — Ambulatory Visit: Payer: Medicare Other | Admitting: Family Medicine

## 2023-01-06 ENCOUNTER — Ambulatory Visit (INDEPENDENT_AMBULATORY_CARE_PROVIDER_SITE_OTHER): Payer: Medicare Other | Admitting: Family Medicine

## 2023-01-06 ENCOUNTER — Encounter: Payer: Self-pay | Admitting: Family Medicine

## 2023-01-06 VITALS — BP 118/62 | HR 55 | Temp 97.9°F | Resp 17 | Ht 68.0 in | Wt 194.1 lb

## 2023-01-06 DIAGNOSIS — Z7984 Long term (current) use of oral hypoglycemic drugs: Secondary | ICD-10-CM | POA: Diagnosis not present

## 2023-01-06 DIAGNOSIS — E1159 Type 2 diabetes mellitus with other circulatory complications: Secondary | ICD-10-CM | POA: Diagnosis not present

## 2023-01-06 LAB — BASIC METABOLIC PANEL
BUN: 17 mg/dL (ref 6–23)
CO2: 27 mEq/L (ref 19–32)
Calcium: 9.3 mg/dL (ref 8.4–10.5)
Chloride: 105 mEq/L (ref 96–112)
Creatinine, Ser: 1.07 mg/dL (ref 0.40–1.50)
GFR: 64.85 mL/min (ref >60.00)
Glucose, Bld: 125 mg/dL — ABNORMAL HIGH (ref 70–99)
Potassium: 4.2 mEq/L (ref 3.5–5.1)
Sodium: 142 mEq/L (ref 135–145)

## 2023-01-06 LAB — HEMOGLOBIN A1C: Hgb A1c MFr Bld: 7.3 % — ABNORMAL HIGH (ref 4.6–6.5)

## 2023-01-06 NOTE — Patient Instructions (Signed)
Follow up in 3-4 months to recheck sugar, BP, and cholesterol We'll notify you of your lab results and make any changes if needed Call with any questions or concerns Stay Safe!  Stay Healthy! HAPPY BELATED BIRTHDAY!!!

## 2023-01-06 NOTE — Assessment & Plan Note (Signed)
Pt is down 10 lbs since starting Jardiance at last visit when A1C was 8%.  Also on Metformin 500mg  BID.  UTD on foot exam, eye exam, microalbumin.  Check labs.  Adjust meds prn

## 2023-01-06 NOTE — Progress Notes (Signed)
   Subjective:    Patient ID: Jacob Curry, male    DOB: [birth year], 82 y.o.   MRN: [id number]  HPI DM- chronic problem.  Last A1C 8%  Currently on Metformin 500mg  BID, Jardiance 12.5mg  daily (1/2 tab of 25mg ).  UTD on foot exam, eye exam, microalbumin.  Pt is down 10 lbs.  Pt reports feeling 'fine'.  No CP, SOB, HA's, visual changes, abd pain, N/V.  No symptomatic lows.  No numbness/tingling of hands/feet.   Review of Systems For ROS see HPI     Objective:   Physical Exam Constitutional:      General: He is not in acute distress.    Appearance: Normal appearance. He is well-developed. He is obese. He is not ill-appearing.  HENT:     Head: Normocephalic and atraumatic.  Eyes:     Extraocular Movements: Extraocular movements intact.     Conjunctiva/sclera: Conjunctivae normal.     Pupils: Pupils are equal, round, and reactive to light.  Neck:     Thyroid: No thyromegaly.  Cardiovascular:     Rate and Rhythm: Normal rate and regular rhythm.     Pulses: Normal pulses.     Heart sounds: Normal heart sounds. No murmur heard. Pulmonary:     Effort: Pulmonary effort is normal. No respiratory distress.     Breath sounds: Normal breath sounds.  Abdominal:     General: Bowel sounds are normal. There is no distension.     Palpations: Abdomen is soft.  Musculoskeletal:     Cervical back: Normal range of motion and neck supple.     Right lower leg: No edema.     Left lower leg: No edema.  Lymphadenopathy:     Cervical: No cervical adenopathy.  Skin:    General: Skin is warm and dry.  Neurological:     General: No focal deficit present.     Mental Status: He is alert and oriented to person, place, and time.     Cranial Nerves: No cranial nerve deficit.  Psychiatric:        Mood and Affect: Mood normal.        Behavior: Behavior normal.           Assessment & Plan:

## 2023-01-07 ENCOUNTER — Telehealth: Payer: Self-pay

## 2023-01-07 NOTE — Telephone Encounter (Signed)
Pt seen results Via my chart  

## 2023-01-07 NOTE — Telephone Encounter (Signed)
-----   Message from Sheliah Hatch, MD sent at 01/07/2023  7:08 AM EDT ----- A1C looks much better!  No changes at this time.  Keep up the good work!

## 2023-01-13 ENCOUNTER — Telehealth: Payer: Self-pay | Admitting: Cardiology

## 2023-01-13 NOTE — Telephone Encounter (Signed)
   Pre-operative Risk Assessment    Patient Name: Jacob Curry  DOB: [birth year] MRN: [id number]     Request for Surgical Clearance    Procedure:  Dental Extraction - Amount of Teeth to be Pulled:  1 tooth  Date of Surgery:  Clearance 01/13/23                                 Surgeon:  Dr. Elder Cyphers  Surgeon's Group or Practice Name:  Dr. Aundra Dubin office  Phone number:  [phone] Fax number:  [phone]   Type of Clearance Requested:   - Pharmacy:  Hold [facility]ixaban (Eliquis)     Type of Anesthesia:  Local    Additional requests/questions:    Signed, April Henson   01/13/2023, 2:08 PM

## 2023-01-13 NOTE — Telephone Encounter (Signed)
    Primary Cardiologist: None  Chart reviewed as part of pre-operative protocol coverage. Simple dental extractions are considered low risk procedures per guidelines and generally do not require any specific cardiac clearance. It is also generally accepted that for simple extractions and dental cleanings, there is no need to interrupt blood thinner therapy.   SBE prophylaxis is not required for the patient.  I will route this recommendation to the requesting party via Epic fax function and remove from pre-op pool.  Please call with questions.  Sharlene Dory, PA-C 01/13/2023, 2:18 PM

## 2023-01-22 DIAGNOSIS — Z1283 Encounter for screening for malignant neoplasm of skin: Secondary | ICD-10-CM | POA: Diagnosis not present

## 2023-01-22 DIAGNOSIS — L820 Inflamed seborrheic keratosis: Secondary | ICD-10-CM | POA: Diagnosis not present

## 2023-01-22 DIAGNOSIS — D225 Melanocytic nevi of trunk: Secondary | ICD-10-CM | POA: Diagnosis not present

## 2023-01-22 DIAGNOSIS — L570 Actinic keratosis: Secondary | ICD-10-CM | POA: Diagnosis not present

## 2023-01-22 DIAGNOSIS — X32XXXD Exposure to sunlight, subsequent encounter: Secondary | ICD-10-CM | POA: Diagnosis not present

## 2023-02-28 ENCOUNTER — Ambulatory Visit: Payer: Medicare Other | Admitting: Cardiology

## 2023-04-02 ENCOUNTER — Encounter (INDEPENDENT_AMBULATORY_CARE_PROVIDER_SITE_OTHER): Payer: Self-pay

## 2023-04-23 ENCOUNTER — Encounter: Payer: Self-pay | Admitting: Family Medicine

## 2023-04-23 ENCOUNTER — Ambulatory Visit (INDEPENDENT_AMBULATORY_CARE_PROVIDER_SITE_OTHER): Payer: Medicare Other | Admitting: Family Medicine

## 2023-04-23 VITALS — BP 118/70 | HR 72 | Temp 98.0°F | Resp 17 | Ht 68.0 in | Wt 191.4 lb

## 2023-04-23 DIAGNOSIS — E782 Mixed hyperlipidemia: Secondary | ICD-10-CM | POA: Diagnosis not present

## 2023-04-23 DIAGNOSIS — Z7984 Long term (current) use of oral hypoglycemic drugs: Secondary | ICD-10-CM

## 2023-04-23 DIAGNOSIS — E1159 Type 2 diabetes mellitus with other circulatory complications: Secondary | ICD-10-CM

## 2023-04-23 DIAGNOSIS — I10 Essential (primary) hypertension: Secondary | ICD-10-CM

## 2023-04-23 LAB — LIPID PANEL
Cholesterol: 110 mg/dL (ref 0–200)
HDL: 37.8 mg/dL — ABNORMAL LOW (ref >39.00)
LDL Cholesterol: 48 mg/dL (ref 0–99)
NonHDL: 72.15
Total CHOL/HDL Ratio: 3
Triglycerides: 121 mg/dL (ref 0.0–149.0)
VLDL: 24.2 mg/dL (ref 0.0–40.0)

## 2023-04-23 LAB — BASIC METABOLIC PANEL
BUN: 17 mg/dL (ref 6–23)
CO2: 28 meq/L (ref 19–32)
Calcium: 9.1 mg/dL (ref 8.4–10.5)
Chloride: 105 mEq/L (ref 96–112)
Creatinine, Ser: 0.95 mg/dL (ref 0.40–1.50)
GFR: 74.65 mL/min (ref >60.00)
Glucose, Bld: 133 mg/dL — ABNORMAL HIGH (ref 70–99)
Potassium: 4.1 meq/L (ref 3.5–5.1)
Sodium: 142 meq/L (ref 135–145)

## 2023-04-23 LAB — CBC WITH DIFFERENTIAL/PLATELET
Basophils Absolute: 0.1 10*3/uL (ref 0.0–0.1)
Basophils Relative: 0.7 % (ref 0.0–3.0)
Eosinophils Absolute: 0.1 10*3/uL (ref 0.0–0.7)
Eosinophils Relative: 1.8 % (ref 0.0–5.0)
HCT: 47 % (ref 39.0–52.0)
Hemoglobin: 15.6 g/dL (ref 13.0–17.0)
Lymphocytes Relative: 24 % (ref 12.0–46.0)
Lymphs Abs: 1.9 10*3/uL (ref 0.7–4.0)
MCHC: 33.2 g/dL (ref 30.0–36.0)
MCV: 91 fl (ref 78.0–100.0)
Monocytes Absolute: 0.7 10*3/uL (ref 0.1–1.0)
Monocytes Relative: 9.6 % (ref 3.0–12.0)
Neutro Abs: 4.9 10*3/uL (ref 1.4–7.7)
Neutrophils Relative %: 63.9 % (ref 43.0–77.0)
Platelets: 177 10*3/uL (ref 150.0–400.0)
RBC: 5.17 Mil/uL (ref 4.22–5.81)
RDW: 14.5 % (ref 11.5–15.5)
WBC: 7.7 10*3/uL (ref 4.0–10.5)

## 2023-04-23 LAB — HEPATIC FUNCTION PANEL
ALT: 18 U/L (ref 0–53)
AST: 19 U/L (ref 0–37)
Albumin: 4.2 g/dL (ref 3.5–5.2)
Alkaline Phosphatase: 66 U/L (ref 39–117)
Bilirubin, Direct: 0.2 mg/dL (ref 0.0–0.3)
Total Bilirubin: 1.1 mg/dL (ref 0.2–1.2)
Total Protein: 6.6 g/dL (ref 6.0–8.3)

## 2023-04-23 LAB — HEMOGLOBIN A1C: Hgb A1c MFr Bld: 7 % — ABNORMAL HIGH (ref 4.6–6.5)

## 2023-04-23 NOTE — Assessment & Plan Note (Signed)
Chronic problem.  Well controlled on Amlodipine 2.5mg  daily, Coreg 18.75mg  BID, and Losartan 100mg  daily.  Currently asymptomatic.  Check labs due to ARB but no anticipated med changes.  Will follow.

## 2023-04-23 NOTE — Assessment & Plan Note (Signed)
Chronic problem.  On Simvastatin 20mg daily w/o difficulty.  Check labs.  Adjust meds prn  

## 2023-04-23 NOTE — Patient Instructions (Signed)
Follow up in 3-4 months to recheck diabetes We'll notify you of your lab results and make any changes if needed Continue to work on healthy diet and regular exercise- you're doing great!!! Call with any questions or concerns Stay Safe!  Stay Healthy! Happy Labor Day!!!

## 2023-04-23 NOTE — Progress Notes (Signed)
   Subjective:    Patient ID: MONG FUSON, male    DOB: Mar 21, 1941, 82 y.o.   MRN: [id number]  HPI HTN- chronic problem, on Amlodipine 2.5mg  daily, Coreg 18.75mg  BID, and Losartan 100mg  daily.  No CP, SOB, HA's, visual changes, edema.  DM- chronic problem, on Metformin 500mg  BID, Jardiance 10mg  daily.  Last A1C 7.3%  UTD on eye exam, foot exam, microalbumin.  Exercising regularly.  No symptomatic lows.  No numbness/tingling of hands/feet.  Hyperlipidemia- chronic problem, on Simvastatin 20mg  daily.  No abd pain, N/V.   Review of Systems For ROS see HPI     Objective:   Physical Exam Vitals reviewed.  Constitutional:      General: He is not in acute distress.    Appearance: Normal appearance. He is well-developed. He is not ill-appearing.  HENT:     Head: Normocephalic and atraumatic.  Eyes:     Extraocular Movements: Extraocular movements intact.     Conjunctiva/sclera: Conjunctivae normal.     Pupils: Pupils are equal, round, and reactive to light.  Neck:     Thyroid: No thyromegaly.  Cardiovascular:     Rate and Rhythm: Normal rate and regular rhythm.     Pulses: Normal pulses.     Heart sounds: Normal heart sounds. No murmur heard. Pulmonary:     Effort: Pulmonary effort is normal. No respiratory distress.     Breath sounds: Normal breath sounds.  Abdominal:     General: Bowel sounds are normal. There is no distension.     Palpations: Abdomen is soft.  Musculoskeletal:     Cervical back: Normal range of motion and neck supple.     Right lower leg: No edema.     Left lower leg: No edema.  Lymphadenopathy:     Cervical: No cervical adenopathy.  Skin:    General: Skin is warm and dry.  Neurological:     General: No focal deficit present.     Mental Status: He is alert and oriented to person, place, and time.     Cranial Nerves: No cranial nerve deficit.  Psychiatric:        Mood and Affect: Mood normal.        Behavior: Behavior normal.            Assessment & Plan:

## 2023-04-23 NOTE — Assessment & Plan Note (Signed)
Chronic problem.  On Metformin 500mg  BID and Jardiance 10mg  daily.  UTD on foot exam, eye exam, microalbumin.  Currently asymptomatic.  Tolerating medication w/o difficulty.  Check labs.  Adjust meds prn

## 2023-04-25 LAB — TSH: TSH: 3.2 u[IU]/mL (ref 0.35–5.50)

## 2023-04-28 ENCOUNTER — Telehealth: Payer: Self-pay

## 2023-04-28 NOTE — Telephone Encounter (Signed)
-----   Message from Neena Rhymes sent at 04/28/2023  4:14 PM EDT ----- Labs look great!  No changes at this time

## 2023-04-28 NOTE — Telephone Encounter (Signed)
Pt is aware of lab results.

## 2023-05-14 NOTE — Progress Notes (Unsigned)
Jacob Curry Date of Birth: Aug 15, 1941 Medical Record #[id number]  History of Present Illness: Jacob Curry Stable is seen for follow up CAD. He has a known history of coronary disease with cardiac catheterization in November 2007 showing total occlusion of a large left circumflex vessel with good left to left collaterals. There was 40% disease in the left main and origin of LAD. 90% mid diagonal, 80-90% mid RCA and 90% RV marginal branch.  Ejection fraction was 50-55%. Prior to this he had a nuclear stress test- he was able to exercise 8:30 with severe ischemia of the inferolateral wall. He has been managed medically. Repeat cardiac cath in May 2011 and May 2016 showed no significant change. He has a history of hypertension, hyperlipidemia, Pafib,  diabetes. He has a history of moderate obstructive sleep apnea. This is managed with an oral appliance. He was unable to tolerate CPAP therapy.  He is anticoagulated with Eliquis.  In December 2018 he developed Afib. Was evaluated in Afib clinic and underwent successful DCCV on 09/16/17. When last seen in June he was back in Afib but was asymptomatic.   On follow up today he is doing well.  Is now going to gym at [place] facility daily.  Feels well. No chest pain, dyspnea, palpitations, edema. His recent A1c increased to 8% and he is now on Jardiance.  His daughter is being treated for  stage IV ovarian CA and he is under a lot of stress.    Current Outpatient Medications on File Prior to Visit  Medication Sig Dispense Refill   albuterol (VENTOLIN HFA) 108 (90 Base) MCG/ACT inhaler      amLODipine (NORVASC) 2.5 MG tablet Take 1 tablet (2.5 mg total) by mouth daily. 90 tablet 2   carvedilol (COREG) 12.5 MG tablet TAKE 1 AND 1/2 TABLETS TWICE DAILY 180 tablet 3   ELIQUIS 5 MG TABS tablet TAKE 1 TABLET BY MOUTH TWICE A DAY 60 tablet 5   empagliflozin (JARDIANCE) 10 MG TABS tablet Take 1 tablet (10 mg total) by mouth daily before breakfast. 30 tablet 3    EPINEPHrine 0.3 mg/0.3 mL IJ SOAJ injection Inject 0.3 mLs (0.3 mg total) into the muscle once. 1 Device 3   losartan (COZAAR) 100 MG tablet TAKE 1 TABLET EVERY DAY 90 tablet 2   metFORMIN (GLUCOPHAGE) 500 MG tablet TAKE 1 TABLET BY MOUTH 2 (TWO) TIMES DAILY WITH A MEAL. 180 tablet 2   Multiple Vitamin (MULTIVITAMIN) tablet Take 1 tablet by mouth daily.     nitroGLYCERIN (NITROSTAT) 0.4 MG SL tablet Place 1 tablet (0.4 mg total) under the tongue every 5 (five) minutes as needed. 25 tablet 11   simvastatin (ZOCOR) 20 MG tablet Take 20 mg by mouth daily.     VITAMIN D PO Take 1 tablet by mouth daily.     No current facility-administered medications on file prior to visit.    Allergies  Allergen Reactions   Acetaminophen Other (See Comments)    Drug induced hepatitis   Oxycodone-Acetaminophen Other (See Comments)    Drug induced hepatitis   Tamsulosin Nausea And Vomiting    incontinence   Flomax [Tamsulosin Hcl] Other (See Comments)    incontinence    Past Medical History:  Diagnosis Date   CAD (coronary artery disease)    Dr Merissa Renwick [place]   CAD, NATIVE VESSEL 10/20/2008        Carotid stenosis    Complication of anesthesia    " DIFFICULTY WAKING "   DM (  diabetes mellitus) [facility])    ED (erectile dysfunction)    ERECTILE DYSFUNCTION 07/23/2007   Qualifier: Diagnosis of  By: Alwyn Ren MD, Chrissie Noa     HLD (hyperlipidemia)    HTN (hypertension)    Hyperlipidemia 10/20/2008   Qualifier: Diagnosis of  By: Gala Romney, MD, Trixie Dredge    Myocardial infarction (HCC) 2007   Obesity    Obstructive sleep apnea 07/19/2010   NPSG 07/20/10- AHI 20.2/ hr    OSA (obstructive sleep apnea)    oral appliance, Dr Myrtis Ser   Paroxysmal atrial fibrillation [facility])    PREMATURE VENTRICULAR CONTRACTIONS, FREQUENT 01/31/2010   Qualifier: Diagnosis of  By: Gala Romney, MD, Trixie Dredge    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[facility])    Mother had DM    Typical atrial flutter (HCC)    VENTRAL HERNIA  01/08/2008   Qualifier: Diagnosis of  By: Alwyn Ren MD, Chrissie Noa      Past Surgical History:  Procedure Laterality Date   ANKLE FRACTURE SURGERY  1993   APPENDECTOMY  1958   CARDIAC CATHETERIZATION  2007 & 2011   CARDIAC CATHETERIZATION N/A 01/13/2015   Procedure: Left Heart Cath and Coronary Angiography;  Surgeon: Jameca Chumley M [place], MD;  Location[facility] INVASIVE CV LAB;  Service: Cardiovascular;  Laterality: N/A;   CARDIOVERSION N/A 09/16/2017   Procedure: CARDIOVERSION;  Surgeon: Lars Masson, MD;  Location: [facility] ENDOSCOPY;  Service: Cardiovascular;  Laterality: N/A;   COLONOSCOPY  2002   negative; Dr Juanda Chance   HAND SURGERY  1965   TONSILLECTOMY      Social History   Tobacco Use  Smoking Status Former   Current packs/day: 0.00   Types: Cigarettes   Quit date: 09/02/1990   Years since quitting: 32.7  Smokeless Tobacco Never  Tobacco Comments   onset age [age] up to 1 ppd    Social History   Substance and Sexual Activity  Alcohol Use No    Family History  Problem Relation Age of Onset   Diabetes Mother    Heart attack Father         4 vessel CBAG @ 64   Colon cancer Paternal Uncle    Diabetes Brother    Stroke Neg Hx     Review of Systems: As noted in history of present illness.  All other systems were reviewed and are negative.  Physical Exam: There were no vitals taken for this visit. GENERAL:  Well appearing overweight WM in NAD HEENT:  PERRL, EOMI, sclera are clear. Oropharynx is clear. NECK:  No jugular venous distention, carotid upstroke brisk and symmetric, no bruits, no thyromegaly or adenopathy LUNGS:  Clear to auscultation bilaterally CHEST:  Unremarkable HEART:  IRRR,  PMI not displaced or sustained,S1 and S2 within normal limits, no S3, no S4: no clicks, no rubs, no murmurs ABD:  Soft, nontender. BS +, no masses or bruits. No hepatomegaly, no splenomegaly EXT:  2 + pulses throughout, no edema, no cyanosis no clubbing SKIN:  Warm and dry.  No rashes NEURO:   Alert and oriented x 3. Cranial nerves II through XII intact. PSYCH:  Cognitively intact   LABORATORY DATA: Lab Results  Component Value Date   WBC 7.7 04/23/2023   HGB 15.6 04/23/2023   HCT 47.0 04/23/2023   PLT 177.0 04/23/2023   GLUCOSE 133 (H) 04/23/2023   CHOL 110 04/23/2023   TRIG 121.0 04/23/2023   HDL 37.80 (L) 04/23/2023   LDLDIRECT 40.0 02/16/2019   LDLCALC 48 04/23/2023  ALT 18 04/23/2023   AST 19 04/23/2023   NA 142 04/23/2023   K 4.1 04/23/2023   CL 105 04/23/2023   CREATININE 0.95 04/23/2023   BUN 17 04/23/2023   CO2 28 04/23/2023   TSH 3.20 04/23/2023   PSA 1.70 01/04/2009   INR 1.75 (H) 01/12/2015   HGBA1C 7.0 (H) 04/23/2023   MICROALBUR 1.1 10/03/2022   Ecg is  done today. Afib rate 74. Old anterior infarct . I have personally reviewed and interpreted this study.     Assessment / Plan: 1. 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 Moderate to severe 3 vessel disease. Patient has class 0-1 angina on medical therapy.   Will continue medical therapy. Continue lifestyle modification. Follow up in 6 months.  2. Hypertension, BP is well controlled. Continue current therapy  3. Hyperlipidemia well controlled on medication.  LDL41 on Zocor.   4. Atrial fibrillation, persistent now. S/p DCCV on 09/16/17. Recurrent as of June 2021.  He is asymptomatic and rate is well controlled. He has a Chadvasc score of 4. Continue anticoagulation with Eliquis 5 mg twice a day.   5. 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 Last A1c 8% . Recently added Jardiance   I will follow up in 6 months.

## 2023-05-15 ENCOUNTER — Encounter: Payer: Self-pay | Admitting: Cardiology

## 2023-05-15 ENCOUNTER — Ambulatory Visit: Payer: Medicare Other | Attending: Cardiology | Admitting: Cardiology

## 2023-05-15 VITALS — BP 110/72 | HR 77 | Ht 68.0 in | Wt 197.8 lb

## 2023-05-15 DIAGNOSIS — I4819 Other persistent atrial fibrillation: Secondary | ICD-10-CM | POA: Insufficient documentation

## 2023-05-15 DIAGNOSIS — I25118 Atherosclerotic heart disease of native coronary artery with other forms of angina pectoris: Secondary | ICD-10-CM | POA: Insufficient documentation

## 2023-05-15 DIAGNOSIS — E7800 Pure hypercholesterolemia, unspecified: Secondary | ICD-10-CM | POA: Diagnosis present

## 2023-05-15 DIAGNOSIS — I10 Essential (primary) hypertension: Secondary | ICD-10-CM | POA: Diagnosis present

## 2023-05-15 NOTE — Patient Instructions (Signed)
Medication Instructions:  Continue same medications *If you need a refill on your cardiac medications before your next appointment, please call your pharmacy*   Lab Work: None ordered   Testing/Procedures: None ordered   Follow-Up: At [facility], you and your health needs are our priority.  As part of our continuing mission to provide you with exceptional heart care, we have created designated Provider Care Teams.  These Care Teams include your primary Cardiologist (physician) and Advanced Practice Providers (APPs -  Physician Assistants and Nurse Practitioners) who all work together to provide you with the care you need, when you need it.  We recommend signing up for the patient portal called "MyChart".  Sign up information is provided on this After Visit Summary.  MyChart is used to connect with patients for Virtual Visits (Telemedicine).  Patients are able to view lab/test results, encounter notes, upcoming appointments, etc.  Non-urgent messages can be sent to your provider as well.   To learn more about what you can do with MyChart, go to [email or web address].    Your next appointment:  6 months    Call in Jan to schedule March appointment     Provider:  Dr.Jordan

## 2023-05-15 NOTE — Addendum Note (Signed)
Addended by: Derenda Fennel on: 05/15/2023 04:21 PM   Modules accepted: Orders

## 2023-05-30 ENCOUNTER — Ambulatory Visit: Payer: Medicare Other | Admitting: Cardiology

## 2023-06-27 ENCOUNTER — Ambulatory Visit (INDEPENDENT_AMBULATORY_CARE_PROVIDER_SITE_OTHER): Payer: Medicare Other | Admitting: Family Medicine

## 2023-06-27 DIAGNOSIS — Z23 Encounter for immunization: Secondary | ICD-10-CM | POA: Diagnosis not present

## 2023-06-27 NOTE — Progress Notes (Signed)
MCCRAE CHANN is a 82 y.o. male presents to the office today for High Dose Flu per physician's orders. Injection was administered Intramuscular Left deltoid.   Patient's next injection due next year, appt made? not applicable  Ester Rink

## 2023-07-23 ENCOUNTER — Encounter: Payer: Self-pay | Admitting: Family Medicine

## 2023-07-23 ENCOUNTER — Ambulatory Visit: Payer: Medicare Other | Admitting: Family Medicine

## 2023-07-23 VITALS — BP 110/68 | HR 80 | Temp 97.8°F | Ht 68.0 in | Wt 194.4 lb

## 2023-07-23 DIAGNOSIS — E1159 Type 2 diabetes mellitus with other circulatory complications: Secondary | ICD-10-CM | POA: Diagnosis not present

## 2023-07-23 DIAGNOSIS — Z7984 Long term (current) use of oral hypoglycemic drugs: Secondary | ICD-10-CM | POA: Diagnosis not present

## 2023-07-23 LAB — BASIC METABOLIC PANEL WITH GFR
BUN: 16 mg/dL (ref 6–23)
CO2: 29 meq/L (ref 19–32)
Calcium: 9.4 mg/dL (ref 8.4–10.5)
Chloride: 104 meq/L (ref 96–112)
Creatinine, Ser: 0.99 mg/dL (ref 0.40–1.50)
GFR: 70.92 mL/min
Glucose, Bld: 131 mg/dL — ABNORMAL HIGH (ref 70–99)
Potassium: 4.5 meq/L (ref 3.5–5.1)
Sodium: 139 meq/L (ref 135–145)

## 2023-07-23 LAB — HEMOGLOBIN A1C: Hgb A1c MFr Bld: 7.4 % — ABNORMAL HIGH (ref 4.6–6.5)

## 2023-07-23 NOTE — Patient Instructions (Signed)
Follow up in 4 months to recheck sugar, blood pressure, and cholesterol We'll notify you of your lab results and make any changes if needed Continue to work on healthy diet and regular exercise- you're down 4 lbs! Call with any questions or concerns Stay Safe!  Stay Healthy! Happy Holidays!!!

## 2023-07-23 NOTE — Progress Notes (Signed)
   Subjective:    Patient ID: Jacob Curry, male    DOB: [birth year], 82 y.o.   MRN: [id number]  HPI DM- chronic problem, on Jardiance 10mg  daily, Metformin 500mg  BID.  Last A1C 7%.  UTD on eye exam, foot exam, microalbumin.  Down 4 lbs.  Exercising regular- goes to gym.  Denies CP, SOB, HA's, visual changes, abd pain, N/V.  No symptomatic lows.  No numbness/tingling of hands/feet.   Review of Systems For ROS see HPI     Objective:   Physical Exam Constitutional:      General: He is not in acute distress.    Appearance: Normal appearance. He is well-developed. He is not ill-appearing.  HENT:     Head: Normocephalic and atraumatic.  Eyes:     Extraocular Movements: Extraocular movements intact.     Conjunctiva/sclera: Conjunctivae normal.     Pupils: Pupils are equal, round, and reactive to light.  Neck:     Thyroid: No thyromegaly.  Cardiovascular:     Rate and Rhythm: Normal rate and regular rhythm.     Pulses: Normal pulses.     Heart sounds: Normal heart sounds. No murmur heard. Pulmonary:     Effort: Pulmonary effort is normal. No respiratory distress.     Breath sounds: Normal breath sounds.  Abdominal:     General: Bowel sounds are normal. There is no distension.     Palpations: Abdomen is soft.  Musculoskeletal:     Cervical back: Normal range of motion and neck supple.     Right lower leg: No edema.     Left lower leg: No edema.  Lymphadenopathy:     Cervical: No cervical adenopathy.  Skin:    General: Skin is warm and dry.  Neurological:     General: No focal deficit present.     Mental Status: He is alert and oriented to person, place, and time.     Cranial Nerves: No cranial nerve deficit.  Psychiatric:        Mood and Affect: Mood normal.        Behavior: Behavior normal.           Assessment & Plan:

## 2023-07-23 NOTE — Assessment & Plan Note (Signed)
Ongoing issue for pt.  Currently on Jardiance and Metformin w/o difficulty.  Currently asymptomatic.  Check labs.  Adjust meds prn

## 2023-07-24 ENCOUNTER — Telehealth: Payer: Self-pay

## 2023-07-24 NOTE — Telephone Encounter (Signed)
-----   Message from Neena Rhymes sent at 07/24/2023  7:41 AM EST ----- A1C is up slightly- from 7 --> 7.4%  This will improve w/ healthy diet and regular exercise.  No med changes at this time.  You've got this!

## 2023-07-24 NOTE — Telephone Encounter (Signed)
Pt has reviewed via MyChart

## 2023-09-14 DIAGNOSIS — R051 Acute cough: Secondary | ICD-10-CM | POA: Diagnosis not present

## 2023-09-14 DIAGNOSIS — U071 COVID-19: Secondary | ICD-10-CM | POA: Diagnosis not present

## 2023-09-22 ENCOUNTER — Encounter: Payer: Self-pay | Admitting: Family Medicine

## 2023-09-22 ENCOUNTER — Ambulatory Visit (INDEPENDENT_AMBULATORY_CARE_PROVIDER_SITE_OTHER): Payer: Medicare Other | Admitting: Family Medicine

## 2023-09-22 VITALS — BP 138/70 | HR 87 | Temp 97.8°F | Resp 20 | Ht 68.0 in | Wt 193.0 lb

## 2023-09-22 DIAGNOSIS — E782 Mixed hyperlipidemia: Secondary | ICD-10-CM

## 2023-09-22 DIAGNOSIS — I10 Essential (primary) hypertension: Secondary | ICD-10-CM

## 2023-09-22 DIAGNOSIS — E1159 Type 2 diabetes mellitus with other circulatory complications: Secondary | ICD-10-CM

## 2023-09-22 DIAGNOSIS — U071 COVID-19: Secondary | ICD-10-CM

## 2023-09-22 MED ORDER — AZITHROMYCIN 250 MG PO TABS: ORAL_TABLET | ORAL | 0 refills | Status: DC

## 2023-09-22 MED ORDER — DEXAMETHASONE 6 MG PO TABS: 6.0000 mg | ORAL_TABLET | Freq: Every day | ORAL | 0 refills | Status: DC

## 2023-09-22 NOTE — Patient Instructions (Signed)
Hot tea/honey, cough syrup (Delsym), Tylenol, Vicks, and a humidifier at night.

## 2023-09-22 NOTE — Progress Notes (Signed)
Assessment & Plan:  1. COVID-19 (Primary) Education provided on COVID-19. Encouraged hot tea/honey, cough syrup (Delsym), Tylenol, Vicks, and a humidifier at night.  - azithromycin (ZITHROMAX) 250 MG tablet; Take 2 tablets on day 1, then 1 tablet daily on days 2 through 5  Dispense: 6 tablet; Refill: 0 - dexamethasone (DECADRON) 6 MG tablet; Take 1 tablet (6 mg total) by mouth daily for 5 days.  Dispense: 5 tablet; Refill: 0   Follow up plan: Return if symptoms worsen or fail to improve.  Deliah Boston, MSN, APRN, FNP-C  Subjective:  HPI: Jacob Curry is a 83 y.o. male presenting on 09/22/2023 for Cough (Cough, congestion and drainage - yellow, thick mucus /Had covid 2 weeks ago and these are lingering symptoms )  Patient spent 33 days in the hospital with his wife where he contracted COVID. He was diagnosed two weeks ago. He completed a course of Paxlovid, but his cough and congestion have not resolved. He reports thick yellow mucus and nasal drainage that is worse when he is lying down. He has an Albuterol inhaler that he has needed due to wheezing; he is using it every 4-6 hours. It is effective.    ROS: Negative unless specifically indicated above in HPI.   Relevant past medical history reviewed and updated as indicated.   Allergies and medications reviewed and updated.   Current Outpatient Medications:    albuterol (VENTOLIN HFA) 108 (90 Base) MCG/ACT inhaler, , Disp: , Rfl:    amLODipine (NORVASC) 2.5 MG tablet, Take 1 tablet (2.5 mg total) by mouth daily., Disp: 90 tablet, Rfl: 2   carvedilol (COREG) 12.5 MG tablet, TAKE 1 AND 1/2 TABLETS TWICE DAILY, Disp: 180 tablet, Rfl: 3   ELIQUIS 5 MG TABS tablet, TAKE 1 TABLET BY MOUTH TWICE A DAY, Disp: 60 tablet, Rfl: 5   empagliflozin (JARDIANCE) 10 MG TABS tablet, Take 1 tablet (10 mg total) by mouth daily before breakfast., Disp: 30 tablet, Rfl: 3   losartan (COZAAR) 100 MG tablet, TAKE 1 TABLET EVERY DAY, Disp: 90 tablet,  Rfl: 2   metFORMIN (GLUCOPHAGE) 500 MG tablet, TAKE 1 TABLET BY MOUTH 2 (TWO) TIMES DAILY WITH A MEAL., Disp: 180 tablet, Rfl: 2   Multiple Vitamin (MULTIVITAMIN) tablet, Take 1 tablet by mouth daily., Disp: , Rfl:    simvastatin (ZOCOR) 20 MG tablet, Take 20 mg by mouth daily., Disp: , Rfl:    VITAMIN D PO, Take 1 tablet by mouth daily., Disp: , Rfl:    EPINEPHrine 0.3 mg/0.3 mL IJ SOAJ injection, Inject 0.3 mLs (0.3 mg total) into the muscle once. (Patient not taking: Reported on 09/22/2023), Disp: 1 Device, Rfl: 3   nitroGLYCERIN (NITROSTAT) 0.4 MG SL tablet, Place 1 tablet (0.4 mg total) under the tongue every 5 (five) minutes as needed. (Patient not taking: Reported on 09/22/2023), Disp: 25 tablet, Rfl: 11  Allergies  Allergen Reactions   Acetaminophen Other (See Comments)    Drug induced hepatitis   Oxycodone-Acetaminophen Other (See Comments)    Drug induced hepatitis   Tamsulosin Nausea And Vomiting    incontinence   Flomax [Tamsulosin Hcl] Other (See Comments)    incontinence    Objective:   BP 138/70   Pulse 87   Temp 97.8 F (36.6 C)   Resp 20   Ht 5\' 8"  (1.727 m)   Wt 193 lb (87.5 kg)   SpO2 98%   BMI 29.35 kg/m    Physical Exam Vitals reviewed.  Constitutional:  General: He is not in acute distress.    Appearance: Normal appearance. He is not ill-appearing, toxic-appearing or diaphoretic.  HENT:     Head: Normocephalic and atraumatic.  Eyes:     General: No scleral icterus.       Right eye: No discharge.        Left eye: No discharge.     Conjunctiva/sclera: Conjunctivae normal.  Cardiovascular:     Rate and Rhythm: Normal rate. Rhythm irregular.     Heart sounds: Normal heart sounds. No murmur heard.    No friction rub. No gallop.  Pulmonary:     Effort: Pulmonary effort is normal. No respiratory distress.     Breath sounds: Normal breath sounds. No stridor. No wheezing, rhonchi or rales.  Musculoskeletal:        General: Normal range of motion.      Cervical back: Normal range of motion.  Skin:    General: Skin is warm and dry.  Neurological:     Mental Status: He is alert and oriented to person, place, and time. Mental status is at baseline.  Psychiatric:        Mood and Affect: Mood normal.        Behavior: Behavior normal.        Thought Content: Thought content normal.        Judgment: Judgment normal.

## 2023-09-23 ENCOUNTER — Encounter: Payer: Self-pay | Admitting: Family Medicine

## 2023-09-23 ENCOUNTER — Telehealth: Payer: Self-pay | Admitting: Family Medicine

## 2023-09-23 DIAGNOSIS — U071 COVID-19: Secondary | ICD-10-CM

## 2023-09-23 MED ORDER — DEXAMETHASONE 6 MG PO TABS: 6.0000 mg | ORAL_TABLET | Freq: Every day | ORAL | 0 refills | Status: AC

## 2023-09-23 MED ORDER — AZITHROMYCIN 250 MG PO TABS: ORAL_TABLET | ORAL | 0 refills | Status: AC

## 2023-09-23 NOTE — Telephone Encounter (Signed)
Medication resent. Message sent to patient via MyChart to make him aware.

## 2023-09-23 NOTE — Telephone Encounter (Signed)
[facility] [place] [facility] from [place].   Please re send: azithromycin (ZITHROMAX) 250 MG tablet  dexamethasone (DECADRON) 6 MG tablet   [facility]   Phone: [phone]  Fax: [phone]

## 2023-09-24 NOTE — Telephone Encounter (Unsigned)
Copied from CRM [id number]. Topic: Clinical - Prescription Issue >> Sep 23, 2023  1:17 PM Joanette Gula wrote: Patient stated that he was given 30 dexamethasone (DECADRON) 6 MG tablet  and was told to take 6 a day for 5 days... Upon reading the order, It's only supposed to be 1 tablet a day for 5 days, with a total of 5 tablets dispensed.

## 2023-11-20 ENCOUNTER — Ambulatory Visit: Payer: Medicare Other | Admitting: Family Medicine

## 2023-11-20 DIAGNOSIS — L97511 Non-pressure chronic ulcer of other part of right foot limited to breakdown of skin: Secondary | ICD-10-CM | POA: Diagnosis not present

## 2023-11-20 DIAGNOSIS — E119 Type 2 diabetes mellitus without complications: Secondary | ICD-10-CM | POA: Diagnosis not present

## 2023-11-20 DIAGNOSIS — L600 Ingrowing nail: Secondary | ICD-10-CM | POA: Diagnosis not present

## 2023-11-20 DIAGNOSIS — B351 Tinea unguium: Secondary | ICD-10-CM | POA: Diagnosis not present

## 2023-11-20 DIAGNOSIS — M898X9 Other specified disorders of bone, unspecified site: Secondary | ICD-10-CM | POA: Diagnosis not present

## 2023-11-21 LAB — HM DIABETES EYE EXAM

## 2023-11-24 DIAGNOSIS — E119 Type 2 diabetes mellitus without complications: Secondary | ICD-10-CM | POA: Diagnosis not present

## 2023-11-24 DIAGNOSIS — Z961 Presence of intraocular lens: Secondary | ICD-10-CM | POA: Diagnosis not present

## 2023-11-26 ENCOUNTER — Encounter: Payer: Self-pay | Admitting: Family Medicine

## 2023-11-28 NOTE — Progress Notes (Signed)
 Jacob Curry Date of Birth: 12-Feb-1941 Medical Record #[id number]  History of Present Illness: Jacob Curry is seen for follow up CAD. He has a known history of coronary disease with cardiac catheterization in November 2007 showing total occlusion of a large left circumflex vessel with good left to left collaterals. There was 40% disease in the left main and origin of LAD. 90% mid diagonal, 80-90% mid RCA and 90% RV marginal branch.  Ejection fraction was 50-55%. Prior to this he had a nuclear stress test- he was able to exercise 8:30 with severe ischemia of the inferolateral wall. He has been managed medically. Repeat cardiac cath in May 2011 and May 2016 showed no significant change. He has a history of hypertension, hyperlipidemia, Pafib,  diabetes. He has a history of moderate obstructive sleep apnea. This is managed with an oral appliance. He was unable to tolerate CPAP therapy.  He is anticoagulated with Eliquis.  In December 2018 he developed Afib. Was evaluated in Afib clinic and underwent successful DCCV on 09/16/17. When last seen in June he was back in Afib but was asymptomatic.   On follow up today he is doing well. Goes to gym at [employer] daily.  Feels well. No chest pain, dyspnea, palpitations, edema. Has had painful corns treated by podiatry   Current Outpatient Medications on File Prior to Visit  Medication Sig Dispense Refill   albuterol (VENTOLIN HFA) 108 (90 Base) MCG/ACT inhaler      amLODipine (NORVASC) 2.5 MG tablet Take 1 tablet (2.5 mg total) by mouth daily. 90 tablet 2   carvedilol (COREG) 12.5 MG tablet TAKE 1 AND 1/2 TABLETS TWICE DAILY 180 tablet 3   ELIQUIS 5 MG TABS tablet TAKE 1 TABLET BY MOUTH TWICE A DAY 60 tablet 5   empagliflozin (JARDIANCE) 10 MG TABS tablet Take 1 tablet (10 mg total) by mouth daily before breakfast. 30 tablet 3   EPINEPHrine 0.3 mg/0.3 mL IJ SOAJ injection Inject 0.3 mLs (0.3 mg total) into the muscle once. 1 Device 3   losartan (COZAAR) 100 MG  tablet TAKE 1 TABLET EVERY DAY 90 tablet 2   metFORMIN (GLUCOPHAGE) 500 MG tablet TAKE 1 TABLET BY MOUTH 2 (TWO) TIMES DAILY WITH A MEAL. 180 tablet 2   Multiple Vitamin (MULTIVITAMIN) tablet Take 1 tablet by mouth daily.     nitroGLYCERIN (NITROSTAT) 0.4 MG SL tablet Place 1 tablet (0.4 mg total) under the tongue every 5 (five) minutes as needed. 25 tablet 11   simvastatin (ZOCOR) 20 MG tablet Take 20 mg by mouth daily.     VITAMIN D PO Take 1 tablet by mouth daily.     No current facility-administered medications on file prior to visit.    Allergies  Allergen Reactions   Acetaminophen Other (See Comments)    Drug induced hepatitis   Oxycodone-Acetaminophen Other (See Comments)    Drug induced hepatitis   Tamsulosin Nausea And Vomiting    incontinence   Flomax [Tamsulosin Hcl] Other (See Comments)    incontinence    Past Medical History:  Diagnosis Date   CAD (coronary artery disease)    Dr Lakhia Gengler [place]   CAD, NATIVE VESSEL 10/20/2008        Carotid stenosis    Complication of anesthesia    " DIFFICULTY WAKING "   DM (diabetes mellitus) (HCC)    ED (erectile dysfunction)    ERECTILE DYSFUNCTION 07/23/2007   Qualifier: Diagnosis of  By: Alwyn Ren MD, Chrissie Noa     HLD (  hyperlipidemia)    HTN (hypertension)    Hyperlipidemia 10/20/2008   Qualifier: Diagnosis of  By: Gala Romney, MD, Trixie Dredge    Myocardial infarction [facility]) 2007   Obesity    Obstructive sleep apnea 07/19/2010   NPSG 07/20/10- AHI 20.2/ hr    OSA (obstructive sleep apnea)    oral appliance, Dr Myrtis Ser   Paroxysmal atrial fibrillation [facility])    PREMATURE VENTRICULAR CONTRACTIONS, FREQUENT 01/31/2010   Qualifier: Diagnosis of  By: Gala Romney, MD, Trixie Dredge    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 INVASIVE CV LAB;  Service: Cardiovascular;  Laterality: N/A;   CARDIOVERSION N/A 09/16/2017   Procedure: CARDIOVERSION;  Surgeon: Lars Masson, MD;  Location: [facility] ENDOSCOPY;  Service: Cardiovascular;  Laterality: N/A;   COLONOSCOPY  2002   negative; Dr Juanda Chance   HAND SURGERY  1965   TONSILLECTOMY      Social History   Tobacco Use  Smoking Status Former   Current packs/day: 0.00   Types: Cigarettes   Quit date: 09/02/1990   Years since quitting: 33.2  Smokeless Tobacco Never  Tobacco Comments   onset age [age] up to 1 ppd    Social History   Substance and Sexual Activity  Alcohol Use No    Family History  Problem Relation Age of Onset   Diabetes Mother    Heart attack Father         4 vessel CBAG @ 40   Colon cancer Paternal Uncle    Diabetes Brother    Stroke Neg Hx     Review of Systems: As noted in history of present illness.  All other systems were reviewed and are negative.  Physical Exam: BP 121/70   Pulse 62   Ht 5\' 8"  (1.727 m)   Wt 195 lb (88.5 kg)   SpO2 96%   BMI 29.65 kg/m  GENERAL:  Well appearing overweight WM in NAD HEENT:  PERRL, EOMI, sclera are clear. Oropharynx is clear. NECK:  No jugular venous distention, carotid upstroke brisk and symmetric, no bruits, no thyromegaly or adenopathy LUNGS:  Clear to auscultation bilaterally CHEST:  Unremarkable HEART:  IRRR,  PMI not displaced or sustained,S1 and S2 within normal limits, no S3, no S4: no clicks, no rubs, no murmurs ABD:  Soft, nontender. BS +, no masses or bruits. No hepatomegaly, no splenomegaly EXT:  2 + pulses throughout, no edema, no cyanosis no clubbing SKIN:  Warm and dry.  No rashes NEURO:  Alert and oriented x 3. Cranial nerves II through XII intact. PSYCH:   Cognitively intact   LABORATORY DATA: Lab Results  Component Value Date   WBC 7.7 04/23/2023   HGB 15.6 04/23/2023   HCT 47.0 04/23/2023   PLT 177.0 04/23/2023   GLUCOSE 131 (H) 07/23/2023   CHOL 110 04/23/2023   TRIG 121.0 04/23/2023   HDL 37.80 (L) 04/23/2023   LDLDIRECT 40.0 02/16/2019   LDLCALC 48 04/23/2023   ALT 18 04/23/2023   AST  19 04/23/2023   NA 139 07/23/2023   K 4.5 07/23/2023   CL 104 07/23/2023   CREATININE 0.99 07/23/2023   BUN 16 07/23/2023   CO2 29 07/23/2023   TSH 3.20 04/23/2023   PSA 1.70 01/04/2009   INR 1.75 (H) 01/12/2015   HGBA1C 7.4 (H) 07/23/2023   MICROALBUR 1.1 10/03/2022   EKG Interpretation Date/Time:  Tuesday December 02 2023 09:53:05 EDT Ventricular Rate:  62 PR Interval:    QRS Duration:  84 QT Interval:  378 QTC Calculation: 383 R Axis:   15  Text Interpretation: Atrial fibrillation Anteroseptal infarct (cited on or before 21-Aug-2017) When compared with ECG of March 2024  No significant change was found  Confirmed by [place], Jilliam Bellmore [phone]) on 12/02/2023 10:11:01 AM      Assessment / Plan: 1. 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 Moderate to severe 3 vessel disease. Patient has class 0-1 angina on medical therapy.   Will continue medical therapy. Continue lifestyle modification.  2. Hypertension, BP is well controlled. Continue current therapy  3. Hyperlipidemia well controlled on medication.  LDL48 on Zocor.   4. Atrial fibrillation, persistent  He is asymptomatic and rate is well controlled. He has a Chadvasc score of 4. Continue anticoagulation with Eliquis 5 mg twice a day.   5. DM type 2. On metformin and  Jardiance. Per PCP  I will follow up in 6 months.

## 2023-12-01 ENCOUNTER — Encounter (HOSPITAL_BASED_OUTPATIENT_CLINIC_OR_DEPARTMENT_OTHER): Payer: Self-pay

## 2023-12-02 ENCOUNTER — Encounter: Payer: Self-pay | Admitting: Cardiology

## 2023-12-02 ENCOUNTER — Ambulatory Visit: Payer: Medicare Other | Attending: Cardiology | Admitting: Cardiology

## 2023-12-02 VITALS — BP 121/70 | HR 62 | Ht 68.0 in | Wt 195.0 lb

## 2023-12-02 DIAGNOSIS — I4819 Other persistent atrial fibrillation: Secondary | ICD-10-CM

## 2023-12-02 DIAGNOSIS — E7800 Pure hypercholesterolemia, unspecified: Secondary | ICD-10-CM | POA: Diagnosis not present

## 2023-12-02 DIAGNOSIS — I10 Essential (primary) hypertension: Secondary | ICD-10-CM | POA: Diagnosis not present

## 2023-12-02 DIAGNOSIS — I25118 Atherosclerotic heart disease of native coronary artery with other forms of angina pectoris: Secondary | ICD-10-CM | POA: Diagnosis not present

## 2023-12-02 NOTE — Patient Instructions (Signed)
 Medication Instructions:  Continue same medications *If you need a refill on your cardiac medications before your next appointment, please call your pharmacy*  Lab Work: None ordered  Testing/Procedures: None ordered  Follow-Up: At [facility], you and your health needs are our priority.  As part of our continuing mission to provide you with exceptional heart care, our providers are all part of one team.  This team includes your primary Cardiologist (physician) and Advanced Practice Providers or APPs (Physician Assistants and Nurse Practitioners) who all work together to provide you with the care you need, when you need it.  Your next appointment:  6 months   Call in July to schedule Oct appointment     Provider:  Dr.Jordan  We recommend signing up for the patient portal called "MyChart".  Sign up information is provided on this After Visit Summary.  MyChart is used to connect with patients for Virtual Visits (Telemedicine).  Patients are able to view lab/test results, encounter notes, upcoming appointments, etc.  Non-urgent messages can be sent to your provider as well.   To learn more about what you can do with MyChart, go to [email or web address].         1st Floor: - Lobby - Registration  - Pharmacy  - Lab - Cafe  2nd Floor: - PV Lab - Diagnostic Testing (echo, CT, nuclear med)  3rd Floor: - Vacant  4th Floor: - TCTS (cardiothoracic surgery) - AFib Clinic - Structural Heart Clinic - Vascular Surgery  - Vascular Ultrasound  5th Floor: - [facility] (general and EP) - Clinical Pharmacy for coumadin, hypertension, lipid, weight-loss medications, and med management appointments    Valet parking services will be available as well.

## 2023-12-24 ENCOUNTER — Ambulatory Visit: Admitting: Family Medicine

## 2023-12-24 ENCOUNTER — Encounter: Payer: Self-pay | Admitting: Family Medicine

## 2023-12-24 VITALS — BP 118/70 | HR 78 | Temp 97.9°F | Ht 68.0 in | Wt 191.5 lb

## 2023-12-24 DIAGNOSIS — E782 Mixed hyperlipidemia: Secondary | ICD-10-CM | POA: Diagnosis not present

## 2023-12-24 DIAGNOSIS — E1159 Type 2 diabetes mellitus with other circulatory complications: Secondary | ICD-10-CM

## 2023-12-24 DIAGNOSIS — I10 Essential (primary) hypertension: Secondary | ICD-10-CM

## 2023-12-24 DIAGNOSIS — Z7984 Long term (current) use of oral hypoglycemic drugs: Secondary | ICD-10-CM | POA: Diagnosis not present

## 2023-12-24 LAB — BASIC METABOLIC PANEL WITH GFR
BUN: 16 mg/dL (ref 6–23)
CO2: 29 meq/L (ref 19–32)
Calcium: 9.4 mg/dL (ref 8.4–10.5)
Chloride: 104 meq/L (ref 96–112)
Creatinine, Ser: 0.91 mg/dL (ref 0.40–1.50)
GFR: 78.23 mL/min (ref >60.00)
Glucose, Bld: 127 mg/dL — ABNORMAL HIGH (ref 70–99)
Potassium: 4.1 meq/L (ref 3.5–5.1)
Sodium: 141 meq/L (ref 135–145)

## 2023-12-24 LAB — CBC WITH DIFFERENTIAL/PLATELET
Basophils Absolute: 0 10*3/uL (ref 0.0–0.1)
Basophils Relative: 0.6 % (ref 0.0–3.0)
Eosinophils Absolute: 0.1 10*3/uL (ref 0.0–0.7)
Eosinophils Relative: 1.5 % (ref 0.0–5.0)
HCT: 50 % (ref 39.0–52.0)
Hemoglobin: 16.3 g/dL (ref 13.0–17.0)
Lymphocytes Relative: 24.5 % (ref 12.0–46.0)
Lymphs Abs: 2.2 10*3/uL (ref 0.7–4.0)
MCHC: 32.6 g/dL (ref 30.0–36.0)
MCV: 92.5 fl (ref 78.0–100.0)
Monocytes Absolute: 0.7 10*3/uL (ref 0.1–1.0)
Monocytes Relative: 8.2 % (ref 3.0–12.0)
Neutro Abs: 5.8 10*3/uL (ref 1.4–7.7)
Neutrophils Relative %: 65.2 % (ref 43.0–77.0)
Platelets: 177 10*3/uL (ref 150.0–400.0)
RBC: 5.4 Mil/uL (ref 4.22–5.81)
RDW: 15 % (ref 11.5–15.5)
WBC: 8.9 10*3/uL (ref 4.0–10.5)

## 2023-12-24 LAB — LIPID PANEL
Cholesterol: 109 mg/dL (ref 0–200)
HDL: 43.1 mg/dL (ref >39.00)
LDL Cholesterol: 37 mg/dL (ref 0–99)
NonHDL: 66.11
Total CHOL/HDL Ratio: 3
Triglycerides: 146 mg/dL (ref 0.0–149.0)
VLDL: 29.2 mg/dL (ref 0.0–40.0)

## 2023-12-24 LAB — HEPATIC FUNCTION PANEL
ALT: 25 U/L (ref 0–53)
AST: 21 U/L (ref 0–37)
Albumin: 4.5 g/dL (ref 3.5–5.2)
Alkaline Phosphatase: 72 U/L (ref 39–117)
Bilirubin, Direct: 0.2 mg/dL (ref 0.0–0.3)
Total Bilirubin: 1 mg/dL (ref 0.2–1.2)
Total Protein: 7 g/dL (ref 6.0–8.3)

## 2023-12-24 LAB — TSH: TSH: 2.3 u[IU]/mL (ref 0.35–5.50)

## 2023-12-24 LAB — MICROALBUMIN / CREATININE URINE RATIO
Creatinine,U: 93.3 mg/dL
Microalb Creat Ratio: 12.1 mg/g (ref 0.0–30.0)
Microalb, Ur: 1.1 mg/dL (ref 0.0–1.9)

## 2023-12-24 LAB — HEMOGLOBIN A1C: Hgb A1c MFr Bld: 7.4 % — ABNORMAL HIGH (ref 4.6–6.5)

## 2023-12-24 NOTE — Progress Notes (Signed)
   Subjective:    Patient ID: Jacob Curry, male    DOB: [birth year], 83 y.o.   MRN: [id number]  HPI HTN- chronic problem, on Losartan  100mg  daily, Coreg  18.75mg  BID, Amlodipine  2.5mg  daily w/ good control.  No CP, SOB, HA's, visual changes, edema.  Hyperlipidemia- chronic problem, currently on Simvastatin  20mg  daily.  No abd pain, N/V.  DM- chronic problem, on Metformin  500mg  BID, Jardiance  10mg  daily.  UTD on eye exam, refused foot exam.  Due for microalbumin.  Continues to exercise regularly.  No symptomatic lows.  No numbness/tingling of hands/feet.   Review of Systems For ROS see HPI     Objective:   Physical Exam Vitals reviewed.  Constitutional:      General: He is not in acute distress.    Appearance: Normal appearance. He is well-developed. He is not ill-appearing.  HENT:     Head: Normocephalic and atraumatic.  Eyes:     Extraocular Movements: Extraocular movements intact.     Conjunctiva/sclera: Conjunctivae normal.     Pupils: Pupils are equal, round, and reactive to light.  Neck:     Thyroid : No thyromegaly.  Cardiovascular:     Rate and Rhythm: Normal rate. Rhythm irregular.     Pulses: Normal pulses.     Heart sounds: Normal heart sounds. No murmur heard. Pulmonary:     Effort: Pulmonary effort is normal. No respiratory distress.     Breath sounds: Normal breath sounds.  Abdominal:     General: Bowel sounds are normal. There is no distension.     Palpations: Abdomen is soft.  Musculoskeletal:     Cervical back: Normal range of motion and neck supple.     Right lower leg: No edema.     Left lower leg: No edema.  Lymphadenopathy:     Cervical: No cervical adenopathy.  Skin:    General: Skin is warm and dry.  Neurological:     General: No focal deficit present.     Mental Status: He is alert and oriented to person, place, and time.     Cranial Nerves: No cranial nerve deficit.  Psychiatric:        Mood and Affect: Mood normal.        Behavior:  Behavior normal.           Assessment & Plan:

## 2023-12-24 NOTE — Patient Instructions (Addendum)
 Follow up in 6 months to recheck diabetes, cholesterol, and blood pressure We'll notify you of your lab results and make any changes if needed Continue to work on healthy diet and regular exercise- you can do it! Try and limit sweets to once daily Call with any questions or concerns Stay Safe!  Stay Healthy! Happy Early [place]!!

## 2023-12-25 ENCOUNTER — Telehealth: Payer: Self-pay

## 2023-12-25 NOTE — Telephone Encounter (Signed)
-----   Message from Laymon Priest sent at 12/24/2023  4:15 PM EDT ----- Labs are stable and look good!  No med changes at this time

## 2023-12-25 NOTE — Telephone Encounter (Signed)
 Pt has reviewed labs via MyChart

## 2023-12-30 NOTE — Assessment & Plan Note (Signed)
 Chronic problem.  On Metformin  500mg  BID and Jardiance  10mg  daily.  UTD on eye exam, refused foot exam as he has recently been to podiatry.  Due for microalbumin- ordered.  Currently asymptomatic.  Check labs.  Adjust meds prn

## 2023-12-30 NOTE — Assessment & Plan Note (Signed)
 Chronic problem.  On Simvastatin 20mg  daily w/o difficulty.  Check labs.  Adjust meds prn

## 2023-12-30 NOTE — Assessment & Plan Note (Signed)
 Chronic problem.  Currently well controlled on Losartan , Coreg , Amlodipine .  Asymptomatic.  Check labs due to ARB use but no anticipated med changes.

## 2024-01-22 DIAGNOSIS — N401 Enlarged prostate with lower urinary tract symptoms: Secondary | ICD-10-CM | POA: Diagnosis not present

## 2024-01-22 DIAGNOSIS — N529 Male erectile dysfunction, unspecified: Secondary | ICD-10-CM | POA: Diagnosis not present

## 2024-01-22 DIAGNOSIS — N138 Other obstructive and reflux uropathy: Secondary | ICD-10-CM | POA: Diagnosis not present

## 2024-02-03 ENCOUNTER — Ambulatory Visit (INDEPENDENT_AMBULATORY_CARE_PROVIDER_SITE_OTHER): Admitting: Student in an Organized Health Care Education/Training Program

## 2024-02-03 ENCOUNTER — Encounter: Payer: Self-pay | Admitting: Student in an Organized Health Care Education/Training Program

## 2024-02-03 ENCOUNTER — Other Ambulatory Visit (HOSPITAL_BASED_OUTPATIENT_CLINIC_OR_DEPARTMENT_OTHER): Payer: Self-pay

## 2024-02-03 VITALS — BP 128/55 | HR 75 | Temp 97.7°F | Wt 194.0 lb

## 2024-02-03 DIAGNOSIS — J069 Acute upper respiratory infection, unspecified: Secondary | ICD-10-CM | POA: Diagnosis not present

## 2024-02-03 MED ORDER — BENZONATATE 100 MG PO CAPS
100.0000 mg | ORAL_CAPSULE | Freq: Three times a day (TID) | ORAL | 1 refills | Status: DC | PRN
  Filled 2024-02-03: qty 30, 10d supply, fill #0

## 2024-02-03 MED ORDER — AMOXICILLIN-POT CLAVULANATE 875-125 MG PO TABS
1.0000 | ORAL_TABLET | Freq: Two times a day (BID) | ORAL | 0 refills | Status: AC
  Filled 2024-02-03 – 2024-02-04 (×2): qty 14, 7d supply, fill #0

## 2024-02-03 MED ORDER — GUAIFENESIN-CODEINE 100-10 MG/5ML PO SOLN
5.0000 mL | Freq: Every evening | ORAL | 0 refills | Status: DC | PRN
  Filled 2024-02-03: qty 120, 24d supply, fill #0

## 2024-02-03 NOTE — Progress Notes (Signed)
 Acute Office Visit  Subjective:     Patient ID: Jacob Curry, male    DOB: 05/08/1941, 83 y.o.   MRN: [id number]  Chief Complaint  Patient presents with   Cough    Coughing and not feeling well. Has been going on since last Friday. Has been taking ibuprofen .     HPI  Patient is in today for concerns of cough since Friday.  Started to feel poorly over the weekend.  Cough is nonproductive.  Denies any fevers or chills.  He feels lousy all over.  No pain in his ears, denies sinus pressure or pain.  Not much nasal drainage.  No sore throats.  Dommer sputum production.  Denies any history of COPD or asthma.  Reports having pneumonia about 3 years ago.  Eating and drinking well.  Doing okay at home.  Still goes to the gym every day.  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  He is very worried about his respiratory infection becoming worse, he says he cannot afford to become sick and does not need to be physically present for his wife.      Objective:    BP (!) 128/55   Pulse 75   Temp 97.7 F (36.5 C) (Oral)   Wt 194 lb (88 kg)   SpO2 98%   BMI 29.50 kg/m    Physical Exam  Gen: Tired appearing man Ears: Bilateral tympanic membranes are normal, no erythema or effusions Mouth: Posterior pharynx only mildly inflamed, no exudate Heart: Regular, no murmur Lungs: unlabored, clear throughout, no crackles, no wheezing      Assessment & Plan:   Problem List Items Addressed This Visit       Unprioritized   URI (upper respiratory infection) - Primary   Acute problem.  Symptoms today are most consistent with upper respiratory tract infection/acute bronchitis.  We talked about the natural course of this.  Most likely viral etiology.  Not in the season for flu or COVID at this point.  I do not see any signs of otitis media, sinusitis, or pneumonia on exam.  We talked about the low utility of antibiotics in these cases.  Patient is worried about developing  pneumonia in the future which has happened to him in the past.  For that case, I have prescribed a course of Augmentin but recommended that he only fill it if he has worsening symptoms like fever or more systemic symptoms.  For supportive care of the cough I recommended guaifenesin  with codeine  at night, and Tessalon  during the day.  We talked about the risks of his medications.  We talked about the safe use of ibuprofen .      Relevant Medications   amoxicillin-clavulanate (AUGMENTIN) 875-125 MG tablet   guaiFENesin -codeine  100-10 MG/5ML syrup   benzonatate  (TESSALON  PERLES) 100 MG capsule    Meds ordered this encounter  Medications   amoxicillin-clavulanate (AUGMENTIN) 875-125 MG tablet    Sig: Take 1 tablet by mouth 2 (two) times daily for 7 days.    Dispense:  14 tablet    Refill:  0   guaiFENesin -codeine  100-10 MG/5ML syrup    Sig: Take 5 mLs by mouth at bedtime as needed for cough.    Dispense:  120 mL    Refill:  0   benzonatate  (TESSALON  PERLES) 100 MG capsule    Sig: Take 1 capsule (100 mg total) by mouth 3 (three) times daily as needed for cough.  Dispense:  30 capsule    Refill:  1    No follow-ups on file.  Ether Hercules, MD

## 2024-02-03 NOTE — Assessment & Plan Note (Signed)
 Acute problem.  Symptoms today are most consistent with upper respiratory tract infection/acute bronchitis.  We talked about the natural course of this.  Most likely viral etiology.  Not in the season for flu or COVID at this point.  I do not see any signs of otitis media, sinusitis, or pneumonia on exam.  We talked about the low utility of antibiotics in these cases.  Patient is worried about developing pneumonia in the future which has happened to him in the past.  For that case, I have prescribed a course of Augmentin but recommended that he only fill it if he has worsening symptoms like fever or more systemic symptoms.  For supportive care of the cough I recommended guaifenesin  with codeine  at night, and Tessalon  during the day.  We talked about the risks of his medications.  We talked about the safe use of ibuprofen .

## 2024-02-04 ENCOUNTER — Other Ambulatory Visit (HOSPITAL_BASED_OUTPATIENT_CLINIC_OR_DEPARTMENT_OTHER): Payer: Self-pay

## 2024-02-09 ENCOUNTER — Other Ambulatory Visit (HOSPITAL_BASED_OUTPATIENT_CLINIC_OR_DEPARTMENT_OTHER): Payer: Self-pay

## 2024-02-09 ENCOUNTER — Ambulatory Visit (INDEPENDENT_AMBULATORY_CARE_PROVIDER_SITE_OTHER): Admitting: Family Medicine

## 2024-02-09 ENCOUNTER — Other Ambulatory Visit: Payer: Self-pay

## 2024-02-09 ENCOUNTER — Encounter: Payer: Self-pay | Admitting: Family Medicine

## 2024-02-09 ENCOUNTER — Telehealth: Payer: Self-pay

## 2024-02-09 VITALS — BP 122/72 | HR 82 | Temp 98.4°F | Ht 68.0 in | Wt 190.2 lb

## 2024-02-09 DIAGNOSIS — R052 Subacute cough: Secondary | ICD-10-CM

## 2024-02-09 MED ORDER — ALBUTEROL SULFATE HFA 108 (90 BASE) MCG/ACT IN AERS
2.0000 | INHALATION_SPRAY | Freq: Four times a day (QID) | RESPIRATORY_TRACT | 3 refills | Status: DC | PRN
  Filled 2024-02-09: qty 6.7, 25d supply, fill #0

## 2024-02-09 MED ORDER — PREDNISONE 10 MG PO TABS: ORAL_TABLET | ORAL | 0 refills | Status: DC

## 2024-02-09 MED ORDER — ALBUTEROL SULFATE HFA 108 (90 BASE) MCG/ACT IN AERS: 2.0000 | INHALATION_SPRAY | Freq: Four times a day (QID) | RESPIRATORY_TRACT | 3 refills | Status: DC | PRN

## 2024-02-09 MED ORDER — PREDNISONE 10 MG PO TABS
ORAL_TABLET | ORAL | 0 refills | Status: AC
  Filled 2024-02-09: qty 18, 9d supply, fill #0

## 2024-02-09 NOTE — Telephone Encounter (Signed)
 Copied from CRM [id number]. Topic: Clinical - Prescription Issue >> Feb 09, 2024  3:09 PM Dewanda Foots wrote: Reason for CRM: [facility] that was [facility] for him today for albuterol  (VENTOLIN  HFA) 108 (90 Base) MCG/ACT inhaler  and predniSONE  (DELTASONE ) 10 MG tablet because he did not see their primary Dr.  Please call it in to this pharmacy instead: [facility] [address] [place] [address] Phone: [phone] Fax: [phone] Hours: Mon-Fri 7:30am-6pm; Sat 8:00am-4:30p   Please call patient back at [phone] once this is completed

## 2024-02-09 NOTE — Patient Instructions (Signed)
 Follow up as needed or as scheduled START the Prednisone  as directed- 3 pills at the same time x3 days, then 2 pills at the same time x3 days, then 1 pill daily.  Take w/ food  USE the inhaler- 2 puffs every 4-6 hrs as needed for cough/wheezing/shortness of breath Drink LOTS of fluids REST! Robitussin or Delsym as needed Call with any questions or concerns Hang in there!!

## 2024-02-09 NOTE — Progress Notes (Signed)
   Subjective:    Patient ID: Jacob Curry, male    DOB: 12-29-1940, 83 y.o.   MRN: [id number]  HPI Cough- pt was seen on 6/3 and started on Augmentin  for sinus infxn.  Pt reports cough is ongoing x3 weeks.  No change in sxs since starting abx.  Cough is not productive.  Denies SOB.  + wheezing at times.  Unable to lie flat due to cough.  Denies sinus pain/pressure.  No HA.  No ear pain.   Review of Systems For ROS see HPI     Objective:   Physical Exam Vitals reviewed.  Constitutional:      General: He is not in acute distress.    Appearance: Normal appearance. He is not ill-appearing.  HENT:     Head: Normocephalic and atraumatic.  Cardiovascular:     Rate and Rhythm: Normal rate and regular rhythm.  Pulmonary:     Effort: Pulmonary effort is normal. No respiratory distress.     Breath sounds: No wheezing or rhonchi.     Comments: Decreased air movement throughout w/ tight, hacking cough Skin:    General: Skin is warm and dry.  Neurological:     General: No focal deficit present.     Mental Status: He is alert and oriented to person, place, and time.  Psychiatric:        Mood and Affect: Mood normal.        Behavior: Behavior normal.        Thought Content: Thought content normal.           Assessment & Plan:  Cough- no change or improvement since starting Augmentin .  Today has decreased air movement in addition to tight, hacking cough.  Start Prednisone  taper.  Add Albuterol  inhaler.  Finish abx from last visit.  Reviewed supportive care and red flags that should prompt return.  Pt expressed understanding and is in agreement w/ plan.

## 2024-02-11 DIAGNOSIS — L821 Other seborrheic keratosis: Secondary | ICD-10-CM | POA: Diagnosis not present

## 2024-02-11 DIAGNOSIS — D225 Melanocytic nevi of trunk: Secondary | ICD-10-CM | POA: Diagnosis not present

## 2024-02-11 DIAGNOSIS — L814 Other melanin hyperpigmentation: Secondary | ICD-10-CM | POA: Diagnosis not present

## 2024-02-11 DIAGNOSIS — L570 Actinic keratosis: Secondary | ICD-10-CM | POA: Diagnosis not present

## 2024-02-26 ENCOUNTER — Encounter: Payer: Self-pay | Admitting: Family Medicine

## 2024-03-12 ENCOUNTER — Other Ambulatory Visit (HOSPITAL_BASED_OUTPATIENT_CLINIC_OR_DEPARTMENT_OTHER): Payer: Self-pay

## 2024-03-12 DIAGNOSIS — L03031 Cellulitis of right toe: Secondary | ICD-10-CM | POA: Diagnosis not present

## 2024-03-12 DIAGNOSIS — E119 Type 2 diabetes mellitus without complications: Secondary | ICD-10-CM | POA: Diagnosis not present

## 2024-03-12 DIAGNOSIS — L97511 Non-pressure chronic ulcer of other part of right foot limited to breakdown of skin: Secondary | ICD-10-CM | POA: Diagnosis not present

## 2024-03-12 LAB — HM DIABETES FOOT EXAM

## 2024-03-12 MED ORDER — AMOXICILLIN-POT CLAVULANATE 875-125 MG PO TABS
1.0000 | ORAL_TABLET | Freq: Two times a day (BID) | ORAL | 0 refills | Status: AC
  Filled 2024-03-12: qty 14, 7d supply, fill #0

## 2024-04-02 DIAGNOSIS — L97511 Non-pressure chronic ulcer of other part of right foot limited to breakdown of skin: Secondary | ICD-10-CM | POA: Diagnosis not present

## 2024-04-02 DIAGNOSIS — E119 Type 2 diabetes mellitus without complications: Secondary | ICD-10-CM | POA: Diagnosis not present

## 2024-05-25 ENCOUNTER — Other Ambulatory Visit (HOSPITAL_BASED_OUTPATIENT_CLINIC_OR_DEPARTMENT_OTHER): Payer: Self-pay

## 2024-05-28 ENCOUNTER — Other Ambulatory Visit (HOSPITAL_BASED_OUTPATIENT_CLINIC_OR_DEPARTMENT_OTHER): Payer: Self-pay

## 2024-05-28 DIAGNOSIS — Z23 Encounter for immunization: Secondary | ICD-10-CM | POA: Diagnosis not present

## 2024-05-28 MED ORDER — FLUZONE HIGH-DOSE 0.5 ML IM SUSY
0.5000 mL | PREFILLED_SYRINGE | Freq: Once | INTRAMUSCULAR | 0 refills | Status: AC
  Filled 2024-05-28: qty 0.5, 1d supply, fill #0

## 2024-05-28 MED ORDER — COMIRNATY 30 MCG/0.3ML IM SUSY
0.3000 mL | PREFILLED_SYRINGE | Freq: Once | INTRAMUSCULAR | 0 refills | Status: AC
  Filled 2024-05-28: qty 0.3, 1d supply, fill #0

## 2024-05-30 NOTE — Progress Notes (Signed)
 Jacob Curry Date of Birth: [birth year] Medical Record #[id number]  History of Present Illness: Jacob Curry is seen for follow up CAD. He has a known history of coronary disease with cardiac catheterization in November 2007 showing total occlusion of a large left circumflex vessel with good left to left collaterals. There was 40% disease in the left main and origin of LAD. 90% mid diagonal, 80-90% mid RCA and 90% RV marginal branch.  Ejection fraction was 50-55%. Prior to this he had a nuclear stress test- he was able to exercise 8:30 with severe ischemia of the inferolateral wall. He has been managed medically. Repeat cardiac cath in May 2011 and May 2016 showed no significant change. He has a history of hypertension, hyperlipidemia, Pafib,  diabetes. He has a history of moderate obstructive sleep apnea. This is managed with an oral appliance. He was unable to tolerate CPAP therapy.  He is anticoagulated with Eliquis .  In December 2018 he developed Afib. Was evaluated in Afib clinic and underwent successful DCCV on 09/16/17. When last seen in June he was back in Afib but was asymptomatic.   He reports recently he had the flu and Covid vaccines at the same time. Afterwards develop significant bilateral shoulder and arm pain with difficulty being able to lift arms. Now on steroids. No cardiac complaints. Denies chest pain or palpitations.    Current Outpatient Medications on File Prior to Visit  Medication Sig Dispense Refill   carvedilol  (COREG ) 12.5 MG tablet TAKE 1 AND 1/2 TABLETS TWICE DAILY 180 tablet 3   ELIQUIS  5 MG TABS tablet TAKE 1 TABLET BY MOUTH TWICE A DAY 60 tablet 5   EPINEPHrine  0.3 mg/0.3 mL IJ SOAJ injection Inject 0.3 mLs (0.3 mg total) into the muscle once. 1 Device 3   losartan  (COZAAR ) 100 MG tablet TAKE 1 TABLET EVERY DAY 90 tablet 2   Multiple Vitamin (MULTIVITAMIN) tablet Take 1 tablet by mouth daily.     nitroGLYCERIN  (NITROSTAT ) 0.4 MG SL tablet Place 1 tablet (0.4 mg total)  under the tongue every 5 (five) minutes as needed. 25 tablet 11   simvastatin  (ZOCOR ) 20 MG tablet Take 20 mg by mouth daily.     VITAMIN D PO Take 1 tablet by mouth daily.     amLODipine  (NORVASC ) 2.5 MG tablet Take 1 tablet (2.5 mg total) by mouth daily. (Patient not taking: Reported on 06/09/2024) 90 tablet 2   No current facility-administered medications on file prior to visit.    Allergies  Allergen Reactions   Acetaminophen Other (See Comments)    Drug induced hepatitis   Oxycodone-Acetaminophen Other (See Comments)    Drug induced hepatitis   Tamsulosin  Nausea And Vomiting    incontinence   Flomax  [Tamsulosin  Hcl] Other (See Comments)    incontinence    Past Medical History:  Diagnosis Date   CAD (coronary artery disease)    Dr Franco Duley [place]   CAD, NATIVE VESSEL 10/20/2008        Carotid stenosis    Complication of anesthesia     DIFFICULTY WAKING    DM (diabetes mellitus) (HCC)    ED (erectile dysfunction)    ERECTILE DYSFUNCTION 07/23/2007   Qualifier: Diagnosis of  By: Tish MD, Jacob     HLD (hyperlipidemia)    HTN (hypertension)    Hyperlipidemia 10/20/2008   Qualifier: Diagnosis of  By: Cherrie, MD, CODY Toribio SAUNDERS    Myocardial infarction [facility]) 2007   Obesity    Obstructive sleep apnea 07/19/2010  NPSG 07/20/10- AHI 20.2/ hr    OSA (obstructive sleep apnea)    oral appliance, Dr Micky   Paroxysmal atrial fibrillation [facility])    PREMATURE VENTRICULAR CONTRACTIONS, FREQUENT 01/31/2010   Qualifier: Diagnosis of  By: Cherrie, MD, CODY Toribio SAUNDERS    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[facility])    Mother had DM    Typical atrial flutter (HCC)    VENTRAL HERNIA 01/08/2008   Qualifier: Diagnosis of  By: Tish MD, Jacob      Past Surgical History:  Procedure Laterality Date   ANKLE FRACTURE SURGERY  1993   APPENDECTOMY  1958   CARDIAC CATHETERIZATION  2007 & 2011   CARDIAC CATHETERIZATION N/A 01/13/2015   Procedure: Left Heart Cath and Coronary  Angiography;  Surgeon: Floreine Kingdon M [place], MD;  Location: [facility] INVASIVE CV LAB;  Service: Cardiovascular;  Laterality: N/A;   CARDIOVERSION N/A 09/16/2017   Procedure: CARDIOVERSION;  Surgeon: Maranda Leim DEL, MD;  Location: [facility] ENDOSCOPY;  Service: Cardiovascular;  Laterality: N/A;   COLONOSCOPY  2002   negative; Dr Obie   HAND SURGERY  1965   TONSILLECTOMY      Social History   Tobacco Use  Smoking Status Former   Current packs/day: 0.00   Types: Cigarettes   Quit date: 09/02/1990   Years since quitting: 33.7  Smokeless Tobacco Never  Tobacco Comments   onset age [age] up to 1 ppd    Social History   Substance and Sexual Activity  Alcohol Use No    Family History  Problem Relation Age of Onset   Diabetes Mother    Heart attack Father         4 vessel CBAG @ 62   Colon cancer Paternal Uncle    Diabetes Brother    Stroke Neg Hx     Review of Systems: As noted in history of present illness.  All other systems were reviewed and are negative.  Physical Exam: BP 120/70 (BP Location: Right Arm, Patient Position: Sitting, Cuff Size: Normal)   Pulse 76   Ht 5' 8 (1.727 m)   Wt 189 lb (85.7 kg)   SpO2 96%   BMI 28.74 kg/m  GENERAL:  Well appearing overweight WM in NAD HEENT:  PERRL, EOMI, sclera are clear. Oropharynx is clear. NECK:  No jugular venous distention, carotid upstroke brisk and symmetric, no bruits, no thyromegaly or adenopathy LUNGS:  Clear to auscultation bilaterally CHEST:  Unremarkable HEART:  IRRR,  PMI not displaced or sustained,S1 and S2 within normal limits, no S3, no S4: no clicks, no rubs, no murmurs ABD:  Soft, nontender. BS +, no masses or bruits. No hepatomegaly, no splenomegaly EXT:  2 + pulses throughout, no edema, no cyanosis no clubbing SKIN:  Warm and dry.  No rashes NEURO:  Alert and oriented x 3. Cranial nerves II through XII intact. PSYCH:  Cognitively intact   LABORATORY DATA: Lab Results  Component Value Date   WBC 8.9 12/24/2023    HGB 16.3 12/24/2023   HCT 50.0 12/24/2023   PLT 177.0 12/24/2023   GLUCOSE 127 (H) 12/24/2023   CHOL 109 12/24/2023   TRIG 146.0 12/24/2023   HDL 43.10 12/24/2023   LDLDIRECT 40.0 02/16/2019   LDLCALC 37 12/24/2023   ALT 25 12/24/2023   AST 21 12/24/2023   NA 141 12/24/2023   K 4.1 12/24/2023   CL 104 12/24/2023   CREATININE 0.91 12/24/2023   BUN 16 12/24/2023   CO2 29 12/24/2023  TSH 2.30 12/24/2023   PSA 1.70 01/04/2009   INR 1.75 (H) 01/12/2015   HGBA1C 7.4 (H) 12/24/2023   MICROALBUR 1.1 12/24/2023          Assessment / Plan: 1. Coronary disease with chronic total occlusion of the left circumflex since 2007. Moderate to severe 3 vessel disease. Patient has class 0-1 angina on medical therapy.   Will continue medical therapy. Continue lifestyle modification.  2. Hypertension, BP is well controlled. Continue current therapy  3. Hyperlipidemia well controlled on medication.  LDL 37 on Zocor .   4. Atrial fibrillation, persistent  He is asymptomatic and rate is well controlled. Had prior DCCV but Afib recurred. He has a Chadvasc score of 4. Continue anticoagulation with Eliquis  5 mg twice a day.   5. DM type 2. Per PCP  I will follow up in 6 months.

## 2024-06-07 ENCOUNTER — Other Ambulatory Visit (HOSPITAL_BASED_OUTPATIENT_CLINIC_OR_DEPARTMENT_OTHER): Payer: Self-pay

## 2024-06-08 ENCOUNTER — Other Ambulatory Visit (HOSPITAL_BASED_OUTPATIENT_CLINIC_OR_DEPARTMENT_OTHER): Payer: Self-pay

## 2024-06-08 ENCOUNTER — Ambulatory Visit: Payer: Self-pay

## 2024-06-08 NOTE — Telephone Encounter (Signed)
 FYI Only or Action Required?: FYI only for provider.  Pt was advised by rheumatologist to call PCP for assistance  Patient was last seen in primary care on 02/09/2024 by Mahlon Comer BRAVO, MD.  Called Nurse Triage reporting joint soreness.both shoulders right >left  Symptoms began week ago Friday.  Interventions attempted: OTC medications: Ibuprofen .  Symptoms are: unchanged.  Triage Disposition: No disposition on file.  Patient/caregiver understands and will follow disposition?:         Copied from CRM #[id number]. Topic: Clinical - Red Word Triage >> Jun 08, 2024  8:53 AM Robinson H wrote: [facility] that prompted transfer to Nurse Triage: Reaction to flu and covid shot, was told to follow up pcp by Rheumatologist, joints are sore Reason for Disposition  [1] MODERATE pain (e.g., interferes with normal activities) AND [2] present > 3 days  Answer Assessment - Initial Assessment Questions 1. SYMPTOMS: What is the main symptom? (e.g., pain, redness, or swelling at injection site; feeling tired, fever, muscle aches)      Joint pain shoulders right worse than left  2. ONSET: When was the vaccine (shot) given? How much later did the *No Answer* begin? (e.g., hours, days ago)      Week ago Friday  3. SEVERITY: How bad is it?      Severe with lifting 4. FEVER: Do you have a fever? If Yes, ask: What is your temperature, how was it measured, and when did it start?      no 5. IMMUNIZATIONS GIVEN: What shots have you recently received?     pna 6. PAST REACTIONS: Have you reacted to immunizations before? If Yes, ask: What happened?     no 7. OTHER SYMPTOMS: Do you have any other symptoms?     N/a 8. PREGNANCY: Is there any chance you are pregnant? When was your last menstrual period?     N/a  Answer Assessment - Initial Assessment Questions 1. ONSET: When did the pain start?     Week ago Friday 2. LOCATION: Where is the pain located?     Both shoulders  Right > left 3. PAIN: How bad is the pain? (Scale 1-10; or mild, moderate, severe)     When sitting still and not having to raise arms or lift pain is 0/10- if have to use or raise arms moderate to severe 4. WORK OR EXERCISE: Has there been any recent work or exercise that involved this part of the body?     No received Covid and flu vax 5. CAUSE: What do you think is causing the shoulder pain?     vax 6. OTHER SYMPTOMS: Do you have any other symptoms? (e.g., neck pain, swelling, rash, fever, numbness, weakness)     no 7. PREGNANCY: Is there any chance you are pregnant? When was your last menstrual period?     N/a  Protocols used: Immunization Reactions-A-AH, Shoulder Pain-A-AH

## 2024-06-08 NOTE — Telephone Encounter (Signed)
 Patient has appt tomorrow. Reports joints are sore from COVID/FLU shot. Rheumatologist advised patient to reach out to us .

## 2024-06-09 ENCOUNTER — Other Ambulatory Visit (HOSPITAL_BASED_OUTPATIENT_CLINIC_OR_DEPARTMENT_OTHER): Payer: Self-pay

## 2024-06-09 ENCOUNTER — Ambulatory Visit: Admitting: Family Medicine

## 2024-06-09 ENCOUNTER — Encounter: Payer: Self-pay | Admitting: Cardiology

## 2024-06-09 ENCOUNTER — Encounter: Payer: Self-pay | Admitting: Family Medicine

## 2024-06-09 ENCOUNTER — Ambulatory Visit: Attending: Cardiology | Admitting: Cardiology

## 2024-06-09 VITALS — BP 120/70 | HR 78 | Temp 98.6°F | Wt 188.6 lb

## 2024-06-09 VITALS — BP 120/70 | HR 76 | Ht 68.0 in | Wt 189.0 lb

## 2024-06-09 DIAGNOSIS — M2550 Pain in unspecified joint: Secondary | ICD-10-CM | POA: Diagnosis not present

## 2024-06-09 DIAGNOSIS — I4819 Other persistent atrial fibrillation: Secondary | ICD-10-CM | POA: Insufficient documentation

## 2024-06-09 DIAGNOSIS — I25118 Atherosclerotic heart disease of native coronary artery with other forms of angina pectoris: Secondary | ICD-10-CM | POA: Insufficient documentation

## 2024-06-09 DIAGNOSIS — E7800 Pure hypercholesterolemia, unspecified: Secondary | ICD-10-CM | POA: Insufficient documentation

## 2024-06-09 DIAGNOSIS — I10 Essential (primary) hypertension: Secondary | ICD-10-CM | POA: Diagnosis not present

## 2024-06-09 MED ORDER — SYNJARDY 12.5-500 MG PO TABS: 1.0000 | ORAL_TABLET | Freq: Two times a day (BID) | ORAL | Status: DC

## 2024-06-09 MED ORDER — PREDNISONE 10 MG PO TABS
ORAL_TABLET | ORAL | 0 refills | Status: AC
  Filled 2024-06-09: qty 18, 9d supply, fill #0

## 2024-06-09 NOTE — Patient Instructions (Signed)
 Follow up as scheduled or as needed START the Prednisone  as directed- 3 pills at the same time x3 days, then 2 pills at the same time x3 days, then 1 pill daily.  Take w/ food Alternate heat or ice for pain relief Call with any questions or concerns- particularly if not improvement Hang in there!

## 2024-06-09 NOTE — Progress Notes (Signed)
   Subjective:    Patient ID: Jacob Curry, male    DOB: Feb 19, 1941, 83 y.o.   MRN: [id number]  HPI Polyarthralgia- pt got flu shot and COVID shot at the same time and the next day was not able to raise either arm.  He then developed joint pain all over.  This started 2 weeks ago.  Other joint pains have returned to baseline.  Still has severely limited ROM of shoulders bilaterally.  Having difficulty sleeping at night due to pain.     Review of Systems For ROS see HPI     Objective:   Physical Exam Vitals reviewed.  Constitutional:      General: He is not in acute distress.    Appearance: Normal appearance. He is not ill-appearing.  HENT:     Head: Normocephalic and atraumatic.  Eyes:     Extraocular Movements: Extraocular movements intact.     Conjunctiva/sclera: Conjunctivae normal.  Musculoskeletal:        General: No swelling or deformity.     Cervical back: Neck supple.     Comments: Limited ROM of shoulders bilaterally 2/2 pain  Lymphadenopathy:     Cervical: No cervical adenopathy.  Skin:    General: Skin is warm and dry.  Neurological:     General: No focal deficit present.     Mental Status: He is alert and oriented to person, place, and time.  Psychiatric:        Mood and Affect: Mood normal.        Behavior: Behavior normal.        Thought Content: Thought content normal.           Assessment & Plan:  Polyarthralgia- new.  Pt had both COVID and high dose flu shot at same time.  He subsequently developed pain in multiple joints.  Thankfully other joint pain has improved but he continues to have pain and decreased ROM in shoulders bilaterally.  Will start Prednisone  taper.  If no improvement after prednisone  will need Ortho or Rheum referral.  Pt expressed understanding and is in agreement w/ plan.

## 2024-06-09 NOTE — Patient Instructions (Signed)
 Medication Instructions:  Continue same medications *If you need a refill on your cardiac medications before your next appointment, please call your pharmacy*  Lab Work: None ordered  Testing/Procedures: None ordered  Follow-Up: At [facility], you and your health needs are our priority.  As part of our continuing mission to provide you with exceptional heart care, our providers are all part of one team.  This team includes your primary Cardiologist (physician) and Advanced Practice Providers or APPs (Physician Assistants and Nurse Practitioners) who all work together to provide you with the care you need, when you need it.  Your next appointment  6 months   Call in Jan to schedule April appointment     Provider:  Dr.Jordan   We recommend signing up for the patient portal called MyChart.  Sign up information is provided on this After Visit Summary.  MyChart is used to connect with patients for Virtual Visits (Telemedicine).  Patients are able to view lab/test results, encounter notes, upcoming appointments, etc.  Non-urgent messages can be sent to your provider as well.   To learn more about what you can do with MyChart, go to [email or web address].

## 2024-06-24 ENCOUNTER — Ambulatory Visit: Admitting: Family Medicine

## 2024-06-24 ENCOUNTER — Other Ambulatory Visit (HOSPITAL_BASED_OUTPATIENT_CLINIC_OR_DEPARTMENT_OTHER): Payer: Self-pay

## 2024-06-24 ENCOUNTER — Encounter: Payer: Self-pay | Admitting: Family Medicine

## 2024-06-24 VITALS — BP 126/68 | HR 65 | Temp 97.8°F | Resp 16 | Ht 68.0 in | Wt 186.4 lb

## 2024-06-24 DIAGNOSIS — E782 Mixed hyperlipidemia: Secondary | ICD-10-CM

## 2024-06-24 DIAGNOSIS — I10 Essential (primary) hypertension: Secondary | ICD-10-CM | POA: Diagnosis not present

## 2024-06-24 DIAGNOSIS — E1159 Type 2 diabetes mellitus with other circulatory complications: Secondary | ICD-10-CM

## 2024-06-24 DIAGNOSIS — M2550 Pain in unspecified joint: Secondary | ICD-10-CM

## 2024-06-24 DIAGNOSIS — Z7985 Long-term (current) use of injectable non-insulin antidiabetic drugs: Secondary | ICD-10-CM | POA: Diagnosis not present

## 2024-06-24 LAB — HEPATIC FUNCTION PANEL
ALT: 20 U/L (ref 0–53)
AST: 17 U/L (ref 0–37)
Albumin: 4.1 g/dL (ref 3.5–5.2)
Alkaline Phosphatase: 75 U/L (ref 39–117)
Bilirubin, Direct: 0.2 mg/dL (ref 0.0–0.3)
Total Bilirubin: 0.9 mg/dL (ref 0.2–1.2)
Total Protein: 7 g/dL (ref 6.0–8.3)

## 2024-06-24 LAB — LIPID PANEL
Cholesterol: 102 mg/dL (ref 0–200)
HDL: 41.7 mg/dL (ref >39.00)
LDL Cholesterol: 34 mg/dL (ref 0–99)
NonHDL: 60.57
Total CHOL/HDL Ratio: 2
Triglycerides: 135 mg/dL (ref 0.0–149.0)
VLDL: 27 mg/dL (ref 0.0–40.0)

## 2024-06-24 LAB — BASIC METABOLIC PANEL WITH GFR
BUN: 15 mg/dL (ref 6–23)
CO2: 26 meq/L (ref 19–32)
Calcium: 9 mg/dL (ref 8.4–10.5)
Chloride: 104 meq/L (ref 96–112)
Creatinine, Ser: 0.79 mg/dL (ref 0.40–1.50)
GFR: 82.17 mL/min (ref >60.00)
Glucose, Bld: 132 mg/dL — ABNORMAL HIGH (ref 70–99)
Potassium: 4.4 meq/L (ref 3.5–5.1)
Sodium: 140 meq/L (ref 135–145)

## 2024-06-24 LAB — SEDIMENTATION RATE: Sed Rate: 19 mm/h (ref 0–20)

## 2024-06-24 LAB — CBC WITH DIFFERENTIAL/PLATELET
Basophils Absolute: 0 K/uL (ref 0.0–0.1)
Basophils Relative: 0.4 % (ref 0.0–3.0)
Eosinophils Absolute: 0.2 K/uL (ref 0.0–0.7)
Eosinophils Relative: 1.1 % (ref 0.0–5.0)
HCT: 46.5 % (ref 39.0–52.0)
Hemoglobin: 15.1 g/dL (ref 13.0–17.0)
Lymphocytes Relative: 23.4 % (ref 12.0–46.0)
Lymphs Abs: 3.1 K/uL (ref 0.7–4.0)
MCHC: 32.6 g/dL (ref 30.0–36.0)
MCV: 87.9 fl (ref 78.0–100.0)
Monocytes Absolute: 1.1 K/uL — ABNORMAL HIGH (ref 0.1–1.0)
Monocytes Relative: 8.2 % (ref 3.0–12.0)
Neutro Abs: 8.8 K/uL — ABNORMAL HIGH (ref 1.4–7.7)
Neutrophils Relative %: 66.9 % (ref 43.0–77.0)
Platelets: 165 K/uL (ref 150.0–400.0)
RBC: 5.29 Mil/uL (ref 4.22–5.81)
RDW: 15.1 % (ref 11.5–15.5)
WBC: 13.2 K/uL — ABNORMAL HIGH (ref 4.0–10.5)

## 2024-06-24 LAB — HEMOGLOBIN A1C: Hgb A1c MFr Bld: 8.2 % — ABNORMAL HIGH (ref 4.6–6.5)

## 2024-06-24 LAB — TSH: TSH: 2.94 u[IU]/mL (ref 0.35–5.50)

## 2024-06-24 MED ORDER — PREDNISONE 10 MG PO TABS: ORAL_TABLET | ORAL | 0 refills | Status: DC

## 2024-06-24 MED ORDER — PREDNISONE 10 MG PO TABS
ORAL_TABLET | ORAL | 0 refills | Status: DC
  Filled 2024-06-24: qty 18, 9d supply, fill #0

## 2024-06-24 NOTE — Patient Instructions (Signed)
 Follow up 6 months to recheck blood pressure, sugar, cholesterol We'll notify you of your lab results and make any changes if needed START the Prednisone  as directed- 3 pills at the same time x3 days, then 2 pills at the same time x3 days, then 1 pill daily.  Take w/ food  IF the Sed Rate is elevated, we'll refer you to Rheumatology Continue to work on healthy diet and regular exercise- you can do it! Call with any questions or concerns Stay Safe!  Stay Healthy! Hang in there!!

## 2024-06-24 NOTE — Assessment & Plan Note (Signed)
 Chronic problem.  Currently on Synjardy 12.5/500mg  BID.  Asymptomatic at this time.  Check labs.  Adjust meds prn

## 2024-06-24 NOTE — Progress Notes (Addendum)
   Subjective:    Patient ID: Jacob Curry, male    DOB: [birth year], 83 y.o.   MRN: [id number]  HPI HTN- chronic problem, on Coreg  18.75mg  BID, Losartan  100mg  daily.  Is prescribed Amlodipine  but not currently taking.  No CP, SOB, HA's, visual changes, edema.  DM- chronic problem, on Synjardy 12.5/500mg  BID.  No symptomatic lows.  No numbness/tingling of hands/feet.  Hyperlipidemia- chronic problem, on Simvastatin  20mg  daily.  No abd pain, N/V.  Bilateral shoulder pain- pt reports pain improved almost immediately w/ Prednisone  but once he tapered off medication, sxs returned and he is back to very limited motion of both arms.     Review of Systems For ROS see HPI     Objective:   Physical Exam Vitals reviewed.  Constitutional:      General: He is not in acute distress.    Appearance: Normal appearance. He is well-developed. He is not ill-appearing.  HENT:     Head: Normocephalic and atraumatic.  Eyes:     Extraocular Movements: Extraocular movements intact.     Conjunctiva/sclera: Conjunctivae normal.     Pupils: Pupils are equal, round, and reactive to light.  Neck:     Thyroid : No thyromegaly.  Cardiovascular:     Rate and Rhythm: Normal rate and regular rhythm.     Pulses: Normal pulses.     Heart sounds: Normal heart sounds. No murmur heard. Pulmonary:     Effort: Pulmonary effort is normal. No respiratory distress.     Breath sounds: Normal breath sounds.  Abdominal:     General: Bowel sounds are normal. There is no distension.     Palpations: Abdomen is soft.  Musculoskeletal:     Cervical back: Normal range of motion and neck supple.     Right lower leg: No edema.     Left lower leg: No edema.     Comments: Limited ROM shoulders bilaterally  Lymphadenopathy:     Cervical: No cervical adenopathy.  Skin:    General: Skin is warm and dry.  Neurological:     General: No focal deficit present.     Mental Status: He is alert and oriented to person, place, and  time.     Cranial Nerves: No cranial nerve deficit.  Psychiatric:        Mood and Affect: Mood normal.        Behavior: Behavior normal.           Assessment & Plan:  Polyarthralgia- recurrent problem.  Pt reports shoulders improved w/ Prednisone  but pain and limited ROM returned ~1 week after stopping Prednisone .  Sxs are concerning for PMR.  Check ESR.  Restart Prednisone .  Will likely need Rheum referral.  Pt expressed understanding and is in agreement w/ plan.

## 2024-06-24 NOTE — Assessment & Plan Note (Signed)
 Chronic problem.  On Simvastatin 20mg  daily w/o difficulty.  Check labs.  Adjust meds prn

## 2024-06-24 NOTE — Assessment & Plan Note (Signed)
 Chronic problem.  Currently on Coreg  and Losartan .  Asymptomatic.  Check labs due to ARB use but no anticipated med changes.  Will follow.

## 2024-06-24 NOTE — Addendum Note (Signed)
 Addended by: Jahmal Dunavant E on: 06/24/2024 11:37 AM   Modules accepted: Orders

## 2024-06-25 ENCOUNTER — Ambulatory Visit: Payer: Self-pay | Admitting: Family Medicine

## 2024-07-13 ENCOUNTER — Other Ambulatory Visit: Payer: Self-pay | Admitting: Family

## 2024-07-13 DIAGNOSIS — M25511 Pain in right shoulder: Secondary | ICD-10-CM

## 2024-07-16 ENCOUNTER — Ambulatory Visit (INDEPENDENT_AMBULATORY_CARE_PROVIDER_SITE_OTHER): Admitting: Family Medicine

## 2024-07-16 VITALS — BP 116/74 | HR 61 | Temp 98.2°F | Resp 16 | Ht 68.0 in | Wt 185.1 lb

## 2024-07-16 DIAGNOSIS — M2550 Pain in unspecified joint: Secondary | ICD-10-CM | POA: Diagnosis not present

## 2024-07-16 NOTE — Progress Notes (Signed)
   Subjective:    Patient ID: Jacob Curry, male    DOB: Dec 25, 1940, 83 y.o.   MRN: [id number]  HPI Polyarthralgia- pt reports sxs resolved w/ prednisone  tapers.  pt feels that sxs may be related to switching to [place].  [facility] told him to stop taking Synjardy for a week.  He feels that sxs are '50% better'.  Continues to take ibuprofen  3x/day.  Having pain in shoulders, hips, knees.  Did have multiple tick bites.  Not currently on diabetes medication for 1 week.  ESR was WNL which makes PMR much less likely.  Has not had ANA   Review of Systems For ROS see HPI     Objective:   Physical Exam Vitals reviewed.  Constitutional:      General: He is not in acute distress.    Appearance: Normal appearance. He is not ill-appearing.  HENT:     Head: Normocephalic and atraumatic.  Eyes:     Extraocular Movements: Extraocular movements intact.     Conjunctiva/sclera: Conjunctivae normal.  Cardiovascular:     Rate and Rhythm: Normal rate and regular rhythm.  Musculoskeletal:        General: No swelling or deformity.  Skin:    General: Skin is warm and dry.  Neurological:     General: No focal deficit present.     Mental Status: He is alert and oriented to person, place, and time.  Psychiatric:        Mood and Affect: Mood normal.        Behavior: Behavior normal.        Thought Content: Thought content normal.           Assessment & Plan:  Polyarthralgia- deteriorated.  Now more joints involved.  Shoulders remain most painful but also having pain in hips and knees.  Sxs resolve w/ Prednisone .  Pt is concerned this is related to switch to [place].  Currently on medication holiday per the [place].  Encouraged him to stay off meds for another week and see how he feels.  He also notes that he had multiple tick bites. Will check labs to look for Lyme.  Will also get ANA and Rheumatoid factor in case vaccines triggered autoimmune response.  Will hold off on additional prednisone  at this time  since he is holding DM meds in hopes of symptom improvement.  Do not want to confuse the picture.

## 2024-07-16 NOTE — Patient Instructions (Signed)
 Follow up in 2 weeks to recheck sugars and labs We'll notify you of your lab results and make any changes if needed Continue to HOLD the Syndjardy for 7-10 days Ibuprofen  as needed Call with any questions or concerns Hang in there!!!

## 2024-07-18 LAB — RHEUMATOID FACTOR: Rheumatoid fact SerPl-aCnc: <10 [IU]/mL (ref <14)

## 2024-07-18 LAB — ANA: Anti Nuclear Antibody (ANA): NEGATIVE

## 2024-07-19 ENCOUNTER — Ambulatory Visit: Payer: Self-pay | Admitting: Family Medicine

## 2024-07-19 DIAGNOSIS — M2550 Pain in unspecified joint: Secondary | ICD-10-CM

## 2024-07-19 LAB — LYME DISEASE SEROLOGY W/REFLEX: Lyme Total Antibody EIA: NEGATIVE

## 2024-07-20 NOTE — Progress Notes (Signed)
 Called patient and read your message below. He does not have a preference. He asked about [place] and how there is an office over there that is close to him. That would be ideal. Patient is not feeling better.

## 2024-07-21 ENCOUNTER — Other Ambulatory Visit: Payer: Self-pay

## 2024-07-21 DIAGNOSIS — M2550 Pain in unspecified joint: Secondary | ICD-10-CM

## 2024-07-21 NOTE — Addendum Note (Signed)
 Addended by: Arnez Stoneking E on: 07/21/2024 04:07 PM   Modules accepted: Orders

## 2024-07-22 ENCOUNTER — Ambulatory Visit: Admitting: Sports Medicine

## 2024-07-26 ENCOUNTER — Encounter: Payer: Self-pay | Admitting: Family Medicine

## 2024-07-26 ENCOUNTER — Other Ambulatory Visit (HOSPITAL_BASED_OUTPATIENT_CLINIC_OR_DEPARTMENT_OTHER): Payer: Self-pay

## 2024-07-26 MED ORDER — PREDNISONE 10 MG PO TABS
ORAL_TABLET | ORAL | 0 refills | Status: AC
  Filled 2024-07-26: qty 18, 9d supply, fill #0

## 2024-07-26 MED ORDER — SYNJARDY 12.5-500 MG PO TABS: 1.0000 | ORAL_TABLET | Freq: Two times a day (BID) | ORAL | 1 refills | Status: DC

## 2024-07-26 NOTE — Addendum Note (Signed)
 Addended by: SHARA BASCOM RAMAN on: 07/26/2024 11:28 AM   Modules accepted: Orders

## 2024-07-26 NOTE — Addendum Note (Signed)
 Addended by: Beatric Fulop E on: 07/26/2024 03:43 PM   Modules accepted: Orders

## 2024-08-04 ENCOUNTER — Other Ambulatory Visit (HOSPITAL_BASED_OUTPATIENT_CLINIC_OR_DEPARTMENT_OTHER): Payer: Self-pay

## 2024-08-04 DIAGNOSIS — Z7952 Long term (current) use of systemic steroids: Secondary | ICD-10-CM | POA: Diagnosis not present

## 2024-08-04 DIAGNOSIS — Z6826 Body mass index (BMI) 26.0-26.9, adult: Secondary | ICD-10-CM | POA: Diagnosis not present

## 2024-08-04 DIAGNOSIS — M353 Polymyalgia rheumatica: Secondary | ICD-10-CM | POA: Diagnosis not present

## 2024-08-04 DIAGNOSIS — M25512 Pain in left shoulder: Secondary | ICD-10-CM | POA: Diagnosis not present

## 2024-08-04 DIAGNOSIS — E663 Overweight: Secondary | ICD-10-CM | POA: Diagnosis not present

## 2024-08-04 DIAGNOSIS — M25511 Pain in right shoulder: Secondary | ICD-10-CM | POA: Diagnosis not present

## 2024-08-04 DIAGNOSIS — Z79899 Other long term (current) drug therapy: Secondary | ICD-10-CM | POA: Diagnosis not present

## 2024-08-04 DIAGNOSIS — M2560 Stiffness of unspecified joint, not elsewhere classified: Secondary | ICD-10-CM | POA: Diagnosis not present

## 2024-08-04 MED ORDER — PREDNISONE 5 MG PO TABS
ORAL_TABLET | ORAL | 0 refills | Status: AC
  Filled 2024-08-04: qty 315, 105d supply, fill #0

## 2024-08-05 ENCOUNTER — Encounter: Payer: Self-pay | Admitting: Family Medicine

## 2024-08-05 ENCOUNTER — Other Ambulatory Visit (HOSPITAL_BASED_OUTPATIENT_CLINIC_OR_DEPARTMENT_OTHER): Payer: Self-pay

## 2024-08-05 ENCOUNTER — Ambulatory Visit (INDEPENDENT_AMBULATORY_CARE_PROVIDER_SITE_OTHER): Admitting: Family Medicine

## 2024-08-05 VITALS — BP 128/72 | HR 73 | Temp 98.0°F | Ht 68.0 in | Wt 182.2 lb

## 2024-08-05 DIAGNOSIS — M353 Polymyalgia rheumatica: Secondary | ICD-10-CM

## 2024-08-05 DIAGNOSIS — Z7952 Long term (current) use of systemic steroids: Secondary | ICD-10-CM

## 2024-08-05 NOTE — Progress Notes (Signed)
" ° °  Subjective:    Patient ID: MASUD HOLUB, male    DOB: Dec 06, 1940, 83 y.o.   MRN: [id number]  HPI Shoulder pain- pt reports pain immediately improved w/ Prednisone .  Pt saw Rheum yesterday and was dx'd w/ PMR.  Is going to do a long Prednisone  taper.  Plan is then to either do a biologic or methotrexate.  Needs DEXA.   Review of Systems For ROS see HPI     Objective:   Physical Exam Vitals reviewed.  Constitutional:      General: He is not in acute distress.    Appearance: Normal appearance. He is not ill-appearing.  Skin:    General: Skin is warm and dry.  Neurological:     General: No focal deficit present.     Mental Status: He is alert and oriented to person, place, and time.  Psychiatric:        Mood and Affect: Mood normal.        Behavior: Behavior normal.        Thought Content: Thought content normal.           Assessment & Plan:    "

## 2024-08-05 NOTE — Patient Instructions (Signed)
 Follow up as needed or as scheduled No need for blood work today!  You look great! I'm so glad that you are feeling better! Call with any questions or concerns Stay Safe!  Stay Healthy! Merry Christmas!!

## 2024-08-06 ENCOUNTER — Other Ambulatory Visit (HOSPITAL_BASED_OUTPATIENT_CLINIC_OR_DEPARTMENT_OTHER): Payer: Self-pay

## 2024-08-09 ENCOUNTER — Other Ambulatory Visit (HOSPITAL_BASED_OUTPATIENT_CLINIC_OR_DEPARTMENT_OTHER): Payer: Self-pay | Admitting: Rheumatology

## 2024-08-09 DIAGNOSIS — Z7952 Long term (current) use of systemic steroids: Secondary | ICD-10-CM

## 2024-08-11 ENCOUNTER — Ambulatory Visit: Admitting: Family Medicine

## 2024-08-12 DIAGNOSIS — L570 Actinic keratosis: Secondary | ICD-10-CM | POA: Diagnosis not present

## 2024-08-18 ENCOUNTER — Ambulatory Visit: Payer: Self-pay | Admitting: Family Medicine

## 2024-08-18 ENCOUNTER — Inpatient Hospital Stay (HOSPITAL_BASED_OUTPATIENT_CLINIC_OR_DEPARTMENT_OTHER): Admission: RE | Admit: 2024-08-18 | Discharge: 2024-08-18 | Attending: Rheumatology | Admitting: Rheumatology

## 2024-08-18 DIAGNOSIS — M8588 Other specified disorders of bone density and structure, other site: Secondary | ICD-10-CM | POA: Insufficient documentation

## 2024-08-18 DIAGNOSIS — M85851 Other specified disorders of bone density and structure, right thigh: Secondary | ICD-10-CM | POA: Insufficient documentation

## 2024-08-18 DIAGNOSIS — M85852 Other specified disorders of bone density and structure, left thigh: Secondary | ICD-10-CM | POA: Diagnosis not present

## 2024-08-18 DIAGNOSIS — Z7952 Long term (current) use of systemic steroids: Secondary | ICD-10-CM | POA: Diagnosis not present

## 2024-09-01 DIAGNOSIS — M353 Polymyalgia rheumatica: Secondary | ICD-10-CM | POA: Insufficient documentation

## 2024-09-01 NOTE — Assessment & Plan Note (Signed)
 New.  Rheum confirmed my suspicions and he is now on a long prednisone  taper.  Due to use of steroids needs baseline DEXA- ordered.  Will follow.

## 2024-12-09 ENCOUNTER — Ambulatory Visit: Admitting: Cardiology

## 2024-12-29 ENCOUNTER — Ambulatory Visit: Admitting: Family Medicine
